# Patient Record
Sex: Female | Born: 1965
Health system: Southern US, Community
[De-identification: ages and names within clinical notes are randomized; demographics above are authoritative.]

## PROBLEM LIST (undated history)

## (undated) DIAGNOSIS — I7300 Raynaud's syndrome without gangrene: Secondary | ICD-10-CM

## (undated) DIAGNOSIS — F32A Depression, unspecified: Secondary | ICD-10-CM

## (undated) DIAGNOSIS — F329 Major depressive disorder, single episode, unspecified: Secondary | ICD-10-CM

## (undated) DIAGNOSIS — F419 Anxiety disorder, unspecified: Secondary | ICD-10-CM

## (undated) DIAGNOSIS — G970 Cerebrospinal fluid leak from spinal puncture: Secondary | ICD-10-CM

## (undated) DIAGNOSIS — M62838 Other muscle spasm: Secondary | ICD-10-CM

## (undated) DIAGNOSIS — IMO0002 Reserved for concepts with insufficient information to code with codable children: Secondary | ICD-10-CM

## (undated) DIAGNOSIS — K648 Other hemorrhoids: Secondary | ICD-10-CM

## (undated) DIAGNOSIS — M549 Dorsalgia, unspecified: Secondary | ICD-10-CM

## (undated) DIAGNOSIS — T7840XA Allergy, unspecified, initial encounter: Secondary | ICD-10-CM

## (undated) DIAGNOSIS — R202 Paresthesia of skin: Secondary | ICD-10-CM

## (undated) DIAGNOSIS — J189 Pneumonia, unspecified organism: Secondary | ICD-10-CM

## (undated) DIAGNOSIS — M2540 Effusion, unspecified joint: Secondary | ICD-10-CM

## (undated) DIAGNOSIS — R3915 Urgency of urination: Secondary | ICD-10-CM

## (undated) DIAGNOSIS — R011 Cardiac murmur, unspecified: Secondary | ICD-10-CM

## (undated) DIAGNOSIS — M1990 Unspecified osteoarthritis, unspecified site: Secondary | ICD-10-CM

## (undated) DIAGNOSIS — G8929 Other chronic pain: Secondary | ICD-10-CM

## (undated) DIAGNOSIS — N3010 Interstitial cystitis (chronic) without hematuria: Secondary | ICD-10-CM

## (undated) DIAGNOSIS — M797 Fibromyalgia: Secondary | ICD-10-CM

## (undated) DIAGNOSIS — C801 Malignant (primary) neoplasm, unspecified: Secondary | ICD-10-CM

## (undated) DIAGNOSIS — R51 Headache: Secondary | ICD-10-CM

## (undated) DIAGNOSIS — M2550 Pain in unspecified joint: Secondary | ICD-10-CM

## (undated) DIAGNOSIS — Z8709 Personal history of other diseases of the respiratory system: Secondary | ICD-10-CM

## (undated) DIAGNOSIS — R350 Frequency of micturition: Secondary | ICD-10-CM

## (undated) DIAGNOSIS — K219 Gastro-esophageal reflux disease without esophagitis: Secondary | ICD-10-CM

## (undated) DIAGNOSIS — M329 Systemic lupus erythematosus, unspecified: Secondary | ICD-10-CM

## (undated) DIAGNOSIS — R200 Anesthesia of skin: Secondary | ICD-10-CM

## (undated) DIAGNOSIS — K5900 Constipation, unspecified: Secondary | ICD-10-CM

## (undated) HISTORY — PX: ABDOMINAL HERNIA REPAIR: SHX539

## (undated) HISTORY — PX: SHOULDER SURGERY: SHX246

## (undated) HISTORY — DX: Raynaud's syndrome without gangrene: I73.00

## (undated) HISTORY — PX: DIAGNOSTIC LAPAROSCOPY: SUR761

## (undated) HISTORY — PX: APPENDECTOMY: SHX54

## (undated) HISTORY — PX: OTHER SURGICAL HISTORY: SHX169

## (undated) HISTORY — PX: OVARIAN CYST REMOVAL: SHX89

## (undated) HISTORY — PX: COLONOSCOPY: SHX174

## (undated) HISTORY — PX: TUBAL LIGATION: SHX77

## (undated) HISTORY — PX: VAGINAL PROLAPSE REPAIR: SHX830

## (undated) HISTORY — PX: TONSILLECTOMY: SUR1361

## (undated) HISTORY — PX: REPAIR OF RECTAL PROLAPSE: SHX6420

## (undated) HISTORY — DX: Allergy, unspecified, initial encounter: T78.40XA

## (undated) HISTORY — PX: CHOLECYSTECTOMY: SHX55

## (undated) HISTORY — PX: BLADDER SUSPENSION: SHX72

## (undated) HISTORY — PX: SPINAL FUSION: SHX223

## (undated) HISTORY — PX: ENDOMETRIAL ABLATION: SHX621

## (undated) HISTORY — PX: NASAL SEPTUM SURGERY: SHX37

---

## 1998-02-10 ENCOUNTER — Encounter: Admission: RE | Admit: 1998-02-10 | Discharge: 1998-02-10 | Payer: Self-pay | Admitting: Hematology and Oncology

## 2000-08-16 ENCOUNTER — Ambulatory Visit (HOSPITAL_BASED_OUTPATIENT_CLINIC_OR_DEPARTMENT_OTHER): Admission: RE | Admit: 2000-08-16 | Discharge: 2000-08-16 | Payer: Self-pay | Admitting: Orthopedic Surgery

## 2000-08-28 ENCOUNTER — Encounter: Admission: RE | Admit: 2000-08-28 | Discharge: 2000-11-02 | Payer: Self-pay | Admitting: Orthopedic Surgery

## 2001-02-15 ENCOUNTER — Emergency Department (HOSPITAL_COMMUNITY): Admission: EM | Admit: 2001-02-15 | Discharge: 2001-02-15 | Payer: Self-pay | Admitting: Emergency Medicine

## 2001-03-13 ENCOUNTER — Encounter (HOSPITAL_COMMUNITY): Admission: RE | Admit: 2001-03-13 | Discharge: 2001-04-12 | Payer: Self-pay | Admitting: Unknown Physician Specialty

## 2001-04-24 ENCOUNTER — Emergency Department (HOSPITAL_COMMUNITY): Admission: EM | Admit: 2001-04-24 | Discharge: 2001-04-24 | Payer: Self-pay | Admitting: Emergency Medicine

## 2001-07-24 ENCOUNTER — Emergency Department (HOSPITAL_COMMUNITY): Admission: EM | Admit: 2001-07-24 | Discharge: 2001-07-24 | Payer: Self-pay | Admitting: Internal Medicine

## 2001-07-24 ENCOUNTER — Encounter: Payer: Self-pay | Admitting: Emergency Medicine

## 2002-12-07 ENCOUNTER — Inpatient Hospital Stay (HOSPITAL_COMMUNITY): Admission: AD | Admit: 2002-12-07 | Discharge: 2002-12-07 | Payer: Self-pay | Admitting: *Deleted

## 2007-06-13 ENCOUNTER — Emergency Department (HOSPITAL_COMMUNITY): Admission: EM | Admit: 2007-06-13 | Discharge: 2007-06-13 | Payer: Self-pay | Admitting: Emergency Medicine

## 2007-11-26 ENCOUNTER — Emergency Department (HOSPITAL_COMMUNITY): Admission: EM | Admit: 2007-11-26 | Discharge: 2007-11-26 | Payer: Self-pay | Admitting: Emergency Medicine

## 2008-03-26 ENCOUNTER — Emergency Department (HOSPITAL_COMMUNITY): Admission: EM | Admit: 2008-03-26 | Discharge: 2008-03-26 | Payer: Self-pay | Admitting: Emergency Medicine

## 2008-04-24 ENCOUNTER — Emergency Department (HOSPITAL_BASED_OUTPATIENT_CLINIC_OR_DEPARTMENT_OTHER): Admission: EM | Admit: 2008-04-24 | Discharge: 2008-04-24 | Payer: Self-pay | Admitting: Emergency Medicine

## 2008-06-26 ENCOUNTER — Ambulatory Visit: Payer: Self-pay | Admitting: Gastroenterology

## 2008-07-04 ENCOUNTER — Encounter (HOSPITAL_COMMUNITY): Admission: RE | Admit: 2008-07-04 | Discharge: 2008-07-21 | Payer: Self-pay | Admitting: Gastroenterology

## 2008-08-06 ENCOUNTER — Ambulatory Visit (HOSPITAL_COMMUNITY): Admission: RE | Admit: 2008-08-06 | Discharge: 2008-08-06 | Payer: Self-pay | Admitting: Family Medicine

## 2008-08-25 ENCOUNTER — Encounter (INDEPENDENT_AMBULATORY_CARE_PROVIDER_SITE_OTHER): Payer: Self-pay | Admitting: Surgery

## 2008-08-25 ENCOUNTER — Ambulatory Visit (HOSPITAL_COMMUNITY): Admission: RE | Admit: 2008-08-25 | Discharge: 2008-08-25 | Payer: Self-pay | Admitting: Surgery

## 2008-08-26 ENCOUNTER — Encounter: Payer: Self-pay | Admitting: Gastroenterology

## 2008-08-26 DIAGNOSIS — K319 Disease of stomach and duodenum, unspecified: Secondary | ICD-10-CM | POA: Insufficient documentation

## 2008-09-11 ENCOUNTER — Ambulatory Visit: Payer: Self-pay | Admitting: Gastroenterology

## 2008-09-11 ENCOUNTER — Ambulatory Visit (HOSPITAL_COMMUNITY): Admission: RE | Admit: 2008-09-11 | Discharge: 2008-09-11 | Payer: Self-pay | Admitting: Gastroenterology

## 2008-10-21 ENCOUNTER — Encounter: Admission: RE | Admit: 2008-10-21 | Discharge: 2008-10-23 | Payer: Self-pay | Admitting: Neurosurgery

## 2008-10-24 ENCOUNTER — Encounter: Admission: RE | Admit: 2008-10-24 | Discharge: 2008-12-24 | Payer: Self-pay | Admitting: Neurosurgery

## 2008-11-03 ENCOUNTER — Encounter: Admission: RE | Admit: 2008-11-03 | Discharge: 2009-02-01 | Payer: Self-pay | Admitting: Anesthesiology

## 2008-11-04 ENCOUNTER — Ambulatory Visit: Payer: Self-pay | Admitting: Anesthesiology

## 2008-11-06 ENCOUNTER — Encounter: Payer: Self-pay | Admitting: Gastroenterology

## 2008-11-11 ENCOUNTER — Encounter: Admission: RE | Admit: 2008-11-11 | Discharge: 2008-11-11 | Payer: Self-pay | Admitting: Surgery

## 2008-11-19 ENCOUNTER — Encounter
Admission: RE | Admit: 2008-11-19 | Discharge: 2008-11-20 | Payer: Self-pay | Admitting: Physical Medicine & Rehabilitation

## 2008-11-20 ENCOUNTER — Ambulatory Visit: Payer: Self-pay | Admitting: Physical Medicine & Rehabilitation

## 2008-11-21 ENCOUNTER — Encounter: Payer: Self-pay | Admitting: Gastroenterology

## 2008-12-16 ENCOUNTER — Ambulatory Visit: Payer: Self-pay | Admitting: Anesthesiology

## 2009-02-19 ENCOUNTER — Encounter: Admission: RE | Admit: 2009-02-19 | Discharge: 2009-02-24 | Payer: Self-pay | Admitting: Anesthesiology

## 2009-02-24 ENCOUNTER — Ambulatory Visit: Payer: Self-pay | Admitting: Anesthesiology

## 2009-03-10 ENCOUNTER — Emergency Department (HOSPITAL_BASED_OUTPATIENT_CLINIC_OR_DEPARTMENT_OTHER): Admission: EM | Admit: 2009-03-10 | Discharge: 2009-03-10 | Payer: Self-pay | Admitting: Emergency Medicine

## 2009-03-15 ENCOUNTER — Emergency Department (HOSPITAL_BASED_OUTPATIENT_CLINIC_OR_DEPARTMENT_OTHER): Admission: EM | Admit: 2009-03-15 | Discharge: 2009-03-15 | Payer: Self-pay | Admitting: Emergency Medicine

## 2009-03-15 ENCOUNTER — Ambulatory Visit: Payer: Self-pay | Admitting: Radiology

## 2009-03-26 ENCOUNTER — Emergency Department (HOSPITAL_BASED_OUTPATIENT_CLINIC_OR_DEPARTMENT_OTHER): Admission: EM | Admit: 2009-03-26 | Discharge: 2009-03-26 | Payer: Self-pay | Admitting: Emergency Medicine

## 2009-04-10 ENCOUNTER — Encounter
Admission: RE | Admit: 2009-04-10 | Discharge: 2009-04-22 | Payer: Self-pay | Admitting: Physical Medicine and Rehabilitation

## 2009-04-13 ENCOUNTER — Ambulatory Visit: Payer: Self-pay | Admitting: Physical Medicine and Rehabilitation

## 2009-04-14 ENCOUNTER — Emergency Department (HOSPITAL_COMMUNITY): Admission: EM | Admit: 2009-04-14 | Discharge: 2009-04-14 | Payer: Self-pay | Admitting: Emergency Medicine

## 2009-04-22 ENCOUNTER — Ambulatory Visit: Payer: Self-pay | Admitting: Physical Medicine and Rehabilitation

## 2009-10-26 ENCOUNTER — Inpatient Hospital Stay (HOSPITAL_COMMUNITY): Admission: RE | Admit: 2009-10-26 | Discharge: 2009-10-31 | Payer: Self-pay | Admitting: Neurosurgery

## 2009-10-26 ENCOUNTER — Ambulatory Visit: Payer: Self-pay | Admitting: Vascular Surgery

## 2010-06-02 ENCOUNTER — Encounter: Admission: RE | Admit: 2010-06-02 | Discharge: 2010-07-26 | Payer: Self-pay | Admitting: Neurosurgery

## 2010-11-26 ENCOUNTER — Emergency Department (HOSPITAL_COMMUNITY)
Admission: EM | Admit: 2010-11-26 | Discharge: 2010-11-26 | Disposition: A | Discharge status: Home / Self Care | Payer: Medicaid Other | Attending: Emergency Medicine | Admitting: Emergency Medicine

## 2010-11-26 ENCOUNTER — Encounter (HOSPITAL_COMMUNITY): Payer: Self-pay | Admitting: Radiology

## 2010-11-26 ENCOUNTER — Emergency Department (HOSPITAL_COMMUNITY): Payer: Medicaid Other

## 2010-11-26 DIAGNOSIS — M545 Low back pain, unspecified: Secondary | ICD-10-CM | POA: Insufficient documentation

## 2010-11-26 DIAGNOSIS — F329 Major depressive disorder, single episode, unspecified: Secondary | ICD-10-CM | POA: Insufficient documentation

## 2010-11-26 DIAGNOSIS — M549 Dorsalgia, unspecified: Secondary | ICD-10-CM | POA: Insufficient documentation

## 2010-11-26 DIAGNOSIS — G8929 Other chronic pain: Secondary | ICD-10-CM | POA: Insufficient documentation

## 2010-11-26 DIAGNOSIS — F3289 Other specified depressive episodes: Secondary | ICD-10-CM | POA: Insufficient documentation

## 2010-11-26 MED ORDER — GADOBENATE DIMEGLUMINE 529 MG/ML IV SOLN: 15.0000 mL | Freq: Once | INTRAVENOUS | Status: DC | PRN

## 2011-01-09 LAB — DIFFERENTIAL
Basophils Absolute: 0 10*3/uL (ref 0.0–0.1)
Basophils Relative: 0 % (ref 0–1)
Eosinophils Absolute: 0 10*3/uL (ref 0.0–0.7)
Monocytes Relative: 11 % (ref 3–12)
Neutrophils Relative %: 75 % (ref 43–77)

## 2011-01-09 LAB — CBC
HCT: 28.3 % — ABNORMAL LOW (ref 36.0–46.0)
HCT: 28.5 % — ABNORMAL LOW (ref 36.0–46.0)
HCT: 29 % — ABNORMAL LOW (ref 36.0–46.0)
Hemoglobin: 10 g/dL — ABNORMAL LOW (ref 12.0–15.0)
Hemoglobin: 10.1 g/dL — ABNORMAL LOW (ref 12.0–15.0)
Hemoglobin: 9.7 g/dL — ABNORMAL LOW (ref 12.0–15.0)
MCHC: 34.1 g/dL (ref 30.0–36.0)
MCHC: 35.2 g/dL (ref 30.0–36.0)
Platelets: 219 10*3/uL (ref 150–400)
Platelets: 233 10*3/uL (ref 150–400)
RBC: 3.03 MIL/uL — ABNORMAL LOW (ref 3.87–5.11)
RBC: 3.04 MIL/uL — ABNORMAL LOW (ref 3.87–5.11)
RBC: 3.1 MIL/uL — ABNORMAL LOW (ref 3.87–5.11)
RDW: 12.6 % (ref 11.5–15.5)
RDW: 12.8 % (ref 11.5–15.5)
WBC: 9 10*3/uL (ref 4.0–10.5)
WBC: 9.3 10*3/uL (ref 4.0–10.5)

## 2011-01-09 LAB — URINE MICROSCOPIC-ADD ON

## 2011-01-09 LAB — URINALYSIS, ROUTINE W REFLEX MICROSCOPIC
Bilirubin Urine: NEGATIVE
Glucose, UA: NEGATIVE mg/dL
Specific Gravity, Urine: 1.015 (ref 1.005–1.030)
Urobilinogen, UA: 0.2 mg/dL (ref 0.0–1.0)
pH: 7.5 (ref 5.0–8.0)

## 2011-01-09 LAB — URINE CULTURE

## 2011-01-09 LAB — CULTURE, BLOOD (ROUTINE X 2): Culture: NO GROWTH

## 2011-01-09 LAB — BASIC METABOLIC PANEL
CO2: 31 mEq/L (ref 19–32)
Calcium: 8.1 mg/dL — ABNORMAL LOW (ref 8.4–10.5)
GFR calc non Af Amer: >60 mL/min (ref >60)
Sodium: 135 mEq/L (ref 135–145)

## 2011-01-12 ENCOUNTER — Ambulatory Visit: Payer: Medicaid Other | Attending: Orthopedic Surgery | Admitting: Physical Therapy

## 2011-01-12 DIAGNOSIS — R5381 Other malaise: Secondary | ICD-10-CM | POA: Insufficient documentation

## 2011-01-12 DIAGNOSIS — IMO0001 Reserved for inherently not codable concepts without codable children: Secondary | ICD-10-CM | POA: Insufficient documentation

## 2011-01-12 DIAGNOSIS — M6281 Muscle weakness (generalized): Secondary | ICD-10-CM | POA: Insufficient documentation

## 2011-01-12 DIAGNOSIS — M25519 Pain in unspecified shoulder: Secondary | ICD-10-CM | POA: Insufficient documentation

## 2011-01-20 ENCOUNTER — Ambulatory Visit: Payer: Medicaid Other | Attending: Orthopedic Surgery | Admitting: Physical Therapy

## 2011-01-20 DIAGNOSIS — M6281 Muscle weakness (generalized): Secondary | ICD-10-CM | POA: Insufficient documentation

## 2011-01-20 DIAGNOSIS — R5381 Other malaise: Secondary | ICD-10-CM | POA: Insufficient documentation

## 2011-01-20 DIAGNOSIS — M25519 Pain in unspecified shoulder: Secondary | ICD-10-CM | POA: Insufficient documentation

## 2011-01-20 DIAGNOSIS — IMO0001 Reserved for inherently not codable concepts without codable children: Secondary | ICD-10-CM | POA: Insufficient documentation

## 2011-01-24 ENCOUNTER — Ambulatory Visit: Payer: Medicaid Other | Admitting: Physical Therapy

## 2011-01-24 LAB — CBC
HCT: 39.3 % (ref 36.0–46.0)
Hemoglobin: 13.7 g/dL (ref 12.0–15.0)
MCHC: 35 g/dL (ref 30.0–36.0)
Platelets: 308 10*3/uL (ref 150–400)
RDW: 12.8 % (ref 11.5–15.5)

## 2011-01-24 LAB — BASIC METABOLIC PANEL
BUN: 4 mg/dL — ABNORMAL LOW (ref 6–23)
CO2: 28 mEq/L (ref 19–32)
Calcium: 9.6 mg/dL (ref 8.4–10.5)
Glucose, Bld: 79 mg/dL (ref 70–99)
Potassium: 4.5 mEq/L (ref 3.5–5.1)
Sodium: 137 mEq/L (ref 135–145)

## 2011-02-01 ENCOUNTER — Ambulatory Visit: Payer: Medicaid Other | Admitting: Physical Therapy

## 2011-02-22 ENCOUNTER — Other Ambulatory Visit: Payer: Self-pay | Admitting: Surgery

## 2011-02-22 ENCOUNTER — Encounter (HOSPITAL_COMMUNITY): Payer: Medicaid Other | Attending: Surgery

## 2011-02-22 DIAGNOSIS — Z79899 Other long term (current) drug therapy: Secondary | ICD-10-CM | POA: Insufficient documentation

## 2011-02-22 DIAGNOSIS — Z01812 Encounter for preprocedural laboratory examination: Secondary | ICD-10-CM | POA: Insufficient documentation

## 2011-02-22 DIAGNOSIS — K439 Ventral hernia without obstruction or gangrene: Secondary | ICD-10-CM | POA: Insufficient documentation

## 2011-02-22 LAB — SURGICAL PCR SCREEN: Staphylococcus aureus: INVALID — AB

## 2011-02-25 LAB — MRSA CULTURE

## 2011-03-02 ENCOUNTER — Ambulatory Visit (HOSPITAL_COMMUNITY)
Admission: RE | Admit: 2011-03-02 | Discharge: 2011-03-02 | Disposition: A | Discharge status: Home / Self Care | Payer: Medicaid Other | Source: Ambulatory Visit | Attending: Surgery | Admitting: Surgery

## 2011-03-02 DIAGNOSIS — K432 Incisional hernia without obstruction or gangrene: Secondary | ICD-10-CM | POA: Insufficient documentation

## 2011-03-02 DIAGNOSIS — M549 Dorsalgia, unspecified: Secondary | ICD-10-CM | POA: Insufficient documentation

## 2011-03-02 DIAGNOSIS — G8929 Other chronic pain: Secondary | ICD-10-CM | POA: Insufficient documentation

## 2011-03-04 NOTE — Op Note (Signed)
  [name], [name]                ACCOUNT NO.:  [id number]  MEDICAL RECORD NO.:  [id number]           PATIENT TYPE:  O  LOCATION:  DAYL                         FACILITY:  [facility]  PHYSICIAN:  Thornton Park. Daphine Deutscher, MD  DATE OF BIRTH:  10-24-1966  DATE OF PROCEDURE:  03/02/2011 DATE OF DISCHARGE:  03/02/2011                              OPERATIVE REPORT   PREOPERATIVE DIAGNOSIS:  Upper midline trocar site hernia from previous lap chole.  PROCEDURE:  Laparoscopic assisted ventral hernia repair using Proceed ventral patch.  OPERATIVE FINDINGS:  A 1-cm trocar site hernia with balloon mushroom type sac containing properitoneal fat.  Inspection of the periduodenal region which I had seen some whitish discoloration before was reinspected and no abnormalities were noted.  PROCEDURE:  Lap-assisted ventral repair.  SURGEON:  Thornton Park. Daphine Deutscher, MD  ASSISTANT:  None.  ANESTHESIA:  General.  DESCRIPTION OF PROCEDURE:  Maureen [name] was taken to room #1 on the afternoon of Mar 02, 2011, and given general anesthesia.  The abdomen was prepped with PCMX and draped sterilely.  Time-out was performed.  Access to the abdomen was achieved using a 5-mm 0-degree Optiview entering without difficulty.  Insufflation was performed and angle degree 5 mm scope was used.  Two ties were placed in lower abdomen and these assisted me in grasping the hernia sac and reducing into the abdomen. With that, I elected to go ahead and debulked this by kind of pulling some of the herniated fat material out.  Once this was done, it did demarcate a small opening probably about 1 cm in diameter.  I went ahead and took the smallest Proceed patch and excised the old little trocar incision.  I then dissected free the hernia sac revealing the little slit in the fascia.  Through that I went ahead and inserted the Proceed patch and sutured it around with 5-0 Prolene pop-offs to the surrounding fascia.  I then went in with the  secured strap tacker and tacked the periphery of this patch up to the anterior abdominal wall.  The patch looked good in its position and was completely deployed to more than adequately cover this defect.  Wounds were injected with Marcaine and closed with 4-0 Vicryl and Dermabond.  The patient seemed to tolerate the procedure well.  She was given Percocet for pain and will follow up in the office in about 3 weeks.     Thornton Park Daphine Deutscher, MD     MBM/MEDQ  D:  03/02/2011  T:  03/02/2011  Job:  [id number]  Electronically Signed by Luretha Murphy MD on 03/04/2011 08:36:25 AM
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
## 2011-03-08 NOTE — Group Therapy Note (Signed)
PATIENT:  Graylin Shiver   DATE OF BIRTH:  July 10, 1966   DATE:   SURGEON:  Jewel Baize. Stevphen Rochester, M.D.   [facility] and I  evaluated her.  I reviewed the Health and History form.  I reviewed the  14-point review of systems.   A 45 year old complaining of low back pain, referred to [place] for  management of low back, right leg pain.  Describes no specific onset, or  traumatic event, but has had escalating pain over the past number of  months.  She has seen our neurosurgical colleagues and states  nonsurgical, ask for conservative and interventional management  approach.  On subjective scale, 6/10.  Interval obs with her restorative  sleep capacity and most of her functional activities.  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  She  walks without assistance, does not divulge any bowel or bladder  disorder, difficulty with most endurance activities, however.  She has  children at home, and this interferes with her capacity to work with  them as well.   Fourteen-point review of systems, some escalating weight gain, GERD.   CURRENT MEDICATIONS:   ALLERGIES:  States no wish to harm self or others.   PSH:  Noncontributory.   PMH:  Otherwise unremarkable.   FAMILY HISTORY:  Remarkable for hypertension.  Denies chemical alcohol  dependency.   SOCIAL HISTORY:  She states she is a previous smoker, recently quit.   PHYSICAL EXAM:  Reveals a pleasant overweight female sitting comfortably  in bed with normal vital signs.  Gait, slight limp.  Oriented times  three.  Affect and appearance is normal.  HEENT:  Unremarkable.  CHEST:  Clear to auscultation and percussion.  Increased AP diameter.  CARDIAC:  Regular rate and rhythm without rub, murmur or gallop.  ABDOMINAL EXAM:  Soft, nontender, benign, no hepatosplenomegaly.  MUSCULOSKELETAL:  Diffuse paralumbar myofascial discomfort with modified  Gaenslen's and Patrick's positive particularly to the right.  Pain with  extension.  No EHL give way, and she has an intact neurological  assessment.   IMPRESSION:  Degenerative spine disease, lumbar spine with facet  syndrome.  Spondylitic pain.  MRI reviewed.   PROCEDURE:   PLAN:  1. Recommend facet medial branch intervention with RF as an option.      Bench mark should be obtained, reviewed with the patient, I talked      about her Dr. Wynn Banker about going forward this block as she      apparently has some limited weeks left on her insurance and I do      think this will help her.  Level of interrupted activities of daily      living warrants this as we do not want to escalate controlled      substances.  Particularly as she is in school, takes care of young      children.  2. To this end, I am going to switch her to hydrocodone [place] and      medications attached chart will go to Ultram ER and I will review      that with her.  3. Follow up with her for this block.  I have used models discussed in      lay terms.  Risks, complications, and options were fully outlined.  Questions were answered.           ______________________________  Celene Kras, MD     HH/MedQ  D:  11/04/2008 10:24:43  T:  11/04/2008 23:35:32  Job #:  [id number]

## 2011-03-08 NOTE — Assessment & Plan Note (Signed)
[name]  I evaluated  her and reviewed the health and history form and 14-point review of  systems.   Maureen [name] has had a facet intervention with a number of days of relief  cycling, but has had recrudescence.  I think it is probably reasonable  to consider her for another intervention, but she started having more  leg pain, somewhat radicular in nature, and so I think that I will do a  trial of translaminar intervention.  Also, the intragluteal pain, exam,  and historical features suggest that side.  She has ran out of insurance  and notices she is going to have to go to cash, so we will work with it.  I have discussed treatment options with her.   OBJECTIVE:  Diffuse paralumbar myofascial discomfort.  Fortin test is  positive with side bending.  Modified gaze and Patrick's positive, left  greater than right.  A mixed radicular spondylitic pain, SI overlay.   IMPRESSION:  Degenerative spine disease, lumbar spine; spondylitic joint  pain.   PLAN:  1. I will send her to the office and we will do a lumbar epidural as      consideration to reduce her cost.  2. Other modifiable features in health profile, weight control, I have      reviewed with her, and I talked to her also about other      consideration in this regard.  3. She is getting ready to take her test in [employer] and she states      that she really wants to pass and get into some type of work.  We      will do what we can to work with her.  I have reviewed medications,      options in this regard, no barrier to communication.  Opioid      consent, the patient's care agreement reviewed.  I will go ahead      and give her some hydrocodone, I wondered to emphasize NSAIDs      during the day, hydrocodone if necessary.   We will see her in followup.  Discharge instructions given.           ______________________________  Celene Kras, MD     HH/MedQ  D:  12/16/2008  13:29:34  T:  12/17/2008 01:14:00  Job #:  [id number]

## 2011-03-08 NOTE — Consult Note (Signed)
Maureen [name], Maureen [name]                ACCOUNT NO.:  [id number]   MEDICAL RECORD NO.:  [id number]          PATIENT TYPE:  AMB   LOCATION:  DAY                           FACILITY:  [facility]   PHYSICIAN:  Kassie Mends, M.D.      DATE OF BIRTH:  Feb 22, 1966   DATE OF CONSULTATION:  06/26/2008  DATE OF DISCHARGE:                                 CONSULTATION   REASON FOR CONSULTATION:  Abdominal pain and constipation.   PHYSICIAN REQUESTING CONSULTATION:  Ernestina Penna, MD, [facility].   PHYSICIAN CONSULTING:  Kassie Mends, MD.   HISTORY OF PRESENT ILLNESS:  The patient is a pleasant 45 year old lady  who presents today for further evaluation of constipation and abdominal  pain.  She states she has been having chronic constipation for years.  She has really been having abdominal pain since February of this year.  She was then seen in the emergency department 3 times for this pain.  Pain is in right upper quadrant region.  Does radiate down to the right  mid-abdomen.  Often associated with food, but she notes it is worse when  she takes a laxative or when she has a bowel movement.  It seems to be  worse with stress as well.  She also has a lot of abdominal cramping  whenever she takes a laxative to have a bowel movement.  She has chronic  constipation.  She generally has a bowel movement once a week.  She  takes ex-lax once a week.  She has tried MiraLax and stool softeners  before.  She states she has tried fiber supplements, but she has too  much bloating and discomfort related to this.  She had been using  ibuprofen and Tylenol as an outpatient, but she has now stopped this.  She is concerned the ibuprofen may be messing her stomach up.  She  denies any  nausea or vomiting.  She has had some epigastric burning,  but this is better on Protonix.  She denies any heartburn.  No  dysphagia, odynophagia, or weight loss.  No dysuria or hematuria.  She  has had some very heavy  menses.  She is due to see a gynecologist in the  near future.  In February, she had an abdominal ultrasound, which was  negative.  In June, when she presented to the emergency department, she  had an abdominal ultrasound and a CT of the abdomen and pelvis both were  negative.  Lab work consists of MET-7, CMET, and CBC all normal on April 24, 2008.  Urinalysis negative as well.  Lab test was normal in June.  She also had a normal chest x-ray as well.   CURRENT MEDICATIONS:  1. Prozac 40 mg daily.  2. Xanax 0.5 mg daily.  3. Protonix 40 mg daily.   ALLERGIES:  PENICILLIN causes rash.   PAST MEDICAL HISTORY:  Chronic constipation and depression.   PAST SURGICAL HISTORY:  Cesarean section x2, appendectomy surgery for  adhesions, ovarian cystostomy, and tonsillectomy.   FAMILY HISTORY:  Mother has history of colon  polyps.  Maternal  grandmother has breast cancer and history of colon polyps.   SOCIAL HISTORY:  She is married.  She has 3 children, a daughter age 31  and twin daughters age 31.  She is a Physicist, medical, IT trainer.  She quit smoking 5 months ago.  No alcohol use.   REVIEW OF SYSTEMS:  GI:  See HPI.  CONSTITUTIONAL:  Complains of weight  gain.  CARDIOPULMONARY:  Denies chest pain, shortness of breath,  palpitations, or cough.  GENITOURINARY:  Complains of heavy menses.  GYN:  Appointment scheduled.  No dysuria or hematuria.   PHYSICAL EXAMINATION:  VITAL SIGNS:  Weight 165, height 5 feet 3 inches,  temp 98.6, blood pressure 100/72, and pulse 64.  GENERAL:  A pleasant, well-nourished, well-developed Caucasian female in  no acute distress.  SKIN:  Warm and dry.  No jaundice.  HEENT:  Sclera nonicteric.  Oropharyngeal mucosa moist and pink.  No  lesions, erythema, or exudate.  No lymphadenopathy or thyromegaly.  CHEST:  Lungs are clear to auscultation.  CARDIAC:  Regular rate and rhythm.  Normal S1, S2.  No murmurs, rubs, or  gallops.  ABDOMEN:  Positive bowel  sound.  Abdomen is soft and nondistended.  She  has mild-to-moderate tenderness in the epigastrium and right upper  quadrant region.  She has a positive Murphy sign with increased pain  with inspiration.  No rebound.  No abdominal bruits or hernia.  LOWER EXTREMITIES:  No edema.   IMPRESSION:  The patient is a pleasant 45 year old lady with 25-month  history of right upper quadrant abdominal pain and chronic constipation.  She also has epigastric pain.  Two abdominal ultrasounds have been  negative for acute cholecystitis or gallstones.  LFTs in July were  normal.  She has significant pain on exam today in the right upper  quadrant region.  We need to rule out the possibility of biliary  dyskinesia or chronic cholecystitis.  Differential also includes  possibly a peptic ulcer disease.  She has chronic constipation with  reported failure to over-the-counter stool softeners.  We will giver her  a trial on Amitiza.   PLAN:  1. HIDA scan with fatty meal challenge.  2. High-fiber diet.  3. Drink at least 64 ounces of water daily.  4. Amitiza 8 mcg p.o. b.i.d. with food, #27.  5. Vicodin 5/500 mg 1 p.o. q.6 h. p.r.n. severe pain, #15, 0 refills.  6. If HIDA scan is unremarkable, we would consider possibility of EGD      as a next step.   ADDENDUM 102009:  HIDA c/w biliary dyskinesia. Consider cholecystectomy.      Tana Coast, P.A.      Kassie Mends, M.D.  Electronically Signed    LL/MEDQ  D:  06/26/2008  T:  06/27/2008  Job:  [id number]   cc:   Ernestina Penna, M.D.  Fax: [phone]

## 2011-03-08 NOTE — Assessment & Plan Note (Signed)
Maureen [name] [facility]  I evaluated Maureen [name]  via health and history form 14-point review of systems.   Maureen [name] comes to [place] today complaining of:  1. Multiple myofascial overlay, paralumbar discomfort.  2. 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  I see no reason Maureen [name] cannot look forward to a very positive      gainful employment experience.  3. Modifiable features in health profile, weight control, and home-      based therapy.  4. Maureen [name] is using 2 hydrocodone at a time and that is against prescribed      limits, and I have counseled Maureen [name].  I have gone over the opioid      consent and the patient's care agreement.  I do actually think an      agent shift is probably in Maureen [name] best interest as a pharmacokinetic      long-acting agent might smooth Maureen [name] out.  Maureen [name] has to pay cash for      things and I think Ultram ER will be good choice for [place] as there is      a generic alternative here.  5. This would also help [place] minimize potential escalation-controlled      substances, which we really want to avoid, that is in Maureen [name] best      interest.  6. I also talked to Maureen [name] about treating in a multimodality since I have      offered Maureen [name] interventions in the like and Maureen [name] does not want those.      From a symptom management standpoint, we will follow Maureen [name]      expectantly.   OBJECTIVE:  Diffuse paralumbar myofascial pain, Horton's test positive,  side bending range of motion, impaired secondary to pain.  I did not see  anything new neurologically.   IMPRESSION:  Degenerative spine disease, lumbar spine.   PLAN:  Conservative management.           ______________________________  Celene Kras, MD     HH/MedQ  D:  02/24/2009 10:16:59  T:  02/24/2009 16:10:96  Job #:  [id number]

## 2011-03-08 NOTE — Op Note (Signed)
**Note Maureen via Obfuscation** [name], GLANZ                ACCOUNT NO.:  [id number]   MEDICAL RECORD NO.:  [id number]          PATIENT TYPE:  AMB   LOCATION:  DAY                          [facility]ACILITY[facility]   PHYSICIAN:  Thornton Park. Daphine Deutscher, MD  DATE OF BIRTH:  02-14-1966   DATE OF PROCEDURE:  08/25/2008  DATE OF DISCHARGE:                               OPERATIVE REPORT   PREOPERATIVE DIAGNOSIS:  Biliary dyskinesia.   POSTOPERATIVE DIAGNOSIS:  Biliary dyskinesia.   PROCEDURE:  Laparoscopic cholecystectomy with intraoperative  cholangiogram.   SURGEON:  Thornton Park.  Daphine Deutscher, M.D.   ASSISTANT:  None.   ANESTHESIA:  General endotracheal.   DESCRIPTION OF PROCEDURE:  Maureen [name] is a 45 year old white female  taken to room 6 on Monday, August 25, 2008 and given general  anesthesia.  The abdomen was prepped with chlorhexidine and draped  sterilely.  I excised her previous transverse incision and the umbilicus  and went down and did a Hassan technique without difficulty.  The  abdomen was insufflated.  Three trocars were placed in the upper  abdomen.  In doing this twice, she blocked down to a heart rate of 25  and received atropine.  The gallbladder was grasped, elevated and I had  dissected free Calot's triangle carefully getting a critical view.  The  cystic artery actually came anteriorly on top of the gallbladder, but I  separated it away and then clipped it proximally.  I did not divide it,  however.  I went ahead and put a clip upon the gallbladder and incised  the cystic duct, inserted a Reddick catheter, and took a dynamic  cholangiogram which showed good filling of the common duct with  intrahepatic radicals and free flow into the duodenum.  The cystic duct  was then triple clipped and divided.  The cystic artery was then  completely triple clipped and divided and the gallbladder was removed  from the gallbladder bed.  I did enter it up near the fundus spilling a  little bit of bile, but controlled  that and then I brought the  gallbladder out with the help of a bag through the umbilicus.  I went  back and irrigated with a total of 2 liters of saline until again it was  clear.  I also looked in the pelvis and did not see anything else there.   In the course of doing the dissection, I did notice that along the  periduodenal region there was a white plaque and it was somewhat sort of  elevated right at the lateral duodenal mesenteric junction.  I  photographed that and the copy is in the chart.  I am not certain of the  nature of this, but felt that I would not try to incise that or create a  potential complication, but would look at this first endoscopically to  make sure she had a full upper GI endoscopic workup before looking at  this area in light of her pain syndrome.  Furthermore, it would be  interesting to see how she does after the lap cholecystectomy to see if  this does relieve her pain.   At the end, everything appeared to be in order.  No bleeding or bile  leaks were noted.  I injected the port sites with half percent Marcaine  and closed the umbilicus with 0-Vicryl and then removed the trocars.  The wounds were closed with 4-0 Vicryl, Benzoin and Steri-Strips.  The  patient tolerated the procedure well and was taken to the recovery room  satisfactory condition.      Thornton Park Daphine Deutscher, MD  Electronically Signed     MBM/MEDQ  D:  08/25/2008  T:  08/25/2008  Job:  [id number]   cc:   [facility] [place]

## 2011-03-08 NOTE — Procedures (Signed)
Maureen [name], Maureen [name]                ACCOUNT NO.:  [id number]   MEDICAL RECORD NO.:  [id number]           PATIENT TYPE:   LOCATION:                                 FACILITY:   PHYSICIAN:  Erick Colace, M.D.DATE OF BIRTH:  1966-05-25   DATE OF PROCEDURE:  11/20/2008  DATE OF DISCHARGE:                               OPERATIVE REPORT   PROCEDURES:  This is a bilateral L5 dorsal ramus injection, bilateral L4  medial branch block, bilateral L3 medial branch block under fluoroscopic  guidance.   INDICATION:  Lumbar pain, axial which interferes with self-care  mobility, only partial response to medication management including  narcotic analgesic medications.   The patient was placed prone on fluoroscopy table.   Informed consent was obtained after describing risks and benefits of the  procedure with the patient.  These include bleeding, bruising,  infection, temporary, or permanent paralysis.  She elects to proceed and  has given written consent.  The patient was placed prone on fluoroscopy  table.  Betadine prep, sterile drape, 25-gauge 1-1/2 inch needle was  used to anesthetize the skin and subcu tissue with 1% lidocaine x2 mL.  Then, 22 gauge 3-1/2 inch spinal needle was inserted, first starting the  left S1 SAP sacral ala junction bone contact made, confirmed with  lateral imaging.  Omnipaque 180 under live fluoro demonstrated no  intravascular uptake and 0.5 mL of solution containing 1 mL of 4 mg/mL  dexamethasone and 2 mL of 2% MPF lidocaine were injected.  Then, left L5  SAP transverse process junction targeted bone contact made, confirmed  with lateral imaging.  Omnipaque 180 x 0.5 mL demonstrated no  intravascular uptake.  Then, 0.5 mL of dexamethasone lidocaine solution  was injected.  Then, left L4 SAP transverse process junction targeted  bone contact made.  Omnipaque 180 x 0.5 mg demonstrated no intravascular  uptake.  Then, 0.5 mL of dexamethasone lidocaine was  solution injected.  Same procedure was repeated on the right side with corresponding levels,  same injectate technique and needle.  The patient tolerated the  procedure well.  Pre- and post-injection vitals stable.  Post-injections  structures given.      Erick Colace, M.D.  Electronically Signed     AEK/MEDQ  D:  11/20/2008 10:18:13  T:  11/20/2008 22:56:12  Job:  [id number]

## 2011-03-08 NOTE — Assessment & Plan Note (Signed)
[name] is a 45 year old woman, who was referred by Dr. Stevphen [name]  and was first seen by me on April 13, 2009.  She is back in today for a  brief recheck and review of her lumbar MRI results from an MRI, which  was done on April 14, 2009.   She states her average pain is about 8 on a scale of 10 rather constant  in nature, predominantly localized to the lumbosacral junction,  radiating to the right lateral thigh.  Pain is worse with walking,  bending, sitting, standing, and improves with rest, heat therapy, and  medications, reports overall poor sleep.   FUNCTIONAL STATUS:  She can walk 10 minutes at a time.  She can climb  stairs.  She is driving.  She is going to school.  She is independent  with self-care.   Recently seen in the emergency room for low back pain on the April 14, 2009, was given Percocet in the emergency room for back pain.   She does have an appointment Dr. Lovell [name] or some complaints of  cervicalgia, which she mentioned at the last visit as well.  However,  cervicalgia is not her main complaint today in our clinic.  Her main  complaint is low back pain and right lateral hip pain.   REVIEW OF SYSTEMS:  Negative.   No other changes in past medical, social, or family history since her  last visit on April 13, 1009.   Medications provided through this clinic include:  1. Neurontin 300 mg, titrating up to 4 times a day.  2. Tramadol 50 mg one-half to one tablet t.i.d.   PHYSICAL EXAMINATION:  VITAL SIGNS:  Today blood pressure is 141/67,  pulse 81, respirations 18, and 100% saturated on room air.  GENERAL:  She is a mildly obese female, who does not appear in any  distress.  She is oriented x3.  Speech is clear.  Affect is bright.  She  is alert, cooperative, and pleasant.  However, became quite irritable  after discussion on her urine drug screen.  NEUROLOGIC:  Cranial nerves are intact.  Coordination is intact.  Reflexes are 2+ in bilateral upper and lower  extremities.  No abnormal  tone is noted.  No clonus is noted.  No tremors are appreciated.   Her motor strength in the lower extremities is in the 5/5 range with  exception of slightly decreased EHL on the right compared to the left.  She reports sensory deficits in the right lateral thigh.   Her straight leg raise is negative.   Transitioning from sitting to standing is done with the slightly  antalgic gait.  Limitations in lumbar extension and forward flexion are  fairly well-preserved with the complaints of pain at end range.  Cervical range of motion is mildly limited.  Full shoulder range of  motion is appreciated.   IMPRESSION:  Lumbar MRIs reviewed with her, noted to have a small  hemangioma at L1.  These were compared with MRI from August 06, 2008.  She had disk space narrowing at L5-S1, vacuum phenomenon at that level,  mild disk bulge, end plate spurring, bilateral foraminal narrowing  unchanged.  Central canal is widely patent.  No nerve root compression  identified.  At L4-5, minimal disk bulging, mild facet degenerative  change, central canal, and neuro foraminal widely patent at that level  as well.   Reviewed MRI with her, answered the questions regarding the MRI.   IMPRESSION:  1. Lumbago with mild degenerative disk disease at L5-S1 and L4-5 as      well as mild facet changes at the same levels.  2. History of cervicalgia.  The patient will follow up with Dr.      Lovell [name] on April 24, 2009, which is in 2 days.  3. Complaints of numbness in right lower extremity, consider elective      diagnostic studies.   PLAN:  I have reviewed the patient's MRI results with her.  She also  mentioned that the medication she currently on is not particularly  helpful in managing her pain.  She was requesting stronger pain  medications.  Urine drug screen was also reviewed with her, which showed  an inconsistent result showing evidence, that she has been taking on  propoxyphene.   She states it was from her mother where she got the  medication from.  We had also done a [facility] check,  which did not show any of her prescribers giving a prescription for  propoxyphene.   Once I reviewed her urine drug screen with her and a medicine to her  that this was inconsistent and against to our controlled substance  agreement, she became quite upset and used some vulgar language and told  me she was not coming back to our clinic.  I indicated to the front desk  that she had asked for discharge.           ______________________________  Brantley Stage, M.D.     DMK/MedQ  D:  04/22/2009 12:39:06  T:  04/23/2009 03:15:12  Job #:  [id number]

## 2011-04-08 ENCOUNTER — Encounter (INDEPENDENT_AMBULATORY_CARE_PROVIDER_SITE_OTHER): Payer: Self-pay | Admitting: Surgery

## 2011-04-29 ENCOUNTER — Encounter (INDEPENDENT_AMBULATORY_CARE_PROVIDER_SITE_OTHER): Payer: Medicaid Other | Admitting: Surgery

## 2011-05-25 HISTORY — PX: VAGINAL HYSTERECTOMY: SUR661

## 2011-05-25 HISTORY — PX: TOTAL VAGINAL HYSTERECTOMY: SHX2548

## 2011-07-15 LAB — CBC
HCT: 36
Hemoglobin: 12.4
MCHC: 34.5
MCV: 92.9
RDW: 12.7

## 2011-07-15 LAB — COMPREHENSIVE METABOLIC PANEL
Alkaline Phosphatase: 38 — ABNORMAL LOW
BUN: 8
Calcium: 9.2
Creatinine, Ser: 0.67
Glucose, Bld: 90
Potassium: 4.1
Total Protein: 6.4

## 2011-07-15 LAB — DIFFERENTIAL
Basophils Absolute: 0
Basophils Relative: 0
Monocytes Relative: 7
Neutro Abs: 3.1
Neutrophils Relative %: 58

## 2011-07-15 LAB — URINALYSIS, ROUTINE W REFLEX MICROSCOPIC
Bilirubin Urine: NEGATIVE
Glucose, UA: NEGATIVE
Hgb urine dipstick: NEGATIVE
Ketones, ur: NEGATIVE
pH: 7.5

## 2011-07-21 LAB — URINALYSIS, ROUTINE W REFLEX MICROSCOPIC
Bilirubin Urine: NEGATIVE
Glucose, UA: NEGATIVE
Glucose, UA: NEGATIVE
Hgb urine dipstick: NEGATIVE
Ketones, ur: NEGATIVE
Nitrite: NEGATIVE
Protein, ur: NEGATIVE
Protein, ur: NEGATIVE
Specific Gravity, Urine: 1.004 — ABNORMAL LOW
Urobilinogen, UA: 0.2
Urobilinogen, UA: 0.2
pH: 7

## 2011-07-21 LAB — COMPREHENSIVE METABOLIC PANEL
ALT: 22
AST: 29
Albumin: 3.9
Albumin: 4.6
Alkaline Phosphatase: 49
BUN: 6
BUN: 11
CO2: 29
Calcium: 9.7
Chloride: 101
Creatinine, Ser: 0.69
Creatinine, Ser: 0.7
GFR calc Af Amer: >60
GFR calc non Af Amer: >60
Glucose, Bld: 79
Potassium: 4.2
Sodium: 140
Total Bilirubin: 0.3
Total Protein: 6.5
Total Protein: 7.9

## 2011-07-21 LAB — DIFFERENTIAL
Basophils Absolute: 0
Basophils Relative: 1
Eosinophils Absolute: 0.1
Eosinophils Absolute: 0.1
Eosinophils Relative: 1
Eosinophils Relative: 1
Lymphocytes Relative: 30
Lymphs Abs: 2
Lymphs Abs: 1.8
Monocytes Absolute: 0.4
Monocytes Relative: 7
Monocytes Relative: 7
Neutro Abs: 3.7
Neutrophils Relative %: 62

## 2011-07-21 LAB — PREGNANCY, URINE
Preg Test, Ur: NEGATIVE
Preg Test, Ur: NEGATIVE

## 2011-07-21 LAB — CBC
HCT: 37.2
HCT: 39.5
Hemoglobin: 13.8
MCHC: 35
MCV: 93.1
MCV: 91
Platelets: 324
RBC: 4
RBC: 4.34
RDW: 11.7
WBC: 5.3
WBC: 6

## 2011-07-26 LAB — PREGNANCY, URINE: Preg Test, Ur: NEGATIVE

## 2011-07-26 LAB — HEMOGLOBIN AND HEMATOCRIT, BLOOD: Hemoglobin: 12.5

## 2011-08-23 ENCOUNTER — Encounter (HOSPITAL_BASED_OUTPATIENT_CLINIC_OR_DEPARTMENT_OTHER): Payer: Self-pay | Admitting: *Deleted

## 2011-08-23 ENCOUNTER — Emergency Department (INDEPENDENT_AMBULATORY_CARE_PROVIDER_SITE_OTHER): Payer: Medicaid Other

## 2011-08-23 ENCOUNTER — Emergency Department (HOSPITAL_BASED_OUTPATIENT_CLINIC_OR_DEPARTMENT_OTHER)
Admission: EM | Admit: 2011-08-23 | Discharge: 2011-08-23 | Disposition: A | Discharge status: Home / Self Care | Payer: Medicaid Other | Attending: Emergency Medicine | Admitting: Emergency Medicine

## 2011-08-23 DIAGNOSIS — K589 Irritable bowel syndrome without diarrhea: Secondary | ICD-10-CM | POA: Insufficient documentation

## 2011-08-23 DIAGNOSIS — R209 Unspecified disturbances of skin sensation: Secondary | ICD-10-CM | POA: Insufficient documentation

## 2011-08-23 DIAGNOSIS — M79609 Pain in unspecified limb: Secondary | ICD-10-CM

## 2011-08-23 DIAGNOSIS — M549 Dorsalgia, unspecified: Secondary | ICD-10-CM | POA: Insufficient documentation

## 2011-08-23 DIAGNOSIS — F341 Dysthymic disorder: Secondary | ICD-10-CM | POA: Insufficient documentation

## 2011-08-23 DIAGNOSIS — M545 Low back pain, unspecified: Secondary | ICD-10-CM

## 2011-08-23 DIAGNOSIS — M533 Sacrococcygeal disorders, not elsewhere classified: Secondary | ICD-10-CM

## 2011-08-23 DIAGNOSIS — Z79899 Other long term (current) drug therapy: Secondary | ICD-10-CM | POA: Insufficient documentation

## 2011-08-23 HISTORY — DX: Major depressive disorder, single episode, unspecified: F32.9

## 2011-08-23 HISTORY — DX: Depression, unspecified: F32.A

## 2011-08-23 HISTORY — DX: Anxiety disorder, unspecified: F41.9

## 2011-08-23 MED ORDER — OXYCODONE-ACETAMINOPHEN 5-325 MG PO TABS: 2.0000 | ORAL_TABLET | ORAL | Status: AC | PRN

## 2011-08-23 MED ORDER — HYDROMORPHONE HCL 1 MG/ML IJ SOLN
2.0000 mg | Freq: Once | INTRAMUSCULAR | Status: AC
  Administered 2011-08-23: 2 mg via INTRAMUSCULAR
  Filled 2011-08-23 (×2): qty 1

## 2011-08-23 MED ORDER — ONDANSETRON HCL 4 MG/2ML IJ SOLN
4.0000 mg | Freq: Once | INTRAMUSCULAR | Status: AC
  Administered 2011-08-23: 4 mg via INTRAMUSCULAR
  Filled 2011-08-23: qty 2

## 2011-08-23 NOTE — ED Provider Notes (Signed)
Medical screening examination/treatment/procedure(s) were performed by non-physician practitioner and as supervising physician I was immediately available for consultation/collaboration.  No concern for epidural abscess as patient with no new neuro sx, fever or pain worse than before injection  Dayton Bailiff, MD 08/23/11 2201

## 2011-08-23 NOTE — ED Provider Notes (Addendum)
History     CSN: [id number] Arrival date & time: 08/23/2011  7:23 PM   First MD Initiated Contact with Patient 08/23/11 2019      Chief Complaint  Patient presents with  . Back Pain    (Consider location/radiation/quality/duration/timing/severity/associated sxs/prior treatment) Patient is a 44 y.o. female presenting with back pain.  Back Pain  This is a chronic problem. The current episode started more than 1 week ago. The problem occurs constantly. The problem has been gradually worsening. The pain is associated with no known injury. The pain is present in the lumbar spine. The quality of the pain is described as stabbing, shooting and aching. The pain is at a severity of 10/10. The pain is severe. The symptoms are aggravated by certain positions. The pain is the same all the time. Stiffness is present all day. Associated symptoms include numbness, paresthesias and paresis. Pertinent negatives include no chest pain, no fever, no weight loss, no bowel incontinence, no perianal numbness and no weakness.  Pt had a lumbar fusion done by Dr. Lovell Sheehan.  Pt complains that she is having increased pain.  Pt is suppose to see Dr. Lovell Sheehan in December.   Pt reports she has had increasing problems since March.  Pt reports she had an MRI then and has been seeing Md in [place] for injections.  Past Medical History  Diagnosis Date  . Irritable bowel syndrome (IBS)   . Abdominal pain   . Fusion of spine Jan 2011    L4-S1 fusion  . Depression   . Anxiety     Past Surgical History  Procedure Date  . Spinal fusion jan 2011    L4-S1 fusion  . Abdominal hernia repair   . Ablation of spine   . Abdominal hysterectomy   . Cholecystectomy   . Uterine ablation     No family history on file.  History  Substance Use Topics  . Smoking status: Former Games developer  . Smokeless tobacco: Not on file  . Alcohol Use: No    OB History    Grav Para Term Preterm Abortions TAB SAB Ect Mult Living         Review of Systems  Constitutional: Negative for fever and weight loss.  Cardiovascular: Negative for chest pain.  Gastrointestinal: Negative for bowel incontinence.  Musculoskeletal: Positive for back pain.  Neurological: Positive for numbness and paresthesias. Negative for weakness.  All other systems reviewed and are negative.    Allergies  Penicillins  Home Medications   Current Outpatient Rx  Name Route Sig Dispense Refill  . ALPRAZOLAM 0.5 MG PO TABS Oral Take 0.5 mg by mouth 3 (three) times daily as needed. For anxiety    . DOXYCYCLINE MONOHYDRATE 100 MG PO TABS Oral Take 100 mg by mouth 2 (two) times daily.      Marland Kitchen HYDROCODONE-ACETAMINOPHEN 10-325 MG PO TABS Oral Take 1 tablet by mouth every 6 (six) hours as needed.      Marland Kitchen KETOPROFEN 50 MG PO CAPS Oral Take 50 mg by mouth 4 (four) times daily as needed. For pain     . METHYLPHENIDATE HCL 5 MG PO TABS Oral Take 5 mg by mouth 2 (two) times daily.      Marland Kitchen OMEPRAZOLE 10 MG PO CPDR Oral Take 10 mg by mouth daily.      Marland Kitchen LAMOTRIGINE 100 MG PO TABS Oral Take 100 mg by mouth daily.        BP 131/92  Pulse 90  Temp(Src) 97.7  F (36.5 C) (Oral)  Resp 20  Ht 5\' 4"  (1.626 m)  Wt 163 lb (73.936 kg)  BMI 27.98 kg/m2  SpO2 100%  Physical Exam  Nursing note and vitals reviewed. Constitutional: She is oriented to person, place, and time. She appears well-developed and well-nourished.  HENT:  Head: Normocephalic and atraumatic.  Neck: Normal range of motion.  Cardiovascular: Normal rate, regular rhythm and normal heart sounds.   Pulmonary/Chest: Effort normal.  Abdominal: Soft. Bowel sounds are normal.  Musculoskeletal: She exhibits tenderness.  Neurological: She is alert and oriented to person, place, and time.  Skin: Skin is warm.  Psychiatric: She has a normal mood and affect.    ED Course  Procedures (including critical care time)  Labs Reviewed - No data to display No results found.   No diagnosis  found.    MDM  Pt had epidural injection on Monday.  Pt reports no relief.   Pt worried about hardware slipping.  I obtained xray of LS spine.  Radiologist reports fusion is similar to previous.   I counseled pt,  I advised her she needs to see her primary for pain management, further evaluation until she sees Dr. Lovell Sheehan.         Langston Masker, [place] 08/23/11 2159  Langston Masker, [place] 08/23/11 2204

## 2011-08-23 NOTE — ED Provider Notes (Signed)
reviewed  Dayton Bailiff, MD 08/23/11 2207

## 2011-08-23 NOTE — ED Notes (Signed)
Patient states she has had chronic back pain since having a back fusion Jan 2011.  States she had a epidural steriod injection one week ago with mild relief.  States that pain has become unbearable over the last few days.

## 2011-09-30 ENCOUNTER — Other Ambulatory Visit: Payer: Self-pay | Admitting: Neurosurgery

## 2011-09-30 DIAGNOSIS — M549 Dorsalgia, unspecified: Secondary | ICD-10-CM

## 2011-10-05 ENCOUNTER — Ambulatory Visit
Admission: RE | Admit: 2011-10-05 | Discharge: 2011-10-05 | Disposition: A | Discharge status: Home / Self Care | Payer: Medicaid Other | Source: Ambulatory Visit | Attending: Neurosurgery | Admitting: Neurosurgery

## 2011-10-05 DIAGNOSIS — M549 Dorsalgia, unspecified: Secondary | ICD-10-CM

## 2011-10-05 MED ORDER — IOHEXOL 300 MG/ML  SOLN
75.0000 mL | Freq: Once | INTRAMUSCULAR | Status: AC | PRN
  Administered 2011-10-05: 75 mL via INTRAVENOUS

## 2011-11-01 ENCOUNTER — Other Ambulatory Visit: Payer: Self-pay | Admitting: Neurosurgery

## 2011-11-08 ENCOUNTER — Encounter (HOSPITAL_COMMUNITY): Payer: Self-pay | Admitting: Pharmacy Technician

## 2011-11-14 ENCOUNTER — Encounter (HOSPITAL_COMMUNITY)
Admission: RE | Admit: 2011-11-14 | Discharge: 2011-11-14 | Disposition: A | Discharge status: Home / Self Care | Payer: Medicaid Other | Source: Ambulatory Visit | Attending: Neurosurgery | Admitting: Neurosurgery

## 2011-11-14 ENCOUNTER — Encounter (HOSPITAL_COMMUNITY): Payer: Self-pay

## 2011-11-14 HISTORY — DX: Gastro-esophageal reflux disease without esophagitis: K21.9

## 2011-11-14 HISTORY — DX: Malignant (primary) neoplasm, unspecified: C80.1

## 2011-11-14 HISTORY — DX: Headache: R51

## 2011-11-14 HISTORY — DX: Unspecified osteoarthritis, unspecified site: M19.90

## 2011-11-14 LAB — CBC
Hemoglobin: 13.7 g/dL (ref 12.0–15.0)
MCV: 90.7 fL (ref 78.0–100.0)
Platelets: 239 10*3/uL (ref 150–400)
RBC: 4.3 MIL/uL (ref 3.87–5.11)
WBC: 4.7 10*3/uL (ref 4.0–10.5)

## 2011-11-14 LAB — TYPE AND SCREEN: ABO/RH(D): O POS

## 2011-11-14 NOTE — Pre-Procedure Instructions (Addendum)
20 Maureen [name]  11/14/2011   Your procedure is scheduled on: 11/21/11  Report to [facility] at 730 AM.  Call this number if you have problems the morning of surgery: [phone]   Remember:   Do not eat food:After Midnight.  May have clear liquids: up to 4 Hours before arrival.  Clear liquids include soda, tea, black coffee, apple or grape juice, broth.  Take these medicines the morning of surgery with A SIP OF WATER:XANAX, HYDROCODONE, NEURONTIN, PRILOSEC   Do not wear jewelry, make-up or nail polish.  Do not wear lotions, powders, or perfumes. You may wear deodorant.  Do not shave 48 hours prior to surgery.  Do not bring valuables to the hospital.  Contacts, dentures or bridgework may not be worn into surgery.  Leave suitcase in the car. After surgery it may be brought to your room.  For patients admitted to the hospital, checkout time is 11:00 AM the day of discharge.   Patients discharged the day of surgery will not be allowed to drive home.  Name and phone number of your driver:   Special Instructions: CHG SHOWER NITE BEFORE  AND AM OF SURGERY, CHIN TO TOES NOT FACE OR HEAD OR PRIVATES.   Please read over the following fact sheets that you were given: Pain Booklet, Coughing and Deep Breathing, Blood Transfusion Information, MRSA Information and Surgical Site Infection Prevention

## 2011-11-14 NOTE — Pre-Procedure Instructions (Addendum)
20 Maureen [name]  11/14/2011   Your procedure is scheduled on:  1.28.13  Report to [facility].  Call this number if you have problems the morning of surgery: [phone]   Remember:   Do not eat food:After Midnight.  May have clear liquids: up to 4 Hours before arrival.  Clear liquids include soda, tea, black coffee, apple or grape juice, broth.  Take these medicines the morning of surgery with A SIP OF WATER XANAX, OXYCODONE,NEURONTIN ,PRILOSEC   Do not wear jewelry, make-up or nail polish.  Do not wear lotions, powders, or perfumes. You may wear deodorant.  Do not shave 48 hours prior to surgery.  Do not bring valuables to the hospital.  Contacts, dentures or bridgework may not be worn into surgery.  Leave suitcase in the car. After surgery it may be brought to your room.  For patients admitted to the hospital, checkout time is 11:00 AM the day of discharge.   Patients discharged the day of surgery will not be allowed to drive home.  Name and phone number of your driver:Maureen [name] [phone]   Maureen [name] [phone]  Maureen [name]  Special Instructions: CHG Shower Use Special Wash: 1/2 bottle night before surgery and 1/2 bottle morning of surgery.   Please read over the following fact sheets that you were given: Pain Booklet, Coughing and Deep Breathing, Blood Transfusion Information, MRSA Information and Surgical Site Infection Prevention

## 2011-11-20 MED ORDER — VANCOMYCIN HCL IN DEXTROSE 1-5 GM/200ML-% IV SOLN
1000.0000 mg | INTRAVENOUS | Status: AC
  Administered 2011-11-21: 1000 mg via INTRAVENOUS
  Filled 2011-11-20: qty 200

## 2011-11-21 ENCOUNTER — Ambulatory Visit (HOSPITAL_COMMUNITY): Payer: Medicaid Other

## 2011-11-21 ENCOUNTER — Encounter (HOSPITAL_COMMUNITY): Payer: Self-pay | Admitting: *Deleted

## 2011-11-21 ENCOUNTER — Encounter (HOSPITAL_COMMUNITY)
Admission: RE | Disposition: A | Discharge status: Home / Self Care | Payer: Self-pay | Source: Ambulatory Visit | Attending: Neurosurgery

## 2011-11-21 ENCOUNTER — Ambulatory Visit (HOSPITAL_COMMUNITY): Payer: Medicaid Other | Admitting: Anesthesiology

## 2011-11-21 ENCOUNTER — Encounter (HOSPITAL_COMMUNITY): Payer: Self-pay | Admitting: Anesthesiology

## 2011-11-21 ENCOUNTER — Inpatient Hospital Stay (HOSPITAL_COMMUNITY)
Admission: RE | Admit: 2011-11-21 | Discharge: 2011-11-25 | DRG: 460 | Disposition: A | Discharge status: Home / Self Care | Payer: Medicaid Other | Source: Ambulatory Visit | Attending: Neurosurgery | Admitting: Neurosurgery

## 2011-11-21 DIAGNOSIS — M5137 Other intervertebral disc degeneration, lumbosacral region: Principal | ICD-10-CM | POA: Diagnosis present

## 2011-11-21 DIAGNOSIS — Z981 Arthrodesis status: Secondary | ICD-10-CM

## 2011-11-21 DIAGNOSIS — Z01818 Encounter for other preprocedural examination: Secondary | ICD-10-CM

## 2011-11-21 DIAGNOSIS — K5900 Constipation, unspecified: Secondary | ICD-10-CM | POA: Diagnosis present

## 2011-11-21 DIAGNOSIS — Z01812 Encounter for preprocedural laboratory examination: Secondary | ICD-10-CM

## 2011-11-21 DIAGNOSIS — M5440 Lumbago with sciatica, unspecified side: Secondary | ICD-10-CM

## 2011-11-21 DIAGNOSIS — B3731 Acute candidiasis of vulva and vagina: Secondary | ICD-10-CM | POA: Diagnosis present

## 2011-11-21 DIAGNOSIS — B373 Candidiasis of vulva and vagina: Secondary | ICD-10-CM | POA: Diagnosis present

## 2011-11-21 DIAGNOSIS — M51379 Other intervertebral disc degeneration, lumbosacral region without mention of lumbar back pain or lower extremity pain: Principal | ICD-10-CM | POA: Diagnosis present

## 2011-11-21 SURGERY — POSTERIOR LUMBAR FUSION 2 LEVEL
Anesthesia: General | Site: Spine Lumbar | Wound class: Clean

## 2011-11-21 MED ORDER — GABAPENTIN 300 MG PO CAPS
300.0000 mg | ORAL_CAPSULE | Freq: Three times a day (TID) | ORAL | Status: DC
  Administered 2011-11-21 – 2011-11-25 (×12): 300 mg via ORAL
  Filled 2011-11-21 (×15): qty 1

## 2011-11-21 MED ORDER — MENTHOL 3 MG MT LOZG: 1.0000 | LOZENGE | OROMUCOSAL | Status: DC | PRN

## 2011-11-21 MED ORDER — MIDAZOLAM HCL 5 MG/5ML IJ SOLN
INTRAMUSCULAR | Status: DC | PRN
  Administered 2011-11-21: 2 mg via INTRAVENOUS

## 2011-11-21 MED ORDER — PROPOFOL 10 MG/ML IV EMUL
INTRAVENOUS | Status: DC | PRN
  Administered 2011-11-21: 200 mg via INTRAVENOUS

## 2011-11-21 MED ORDER — DIPHENHYDRAMINE HCL 12.5 MG/5ML PO ELIX
12.5000 mg | ORAL_SOLUTION | Freq: Four times a day (QID) | ORAL | Status: DC | PRN
  Filled 2011-11-21: qty 5

## 2011-11-21 MED ORDER — PHENOL 1.4 % MT LIQD: 1.0000 | OROMUCOSAL | Status: DC | PRN

## 2011-11-21 MED ORDER — SODIUM CHLORIDE 0.9 % IJ SOLN: 9.0000 mL | INTRAMUSCULAR | Status: DC | PRN

## 2011-11-21 MED ORDER — PROMETHAZINE HCL 25 MG/ML IJ SOLN
12.5000 mg | Freq: Four times a day (QID) | INTRAMUSCULAR | Status: DC | PRN
  Administered 2011-11-21 – 2011-11-23 (×4): 12.5 mg via INTRAVENOUS
  Filled 2011-11-21 (×4): qty 1

## 2011-11-21 MED ORDER — BACITRACIN 50000 UNITS IM SOLR
INTRAMUSCULAR | Status: AC
  Filled 2011-11-21: qty 1

## 2011-11-21 MED ORDER — ONDANSETRON HCL 4 MG/2ML IJ SOLN
4.0000 mg | INTRAMUSCULAR | Status: DC | PRN
  Administered 2011-11-21: 4 mg via INTRAVENOUS

## 2011-11-21 MED ORDER — MEPERIDINE HCL 25 MG/ML IJ SOLN: 6.2500 mg | INTRAMUSCULAR | Status: DC | PRN

## 2011-11-21 MED ORDER — MORPHINE SULFATE 2 MG/ML IJ SOLN: 0.0500 mg/kg | INTRAMUSCULAR | Status: DC | PRN

## 2011-11-21 MED ORDER — HYDROCODONE-ACETAMINOPHEN 5-325 MG PO TABS: 1.0000 | ORAL_TABLET | ORAL | Status: DC | PRN

## 2011-11-21 MED ORDER — LACTATED RINGERS IV SOLN
INTRAVENOUS | Status: DC
  Administered 2011-11-21 – 2011-11-23 (×4): via INTRAVENOUS

## 2011-11-21 MED ORDER — DOXYCYCLINE HYCLATE 100 MG PO TABS
100.0000 mg | ORAL_TABLET | Freq: Two times a day (BID) | ORAL | Status: DC
  Administered 2011-11-21 – 2011-11-25 (×8): 100 mg via ORAL
  Filled 2011-11-21 (×9): qty 1

## 2011-11-21 MED ORDER — HEMOSTATIC AGENTS (NO CHARGE) OPTIME
TOPICAL | Status: DC | PRN
  Administered 2011-11-21: 1 via TOPICAL

## 2011-11-21 MED ORDER — VANCOMYCIN HCL IN DEXTROSE 1-5 GM/200ML-% IV SOLN
1000.0000 mg | INTRAVENOUS | Status: DC
  Administered 2011-11-22: 1000 mg via INTRAVENOUS
  Filled 2011-11-21 (×2): qty 200

## 2011-11-21 MED ORDER — ROCURONIUM BROMIDE 100 MG/10ML IV SOLN
INTRAVENOUS | Status: DC | PRN
  Administered 2011-11-21 (×2): 10 mg via INTRAVENOUS
  Administered 2011-11-21: 50 mg via INTRAVENOUS

## 2011-11-21 MED ORDER — SODIUM CHLORIDE 0.9 % IR SOLN
Status: DC | PRN
  Administered 2011-11-21: 14:00:00

## 2011-11-21 MED ORDER — BUPIVACAINE-EPINEPHRINE PF 0.5-1:200000 % IJ SOLN
INTRAMUSCULAR | Status: DC | PRN
  Administered 2011-11-21: 10 mL

## 2011-11-21 MED ORDER — DOXYCYCLINE HYCLATE 100 MG PO CAPS: 100.0000 mg | ORAL_CAPSULE | Freq: Two times a day (BID) | ORAL | Status: DC

## 2011-11-21 MED ORDER — OXYCODONE-ACETAMINOPHEN 5-325 MG PO TABS
1.0000 | ORAL_TABLET | ORAL | Status: DC | PRN
  Administered 2011-11-21 (×2): 2 via ORAL
  Administered 2011-11-22 (×2): 1 via ORAL
  Administered 2011-11-22 (×2): 2 via ORAL
  Administered 2011-11-23: 1 via ORAL
  Administered 2011-11-23 (×2): 2 via ORAL
  Administered 2011-11-23: 1 via ORAL
  Administered 2011-11-23: 2 via ORAL
  Administered 2011-11-24: 1 via ORAL
  Administered 2011-11-24 (×2): 2 via ORAL
  Administered 2011-11-24 (×2): 1 via ORAL
  Administered 2011-11-25 (×3): 2 via ORAL
  Filled 2011-11-21 (×8): qty 2
  Filled 2011-11-21: qty 1
  Filled 2011-11-21 (×8): qty 2

## 2011-11-21 MED ORDER — MORPHINE SULFATE (PF) 1 MG/ML IV SOLN
INTRAVENOUS | Status: DC
  Administered 2011-11-21: 15:00:00 via INTRAVENOUS
  Administered 2011-11-21: 26.48 mg via INTRAVENOUS
  Administered 2011-11-21: 7.5 mg via INTRAVENOUS
  Administered 2011-11-22: 6 mg via INTRAVENOUS
  Administered 2011-11-22: 25 mg via INTRAVENOUS
  Administered 2011-11-22 (×3): via INTRAVENOUS
  Administered 2011-11-22: 3 mg via INTRAVENOUS
  Administered 2011-11-22: 9 mg via INTRAVENOUS
  Administered 2011-11-22: 8.08 mg via INTRAVENOUS
  Administered 2011-11-22: 22:00:00 via INTRAVENOUS
  Administered 2011-11-22: 12 mg via INTRAVENOUS
  Administered 2011-11-22: 10.7 mg via INTRAVENOUS
  Administered 2011-11-23: 15 mg via INTRAVENOUS
  Administered 2011-11-23: 14.5 mg via INTRAVENOUS
  Administered 2011-11-23: 09:00:00 via INTRAVENOUS
  Filled 2011-11-21 (×6): qty 25

## 2011-11-21 MED ORDER — HETASTARCH-ELECTROLYTES 6 % IV SOLN
INTRAVENOUS | Status: DC | PRN
  Administered 2011-11-21: 13:00:00 via INTRAVENOUS

## 2011-11-21 MED ORDER — FENTANYL CITRATE 0.05 MG/ML IJ SOLN
INTRAMUSCULAR | Status: DC | PRN
  Administered 2011-11-21 (×2): 50 ug via INTRAVENOUS
  Administered 2011-11-21: 250 ug via INTRAVENOUS
  Administered 2011-11-21: 50 ug via INTRAVENOUS

## 2011-11-21 MED ORDER — THROMBIN 20000 UNITS EX KIT
PACK | CUTANEOUS | Status: DC | PRN
  Administered 2011-11-21: 20000 [IU] via TOPICAL

## 2011-11-21 MED ORDER — DIAZEPAM 5 MG PO TABS
ORAL_TABLET | ORAL | Status: AC
  Filled 2011-11-21: qty 1

## 2011-11-21 MED ORDER — 0.9 % SODIUM CHLORIDE (POUR BTL) OPTIME
TOPICAL | Status: DC | PRN
  Administered 2011-11-21: 1000 mL

## 2011-11-21 MED ORDER — NEOSTIGMINE METHYLSULFATE 1 MG/ML IJ SOLN
INTRAMUSCULAR | Status: DC | PRN
  Administered 2011-11-21: 3 mg via INTRAVENOUS

## 2011-11-21 MED ORDER — ACETAMINOPHEN 650 MG RE SUPP: 650.0000 mg | RECTAL | Status: DC | PRN

## 2011-11-21 MED ORDER — HYDROMORPHONE HCL PF 1 MG/ML IJ SOLN
INTRAMUSCULAR | Status: AC
  Filled 2011-11-21: qty 1

## 2011-11-21 MED ORDER — ONDANSETRON HCL 4 MG/2ML IJ SOLN
4.0000 mg | Freq: Four times a day (QID) | INTRAMUSCULAR | Status: DC | PRN
  Filled 2011-11-21 (×2): qty 2

## 2011-11-21 MED ORDER — BACITRACIN ZINC 500 UNIT/GM EX OINT
TOPICAL_OINTMENT | CUTANEOUS | Status: DC | PRN
  Administered 2011-11-21: 1 via TOPICAL

## 2011-11-21 MED ORDER — ONDANSETRON HCL 4 MG/2ML IJ SOLN
INTRAMUSCULAR | Status: DC | PRN
  Administered 2011-11-21: 4 mg via INTRAVENOUS

## 2011-11-21 MED ORDER — GLYCOPYRROLATE 0.2 MG/ML IJ SOLN
INTRAMUSCULAR | Status: DC | PRN
  Administered 2011-11-21: .6 mg via INTRAVENOUS

## 2011-11-21 MED ORDER — LACTATED RINGERS IV SOLN
INTRAVENOUS | Status: DC | PRN
  Administered 2011-11-21 (×3): via INTRAVENOUS

## 2011-11-21 MED ORDER — ACETAMINOPHEN 325 MG PO TABS: 650.0000 mg | ORAL_TABLET | ORAL | Status: DC | PRN

## 2011-11-21 MED ORDER — BUPIVACAINE LIPOSOME 1.3 % IJ SUSP
20.0000 mL | INTRAMUSCULAR | Status: AC
  Administered 2011-11-21: 20 mL
  Filled 2011-11-21: qty 20

## 2011-11-21 MED ORDER — OXYCODONE-ACETAMINOPHEN 5-325 MG PO TABS
ORAL_TABLET | ORAL | Status: AC
  Filled 2011-11-21: qty 2

## 2011-11-21 MED ORDER — DIAZEPAM 5 MG PO TABS
5.0000 mg | ORAL_TABLET | Freq: Four times a day (QID) | ORAL | Status: DC | PRN
  Administered 2011-11-21 – 2011-11-25 (×11): 5 mg via ORAL
  Filled 2011-11-21 (×10): qty 1

## 2011-11-21 MED ORDER — SODIUM CHLORIDE 0.9 % IV SOLN
INTRAVENOUS | Status: AC
  Filled 2011-11-21: qty 500

## 2011-11-21 MED ORDER — ZOLPIDEM TARTRATE 10 MG PO TABS
10.0000 mg | ORAL_TABLET | Freq: Every evening | ORAL | Status: DC | PRN
  Administered 2011-11-23: 10 mg via ORAL
  Filled 2011-11-21: qty 1

## 2011-11-21 MED ORDER — HYDROMORPHONE HCL PF 1 MG/ML IJ SOLN
0.2500 mg | INTRAMUSCULAR | Status: DC | PRN
  Administered 2011-11-21: 1 mg via INTRAVENOUS

## 2011-11-21 MED ORDER — NALOXONE HCL 0.4 MG/ML IJ SOLN
0.4000 mg | INTRAMUSCULAR | Status: DC | PRN
  Filled 2011-11-21: qty 1

## 2011-11-21 MED ORDER — ONDANSETRON HCL 4 MG/2ML IJ SOLN: 4.0000 mg | Freq: Once | INTRAMUSCULAR | Status: DC | PRN

## 2011-11-21 MED ORDER — PANTOPRAZOLE SODIUM 40 MG PO TBEC
40.0000 mg | DELAYED_RELEASE_TABLET | Freq: Every day | ORAL | Status: DC
  Administered 2011-11-21 – 2011-11-25 (×5): 40 mg via ORAL
  Filled 2011-11-21 (×4): qty 1

## 2011-11-21 MED ORDER — DIPHENHYDRAMINE HCL 50 MG/ML IJ SOLN
12.5000 mg | Freq: Four times a day (QID) | INTRAMUSCULAR | Status: DC | PRN
  Filled 2011-11-21: qty 0.25

## 2011-11-21 SURGICAL SUPPLY — 72 items
APL SKNCLS STERI-STRIP NONHPOA (GAUZE/BANDAGES/DRESSINGS)
BAG DECANTER FOR FLEXI CONT (MISCELLANEOUS) ×2 IMPLANT
BENZOIN TINCTURE PRP APPL 2/3 (GAUZE/BANDAGES/DRESSINGS) ×1 IMPLANT
BLADE SURG ROTATE 9660 (MISCELLANEOUS) IMPLANT
BRUSH SCRUB EZ PLAIN DRY (MISCELLANEOUS) ×2 IMPLANT
BUR ACORN 6.0 (BURR) ×2 IMPLANT
BUR MATCHSTICK NEURO 3.0 LAGG (BURR) ×2 IMPLANT
CANISTER SUCTION 2500CC (MISCELLANEOUS) ×2 IMPLANT
CAP REVERE LOCKING (Cap) ×6 IMPLANT
CLOTH BEACON ORANGE TIMEOUT ST (SAFETY) ×2 IMPLANT
CONT SPEC 4OZ CLIKSEAL STRL BL (MISCELLANEOUS) ×2 IMPLANT
COVER BACK TABLE 24X17X13 BIG (DRAPES) IMPLANT
DRAPE C-ARM 42X72 X-RAY (DRAPES) ×4 IMPLANT
DRAPE LAPAROTOMY 100X72X124 (DRAPES) ×2 IMPLANT
DRAPE POUCH INSTRU U-SHP 10X18 (DRAPES) ×2 IMPLANT
DRAPE SURG 17X23 STRL (DRAPES) ×8 IMPLANT
ELECT BLADE 4.0 EZ CLEAN MEGAD (MISCELLANEOUS) ×2
ELECT REM PT RETURN 9FT ADLT (ELECTROSURGICAL) ×2
ELECTRODE BLDE 4.0 EZ CLN MEGD (MISCELLANEOUS) ×1 IMPLANT
ELECTRODE REM PT RTRN 9FT ADLT (ELECTROSURGICAL) ×1 IMPLANT
GAUZE SPONGE 4X4 16PLY XRAY LF (GAUZE/BANDAGES/DRESSINGS) ×1 IMPLANT
GLOVE BIO SURGEON STRL SZ 6.5 (GLOVE) ×3 IMPLANT
GLOVE BIO SURGEON STRL SZ8.5 (GLOVE) ×4 IMPLANT
GLOVE BIOGEL PI IND STRL 6.5 (GLOVE) IMPLANT
GLOVE BIOGEL PI IND STRL 7.0 (GLOVE) IMPLANT
GLOVE BIOGEL PI IND STRL 7.5 (GLOVE) IMPLANT
GLOVE BIOGEL PI IND STRL 8 (GLOVE) IMPLANT
GLOVE BIOGEL PI INDICATOR 6.5 (GLOVE) ×2
GLOVE BIOGEL PI INDICATOR 7.0 (GLOVE) ×2
GLOVE BIOGEL PI INDICATOR 7.5 (GLOVE) ×1
GLOVE BIOGEL PI INDICATOR 8 (GLOVE) ×2
GLOVE ECLIPSE 7.5 STRL STRAW (GLOVE) ×2 IMPLANT
GLOVE EXAM NITRILE LRG STRL (GLOVE) IMPLANT
GLOVE EXAM NITRILE MD LF STRL (GLOVE) ×1 IMPLANT
GLOVE EXAM NITRILE XL STR (GLOVE) IMPLANT
GLOVE EXAM NITRILE XS STR PU (GLOVE) IMPLANT
GLOVE SS BIOGEL STRL SZ 8 (GLOVE) ×2 IMPLANT
GLOVE SUPERSENSE BIOGEL SZ 8 (GLOVE) ×2
GOWN BRE IMP SLV AUR LG STRL (GOWN DISPOSABLE) ×4 IMPLANT
GOWN BRE IMP SLV AUR XL STRL (GOWN DISPOSABLE) ×5 IMPLANT
GOWN STRL REIN 2XL LVL4 (GOWN DISPOSABLE) IMPLANT
KIT BASIN OR (CUSTOM PROCEDURE TRAY) ×2 IMPLANT
KIT INFUSE MEDIUM (Orthopedic Implant) ×1 IMPLANT
KIT ROOM TURNOVER OR (KITS) ×2 IMPLANT
NDL HYPO 21X1.5 SAFETY (NEEDLE) IMPLANT
NEEDLE HYPO 21X1.5 SAFETY (NEEDLE) ×2 IMPLANT
NEEDLE HYPO 22GX1.5 SAFETY (NEEDLE) ×2 IMPLANT
NS IRRIG 1000ML POUR BTL (IV SOLUTION) ×2 IMPLANT
PACK LAMINECTOMY NEURO (CUSTOM PROCEDURE TRAY) ×2 IMPLANT
PAD ARMBOARD 7.5X6 YLW CONV (MISCELLANEOUS) ×8 IMPLANT
PATTIES SURGICAL .5 X1 (DISPOSABLE) IMPLANT
PUTTY 10ML ACTIFUSE ABX (Putty) ×1 IMPLANT
PUTTY 5ML ACTIFUSE ABX (Putty) ×1 IMPLANT
ROD REVERE 6.35 CURVED 55MM (Rod) ×2 IMPLANT
SCREW 7.5X45MM (Screw) ×2 IMPLANT
SCREW 7.5X50MM (Screw) ×2 IMPLANT
SCREW REVERE 6.5X50MM (Screw) ×2 IMPLANT
SPONGE GAUZE 4X4 12PLY (GAUZE/BANDAGES/DRESSINGS) ×1 IMPLANT
SPONGE LAP 4X18 X RAY DECT (DISPOSABLE) IMPLANT
SPONGE NEURO XRAY DETECT 1X3 (DISPOSABLE) IMPLANT
SPONGE SURGIFOAM ABS GEL 100 (HEMOSTASIS) ×2 IMPLANT
STRIP CLOSURE SKIN 1/2X4 (GAUZE/BANDAGES/DRESSINGS) ×1 IMPLANT
SUT VIC AB 1 CT1 18XBRD ANBCTR (SUTURE) ×2 IMPLANT
SUT VIC AB 1 CT1 8-18 (SUTURE) ×4
SUT VIC AB 2-0 CP2 18 (SUTURE) ×4 IMPLANT
SYR 20CC LL (SYRINGE) ×1 IMPLANT
SYR 20ML ECCENTRIC (SYRINGE) ×2 IMPLANT
TAPE CLOTH SURG 4X10 WHT LF (GAUZE/BANDAGES/DRESSINGS) ×1 IMPLANT
TOWEL OR 17X24 6PK STRL BLUE (TOWEL DISPOSABLE) ×2 IMPLANT
TOWEL OR 17X26 10 PK STRL BLUE (TOWEL DISPOSABLE) ×2 IMPLANT
TRAY FOLEY CATH 14FRSI W/METER (CATHETERS) ×2 IMPLANT
WATER STERILE IRR 1000ML POUR (IV SOLUTION) ×2 IMPLANT

## 2011-11-21 NOTE — Progress Notes (Signed)
Patient ID: Maureen [name], female   DOB: 04-Sep-1966, 46 y.o.   MRN: [id number] Subjective:  The patient is somnolent but easily arousable. She looks well.  Objective: Vital signs in last 24 hours: Temp:  [97.8 F (36.6 C)-98.2 F (36.8 C)] 97.8 F (36.6 C) (01/28 1452) Pulse Rate:  [78] 78  (01/28 0756) Resp:  [20] 20  (01/28 0756) BP: (110)/(72) 110/72 mmHg (01/28 0756) SpO2:  [97 %] 97 % (01/28 0756) Weight:  [71.8 kg (158 lb 4.6 oz)] 71.8 kg (158 lb 4.6 oz) (01/28 0900)  Intake/Output from previous day:   Intake/Output this shift: Total I/O In: 2600 [I.V.:2100; IV Piggyback:500] Out: 825 [Urine:700; Blood:125]  Physical exam the patient is somnolent but easily arousable, she is moving all 4 extremities well.  Lab Results: No results found for this basename: WBC:2,HGB:2,HCT:2,PLT:2 in the last 72 hours BMET No results found for this basename: NA:2,K:2,CL:2,CO2:2,GLUCOSE:2,BUN:2,CREATININE:2,CALCIUM:2 in the last 72 hours  Studies/Results: No results found.  Assessment/Plan: Patient is doing well.  LOS: 0 days     Florance Paolillo D 11/21/2011, 3:01 PM

## 2011-11-21 NOTE — Progress Notes (Signed)
ANTIBIOTIC CONSULT NOTE - INITIAL  Pharmacy Consult for Vancomycin Indication: Surgical prophylaxis  Allergies  Allergen Reactions  . Penicillins Rash    Patient Measurements: Height: 5' 4.17" (163 cm) Weight: 158 lb 4.6 oz (71.8 kg) IBW/kg (Calculated) : 55.09   Vital Signs: Temp: 97.6 F (36.4 C) (01/28 1504) Temp src: Oral (01/28 1613) BP: 103/71 mmHg (01/28 1613) Pulse Rate: 82  (01/28 1613) Intake/Output from previous day:   Intake/Output from this shift: Total I/O In: 2600 [I.V.:2100; IV Piggyback:500] Out: 825 [Urine:700; Blood:125]  Labs: No results found for this basename: WBC:3,HGB:3,PLT:3,LABCREA:3,CREATININE:3 in the last 72 hours Estimated Creatinine Clearance: 87.6 ml/min (by C-G formula based on Cr of 0.56). No results found for this basename: VANCOTROUGH:2,VANCOPEAK:2,VANCORANDOM:2,GENTTROUGH:2,GENTPEAK:2,GENTRANDOM:2,TOBRATROUGH:2,TOBRAPEAK:2,TOBRARND:2,AMIKACINPEAK:2,AMIKACINTROU:2,AMIKACIN:2, in the last 72 hours   Microbiology: No results found for this or any previous visit (from the past 720 hour(s)).  Medical History: Past Medical History  Diagnosis Date  . Irritable bowel syndrome (IBS)   . Abdominal pain   . Fusion of spine Jan 2011    L4-S1 fusion  . Depression   . Anxiety   . Cancer     MOLE PRE  . GERD (gastroesophageal reflux disease)   . Headache     SINUS  . Arthritis     Assessment: 46 YOF with degenerative disc disease, here for posterior lumbar fusion to start vancomycin for post-op prophylaxis. Patient received vancomycin 1g pre-op at 1130. No BMET since this admission, no history of renal impairment noted per chart, BMET pending for tomorrow morning.   Plan:  1. Vancomycin 1g IV Q24hrs next dose 0300 on 1/29 2. F/u renal function and length of treatment.  Riki Rusk 11/21/2011,4:31 PM

## 2011-11-21 NOTE — Op Note (Signed)
Brief history: The patient is a 46 year old white female who I performed an L4-5 and L5-S1 anterior lumbar interbody fusion on about 2 years ago. The patient has had persistent back pain and was worked up with a lumbar CT which demonstrated findings consistent with a pseudoarthrosis. I discussed the various treatment options with the patient including surgery. The patient has weighed the risks, benefits, and alternatives surgery and decided proceed with the operation.  Preoperative diagnosis: L4-5 and L5-S1 Degenerative disc disease,lumbago; lumbar radiculopathy  Postoperative diagnosis: The same   Procedure:posterior segmental instrumentation from L4 to S1 with globus titanium pedicle screws and rods; posterior lateral arthrodesis at L4-5 and L5-S1 with local morselized autograft bone and Vitoss bone graft extender and bone morphogenic protein.  Surgeon: Dr. Delma Officer  Asst.: Dr. Shirlean Kelly  Anesthesia: Gen. endotracheal  Estimated blood loss: 125 cc  Drains: None  Locations: None  Description of procedure: The patient was brought to the operating room by the anesthesia team. General endotracheal anesthesia was induced. The patient was turned to the prone position on the Wilson frame. The patient's lumbosacral region was then prepared with Betadine scrub and Betadine solution. Sterile drapes were applied.  I then injected the area to be incised with Marcaine with epinephrine solution. I then used the scalpel to make a linear midline incision over the L4-5 and L5-S1 interspace. I then used electrocautery to perform a bilateral subperiosteal dissection exposing the spinous process and lamina of L3, L4, L5 and the upper sacrum.. We then obtained intraoperative radiograph to confirm our location. We then inserted the Verstrac retractor to provide exposure.   We now turned attention to the instrumentation. Under fluoroscopic guidance we cannulated the bilateral L4, L5 and S1 pedicles  with the bone probe. We then removed the bone probe. We then tapped the pedicle with a 5.5 and 6.5 millimeter tap. We then removed the tap. We probed inside the tapped pedicle with a ball probe to rule out cortical breaches. We then inserted appropriate sized pedicle screws into the bilateral L4, L5 and S1 pedicles bilaterally under fluoroscopic guidance.We then connected the unilateral pedicle screws with a lordotic rod. We secured the rod in place with the caps. We then tightened the caps appropriately. This completed the instrumentation from L4-S1.  We now turned our attention to the posterior lateral arthrodesis at L4-5 and L5-S1. We used the high-speed drill to decorticate the remainder of the facets, pars, transverse process at L4-5 and L5-S1. We then applied a combination of local morselized autograft bone, bone morphogenic protein-soaked collagen sponges and Vitoss bone graft extender over these decorticated posterior lateral structures. This completed the posterior lateral arthrodesis.  We then obtained hemostasis using bipolar electrocautery. We irrigated the wound out with bacitracin solution. We inspected the thecal sac and nerve roots and noted they were well decompressed. We then removed the retractor. We reapproximated patient's thoracolumbar fascia with interrupted #1 Vicryl suture. We reapproximated patient's subcutaneous tissue with interrupted 2-0 Vicryl suture. The reapproximated patient's skin with Steri-Strips and benzoin. The wound was then coated with bacitracin ointment. A sterile dressing was applied. The drapes were removed. The patient was subsequently returned to the supine position where they were extubated by the anesthesia team. He was then transported to the post anesthesia care unit in stable condition. All sponge instrument and needle counts were correct at the end of this case.

## 2011-11-21 NOTE — Anesthesia Procedure Notes (Signed)
Procedure Name: Intubation Date/Time: 11/21/2011 11:52 AM Performed by: Romie Minus Pre-anesthesia Checklist: Emergency Drugs available, Suction available, Patient being monitored, Patient identified and Timeout performed Patient Re-evaluated:Patient Re-evaluated prior to inductionOxygen Delivery Method: Circle System Utilized Preoxygenation: Pre-oxygenation with 100% oxygen Intubation Type: IV induction Ventilation: Mask ventilation with difficulty and Oral airway inserted - appropriate to patient size Laryngoscope Size: Mac and 3 Grade View: Grade II Tube type: Oral Tube size: 7.0 mm Number of attempts: 1 Airway Equipment and Method: stylet Placement Confirmation: ETT inserted through vocal cords under direct vision,  positive ETCO2 and breath sounds checked- equal and bilateral Secured at: 21 cm Tube secured with: Tape Dental Injury: Teeth and Oropharynx as per pre-operative assessment

## 2011-11-21 NOTE — H&P (Signed)
Subjective: The patient is a 46 year old white female who I performed at L4-5 and L5-S1 anterior lumbar interbody fusion on in a couple years ago. Patient has had persistent back pain. We have worked her up with a lumbar CT. This demonstrated findings consistent with a pseudoarthrosis. I discussed the various treatment options with the patient including surgery. The patient has weighed the risks, benefits, and alternatives surgery decided proceed with a posterior instrumentation and fusion at L4-5 and L5-S1.   Past Medical History  Diagnosis Date  . Irritable bowel syndrome (IBS)   . Abdominal pain   . Fusion of spine Jan 2011    L4-S1 fusion  . Depression   . Anxiety   . Cancer     MOLE PRE  . GERD (gastroesophageal reflux disease)   . Headache     SINUS  . Arthritis     Past Surgical History  Procedure Date  . Spinal fusion jan 2011    L4-S1 fusion  . Abdominal hernia repair   . Ablation of spine   . Cholecystectomy   . Uterine ablation   . Abdominal hysterectomy 8/12  . Tonsillectomy   . Fracture surgery   . Cesarean section     Allergies  Allergen Reactions  . Penicillins Rash    History  Substance Use Topics  . Smoking status: Former Smoker -- 0.5 packs/day    Quit date: 11/13/2008  . Smokeless tobacco: Not on file  . Alcohol Use: 0.6 oz/week    1 Cans of beer per week    History reviewed. No pertinent family history. Prior to Admission medications   Medication Sig Start Date End Date Taking? Authorizing Provider  ALPRAZolam Prudy Feeler) 0.5 MG tablet Take 0.5 mg by mouth 3 (three) times daily as needed. For anxiety   Yes Historical Provider, MD  carisoprodol (SOMA) 350 MG tablet Take 350 mg by mouth 3 (three) times daily as needed. For muscle spasms   Yes Historical Provider, MD  doxycycline (VIBRAMYCIN) 100 MG capsule Take 100 mg by mouth 2 (two) times daily.   Yes Historical Provider, MD  gabapentin (NEURONTIN) 300 MG capsule Take 300 mg by mouth 3 (three) times  daily. Take 1 capsule in the morning and afternoon and 2 capsule at night   Yes Historical Provider, MD  HYDROcodone-acetaminophen (NORCO) 10-325 MG per tablet Take 1 tablet by mouth every 6 (six) hours as needed.     Yes Historical Provider, MD  Misc Natural Products (LAXATIVE FORMULA PO) Take 1 tablet by mouth daily as needed. For constipation   Yes Historical Provider, MD  omeprazole (PRILOSEC) 10 MG capsule Take 10 mg by mouth daily.     Yes Historical Provider, MD  oxyCODONE-acetaminophen (PERCOCET) 10-325 MG per tablet Take 1 tablet by mouth every 6 (six) hours as needed.   Yes Historical Provider, MD     Review of Systems  Positive ROS: Negative except as above  All other systems have been reviewed and were otherwise negative with the exception of those mentioned in the HPI and as above.  Objective: Vital signs in last 24 hours: Temp:  [98.2 F (36.8 C)] 98.2 F (36.8 C) (01/28 0756) Pulse Rate:  [78] 78  (01/28 0756) Resp:  [20] 20  (01/28 0756) BP: (110)/(72) 110/72 mmHg (01/28 0756) SpO2:  [97 %] 97 % (01/28 0756) Weight:  [71.8 kg (158 lb 4.6 oz)] 71.8 kg (158 lb 4.6 oz) (01/28 0900)  General Appearance: Alert, cooperative, no distress, appears stated age  Head: Normocephalic, without obvious abnormality, atraumatic Eyes: PERRL, conjunctiva/corneas clear, EOM's intact, fundi benign, both eyes      Ears: Normal TM's and external ear canals, both ears Throat: Lips, mucosa, and tongue normal; teeth and gums normal Neck: Supple, symmetrical, trachea midline, no adenopathy; thyroid: No enlargement/tenderness/nodules; no carotid bruit or JVD Back: Symmetric, no curvature, ROM normal, no CVA tenderness Lungs: Clear to auscultation bilaterally, respirations unlabored Heart: Regular rate and rhythm, S1 and S2 normal, no murmur, rub or gallop Abdomen: Soft, non-tender, bowel sounds active all four quadrants, no masses, no organomegaly. The patient's abdominal incision is  well-healed. Extremities: Extremities normal, atraumatic, no cyanosis or edema Pulses: 2+ and symmetric all extremities Skin: Skin color, texture, turgor normal, no rashes or lesions  NEUROLOGIC:   Mental status: alert and oriented, no aphasia, good attention span, Fund of knowledge/ memory ok Motor Exam - grossly normal Sensory Exam - grossly normal Reflexes:  Coordination - grossly normal Gait - grossly normal Balance - grossly normal Cranial Nerves: I: smell Not tested  II: visual acuity  OS: Normal    OD: Normal   II: visual fields Full to confrontation  II: pupils Equal, round, reactive to light  III,VII: ptosis None  III,IV,VI: extraocular muscles  Full ROM  V: mastication Normal  V: facial light touch sensation  Normal  V,VII: corneal reflex  Present  VII: facial muscle function - upper  Normal  VII: facial muscle function - lower Normal  VIII: hearing Not tested  IX: soft palate elevation  Normal  IX,X: gag reflex Present  XI: trapezius strength  5/5  XI: sternocleidomastoid strength 5/5  XI: neck flexion strength  5/5  XII: tongue strength  Normal    Data Review Lab Results  Component Value Date   WBC 4.7 11/14/2011   HGB 13.7 11/14/2011   HCT 39.0 11/14/2011   MCV 90.7 11/14/2011   PLT 239 11/14/2011   Lab Results  Component Value Date   NA 135 10/27/2009   K 3.4* 10/27/2009   CL 100 10/27/2009   CO2 31 10/27/2009   BUN 1* 10/27/2009   CREATININE 0.56 10/27/2009   GLUCOSE 120* 10/27/2009   No results found for this basename: INR, PROTIME    Assessment/Plan: L4-5 and L5-S1 pseudoarthrosis, lumbago, lumbar radiculopathy: I discussed situation with the patient and her mother. I reviewed the imaging studies with him. We have discussed the various treatment options including surgery. I described the surgical option of an L4-5 and L5-S1 posterior lateral arthrodesis and instrumentation with pedicle screws and rods. I described the surgery to them and showed surgical models.  We have discussed the risks, benefits, alternatives and likelihood of achieving our goals with surgery. I have answered all her questions. Patient was to proceed with surgery.   Eneida Evers D 11/21/2011 11:05 AM

## 2011-11-21 NOTE — Anesthesia Preprocedure Evaluation (Signed)
Anesthesia Evaluation  Patient identified by MRN, date of birth, ID band Patient awake    Reviewed: Allergy & Precautions, H&P , NPO status , Patient's Chart, lab work & pertinent test results  Airway Mallampati: I TM Distance: >3 FB Neck ROM: Full    Dental  (+) Teeth Intact and Dental Advisory Given   Pulmonary  clear to auscultation        Cardiovascular Regular Normal    Neuro/Psych    GI/Hepatic   Endo/Other    Renal/GU      Musculoskeletal   Abdominal   Peds  Hematology   Anesthesia Other Findings   Reproductive/Obstetrics                           Anesthesia Physical Anesthesia Plan  ASA: II  Anesthesia Plan: General   Post-op Pain Management:    Induction: Intravenous  Airway Management Planned: Oral ETT  Additional Equipment:   Intra-op Plan:   Post-operative Plan: Extubation in OR  Informed Consent: I have reviewed the patients History and Physical, chart, labs and discussed the procedure including the risks, benefits and alternatives for the proposed anesthesia with the patient or authorized representative who has indicated his/her understanding and acceptance.   Dental advisory given  Plan Discussed with: Anesthesiologist, Surgeon and CRNA  Anesthesia Plan Comments:         Anesthesia Quick Evaluation

## 2011-11-21 NOTE — Anesthesia Postprocedure Evaluation (Signed)
  Anesthesia Post-op Note  Patient: Maureen [name]  Procedure(s) Performed:  POSTERIOR LUMBAR FUSION 2 LEVEL - Lumbar four-five,Lumbar Five-Sacral One posterolateral arthrodesis with posterior segemental instrumentation  Patient Location: PACU  Anesthesia Type: General  Level of Consciousness: awake, alert  and oriented  Airway and Oxygen Therapy: Patient Spontanous Breathing and Patient connected to nasal cannula oxygen  Post-op Pain: moderate  Post-op Assessment: Post-op Vital signs reviewed, Patient's Cardiovascular Status Stable, Respiratory Function Stable, Patent Airway, No signs of Nausea or vomiting and Pain level controlled  Post-op Vital Signs: Reviewed and stable  Complications: No apparent anesthesia complications

## 2011-11-21 NOTE — Transfer of Care (Signed)
Immediate Anesthesia Transfer of Care Note  Patient: Maureen [name]  Procedure(s) Performed:  POSTERIOR LUMBAR FUSION 2 LEVEL - Lumbar four-five,Lumbar Five-Sacral One posterolateral arthrodesis with posterior segemental instrumentation  Patient Location: PACU  Anesthesia Type: General  Level of Consciousness: awake, alert , oriented and patient cooperative  Airway & Oxygen Therapy: Patient Spontanous Breathing and Patient connected to nasal cannula oxygen  Post-op Assessment: Report given to PACU RN and Post -op Vital signs reviewed and stable  Post vital signs: Reviewed and stable  Complications: No apparent anesthesia complications

## 2011-11-21 NOTE — Preoperative (Signed)
Beta Blockers   Reason not to administer Beta Blockers:Not Applicable 

## 2011-11-21 NOTE — OR Nursing (Signed)
Resting better after pain meds, at daily level 7/10. Interacting with staff, tolerating fluids well, using pca as needed

## 2011-11-22 LAB — BASIC METABOLIC PANEL
Chloride: 102 mEq/L (ref 96–112)
GFR calc Af Amer: >90 mL/min (ref >90)
GFR calc non Af Amer: >90 mL/min (ref >90)
Glucose, Bld: 109 mg/dL — ABNORMAL HIGH (ref 70–99)
Potassium: 3.6 mEq/L (ref 3.5–5.1)
Sodium: 138 mEq/L (ref 135–145)

## 2011-11-22 LAB — CBC
Hemoglobin: 11.4 g/dL — ABNORMAL LOW (ref 12.0–15.0)
MCHC: 34.1 g/dL (ref 30.0–36.0)
RDW: 12.3 % (ref 11.5–15.5)
WBC: 5.7 10*3/uL (ref 4.0–10.5)

## 2011-11-22 NOTE — Progress Notes (Signed)
Clinical Child psychotherapist received consult for "SNF." CSW reviewed chart and consulted with bedside RN and RNCM. RNCM will be arranging home health services at discharge. CSW is signing off as no further social work needs are identified. Please reconsult if a social work need arises prior to discharge.   Dede Query, MSW, Theresia Majors [phone]

## 2011-11-22 NOTE — Evaluation (Signed)
Occupational Therapy Evaluation Patient Details Name: Maureen [name] MRN: [id number] DOB: 10/24/1966 Today's Date: 11/22/2011 10:29-11:16  evII Problem List:  Patient Active Problem List  Diagnoses  . UNSPECIFIED DISORDER OF STOMACH AND DUODENUM    Past Medical History:  Past Medical History  Diagnosis Date  . Irritable bowel syndrome (IBS)   . Abdominal pain   . Fusion of spine Jan 2011    L4-S1 fusion  . Depression   . Anxiety   . Cancer     MOLE PRE  . GERD (gastroesophageal reflux disease)   . Headache     SINUS  . Arthritis    Past Surgical History:  Past Surgical History  Procedure Date  . Spinal fusion jan 2011    L4-S1 fusion  . Abdominal hernia repair   . Ablation of spine   . Cholecystectomy   . Uterine ablation   . Abdominal hysterectomy 8/12  . Tonsillectomy   . Fracture surgery   . Cesarean section     OT Assessment/Plan/Recommendation OT Assessment Clinical Impression Statement: Pleasant 46 yr old female admitted for second attempt lumbar fusion.  Pt limited by increased pain currently.  Needs min assist for functional transfers and min to mod assist for simulated selfcare tasks.  Will have 24 hour supervision at D/C from ex husband and children.  Will benefit from acute OT services to address these issues in order to increase overall independence to a supervision level.  Anticipate no follow-up OT needs. OT Recommendation/Assessment: Patient will need skilled OT in the acute care venue OT Problem List: Decreased strength;Decreased activity tolerance;Impaired balance (sitting and/or standing);Decreased knowledge of use of DME or AE;Pain OT Therapy Diagnosis : Generalized weakness;Acute pain OT Plan OT Frequency: Min 2X/week OT Treatment/Interventions: Self-care/ADL training;Therapeutic activities;DME and/or AE instruction;Balance training;Patient/family education OT Recommendation Follow Up Recommendations: No OT follow up Equipment Recommended: 3 in  1 bedside commode Individuals Consulted Consulted and Agree with Results and Recommendations: Patient OT Goals Acute Rehab OT Goals OT Goal Formulation: With patient ADL Goals Pt Will Perform Grooming: with supervision;Standing at sink (Following back precautions.) ADL Goal: Grooming - Progress: Goal set today Pt Will Perform Lower Body Bathing: with supervision;Sit to stand from bed;with adaptive equipment ADL Goal: Lower Body Bathing - Progress: Goal set today Pt Will Perform Lower Body Dressing: with supervision;with adaptive equipment;Sit to stand from bed ADL Goal: Lower Body Dressing - Progress: Goal set today Pt Will Transfer to Toilet: with supervision;3-in-1;with DME ADL Goal: Toilet Transfer - Progress: Goal set today Pt Will Perform Toileting - Clothing Manipulation: with supervision;Sitting on 3-in-1 or toilet;Standing ADL Goal: Toileting - Clothing Manipulation - Progress: Goal set today Pt Will Perform Toileting - Hygiene: with supervision;Sit to stand from 3-in-1/toilet ADL Goal: Toileting - Hygiene - Progress: Goal set today Pt Will Perform Tub/Shower Transfer: Tub transfer;Ambulation;with DME;with supervision ADL Goal: Tub/Shower Transfer - Progress: Goal set today  OT Evaluation Precautions/Restrictions  Precautions Precautions: Back Precaution Booklet Issued: No Precaution Comments: Pt given back precaution handout Required Braces or Orthoses: Yes Spinal Brace: Lumbar corset;Applied in sitting position Restrictions Weight Bearing Restrictions: No Prior Functioning Home Living Lives With: Daughter (Husband also going to stay and help for) Type of Home: House Home Layout: Two level;Able to live on main level with bedroom/bathroom;1/2 bath on main level Alternate Level Stairs-Rails: Right Alternate Level Stairs-Number of Steps: 10-12 Home Access: Stairs to enter Entrance Stairs-Rails: None Entrance Stairs-Number of Steps: 3-4 Bathroom Shower/Tub: Tub/shower  unit;Curtain Insurance claims handler Accessibility:  Yes Home Adaptive Equipment: Walker - rolling Prior Function Level of Independence: Independent with basic ADLs Driving: Yes Vocation: On disability ADL ADL Eating/Feeding: Simulated;Independent Where Assessed - Eating/Feeding: Chair Grooming: Performed;Wash/dry face;Teeth care;Brushing hair;Minimal assistance Where Assessed - Grooming: Standing at sink Upper Body Bathing: Simulated;Set up Where Assessed - Upper Body Bathing: Sitting, bed Lower Body Bathing: Simulated;Moderate assistance Where Assessed - Lower Body Bathing: Sit to stand from bed Upper Body Dressing: Performed;Maximal assistance Upper Body Dressing Details (indicate cue type and reason): Max assist for donning lumbar corsett Where Assessed - Upper Body Dressing: Sitting, bed;Unsupported Lower Body Dressing: Simulated;Maximal assistance Where Assessed - Lower Body Dressing: Sit to stand from bed Toilet Transfer: Simulated;Minimal assistance Toilet Transfer Details (indicate cue type and reason): Stand pivot with rolling walker. Toilet Transfer Method: Surveyor, minerals: Other (comment) (Bed to chair.  Unable to practice 3:1 secondary to BP.) Toileting - Clothing Manipulation: Simulated;Minimal assistance Toileting - Clothing Manipulation Details (indicate cue type and reason): Sit to stand from EOB. Where Assessed - Toileting Clothing Manipulation: Sit to stand from 3-in-1 or toilet Toileting - Hygiene: Simulated;Minimal assistance Where Assessed - Toileting Hygiene:  (Sit to stand from EOB.) Tub/Shower Transfer: Not assessed Tub/Shower Transfer Method: Not assessed Equipment Used: Rolling walker Ambulation Related to ADLs: Pt overall min assist for ambulation to the sink and back. ADL Comments: Pt overall min assist for for simulated selfcare.  Pt will need use of AE for LB selfcare.  Will educate next visit.  Pt will also need 3:1  for home as well. Vision/Perception  Vision - History Baseline Vision: No visual deficits Patient Visual Report: No change from baseline Vision - Assessment Vision Assessment: Vision not tested Perception Perception: Within Functional Limits Praxis Praxis: Intact Cognition Cognition Arousal/Alertness: Awake/alert Overall Cognitive Status: Appears within functional limits for tasks assessed Orientation Level: Oriented X4 Sensation/Coordination Sensation Light Touch: Appears Intact Stereognosis: Not tested Hot/Cold: Not tested Proprioception: Not tested Coordination Gross Motor Movements are Fluid and Coordinated: Yes Extremity Assessment RUE Assessment RUE Assessment: Within Functional Limits LUE Assessment LUE Assessment: Within Functional Limits Mobility  Bed Mobility Bed Mobility: Yes Rolling Right: 4: Min assist Rolling Right Details (indicate cue type and reason): Tactile/verbal cues for sequence and knee/hip flexion for log roll.  Rolling Left: 4: Min assist Rolling Left Details (indicate cue type and reason): Tactile/verbal cues for sequence and knee/hip flexion for log roll.  Left Sidelying to Sit: 3: Mod assist;HOB flat;With rails Left Sidelying to Sit Details (indicate cue type and reason): (A) secondary to pain. Verbal cues for efficiency, sequence,  and pain modulation.  Sitting - Scoot to Edge of Bed: 5: Supervision Sitting - Scoot to [place] of Bed Details (indicate cue type and reason): Max verbal cues to initiate.  Sit to Sidelying Left: 4: Min assist Sit to Sidelying Left Details (indicate cue type and reason): (A) for bil. LEs. Tactile/Verbal cues to maintain precautions.  Transfers Transfers: Yes Sit to Stand: 4: Min assist;With upper extremity assist;From bed Sit to Stand Details (indicate cue type and reason): Verbal cues for UE placement.  Stand to Sit: 4: Min assist Stand to Sit Details: (A) to control descent. Verbal cues for UE placement.   End of  Session OT - End of Session Equipment Utilized During Treatment: Gait belt;Other (comment) (rolling walker) Activity Tolerance: Patient limited by pain Patient left: in bed;with call bell in reach Nurse Communication: Mobility status for transfers General Behavior During Session: [facility] for tasks performed Cognition: [facility] for  tasks performed   Sir Mallis OTR/L 11/22/2011, 1:23 PM  Pager number [phone]

## 2011-11-22 NOTE — Progress Notes (Signed)
Patient ID: Maureen [name], female   DOB: Jul 13, 1966, 46 y.o.   MRN: [id number] Subjective:  The patient is alert and pleasant. Her back is appropriately sore. She looks well.  Objective: Vital signs in last 24 hours: Temp:  [97.4 F (36.3 C)-99.5 F (37.5 C)] 99.5 F (37.5 C) (01/29 0600) Pulse Rate:  [78-99] 85  (01/29 0600) Resp:  [10-20] 19  (01/29 0600) BP: (90-110)/(61-73) 90/61 mmHg (01/29 0600) SpO2:  [95 %-100 %] 100 % (01/29 0600) Weight:  [71.8 kg (158 lb 4.6 oz)] 71.8 kg (158 lb 4.6 oz) (01/28 1613)  Intake/Output from previous day: 01/28 0701 - 01/29 0700 In: 2840 [P.O.:240; I.V.:2100; IV Piggyback:500] Out: 2625 [Urine:2500; Blood:125] Intake/Output this shift:    Physical exam the patient is alert and oriented. Her strength is normal in her lower extremities.  Lab Results:  Basename 11/22/11 0600  WBC 5.7  HGB 11.4*  HCT 33.4*  PLT 170   BMET  Basename 11/22/11 0600  NA 138  K 3.6  CL 102  CO2 32  GLUCOSE 109*  BUN 4*  CREATININE 0.64  CALCIUM 8.5    Studies/Results: Dg Lumbar Spine 2-3 Views  11/21/2011  *RADIOLOGY REPORT*  Clinical Data: L4-S1 PLIF  LUMBAR SPINE - 2-3 VIEW  Comparison: Lumbar spine radiographs 08/23/2011 and intraoperative localization 2013  Findings: Two intraoperative spot views of the lower lumbar spine are submitted, in the anterior and lateral projections.  Bilateral pedicle screws are seen bilaterally L4-L5, in their posterior there are bilateral posterior screws at S1. Preexisting anterior fusion at L4-5 and L5-S1 again noted.  IMPRESSION: Intraoperative radiographs show posterior fusion spanning L4-S1.  Original Report Authenticated By: Britta Mccreedy, M.D.   Dg Lumbar Spine 1 View  11/21/2011  *RADIOLOGY REPORT*  Clinical Data: L4-S1 posterior fusion.  LUMBAR SPINE - 1 VIEW  Comparison: 10/05/2011 CT  Findings: Remote changes of anterior fusion noted at L4-5 and L5- S1.  Posterior localizing instruments are at the L5-S1 level.   IMPRESSION: Intraoperative localization as above.  Original Report Authenticated By: Cyndie Chime, M.D.    Assessment/Plan: Postop day 1: The patient is doing well we will mobilize her with PT and OT and discontinue the Foley catheter.  LOS: 1 day     Aniyah Nobis D 11/22/2011, 7:27 AM

## 2011-11-22 NOTE — Progress Notes (Signed)
   CARE MANAGEMENT NOTE 11/22/2011  Patient:  Maureen [name], Maureen [name]   Account Number:  [id number]  Date Initiated:  11/22/2011  Documentation initiated by:  [facility]  Subjective/Objective Assessment:   POSTERIOR LUMBAR FUSION 2 LEVEL - Lumbar four-five,Lumbar Five-Sacral One posterolateral arthrodesis with posterior segemental instrumentation     Action/Plan:   has RW at home   Anticipated DC Date:  11/24/2011   Anticipated DC Plan:  HOME W HOME HEALTH SERVICES      DC Planning Services  CM consult      [facility] Choice  HOME HEALTH   Choice offered to / List presented to:  C-1 Patient           HH agency  [facility]   Status of service:  In process, will continue to follow Medicare Important Message given?   (If response is "NO", the following Medicare IM given date fields will be blank) Date Medicare IM given:   Date Additional Medicare IM given:    Discharge Disposition:    Per UR Regulation:    Comments:  11/22/2011 1430 PT is recommending HH PT. Spoke to pt and states she has RW at home. She is requesting 3n1. Provided pt with [facility] agency list for [facility] PT. Message left for MD for order and F2F for [facility] PT. Pt requested [facility] for [facility] services. NCM will set up [facility] and order DME once orders received. Waiting orders. Isidoro Donning RN CCM Case Mgmt phone [phone]

## 2011-11-22 NOTE — Evaluation (Signed)
Physical Therapy Evaluation Patient Details Name: Maureen [name] MRN: [id number] DOB: April 15, 1966 Today's Date: 11/22/2011  Problem List:  Patient Active Problem List  Diagnoses  . UNSPECIFIED DISORDER OF STOMACH AND DUODENUM    Past Medical History:  Past Medical History  Diagnosis Date  . Irritable bowel syndrome (IBS)   . Abdominal pain   . Fusion of spine Jan 2011    L4-S1 fusion  . Depression   . Anxiety   . Cancer     MOLE PRE  . GERD (gastroesophageal reflux disease)   . Headache     SINUS  . Arthritis    Past Surgical History:  Past Surgical History  Procedure Date  . Spinal fusion jan 2011    L4-S1 fusion  . Abdominal hernia repair   . Ablation of spine   . Cholecystectomy   . Uterine ablation   . Abdominal hysterectomy 8/12  . Tonsillectomy   . Fracture surgery   . Cesarean section     PT Assessment/Plan/Recommendation PT Assessment Clinical Impression Statement: 46 year old female s/p  L4-S24fusion. Pt presents with significant pain and decreased activity tolerance. Pre session pt's BP = 113/77, after ambulation pt was lightheaded and found to have BP of 74/44. Pt was returned to supine and BP increased to 116/80. Pt unable to tolerate sitting in chair today secondary to BP. RN aware. Pt will benefit from acute PT for the deficits listed below.  PT Recommendation/Assessment: Patient will need skilled PT in the acute care venue PT Problem List: Decreased activity tolerance;Decreased mobility;Pain;Decreased knowledge of precautions;Decreased knowledge of use of DME;Cardiopulmonary status limiting activity PT Therapy Diagnosis : Difficulty walking;Abnormality of gait;Acute pain PT Plan PT Frequency: Min 6X/week PT Treatment/Interventions: Functional mobility training;Stair training;Gait training;DME instruction;Therapeutic activities;Therapeutic exercise;Patient/family education PT Recommendation Follow Up Recommendations: Home health PT;Supervision/Assistance  - 24 hour Equipment Recommended: Rolling walker with 5" wheels;3 in 1 bedside commode (pending progress) PT Goals  Acute Rehab PT Goals PT Goal Formulation: With patient Time For Goal Achievement: 7 days Pt will Roll Supine to Right Side: with modified independence PT Goal: Rolling Supine to Right Side - Progress: Not met Pt will Roll Supine to Left Side: with modified independence PT Goal: Rolling Supine to Left Side - Progress: Not met Pt will go Sit to Supine/Side: with modified independence PT Goal: Sit to Supine/Side - Progress: Not met Pt will go Sit to Stand: with modified independence PT Goal: Sit to Stand - Progress: Not met Pt will go Stand to Sit: with modified independence PT Goal: Stand to Sit - Progress: Not met Pt will Ambulate: 51 - 150 feet;with supervision;with least restrictive assistive device PT Goal: Ambulate - Progress: Not met Pt will Go Up / Down Stairs: 3-5 stairs;with least restrictive assistive device;with supervision PT Goal: Up/Down Stairs - Progress: Not met Additional Goals Additional Goal #1: Tolerate sitting in chair for 1.5 hours continuously with pain in tolerable range and BP WNL.  PT Goal: Additional Goal #1 - Progress: Not met  PT Evaluation Precautions/Restrictions  Precautions Precautions: Back Precaution Booklet Issued: No Precaution Comments: Pt given back precaution handout Required Braces or Orthoses: Yes Spinal Brace: Lumbar corset;Applied in sitting position Restrictions Weight Bearing Restrictions: No Prior Functioning  Home Living Lives With: Daughter (Husband also going to stay and help for) Type of Home: House Home Layout: Two level;Able to live on main level with bedroom/bathroom;1/2 bath on main level Alternate Level Stairs-Rails: Right Alternate Level Stairs-Number of Steps: 10-12 Home Access: Stairs  to enter Entrance Stairs-Rails: None Entrance Stairs-Number of Steps: 3-4 Bathroom Shower/Tub: Tub/shower  unit;Curtain Firefighter: Standard Bathroom Accessibility: Yes Home Adaptive Equipment: Walker - rolling Prior Function Level of Independence: Independent with basic ADLs Driving: Yes Vocation: On disability Cognition Cognition Arousal/Alertness: Awake/alert Overall Cognitive Status: Appears within functional limits for tasks assessed Orientation Level: Oriented X4 Sensation/Coordination Sensation Light Touch: Appears Intact (Pt reporting decreased sensation of Rt. LE) Coordination Gross Motor Movements are Fluid and Coordinated: Yes Extremity Assessment RLE Assessment RLE Assessment: Within Functional Limits LLE Assessment LLE Assessment: Within Functional Limits Mobility (including Balance) Bed Mobility Bed Mobility: Yes Rolling Right: 4: Min assist Rolling Right Details (indicate cue type and reason): Tactile/verbal cues for sequence and knee/hip flexion for log roll.  Rolling Left: 4: Min assist Rolling Left Details (indicate cue type and reason): Tactile/verbal cues for sequence and knee/hip flexion for log roll.  Left Sidelying to Sit: 3: Mod assist Left Sidelying to Sit Details (indicate cue type and reason): (A) secondary to pain. Verbal cues for efficiency, sequence,  and pain modulation.  Sitting - Scoot to Edge of Bed: 5: Supervision Sitting - Scoot to [place] of Bed Details (indicate cue type and reason): Max verbal cues to initiate.  Sit to Sidelying Left: 4: Min assist Sit to Sidelying Left Details (indicate cue type and reason): (A) for bil. LEs. Tactile/Verbal cues to maintain precautions.  Transfers Transfers: Yes Sit to Stand: 4: Min assist;With upper extremity assist;From chair/3-in-1;From bed;With armrests Sit to Stand Details (indicate cue type and reason): Verbal cues for UE placement.  Stand to Sit: 4: Min assist Stand to Sit Details: (A) to control descent. Verbal cues for UE placement.  Stand Pivot Transfers: 4: Min assist Stand Pivot Transfer  Details (indicate cue type and reason): (A) secondary to lightheadedness due to decreased BP.  Ambulation/Gait Ambulation/Gait: Yes Ambulation/Gait Assistance: 4: Min assist Ambulation/Gait Assistance Details (indicate cue type and reason): Pt with minimal increased sway. Verbal cues for appropriate distance of RW, PT to direct RW at times. Verbal cues for upright posture.  Ambulation Distance (Feet): 16 Feet Assistive device: Rolling walker Gait Pattern: Shuffle;Trunk flexed;Decreased dorsiflexion - right;Decreased dorsiflexion - left (Minimal foot clearance bilaterally. ) Gait velocity: Very slow cadence with multiple standing rest breaks.  Stairs: No  Posture/Postural Control Posture/Postural Control: No significant limitations Balance Balance Assessed: Yes Static Sitting Balance Static Sitting - Balance Support: Right upper extremity supported;Feet supported Static Sitting - Level of Assistance: 5: Stand by assistance Static Standing Balance Static Standing - Balance Support: No upper extremity supported Static Standing - Level of Assistance: Other (comment) (min-guard assist) Static Standing - Comment/# of Minutes: Pt standing at sink brushing teeth. minimal increased sway noticed, pt with appropriate corrective reactions.  Exercise  General Exercises - Lower Extremity Ankle Circles/Pumps: AROM;Both;20 reps;Supine;Seated End of Session PT - End of Session Equipment Utilized During Treatment: Gait belt;Back brace Activity Tolerance: Patient limited by fatigue;Patient limited by pain;Treatment limited secondary to medical complications (Comment) (low BP) Patient left: in bed;with call bell in reach Nurse Communication: Mobility status for transfers;Mobility status for ambulation (Low BP) General Behavior During Session: [facility] for tasks performed Cognition: [facility] for tasks performed  Sherie Don 11/22/2011, 12:42 PM  Sherie Don) Carleene Mains PT, DPT Acute Rehabilitation [phone]

## 2011-11-23 LAB — URINALYSIS, ROUTINE W REFLEX MICROSCOPIC
Glucose, UA: NEGATIVE mg/dL
Leukocytes, UA: NEGATIVE
Protein, ur: NEGATIVE mg/dL
Specific Gravity, Urine: 1.002 — ABNORMAL LOW (ref 1.005–1.030)

## 2011-11-23 LAB — URINE MICROSCOPIC-ADD ON

## 2011-11-23 MED ORDER — BISACODYL 10 MG RE SUPP
10.0000 mg | Freq: Every day | RECTAL | Status: DC | PRN
  Administered 2011-11-24: 10 mg via RECTAL
  Filled 2011-11-23: qty 1

## 2011-11-23 MED ORDER — FLUCONAZOLE 150 MG PO TABS
150.0000 mg | ORAL_TABLET | Freq: Every day | ORAL | Status: DC
  Administered 2011-11-23 – 2011-11-25 (×3): 150 mg via ORAL
  Filled 2011-11-23 (×3): qty 1

## 2011-11-23 MED ORDER — MORPHINE SULFATE 2 MG/ML IJ SOLN
2.0000 mg | INTRAMUSCULAR | Status: DC | PRN
  Administered 2011-11-23 – 2011-11-24 (×3): 2 mg via INTRAVENOUS
  Administered 2011-11-24: 3 mg via INTRAVENOUS
  Administered 2011-11-24: 4 mg via INTRAVENOUS
  Filled 2011-11-23 (×3): qty 1
  Filled 2011-11-23: qty 2
  Filled 2011-11-23 (×2): qty 1
  Filled 2011-11-23: qty 2

## 2011-11-23 NOTE — Progress Notes (Signed)
Second Bladder scan revealed , cath = 1000.  Per protocol next cath should reinsert foley.  Patient also is experiencing vaginal yeast discharge., denies itching, but urinary frequency persists.  States has been on antibiotics since her hysterectomy approx. 1 month ago. Also experiencing some bladder spasms and abd. tenderness / pain.

## 2011-11-23 NOTE — Progress Notes (Signed)
Frequency experienced.  Bladder Scan =   In & Out cath done and obtained 1000 cc yellow, clear urine with no odor.  Will continue to monitor.

## 2011-11-23 NOTE — Progress Notes (Signed)
Physical Therapy Treatment Patient Details Name: Maureen [name] MRN: [id number] DOB: 03/20/1966 Today's Date: 11/23/2011  PT Assessment/Plan  PT - Assessment/Plan Comments on Treatment Session: Pt with greater activity tolerance today.  She continues to c/o pain in the right hip and back pain with all changes of position.  Continue to rec HHPT at d/c. PT Plan: Discharge plan remains appropriate;Frequency remains appropriate PT Frequency: Min 6X/week Follow Up Recommendations: Home health PT;Supervision/Assistance - 24 hour Equipment Recommended: 3 in 1 bedside comode PT Goals  Acute Rehab PT Goals PT Goal Formulation: With patient Time For Goal Achievement: 7 days Pt will Roll Supine to Right Side: with modified independence PT Goal: Rolling Supine to Right Side - Progress: Other (comment) (NT) Pt will Roll Supine to Left Side: with modified independence PT Goal: Rolling Supine to Left Side - Progress: Progressing toward goal Pt will go Sit to Supine/Side: with modified independence PT Goal: Sit to Supine/Side - Progress: Progressing toward goal Pt will go Sit to Stand: with modified independence PT Goal: Sit to Stand - Progress: Progressing toward goal Pt will go Stand to Sit: with modified independence PT Goal: Stand to Sit - Progress: Progressing toward goal Pt will Ambulate: with modified independence;>150 feet;with least restrictive assistive device PT Goal: Ambulate - Progress: Updated due to goal met Pt will Go Up / Down Stairs: 3-5 stairs;with least restrictive assistive device;with supervision PT Goal: Up/Down Stairs - Progress: Progressing toward goal Additional Goals Additional Goal #1: Tolerate sitting in chair for 1.5 hours continuously with pain in tolerable range and BP WNL.  PT Goal: Additional Goal #1 - Progress: Other (comment) (NT today)  PT Treatment Precautions/Restrictions  Precautions Precautions: Back Precaution Booklet Issued: No Precaution Comments: Pt  verbalized bending and twisting, needed visual cues to recall arching Required Braces or Orthoses: Yes Spinal Brace: Lumbar corset;Applied in sitting position Restrictions Weight Bearing Restrictions: No Mobility (including Balance) Bed Mobility Bed Mobility: Yes Rolling Left: 5: Supervision;With rail Rolling Left Details (indicate cue type and reason): vc's for log roll Left Sidelying to Sit: HOB flat;With rails;5: Supervision Left Sidelying to Sit Details (indicate cue type and reason): pt experiences increased pain with all changes of position but performs mvmt at supervision level. Sitting - Scoot to Edge of Bed: 6: Modified independent (Device/Increase time) Sit to Sidelying Left: 5: Supervision;HOB flat Sit to Sidelying Left Details (indicate cue type and reason): vc's to stay in SL until legs on bed, before rolling onto her back Transfers Transfers: Yes Sit to Stand: 4: Min assist;From bed;With upper extremity assist (min-guard) Sit to Stand Details (indicate cue type and reason): vc's for pt to place hands on bed but pt unable to stand without one hand up on RW.  Ptlifts chest and gets into upright posture then straightens knees.  Not fluid motion. Stand to Sit: 5: Supervision;To bed;With upper extremity assist Ambulation/Gait Ambulation/Gait: Yes Ambulation/Gait Assistance: 5: Supervision Ambulation/Gait Assistance Details (indicate cue type and reason): Antalgic gait and pt needs encouragement to increase distance Ambulation Distance (Feet): 150 Feet Assistive device: Rolling walker Gait Pattern: Step-through pattern Gait velocity: decreased cadence, no rest breaks taken today Stairs: Yes Stairs Assistance: 4: Min assist Stairs Assistance Details (indicate cue type and reason): pt unstead with SL stance up to next step, min A given Stair Management Technique: Two rails;Step to pattern;Forwards Number of Stairs: 2  Height of Stairs: 6  Wheelchair Mobility Wheelchair  Mobility: No  Posture/Postural Control Posture/Postural Control: No significant limitations Balance Balance Assessed: Yes  Static Standing Balance Static Standing - Balance Support: Right upper extremity supported Static Standing - Level of Assistance: 5: Stand by assistance    End of Session PT - End of Session Equipment Utilized During Treatment: Gait belt;Back brace Activity Tolerance: Patient limited by pain Patient left: in bed;with call bell in reach Nurse Communication: Mobility status for ambulation General Behavior During Session: [facility] for tasks performed Cognition: [facility] for tasks performed  Sanari Offner, [place] [phone] 11/23/2011, 1:31 PM

## 2011-11-23 NOTE — Progress Notes (Signed)
Patient ID: Maureen [name], female   DOB: Mar 03, 1966, 46 y.o.   MRN: [id number] Subjective:  The patient is alert and pleasant. She has been having urinary retention. She thinks she may have a yeast infection. She looks well. The patient complains of constipation.  Objective: Vital signs in last 24 hours: Temp:  [98.7 F (37.1 C)-100.4 F (38 C)] 98.8 F (37.1 C) (01/30 1527) Pulse Rate:  [96-110] 105  (01/30 1527) Resp:  [15-20] 18  (01/30 1527) BP: (96-113)/(52-77) 99/65 mmHg (01/30 1527) SpO2:  [93 %-100 %] 97 % (01/30 1527)  Intake/Output from previous day: 01/29 0701 - 01/30 0700 In: 2600 [P.O.:1800; I.V.:800] Out: 4000 [Urine:4000] Intake/Output this shift: Total I/O In: 360 [P.O.:360] Out: 1290 [Urine:1290]  Physical exam the patient is alert and oriented. Her lower extremity strength is normal.  Lab Results:  Basename 11/22/11 0600  WBC 5.7  HGB 11.4*  HCT 33.4*  PLT 170   BMET  Basename 11/22/11 0600  NA 138  K 3.6  CL 102  CO2 32  GLUCOSE 109*  BUN 4*  CREATININE 0.64  CALCIUM 8.5    Studies/Results: No results found.  Assessment/Plan: Postop day #2: The patient is doing well neurologically and making progress.  Urinary retention: We will check a urinalysis and culture. The Foley is back in. We will attempt to discontinue it again tomorrow.  Vaginal candidiasis: Started the patient on Diflucan.  Constipation: I'll add when necessary Dulcolax suppository.  LOS: 2 days     Kaitlinn Iversen D 11/23/2011, 4:32 PM

## 2011-11-23 NOTE — Progress Notes (Signed)
Occupational Therapy Treatment Patient Details Name: MAUDELL STANBROUGH MRN: [id number] DOB: May 14, 1966 Today's Date: 11/23/2011  OT Assessment/Plan OT Assessment/Plan OT Plan: Discharge plan remains appropriate OT Frequency: Min 2X/week Follow Up Recommendations: No OT follow up Equipment Recommended: 3 in 1 bedside comode OT Goals Acute Rehab OT Goals OT Goal Formulation: With patient Time For Goal Achievement: 7 days ADL Goals Pt Will Perform Grooming: with supervision;Standing at sink ADL Goal: Grooming - Progress: Met Pt Will Perform Lower Body Bathing: with supervision;Sit to stand from bed;with adaptive equipment ADL Goal: Lower Body Bathing - Progress: Met Pt Will Perform Lower Body Dressing: with supervision;with adaptive equipment;Sit to stand from bed ADL Goal: Lower Body Dressing - Progress: Met Pt Will Transfer to Toilet: with supervision;3-in-1;with DME ADL Goal: Toilet Transfer - Progress: Met Pt Will Perform Toileting - Clothing Manipulation: with supervision;Sitting on 3-in-1 or toilet;Standing ADL Goal: Toileting - Clothing Manipulation - Progress: Met Pt Will Perform Toileting - Hygiene: with supervision;Sit to stand from 3-in-1/toilet ADL Goal: Toileting - Hygiene - Progress: Met Pt Will Perform Tub/Shower Transfer: Tub transfer;Ambulation;with DME;with supervision ADL Goal: Tub/Shower Transfer - Progress: Progressing toward goals  OT Treatment Precautions/Restrictions  Precautions Precautions: Back Precaution Booklet Issued: No Precaution Comments: Pt verbalized bending and twisting, needed visual cues to recall arching Required Braces or Orthoses: Yes Spinal Brace: Lumbar corset;Applied in sitting position Restrictions Weight Bearing Restrictions: No   ADL ADL Grooming: Simulated;Wash/dry hands;Modified independent Where Assessed - Grooming: Standing at sink Upper Body Bathing: Simulated;Chest;Right arm;Left arm;Abdomen;Modified independent Where  Assessed - Upper Body Bathing: Sitting, chair;Supported Lower Body Bathing: Simulated;Modified independent Where Assessed - Lower Body Bathing: Sit to stand from chair;Other (comment) (AE return demo) Upper Body Dressing: Simulated;Supervision/safety Where Assessed - Upper Body Dressing: Sitting, chair;Supported Lower Body Dressing: Performed;Modified independent Where Assessed - Lower Body Dressing: Sit to stand from chair Toilet Transfer: Simulated;Modified independent Toilet Transfer Method: Proofreader: Raised toilet seat with arms (or 3-in-1 over toilet) Tub/Shower Transfer: Paramedic Details (indicate cue type and reason): stepping over trash can with hand held (A) Tub/Shower Transfer Method: Ambulating Equipment Used: Rolling walker Ambulation Related to ADLs: Pt overall Mod I with AE and adequate level for d/c Mobility  Bed Mobility Bed Mobility: No Rolling Left: 5: Supervision;With rail Rolling Left Details (indicate cue type and reason): vc's for log roll Left Sidelying to Sit: HOB flat;With rails;5: Supervision Left Sidelying to Sit Details (indicate cue type and reason): pt experiences increased pain with all changes of position but performs mvmt at supervision level. Sitting - Scoot to Edge of Bed: 6: Modified independent (Device/Increase time) Sit to Sidelying Left: 5: Supervision;HOB flat Sit to Sidelying Left Details (indicate cue type and reason): vc's to stay in SL until legs on bed, before rolling onto her back Transfers Transfers: Yes Sit to Stand: 6: Modified independent (Device/Increase time);With upper extremity assist;From chair/3-in-1 Sit to Stand Details (indicate cue type and reason): vc's for pt to place hands on bed but pt unable to stand without one hand up on RW.  Ptlifts chest and gets into upright posture then straightens knees.  Not fluid motion. Stand to Sit: 6: Modified independent  (Device/Increase time);To chair/3-in-1 Exercises    End of Session OT - End of Session Equipment Utilized During Treatment: Gait belt;Back brace Activity Tolerance: Patient tolerated treatment well Patient left: in chair;with call bell in reach Environmental consultant present and getting prune juice) Nurse Communication: Mobility status for transfers General Behavior During Session: [facility]  for tasks performed Cognition: [facility] for tasks performed  Lucile Shutters  11/23/2011, 3:48 PM Pager: [phone]

## 2011-11-24 LAB — URINE CULTURE
Colony Count: NO GROWTH
Culture: NO GROWTH

## 2011-11-24 MED ORDER — PROMETHAZINE HCL 25 MG/ML IJ SOLN
25.0000 mg | Freq: Four times a day (QID) | INTRAMUSCULAR | Status: DC | PRN
  Filled 2011-11-24: qty 1

## 2011-11-24 MED ORDER — PROMETHAZINE HCL 25 MG PO TABS
25.0000 mg | ORAL_TABLET | Freq: Four times a day (QID) | ORAL | Status: DC | PRN
  Administered 2011-11-24: 25 mg via ORAL
  Filled 2011-11-24: qty 1

## 2011-11-24 MED ORDER — MORPHINE SULFATE 4 MG/ML IJ SOLN
8.0000 mg | INTRAMUSCULAR | Status: DC | PRN
  Administered 2011-11-24 – 2011-11-25 (×4): 8 mg via INTRAMUSCULAR
  Filled 2011-11-24 (×4): qty 2

## 2011-11-24 MED ORDER — TAMSULOSIN HCL 0.4 MG PO CAPS
0.4000 mg | ORAL_CAPSULE | Freq: Every day | ORAL | Status: DC
  Administered 2011-11-24 – 2011-11-25 (×2): 0.4 mg via ORAL
  Filled 2011-11-24 (×2): qty 1

## 2011-11-24 NOTE — Progress Notes (Signed)
Around 2120,11/23/11,  pt received 2mg  of Morphine through new IV in left forearm. After giving Morphine, skin above forearm IV site immediately reddened and hives developed. The reaction was localized to that area. The hives did not spread. Pt stated she has been getting Morphine throughout hospital stay and has never had this reaction. Pt was given icepack to place over area. After twenty minutes, area was completely clear. Pt did not complain of any pain, only slight itching at the area at the time. Pt's IV site will continue to be monitored. The incident was reported to day shift.   Salvadore Oxford, RN 11/24/11 [phone]

## 2011-11-24 NOTE — Progress Notes (Signed)
Patient ID: Maureen [name], female   DOB: 02/23/1966, 46 y.o.   MRN: [id number] Subjective:  The patient is alert and pleasant.  Objective: Vital signs in last 24 hours: Temp:  [97.5 F (36.4 C)-99.6 F (37.6 C)] 97.5 F (36.4 C) (01/31 1040) Pulse Rate:  [87-105] 87  (01/31 1040) Resp:  [18] 18  (01/31 1040) BP: (96-119)/(62-78) 112/67 mmHg (01/31 1040) SpO2:  [93 %-100 %] 98 % (01/31 1040)  Intake/Output from previous day: 01/30 0701 - 01/31 0700 In: 360 [P.O.:360] Out: 5040 [Urine:5040] Intake/Output this shift: Total I/O In: -  Out: 1700 [Urine:1700]  Physical exam the patient is alert and oriented. Her lower extremity strength is normal. Her dressing is clean and dry.  Lab Results:  Basename 11/22/11 0600  WBC 5.7  HGB 11.4*  HCT 33.4*  PLT 170   BMET  Basename 11/22/11 0600  NA 138  K 3.6  CL 102  CO2 32  GLUCOSE 109*  BUN 4*  CREATININE 0.64  CALCIUM 8.5    Studies/Results: No results found.  Assessment/Plan: Postop day #3: The patient is doing well and we'll likely go home tomorrow.  Urinary retention: We just discontinue the Foley. Hopefully she'll urinate normally. Her urinalysis is okay  LOS: 3 days     Maureen [name] D 11/24/2011, 3:23 PM

## 2011-11-24 NOTE — Progress Notes (Signed)
Physical Therapy Treatment Patient Details Name: Maureen [name] MRN: [id number] DOB: 1966/07/06 Today's Date: 11/24/2011  PT Assessment/Plan  PT - Assessment/Plan Comments on Treatment Session: Pt with good mobility today, less limited by pain than in previous sessions. Pt at a supervision level for all mobility, although still limited in speed secondary to pain PT Plan: Discharge plan remains appropriate;Frequency remains appropriate PT Frequency: Min 6X/week Follow Up Recommendations: Home health PT;Supervision/Assistance - 24 hour Equipment Recommended: 3 in 1 bedside comode;Rolling walker with 5" wheels PT Goals  Acute Rehab PT Goals PT Goal Formulation: With patient PT Goal: Rolling Supine to Right Side - Progress: Progressing toward goal PT Goal: Rolling Supine to Left Side - Progress: Progressing toward goal PT Goal: Sit to Supine/Side - Progress: Progressing toward goal PT Goal: Sit to Stand - Progress: Met PT Goal: Stand to Sit - Progress: Met PT Goal: Ambulate - Progress: Progressing toward goal PT Goal: Up/Down Stairs - Progress: Progressing toward goal Additional Goals PT Goal: Additional Goal #1 - Progress: Progressing toward goal  PT Treatment Precautions/Restrictions  Precautions Precautions: Back Precaution Booklet Issued: No Precaution Comments: Pt able to recall 3/3 back precautions although required cueing to avoid twisting during functional activity Required Braces or Orthoses: Yes Spinal Brace: Lumbar corset;Applied in sitting position Restrictions Weight Bearing Restrictions: No Mobility (including Balance) Bed Mobility Bed Mobility: Yes Rolling Left: 5: Supervision;With rail Rolling Left Details (indicate cue type and reason): VC to maintain back precautions during rolling Left Sidelying to Sit: 5: Supervision;HOB elevated (comment degrees);With rails (20) Left Sidelying to Sit Details (indicate cue type and reason): VC for sequencing to maintain back  precautions.Pt completed transfer without physical assist, although slow secondary to pain Sitting - Scoot to Edge of Bed: 6: Modified independent (Device/Increase time) Transfers Transfers: Yes Sit to Stand: 6: Modified independent (Device/Increase time);With upper extremity assist;From chair/3-in-1 Sit to Stand Details (indicate cue type and reason): pt completed transfer slow Stand to Sit: 6: Modified independent (Device/Increase time);To chair/3-in-1 Ambulation/Gait Ambulation/Gait: Yes Ambulation/Gait Assistance: 5: Supervision Ambulation/Gait Assistance Details (indicate cue type and reason): Supervision assist secondary to pt with antalgic gait. VC for distance to RW for safety and support to back. VC throughout turning to prevent twisting Ambulation Distance (Feet): 200 Feet Assistive device: Rolling walker Gait Pattern: Step-through pattern;Decreased hip/knee flexion - right;Decreased hip/knee flexion - left;Antalgic Gait velocity: Decreased cadence Stairs: Yes Stairs Assistance: 5: Supervision Stairs Assistance Details (indicate cue type and reason): Supervision for safety. VC throughout for sequencing Stair Management Technique: One rail Right;Forwards;Step to pattern Number of Stairs: 2  Height of Stairs: 6     Exercise    End of Session PT - End of Session Equipment Utilized During Treatment: Gait belt;Back brace Activity Tolerance: Patient tolerated treatment well Patient left: in chair;with call bell in reach Nurse Communication: Mobility status for transfers;Mobility status for ambulation General Behavior During Session: [facility] for tasks performed Cognition: [facility] for tasks performed  Milana Kidney 11/24/2011, 1:20 PM  11/24/2011 Milana Kidney DPT PAGER: [phone] OFFICE: [phone]

## 2011-11-25 MED ORDER — OXYCODONE-ACETAMINOPHEN 10-325 MG PO TABS: 1.0000 | ORAL_TABLET | ORAL | Status: AC | PRN

## 2011-11-25 MED ORDER — DIAZEPAM 5 MG PO TABS: 5.0000 mg | ORAL_TABLET | Freq: Four times a day (QID) | ORAL | Status: AC | PRN

## 2011-11-25 MED ORDER — PROMETHAZINE HCL 25 MG PO TABS: 25.0000 mg | ORAL_TABLET | Freq: Four times a day (QID) | ORAL | Status: DC | PRN

## 2011-11-25 MED ORDER — TAMSULOSIN HCL 0.4 MG PO CAPS: 0.4000 mg | ORAL_CAPSULE | Freq: Every day | ORAL | Status: DC

## 2011-11-25 NOTE — Progress Notes (Signed)
Physical Therapy Treatment Patient Details Name: Maureen [name] MRN: [id number] DOB: [birth year] Today's Date: 11/25/2011  PT Assessment/Plan  PT - Assessment/Plan Comments on Treatment Session: Pt educated on ways to complete toilet hygiene while maintaining back precautions as well as ways to get in/out of the car safely. Simulated and completed car transfer on bed. Pt is safe to d/c home today PT Plan: Discharge plan remains appropriate;Frequency remains appropriate PT Frequency: Min 6X/week Follow Up Recommendations: Home health PT;Supervision/Assistance - 24 hour Equipment Recommended: 3 in 1 bedside comode;Rolling walker with 5" wheels PT Goals  Acute Rehab PT Goals PT Goal Formulation: With patient PT Goal: Rolling Supine to Right Side - Progress: Met PT Goal: Rolling Supine to Left Side - Progress: Met PT Goal: Sit to Supine/Side - Progress: Met PT Goal: Sit to Stand - Progress: Met PT Goal: Stand to Sit - Progress: Met PT Goal: Ambulate - Progress: Progressing toward goal PT Goal: Up/Down Stairs - Progress: Progressing toward goal Additional Goals PT Goal: Additional Goal #1 - Progress: Progressing toward goal  PT Treatment Precautions/Restrictions  Precautions Precautions: Back Precaution Booklet Issued: No Precaution Comments: Pt able to recall and demonstrate 3/3 back precautions Required Braces or Orthoses: Yes Spinal Brace: Lumbar corset;Applied in sitting position Restrictions Weight Bearing Restrictions: No Mobility (including Balance) Bed Mobility Bed Mobility: No Transfers Transfers: Yes Sit to Stand: 6: Modified independent (Device/Increase time);With upper extremity assist;From chair/3-in-1 Stand to Sit: 6: Modified independent (Device/Increase time);To chair/3-in-1 Ambulation/Gait Ambulation/Gait: Yes Ambulation/Gait Assistance: 5: Supervision Ambulation/Gait Assistance Details (indicate cue type and reason): Supervision still required secondary to pain  and grimacing during ambulation. Pt able to complete ambulation in room without RW, although still prefers RW for long distances for pressure relief Ambulation Distance (Feet): 200 Feet Assistive device: Rolling walker Gait Pattern: Step-through pattern;Decreased hip/knee flexion - right;Decreased hip/knee flexion - left;Antalgic Gait velocity: Decreased cadence Stairs: No    Exercise    End of Session PT - End of Session Equipment Utilized During Treatment: Gait belt;Back brace Activity Tolerance: Patient tolerated treatment well Patient left: in chair;with call bell in reach Nurse Communication: Mobility status for transfers;Mobility status for ambulation General Behavior During Session: [facility] for tasks performed Cognition: [facility] for tasks performed  Milana Kidney 11/25/2011, 10:30 AM  11/25/2011 Milana Kidney DPT PAGER: [phone] OFFICE: [phone]

## 2011-11-25 NOTE — Discharge Summary (Signed)
Physician Discharge Summary  Patient ID: REGNIA MATHWIG MRN: [id number] DOB/AGE: [birth year] 46 y.o.  Admit date: 11/21/2011 Discharge date: 11/25/2011  Admission Diagnoses: Lumbago, degenerative disc disease.  Discharge Diagnoses: The same Active Problems:  * No active hospital problems. *    Discharged Condition: good  Hospital Course: I admitted the patient to [facility] 11/21/2011. On that day I performed at L4-5 and L5-S1 posterior instrumentation and fusion. The surgery went well. This is postoperative course was remarkable only for urinary retention. We treated this with Flomax and it resolved. By to 113 the patient was requesting discharge to home. She was given oral and written discharge instructions. All her questions were answered.  Consults: None Significant Diagnostic Studies: None Treatments: L4-5 and L5-S1 posterior lateral arthrodesis, posterior segmental instrumentation. Discharge Exam: Blood pressure 104/69, pulse 79, temperature 98.6 F (37 C), temperature source Oral, resp. rate 20, height 5' 4.17" (1.63 m), weight 71.8 kg (158 lb 4.6 oz), SpO2 95.00%. Patient is alert and oriented. Her lower strumming motor strength is normal.  Disposition: Home  Discharge Orders    Future Orders Please Complete By Expires   Diet - low sodium heart healthy      Increase activity slowly      Discharge instructions      Comments:   Call [phone] for a follow up appointment.   No dressing needed      Call MD for:  temperature >100.4      Call MD for:  persistant nausea and vomiting      Call MD for:  severe uncontrolled pain      Call MD for:  redness, tenderness, or signs of infection (pain, swelling, redness, odor or green/yellow discharge around incision site)      Call MD for:  difficulty breathing, headache or visual disturbances      Call MD for:  hives      Call MD for:  persistant dizziness or light-headedness      Call MD for:  extreme fatigue         Medication List  As of 11/25/2011  8:20 AM   STOP taking these medications         ALPRAZolam 0.5 MG tablet      carisoprodol 350 MG tablet      HYDROcodone-acetaminophen 10-325 MG per tablet         TAKE these medications         diazepam 5 MG tablet   Commonly known as: VALIUM   Take 1 tablet (5 mg total) by mouth every 6 (six) hours as needed.      doxycycline 100 MG capsule   Commonly known as: VIBRAMYCIN   Take 100 mg by mouth 2 (two) times daily.      gabapentin 300 MG capsule   Commonly known as: NEURONTIN   Take 300 mg by mouth 3 (three) times daily. Take 1 capsule in the morning and afternoon and 2 capsule at night      LAXATIVE FORMULA PO   Take 1 tablet by mouth daily as needed. For constipation      omeprazole 10 MG capsule   Commonly known as: PRILOSEC   Take 10 mg by mouth daily.      oxyCODONE-acetaminophen 10-325 MG per tablet   Commonly known as: PERCOCET   Take 1 tablet by mouth every 6 (six) hours as needed.      oxyCODONE-acetaminophen 10-325 MG per tablet   Commonly known as: PERCOCET  Take 1 tablet by mouth every 4 (four) hours as needed for pain.      promethazine 25 MG tablet   Commonly known as: PHENERGAN   Take 1 tablet (25 mg total) by mouth every 6 (six) hours as needed.      Tamsulosin HCl 0.4 MG Caps   Commonly known as: FLOMAX   Take 1 capsule (0.4 mg total) by mouth daily.           Follow-up Information    Follow up with [facility]. [facility] PT)    Contact information:   [phone]         Signed: Cristi Loron 11/25/2011, 8:20 AM

## 2011-11-25 NOTE — Progress Notes (Signed)
Utilization review completed. Sisto Granillo, RN, BSN. 11/25/11  

## 2011-11-25 NOTE — Progress Notes (Signed)
   CARE MANAGEMENT NOTE 11/25/2011  Patient:  Maureen [name], Maureen [name]   Account Number:  [id number]  Date Initiated:  11/22/2011  Documentation initiated by:  [facility]  Subjective/Objective Assessment:   POSTERIOR LUMBAR FUSION 2 LEVEL - Lumbar four-five,Lumbar Five-Sacral One posterolateral arthrodesis with posterior segemental instrumentation     Action/Plan:   has RW at home  Per pt she needs RW and 3:1 commode   Anticipated DC Date:  11/25/2011   Anticipated DC Plan:  HOME W HOME HEALTH SERVICES      DC Planning Services  CM consult      [facility] Choice  HOME HEALTH   Choice offered to / List presented to:  C-1 Patient   DME arranged  WALKER - ROLLING  3-N-1      DME agency  [facility]     [facility] arranged  HH-1 RN      [facility] agency  [facility]   Status of service:  Completed, signed off Medicare Important Message given?   (If response is "NO", the following Medicare IM given date fields will be blank) Date Medicare IM given:   Date Additional Medicare IM given:    Discharge Disposition:  HOME W HOME HEALTH SERVICES  Per UR Regulation:    Comments:  11/25/2011 Met with pt re HH needs, pt insurance will provide HHRN only to monitor pt progression and assist with PT needs. Pt now states that she needs RW and 3:1, both ordered. Johny Shock RN MPH [phone]  11/22/2011 1430 PT is recommending HH PT. Spoke to pt and states she has RW at home. She is requesting 3n1. Provided pt with [facility] agency list for [facility] PT. Message left for MD for order and F2F for [facility] PT. Pt requested [facility] for [facility] services. NCM will set up [facility] and order DME once orders received. Waiting orders. Isidoro Donning RN CCM Case Mgmt phone [phone]

## 2011-11-25 NOTE — Progress Notes (Addendum)
Noted plan to d/c pt and recommendations from PT eval for HHPT, rolling walker and 3:1. However no HH orders written and no face to face completed. Pt RN notified and will contact pt MD of orders, CM will setup as soon as orders received.  Johny Shock RN MPH Case Manager [phone]

## 2011-11-29 MED FILL — Heparin Sodium (Porcine) Inj 1000 Unit/ML: INTRAMUSCULAR | Qty: 30 | Status: AC

## 2011-11-29 MED FILL — Sodium Chloride IV Soln 0.9%: INTRAVENOUS | Qty: 1000 | Status: AC

## 2011-11-29 MED FILL — Sodium Chloride Irrigation Soln 0.9%: Qty: 3000 | Status: AC

## 2012-05-24 ENCOUNTER — Other Ambulatory Visit: Payer: Self-pay | Admitting: Family Medicine

## 2012-05-24 DIAGNOSIS — N6320 Unspecified lump in the left breast, unspecified quadrant: Secondary | ICD-10-CM

## 2012-05-30 ENCOUNTER — Ambulatory Visit
Admission: RE | Admit: 2012-05-30 | Discharge: 2012-05-30 | Disposition: A | Discharge status: Home / Self Care | Payer: Medicaid Other | Source: Ambulatory Visit | Attending: Family Medicine | Admitting: Family Medicine

## 2012-05-30 DIAGNOSIS — N6320 Unspecified lump in the left breast, unspecified quadrant: Secondary | ICD-10-CM

## 2012-07-10 ENCOUNTER — Ambulatory Visit: Payer: Medicaid Other | Attending: Physical Medicine and Rehabilitation | Admitting: Physical Therapy

## 2012-07-10 DIAGNOSIS — IMO0001 Reserved for inherently not codable concepts without codable children: Secondary | ICD-10-CM | POA: Insufficient documentation

## 2012-07-10 DIAGNOSIS — M542 Cervicalgia: Secondary | ICD-10-CM | POA: Insufficient documentation

## 2012-07-10 DIAGNOSIS — R5381 Other malaise: Secondary | ICD-10-CM | POA: Insufficient documentation

## 2012-07-11 ENCOUNTER — Ambulatory Visit: Payer: Medicaid Other | Admitting: Physical Therapy

## 2012-07-17 ENCOUNTER — Ambulatory Visit: Payer: Medicaid Other | Admitting: Physical Therapy

## 2012-07-24 ENCOUNTER — Ambulatory Visit: Payer: Medicaid Other | Attending: Physical Medicine and Rehabilitation | Admitting: Physical Therapy

## 2012-07-24 DIAGNOSIS — IMO0001 Reserved for inherently not codable concepts without codable children: Secondary | ICD-10-CM | POA: Insufficient documentation

## 2012-07-24 DIAGNOSIS — M542 Cervicalgia: Secondary | ICD-10-CM | POA: Insufficient documentation

## 2012-07-24 DIAGNOSIS — R5381 Other malaise: Secondary | ICD-10-CM | POA: Insufficient documentation

## 2012-08-20 ENCOUNTER — Other Ambulatory Visit: Payer: Self-pay | Admitting: Physical Medicine and Rehabilitation

## 2012-08-20 DIAGNOSIS — M47812 Spondylosis without myelopathy or radiculopathy, cervical region: Secondary | ICD-10-CM

## 2012-08-24 ENCOUNTER — Ambulatory Visit
Admission: RE | Admit: 2012-08-24 | Discharge: 2012-08-24 | Disposition: A | Discharge status: Home / Self Care | Payer: Medicaid Other | Source: Ambulatory Visit | Attending: Physical Medicine and Rehabilitation | Admitting: Physical Medicine and Rehabilitation

## 2012-08-24 VITALS — BP 117/77 | HR 71

## 2012-08-24 DIAGNOSIS — M47812 Spondylosis without myelopathy or radiculopathy, cervical region: Secondary | ICD-10-CM

## 2012-08-24 DIAGNOSIS — G970 Cerebrospinal fluid leak from spinal puncture: Secondary | ICD-10-CM | POA: Insufficient documentation

## 2012-08-24 HISTORY — DX: Cerebrospinal fluid leak from spinal puncture: G97.0

## 2012-08-24 MED ORDER — IOHEXOL 300 MG/ML  SOLN
1.0000 mL | Freq: Once | INTRAMUSCULAR | Status: AC | PRN
  Administered 2012-08-24: 1 mL via EPIDURAL

## 2012-08-24 MED ORDER — TRIAMCINOLONE ACETONIDE 40 MG/ML IJ SUSP (RADIOLOGY)
60.0000 mg | Freq: Once | INTRAMUSCULAR | Status: AC
  Administered 2012-08-24: 60 mg via EPIDURAL

## 2012-09-05 ENCOUNTER — Other Ambulatory Visit: Payer: Self-pay | Admitting: Physical Medicine and Rehabilitation

## 2012-09-05 DIAGNOSIS — M47812 Spondylosis without myelopathy or radiculopathy, cervical region: Secondary | ICD-10-CM

## 2012-09-14 ENCOUNTER — Ambulatory Visit
Admission: RE | Admit: 2012-09-14 | Discharge: 2012-09-14 | Disposition: A | Discharge status: Home / Self Care | Payer: Medicaid Other | Source: Ambulatory Visit | Attending: Physical Medicine and Rehabilitation | Admitting: Physical Medicine and Rehabilitation

## 2012-09-14 VITALS — BP 122/71 | HR 68

## 2012-09-14 DIAGNOSIS — M5020 Other cervical disc displacement, unspecified cervical region: Secondary | ICD-10-CM

## 2012-09-14 DIAGNOSIS — M47812 Spondylosis without myelopathy or radiculopathy, cervical region: Secondary | ICD-10-CM

## 2012-09-14 MED ORDER — TRIAMCINOLONE ACETONIDE 40 MG/ML IJ SUSP (RADIOLOGY)
60.0000 mg | Freq: Once | INTRAMUSCULAR | Status: AC
  Administered 2012-09-14: 60 mg via EPIDURAL

## 2012-09-14 MED ORDER — IOHEXOL 300 MG/ML  SOLN
1.0000 mL | Freq: Once | INTRAMUSCULAR | Status: AC | PRN
  Administered 2012-09-14: 1 mL via EPIDURAL

## 2012-09-25 ENCOUNTER — Other Ambulatory Visit: Payer: Self-pay | Admitting: Orthopedic Surgery

## 2012-09-25 DIAGNOSIS — M25519 Pain in unspecified shoulder: Secondary | ICD-10-CM

## 2012-10-04 ENCOUNTER — Ambulatory Visit
Admission: RE | Admit: 2012-10-04 | Discharge: 2012-10-04 | Disposition: A | Discharge status: Home / Self Care | Payer: Medicaid Other | Source: Ambulatory Visit | Attending: Orthopedic Surgery | Admitting: Orthopedic Surgery

## 2012-10-04 DIAGNOSIS — M25519 Pain in unspecified shoulder: Secondary | ICD-10-CM

## 2012-10-04 MED ORDER — METHYLPREDNISOLONE ACETATE 40 MG/ML INJ SUSP (RADIOLOG
120.0000 mg | Freq: Once | INTRAMUSCULAR | Status: AC
  Administered 2012-10-04: 120 mg via INTRA_ARTICULAR

## 2012-10-04 MED ORDER — IOHEXOL 180 MG/ML  SOLN
1.0000 mL | Freq: Once | INTRAMUSCULAR | Status: AC | PRN
  Administered 2012-10-04: 1 mL via INTRA_ARTICULAR

## 2012-10-25 ENCOUNTER — Ambulatory Visit: Payer: Medicaid Other | Attending: Orthopedic Surgery | Admitting: Physical Therapy

## 2012-10-25 DIAGNOSIS — R5381 Other malaise: Secondary | ICD-10-CM | POA: Insufficient documentation

## 2012-10-25 DIAGNOSIS — IMO0001 Reserved for inherently not codable concepts without codable children: Secondary | ICD-10-CM | POA: Insufficient documentation

## 2012-10-25 DIAGNOSIS — M25519 Pain in unspecified shoulder: Secondary | ICD-10-CM | POA: Insufficient documentation

## 2012-10-25 DIAGNOSIS — M6281 Muscle weakness (generalized): Secondary | ICD-10-CM | POA: Insufficient documentation

## 2012-10-31 ENCOUNTER — Ambulatory Visit: Payer: Medicaid Other | Admitting: Physical Therapy

## 2012-11-07 ENCOUNTER — Ambulatory Visit: Payer: Medicaid Other | Admitting: Physical Therapy

## 2012-11-16 ENCOUNTER — Encounter: Payer: Medicaid Other | Admitting: Physical Therapy

## 2012-11-19 ENCOUNTER — Ambulatory Visit: Payer: Medicaid Other | Admitting: Physical Therapy

## 2012-12-05 DIAGNOSIS — I7300 Raynaud's syndrome without gangrene: Secondary | ICD-10-CM | POA: Insufficient documentation

## 2012-12-05 DIAGNOSIS — G8929 Other chronic pain: Secondary | ICD-10-CM | POA: Insufficient documentation

## 2012-12-05 DIAGNOSIS — M359 Systemic involvement of connective tissue, unspecified: Secondary | ICD-10-CM | POA: Insufficient documentation

## 2012-12-05 DIAGNOSIS — M329 Systemic lupus erythematosus, unspecified: Secondary | ICD-10-CM | POA: Insufficient documentation

## 2012-12-05 DIAGNOSIS — M549 Dorsalgia, unspecified: Secondary | ICD-10-CM | POA: Insufficient documentation

## 2013-02-05 ENCOUNTER — Ambulatory Visit: Payer: Medicaid Other | Attending: Orthopedic Surgery | Admitting: Physical Therapy

## 2013-02-05 DIAGNOSIS — IMO0001 Reserved for inherently not codable concepts without codable children: Secondary | ICD-10-CM | POA: Insufficient documentation

## 2013-02-05 DIAGNOSIS — M6281 Muscle weakness (generalized): Secondary | ICD-10-CM | POA: Insufficient documentation

## 2013-02-05 DIAGNOSIS — R5381 Other malaise: Secondary | ICD-10-CM | POA: Insufficient documentation

## 2013-02-05 DIAGNOSIS — M25519 Pain in unspecified shoulder: Secondary | ICD-10-CM | POA: Insufficient documentation

## 2013-02-07 ENCOUNTER — Ambulatory Visit: Payer: Medicaid Other | Admitting: Physical Therapy

## 2013-02-11 ENCOUNTER — Telehealth: Payer: Self-pay

## 2013-02-11 DIAGNOSIS — L989 Disorder of the skin and subcutaneous tissue, unspecified: Secondary | ICD-10-CM

## 2013-02-11 NOTE — Telephone Encounter (Signed)
Patient would like a referral for dermatology for patches on her face from Lupus.  Wants to go to Dr. Gerrit Halls at [facility].  Her mom goes there and if we say that they will see her.  Phone number is [phone]

## 2013-02-11 NOTE — Telephone Encounter (Signed)
Left message, MMM has referral in progress.  Call our referral dept. For questions.

## 2013-02-11 NOTE — Telephone Encounter (Signed)
Referral made 

## 2013-02-12 ENCOUNTER — Ambulatory Visit: Payer: Medicaid Other | Admitting: Physical Therapy

## 2013-02-19 ENCOUNTER — Ambulatory Visit: Payer: Medicaid Other | Admitting: Physical Therapy

## 2013-02-27 ENCOUNTER — Encounter (HOSPITAL_BASED_OUTPATIENT_CLINIC_OR_DEPARTMENT_OTHER): Payer: Self-pay | Admitting: *Deleted

## 2013-02-27 ENCOUNTER — Emergency Department (HOSPITAL_BASED_OUTPATIENT_CLINIC_OR_DEPARTMENT_OTHER)
Admission: EM | Admit: 2013-02-27 | Discharge: 2013-02-27 | Disposition: A | Discharge status: Home / Self Care | Payer: Medicaid Other | Attending: Emergency Medicine | Admitting: Emergency Medicine

## 2013-02-27 DIAGNOSIS — F411 Generalized anxiety disorder: Secondary | ICD-10-CM | POA: Insufficient documentation

## 2013-02-27 DIAGNOSIS — Z880 Allergy status to penicillin: Secondary | ICD-10-CM | POA: Insufficient documentation

## 2013-02-27 DIAGNOSIS — M542 Cervicalgia: Secondary | ICD-10-CM | POA: Insufficient documentation

## 2013-02-27 DIAGNOSIS — Z8669 Personal history of other diseases of the nervous system and sense organs: Secondary | ICD-10-CM | POA: Insufficient documentation

## 2013-02-27 DIAGNOSIS — K219 Gastro-esophageal reflux disease without esophagitis: Secondary | ICD-10-CM | POA: Insufficient documentation

## 2013-02-27 DIAGNOSIS — Z79899 Other long term (current) drug therapy: Secondary | ICD-10-CM | POA: Insufficient documentation

## 2013-02-27 DIAGNOSIS — F3289 Other specified depressive episodes: Secondary | ICD-10-CM | POA: Insufficient documentation

## 2013-02-27 DIAGNOSIS — Z87891 Personal history of nicotine dependence: Secondary | ICD-10-CM | POA: Insufficient documentation

## 2013-02-27 DIAGNOSIS — Z8719 Personal history of other diseases of the digestive system: Secondary | ICD-10-CM | POA: Insufficient documentation

## 2013-02-27 DIAGNOSIS — G8929 Other chronic pain: Secondary | ICD-10-CM | POA: Insufficient documentation

## 2013-02-27 DIAGNOSIS — M25531 Pain in right wrist: Secondary | ICD-10-CM

## 2013-02-27 DIAGNOSIS — Z8589 Personal history of malignant neoplasm of other organs and systems: Secondary | ICD-10-CM | POA: Insufficient documentation

## 2013-02-27 DIAGNOSIS — Z8679 Personal history of other diseases of the circulatory system: Secondary | ICD-10-CM | POA: Insufficient documentation

## 2013-02-27 DIAGNOSIS — Z9889 Other specified postprocedural states: Secondary | ICD-10-CM | POA: Insufficient documentation

## 2013-02-27 DIAGNOSIS — F329 Major depressive disorder, single episode, unspecified: Secondary | ICD-10-CM | POA: Insufficient documentation

## 2013-02-27 DIAGNOSIS — M25539 Pain in unspecified wrist: Secondary | ICD-10-CM | POA: Insufficient documentation

## 2013-02-27 DIAGNOSIS — M129 Arthropathy, unspecified: Secondary | ICD-10-CM | POA: Insufficient documentation

## 2013-02-27 MED ORDER — PREDNISONE 10 MG PO TABS: 20.0000 mg | ORAL_TABLET | Freq: Two times a day (BID) | ORAL | Status: DC

## 2013-02-27 NOTE — ED Notes (Signed)
Pt c/o left wrist and hand pain x 1 'month

## 2013-02-27 NOTE — ED Provider Notes (Signed)
History     CSN: [id number]  Arrival date & time 02/27/13  1449   First MD Initiated Contact with Patient 02/27/13 1502      Chief Complaint  Patient presents with  . Hand Pain    (Consider location/radiation/quality/duration/timing/severity/associated sxs/prior treatment) HPI Comments: Patient presents with complaints of pain in both wrists.  There has been no injury or trauma.  She denies numbness or tingling.  She is believed by her rheumatologist to have Lupus, however the ANA is negative.  She is having chronic pain from neck and recent shoulder surgeries.  Was recently on prednisone.    Patient is a 47 y.o. female presenting with hand pain. The history is provided by the patient.  Hand Pain This is a new problem. Episode onset: one month ago. The problem occurs constantly. The problem has been gradually worsening. Exacerbated by: movement, palpation. Nothing relieves the symptoms. She has tried nothing for the symptoms. The treatment provided no relief.    Past Medical History  Diagnosis Date  . Irritable bowel syndrome (IBS)   . Abdominal pain   . Fusion of spine Jan 2011    L4-S1 fusion  . Depression   . Anxiety   . Cancer     MOLE PRE  . GERD (gastroesophageal reflux disease)   . Headache     SINUS  . Arthritis   . Cerebrospinal fluid leak from spinal puncture 08/24/2012    Past Surgical History  Procedure Laterality Date  . Spinal fusion  jan 2011    L4-S1 fusion  . Abdominal hernia repair    . Ablation of spine    . Cholecystectomy    . Uterine ablation    . Abdominal hysterectomy  8/12  . Tonsillectomy    . Fracture surgery    . Cesarean section      History reviewed. No pertinent family history.  History  Substance Use Topics  . Smoking status: Former Smoker -- 0.50 packs/day    Quit date: 11/13/2008  . Smokeless tobacco: Not on file  . Alcohol Use: 0.6 oz/week    1 Cans of beer per week    OB History   Grav Para Term Preterm Abortions TAB  SAB Ect Mult Living                  Review of Systems  All other systems reviewed and are negative.    Allergies  Penicillins  Home Medications   Current Outpatient Rx  Name  Route  Sig  Dispense  Refill  . doxycycline (VIBRAMYCIN) 100 MG capsule   Oral   Take 100 mg by mouth 2 (two) times daily.         Marland Kitchen gabapentin (NEURONTIN) 300 MG capsule   Oral   Take 300 mg by mouth 3 (three) times daily. Take 1 capsule in the morning and afternoon and 2 capsule at night         . Misc Natural Products (LAXATIVE FORMULA PO)   Oral   Take 1 tablet by mouth daily as needed. For constipation         . omeprazole (PRILOSEC) 10 MG capsule   Oral   Take 10 mg by mouth daily.           Marland Kitchen oxyCODONE-acetaminophen (PERCOCET) 10-325 MG per tablet   Oral   Take 1 tablet by mouth every 6 (six) hours as needed.         . Tamsulosin HCl (FLOMAX) 0.4  MG CAPS   Oral   Take 1 capsule (0.4 mg total) by mouth daily.   30 capsule   0     BP 125/80  Pulse 84  Temp(Src) 99 F (37.2 C) (Oral)  Resp 16  Ht 5\' 4"  (1.626 m)  Wt 137 lb (62.143 kg)  BMI 23.5 kg/m2  SpO2 100%  Physical Exam  Nursing note and vitals reviewed. Constitutional: She is oriented to person, place, and time. She appears well-developed and well-nourished.  HENT:  Head: Normocephalic and atraumatic.  Mouth/Throat: Oropharynx is clear and moist.  Neck: Normal range of motion. Neck supple.  Musculoskeletal:  Both wrists appear grossly normal.  There is no redness, warmth, or swelling.  There seems to be full range of motion.  Good cap refill and there are no deficits in sensation or motor.  Neurological: She is alert and oriented to person, place, and time.  Skin: Skin is warm and dry.    ED Course  Procedures (including critical care time)  Labs Reviewed - No data to display No results found.   No diagnosis found.    MDM  The patient is requesting repeat blood work, however I believe it is  appropriate to defer this to her rheumatologist as the first ana was already negative.  I have agreed to prescribe her prednisone and she is to call her rheumatologist in the morning to expedite follow up.  There appears to be no emergent process.  Highly doubt bilateral septic arthritis, and distal pulses are easily palpable excluding a vascular etiology.           Geoffery Lyons, MD 02/27/13 1525

## 2013-03-05 ENCOUNTER — Ambulatory Visit: Payer: Medicaid Other | Attending: Orthopedic Surgery | Admitting: Physical Therapy

## 2013-03-05 DIAGNOSIS — R5381 Other malaise: Secondary | ICD-10-CM | POA: Insufficient documentation

## 2013-03-05 DIAGNOSIS — M25519 Pain in unspecified shoulder: Secondary | ICD-10-CM | POA: Insufficient documentation

## 2013-03-05 DIAGNOSIS — IMO0001 Reserved for inherently not codable concepts without codable children: Secondary | ICD-10-CM | POA: Insufficient documentation

## 2013-03-05 DIAGNOSIS — M6281 Muscle weakness (generalized): Secondary | ICD-10-CM | POA: Insufficient documentation

## 2013-03-11 ENCOUNTER — Ambulatory Visit: Payer: Medicaid Other | Admitting: Physical Therapy

## 2013-03-13 ENCOUNTER — Encounter: Payer: Self-pay | Admitting: General Practice

## 2013-03-13 ENCOUNTER — Telehealth: Payer: Self-pay | Admitting: Nurse Practitioner

## 2013-03-13 ENCOUNTER — Ambulatory Visit (INDEPENDENT_AMBULATORY_CARE_PROVIDER_SITE_OTHER): Payer: Medicaid Other | Admitting: General Practice

## 2013-03-13 VITALS — BP 106/74 | HR 75 | Temp 97.6°F | Ht 64.0 in | Wt 142.0 lb

## 2013-03-13 DIAGNOSIS — R140 Abdominal distension (gaseous): Secondary | ICD-10-CM

## 2013-03-13 DIAGNOSIS — R197 Diarrhea, unspecified: Secondary | ICD-10-CM

## 2013-03-13 DIAGNOSIS — R5381 Other malaise: Secondary | ICD-10-CM

## 2013-03-13 DIAGNOSIS — R141 Gas pain: Secondary | ICD-10-CM

## 2013-03-13 DIAGNOSIS — R21 Rash and other nonspecific skin eruption: Secondary | ICD-10-CM

## 2013-03-13 DIAGNOSIS — R5383 Other fatigue: Secondary | ICD-10-CM

## 2013-03-13 NOTE — Patient Instructions (Addendum)
Bloating  Bloating is the feeling of fullness in your belly. You may feel as though your pants are too tight. Often the cause of bloating is overeating, retaining fluids, or having gas in your bowel. It is also caused by swallowing air and eating foods that cause gas. Irritable bowel syndrome is one of the most common causes of bloating. Constipation is also a common cause. Sometimes more serious problems can cause bloating.  SYMPTOMS   Usually there is a feeling of fullness, as though your abdomen is bulged out. There may be mild discomfort.   DIAGNOSIS   Usually no particular testing is necessary for most bloating. If the condition persists and seems to become worse, your caregiver may do additional testing.   TREATMENT   · There is no direct treatment for bloating.  · Do not put gas into the bowel. Avoid chewing gum and sucking on candy. These tend to make you swallow air. Swallowing air can also be a nervous habit. Try to avoid this.  · Avoiding high residue diets will help. Eat foods with soluble fibers (examples include root vegetables, apples, or barley) and substitute dairy products with soy and rice products. This helps irritable bowel syndrome.  · If constipation is the cause, then a high residue diet with more fiber will help.  · Avoid carbonated beverages.  · Over-the-counter preparations are available that help reduce gas. Your pharmacist can help you with this.  SEEK MEDICAL CARE IF:   · Bloating continues and seems to be getting worse.  · You notice a weight gain.  · You have a weight loss but the bloating is getting worse.  · You have changes in your bowel habits or develop nausea or vomiting.  SEEK IMMEDIATE MEDICAL CARE IF:   · You develop shortness of breath or swelling in your legs.  · You have an increase in abdominal pain or develop chest pain.  Document Released: 08/10/2006 Document Revised: 01/02/2012 Document Reviewed: 09/28/2007  Exit[employer]® Patient Information ©2014 [employer].

## 2013-03-13 NOTE — Telephone Encounter (Signed)
APPT GIVEN

## 2013-03-13 NOTE — Progress Notes (Signed)
  Subjective:    Patient ID: Maureen [name], female    DOB: 15-Sep-1966, 47 y.o.   MRN: [id number]  HPI Presents today with recurrent/intermittent hives, rashes, and facial flushing. Reports seeing a rheumatologist since February. She reports being told that her skin issues are related to lupus. Also had shoulder surgery eight weeks ago. Patient is concerned about having celiac disease. Reports taken medications as prescribed. Reports her rashes and hives usually go away sooner than current rash on right upper arm. Denies burning with current rash, but itching. Denies contact with plants, change in detergent, body washes, or lotions. Denies any drainage. Reports applying cortisone cream, taking allegra during the day, and benadryl at night. Reports seeing a dermatologist yearly and was seen recently (February) for facial rash. Reports taking prednisone in the past for skin issues and very effective for skin, but affected bipolar.     Review of Systems  Constitutional: Negative for fever and chills.  Respiratory: Negative for cough, chest tightness and wheezing.   Cardiovascular: Negative for chest pain and palpitations.  Gastrointestinal: Positive for abdominal pain, diarrhea and constipation. Negative for blood in stool.  Genitourinary: Negative for difficulty urinating.  Skin:       Recurrent rashes  Neurological: Positive for headaches. Negative for dizziness.  Psychiatric/Behavioral: Negative for suicidal ideas and self-injury.       Objective:   Physical Exam  Constitutional: She is oriented to person, place, and time. She appears well-developed and well-nourished.  HENT:  Head: Normocephalic and atraumatic.  Right Ear: External ear normal.  Left Ear: External ear normal.  Mouth/Throat: Oropharynx is clear and moist.  Cardiovascular: Normal rate, regular rhythm and normal heart sounds.   Pulmonary/Chest: Effort normal and breath sounds normal. No respiratory distress. She exhibits no  tenderness.  Neurological: She is alert and oriented to person, place, and time.  Skin: Skin is warm and dry.  Psychiatric: She has a normal mood and affect.          Assessment & Plan:  1. Bloating 2. Other malaise and fatigue 3. Diarrhea 4. Rash and nonspecific skin eruption - Ambulatory referral to Gastroenterology  RTO if symptoms worsen Patient verbalized understanding Coralie Keens, FNP-C

## 2013-03-17 ENCOUNTER — Other Ambulatory Visit: Payer: Self-pay | Admitting: Nurse Practitioner

## 2013-03-19 ENCOUNTER — Encounter (INDEPENDENT_AMBULATORY_CARE_PROVIDER_SITE_OTHER): Payer: Self-pay | Admitting: *Deleted

## 2013-03-19 ENCOUNTER — Ambulatory Visit: Payer: Medicaid Other | Admitting: Physical Therapy

## 2013-03-26 ENCOUNTER — Encounter (INDEPENDENT_AMBULATORY_CARE_PROVIDER_SITE_OTHER): Payer: Self-pay | Admitting: Internal Medicine

## 2013-03-26 ENCOUNTER — Other Ambulatory Visit (INDEPENDENT_AMBULATORY_CARE_PROVIDER_SITE_OTHER): Payer: Self-pay | Admitting: Internal Medicine

## 2013-03-26 ENCOUNTER — Ambulatory Visit (INDEPENDENT_AMBULATORY_CARE_PROVIDER_SITE_OTHER): Payer: Medicaid Other | Admitting: Internal Medicine

## 2013-03-26 VITALS — BP 104/72 | HR 72 | Ht 64.0 in | Wt 142.6 lb

## 2013-03-26 DIAGNOSIS — G8929 Other chronic pain: Secondary | ICD-10-CM

## 2013-03-26 DIAGNOSIS — R1013 Epigastric pain: Secondary | ICD-10-CM | POA: Insufficient documentation

## 2013-03-26 LAB — CBC WITH DIFFERENTIAL/PLATELET
Basophils Absolute: 0 10*3/uL (ref 0.0–0.1)
Eosinophils Absolute: 0 10*3/uL (ref 0.0–0.7)
Eosinophils Relative: 1 % (ref 0–5)
Lymphocytes Relative: 44 % (ref 12–46)
MCH: 31 pg (ref 26.0–34.0)
MCV: 90 fL (ref 78.0–100.0)
Neutrophils Relative %: 47 % (ref 43–77)
Platelets: 273 10*3/uL (ref 150–400)
RDW: 13.6 % (ref 11.5–15.5)
WBC: 3.2 10*3/uL — ABNORMAL LOW (ref 4.0–10.5)

## 2013-03-26 LAB — COMPREHENSIVE METABOLIC PANEL
ALT: 12 U/L (ref 0–35)
AST: 14 U/L (ref 0–37)
Creat: 0.66 mg/dL (ref 0.50–1.10)
Sodium: 139 mEq/L (ref 135–145)
Total Bilirubin: 0.3 mg/dL (ref 0.3–1.2)

## 2013-03-26 NOTE — Patient Instructions (Addendum)
Celiac panel

## 2013-03-26 NOTE — Progress Notes (Addendum)
Subjective:     Patient ID: Maureen [name], female   DOB: 01-10-1966, 47 y.o.   MRN: [id number]  HPI Referred to our office for constipation. Mae Byron FNP. She tells me she has issues with her stomach. Symptoms for about a year. Frequent nausea. She will have nausea off and on all day. Cholecystectomy 2011 for low EF. She has bloating. She tells me her intestines ache. She wants to be tested for Celiac. She breaks out in hives which last over a week.  She has rash also on her knees. She has Lupus symptoms. She cannot go out in the sun without breaking out.  She has epigastric tenderness. She has a hx of cholecystectomy. She has been seen by a dermatologist and she had a biopsy on her rt arm and came back inconclusive. Hx of inflammatory arthritis.  She wants to rule out celiac disease.  Appetite is good. She has dropped 2 sizes in the past 2 yrs. She has bloating.  Occasionally acid reflux but controlled with Prilosec. When she eats too much she has sharp pains in her epigastric region. She can generally eat anything she wants. Breads do not bother her.   She went on a gluten free diet x 1 week and there was really no change.  She has a hx of constipation. Fiber helps with her constipation. She does have diarrhea occasioally She usually has a BM daily but she feels like they are not complete. Presently taking fiber for her constipation.,  09/11/2008 EUS Dr. Christella Hartigan: Assessment:  elevated white plaque seen on the lateral duodenal mesenteric  junction during recent lap chole for biliary dyskesia.  Endoscopic findings:  1. Normal esophagus.  2. Normal stomach.  3. Normal duodenum.  EUS findings:  1. Normal duodenal wall echolayering. No periduodenal adenopathy,  lesions.  2. Normal pancreatic parenchyma and main pancreatic duct.  3. Normal, non-dilated CBD.  4. Surgically absent gallbladder.  5. Limited views of liver, spleen, splenic and portal vessels were  all  normal.    Review of Systems See hpi     Current Outpatient Prescriptions  Medication Sig Dispense Refill  . ALPRAZolam (XANAX) 0.5 MG tablet Take 0.5 mg by mouth 3 (three) times daily as needed for sleep or anxiety. Per Dr. Hurley Cisco      . BIOTIN 5000 PO Take by mouth once.      . cyanocobalamin 100 MCG tablet Take 100 mcg by mouth daily.      . felodipine (PLENDIL) 5 MG 24 hr tablet Take 5 mg by mouth daily.      Marland Kitchen gabapentin (NEURONTIN) 300 MG capsule       . HYDROcodone-acetaminophen (NORCO) 10-325 MG per tablet Take 1 tablet by mouth every 6 (six) hours as needed for pain. Per Dr. Lovell Sheehan      . hydroxychloroquine (PLAQUENIL) 200 MG tablet Take by mouth 2 (two) times daily.      Marland Kitchen lamoTRIgine (LAMICTAL) 25 MG tablet Take 75 mg by mouth daily. Take three pills HS      . Misc Natural Products (LAXATIVE FORMULA PO) Take 1 tablet by mouth daily as needed. For constipation      . omeprazole (PRILOSEC) 10 MG capsule Take 40 mg by mouth daily.       Marland Kitchen oxyCODONE-acetaminophen (PERCOCET) 10-325 MG per tablet Take 1 tablet by mouth every 6 (six) hours as needed. Dr. Lovell Sheehan      . tamsulosin (FLOMAX) 0.4 MG CAPS Take 0.4 mg by mouth  as needed.      . predniSONE (DELTASONE) 10 MG tablet Take 2 tablets (20 mg total) by mouth 2 (two) times daily.  20 tablet  0   No current facility-administered medications for this visit.   Past Medical History  Diagnosis Date  . Irritable bowel syndrome (IBS)   . Abdominal pain   . Fusion of spine Jan 2011    L4-S1 fusion  . Depression   . Anxiety   . Cancer     MOLE PRE  . GERD (gastroesophageal reflux disease)   . Headache(784.0)     SINUS  . Arthritis   . Cerebrospinal fluid leak from spinal puncture 08/24/2012   Past Surgical History  Procedure Laterality Date  . Spinal fusion  jan 2011    L4-S1 fusion  . Abdominal hernia repair    . Ablation of spine    . Cholecystectomy    . Uterine ablation    . Abdominal hysterectomy  8/12  .  Tonsillectomy    . Fracture surgery    . Cesarean section    . Shoulder surgery Right    Allergies  Allergen Reactions  . Penicillins Rash    Objective:   Physical Exam  Filed Vitals:   03/26/13 1112  BP: 104/72  Pulse: 72  Height: 5\' 4"  (1.626 m)  Weight: 142 lb 9.6 oz (64.683 kg)   Alert and oriented. Skin warm and dry. Oral mucosa is moist.   . Sclera anicteric, conjunctivae is pink. Thyroid not enlarged. No cervical lymphadenopathy. Lungs clear. Heart regular rate and rhythm.  Abdomen is soft. Bowel sounds are positive. No hepatomegaly. No abdominal masses felt. Epigastric tenderness.    No edema to lower extremities.       Assessment:    Epigastric pain. Will test for Celiac disease. Really no acid reflux.    Plan:    If normal will discuss with Dr. Karilyn Cota.  CBC, Cmet, Celiac panel.

## 2013-03-27 LAB — TISSUE TRANSGLUTAMINASE, IGA: Tissue Transglutaminase Ab, IgA: 1.9 U/mL (ref <20)

## 2013-03-27 LAB — GLIADIN ANTIBODIES, SERUM: Gliadin IgA: 1.9 U/mL (ref <20)

## 2013-03-28 ENCOUNTER — Encounter: Payer: Medicaid Other | Admitting: Physical Therapy

## 2013-03-28 ENCOUNTER — Encounter (INDEPENDENT_AMBULATORY_CARE_PROVIDER_SITE_OTHER): Payer: Self-pay | Admitting: Internal Medicine

## 2013-03-29 LAB — RETICULIN ANTIBODIES, IGA W TITER: Reticulin Ab, IgA: NEGATIVE

## 2013-04-08 ENCOUNTER — Encounter: Payer: Self-pay | Admitting: Nurse Practitioner

## 2013-04-08 ENCOUNTER — Encounter: Payer: Self-pay | Admitting: Family Medicine

## 2013-04-13 ENCOUNTER — Other Ambulatory Visit: Payer: Self-pay | Admitting: Nurse Practitioner

## 2013-04-16 ENCOUNTER — Other Ambulatory Visit (INDEPENDENT_AMBULATORY_CARE_PROVIDER_SITE_OTHER): Payer: Self-pay | Admitting: *Deleted

## 2013-04-16 ENCOUNTER — Ambulatory Visit (INDEPENDENT_AMBULATORY_CARE_PROVIDER_SITE_OTHER): Payer: Medicaid Other | Admitting: Internal Medicine

## 2013-04-16 ENCOUNTER — Encounter (INDEPENDENT_AMBULATORY_CARE_PROVIDER_SITE_OTHER): Payer: Self-pay | Admitting: *Deleted

## 2013-04-16 ENCOUNTER — Encounter (INDEPENDENT_AMBULATORY_CARE_PROVIDER_SITE_OTHER): Payer: Self-pay | Admitting: Internal Medicine

## 2013-04-16 VITALS — BP 90/52 | HR 84 | Temp 98.3°F | Ht 64.0 in | Wt 143.0 lb

## 2013-04-16 DIAGNOSIS — M129 Arthropathy, unspecified: Secondary | ICD-10-CM

## 2013-04-16 DIAGNOSIS — G8929 Other chronic pain: Secondary | ICD-10-CM

## 2013-04-16 DIAGNOSIS — R1013 Epigastric pain: Secondary | ICD-10-CM

## 2013-04-16 DIAGNOSIS — M1990 Unspecified osteoarthritis, unspecified site: Secondary | ICD-10-CM

## 2013-04-16 NOTE — Progress Notes (Signed)
Subjective:     Patient ID: Maureen [name], female   DOB: 1966-04-10, 47 y.o.   MRN: [id number]  HPI  She present today with c/o epigastric pain and slight to the rt.upper abdomen. She tells me she has nausea "all the time" Sometimes she tells me she has swelling. Symptoms for about a year. She has frequent nausea. She has lost 2 sizes which was unintentional.  She has lost 1 size this year. She was concerned about Celiac disease.  Recent Celiac panel was negative by me She also has hx hives. She has seen a Rheumatologist for this and was taken off Plaquenil.  She has not tested positive for Lupus but they are treating her for Lupus. She tells me she continues to have facial rash. She has a hx of Raynaud's. Hx of inflammatory arthritis.  If she overeats she will have epigastric pain. She tells me it hurts to touch her epigastric region. She has bloating and acid reflux.  For the most part her acid reflux is controlled with Omeprazole. She has a BM every 2-3 days. She does include fiber in her diet.   Recent Celiac panel was negative She has tried a gluten free diet and no changes in her symptoms.   09/11/2008 EUS Dr. Christella Hartigan: Assessment:  elevated white plaque seen on the lateral duodenal mesenteric  junction during recent lap chole for biliary dyskesia.  Endoscopic findings:  1. Normal esophagus.  2. Normal stomach.  3. Normal duodenum.  EUS findings:  1. Normal duodenal wall echolayering. No periduodenal adenopathy,  lesions.  2. Normal pancreatic parenchyma and main pancreatic duct.  3. Normal, non-dilated CBD.  4. Surgically absent gallbladder.  5. Limited views of liver, spleen, splenic and portal vessels were  all normal.  08/25/2008 Cholecystectomy for biliary dyskinesia. Dr. Luretha Murphy   Review of Systems see hpi Current Outpatient Prescriptions  Medication Sig Dispense Refill  . ALPRAZolam (XANAX) 0.5 MG tablet Take 0.5 mg by mouth 3 (three) times daily as needed for  sleep or anxiety. Per Dr. Hurley Cisco      . BIOTIN 5000 PO Take by mouth once.      . cyanocobalamin 100 MCG tablet Take 100 mcg by mouth daily.      . felodipine (PLENDIL) 5 MG 24 hr tablet Take 5 mg by mouth daily.      Marland Kitchen HYDROcodone-acetaminophen (NORCO) 10-325 MG per tablet Take 1 tablet by mouth every 6 (six) hours as needed for pain. Per Dr. Lovell Sheehan      . hydroxychloroquine (PLAQUENIL) 200 MG tablet Take by mouth 2 (two) times daily.      . Misc Natural Products (LAXATIVE FORMULA PO) Take 1 tablet by mouth daily as needed. For constipation      . mupirocin ointment (BACTROBAN) 2 % APPLY TO AFFECTED AREAS TWICE DAILY  22 g  0  . omeprazole (PRILOSEC) 10 MG capsule Take 40 mg by mouth daily.       Marland Kitchen oxyCODONE-acetaminophen (PERCOCET) 10-325 MG per tablet Take 1 tablet by mouth every 6 (six) hours as needed. Dr. Lovell Sheehan      . tamsulosin (FLOMAX) 0.4 MG CAPS Take 0.4 mg by mouth as needed.      . gabapentin (NEURONTIN) 300 MG capsule 3 (three) times daily.        No current facility-administered medications for this visit.   Past Medical History  Diagnosis Date  . Irritable bowel syndrome (IBS)   . Abdominal pain   . Fusion  of spine Jan 2011    L4-S1 fusion  . Depression   . Anxiety   . Cancer     MOLE PRE  . GERD (gastroesophageal reflux disease)   . Headache(784.0)     SINUS  . Arthritis   . Cerebrospinal fluid leak from spinal puncture 08/24/2012  . Raynaud's disease    Past Surgical History  Procedure Laterality Date  . Spinal fusion  jan 2011    L4-S1 fusion  . Abdominal hernia repair    . Ablation of spine    . Cholecystectomy    . Uterine ablation    . Abdominal hysterectomy  8/12  . Tonsillectomy    . Fracture surgery    . Cesarean section    . Shoulder surgery Right    Allergies  Allergen Reactions  . Penicillins Rash      HX:  3 children in good health She is separated Works part time and Art gallery manager Objective:   Physical Exam  Filed Vitals:    04/16/13 1449  BP: 90/52  Pulse: 84  Temp: 98.3 F (36.8 C)  Height: 5\' 4"  (1.626 m)  Weight: 143 lb (64.864 kg)   Alert and oriented. Skin warm and dry. Oral mucosa is moist.   . Sclera anicteric, conjunctivae is pink. Thyroid not enlarged. No cervical lymphadenopathy. Lungs clear. Heart regular rate and rhythm.  Abdomen is soft. Bowel sounds are positive. No hepatomegaly. No abdominal masses felt. Epigastric tenderness to light touch.  No edema to lower extremities.       Assessment:    Epigastric pain. PUD needs to be ruled out.  Doubt this is Celiac disease    Plan:    EGD with Dr. Karilyn Cota to rule out PUD.The risks and benefits such as perforation, bleeding, and infection were reviewed with the patient and is agreeable.

## 2013-04-16 NOTE — Patient Instructions (Addendum)
EGD with Dr. Rehman. The risks and benefits such as perforation, bleeding, and infection were reviewed with the patient and is agreeable. 

## 2013-04-18 ENCOUNTER — Encounter (HOSPITAL_COMMUNITY): Payer: Self-pay | Admitting: Pharmacy Technician

## 2013-04-22 ENCOUNTER — Emergency Department (HOSPITAL_COMMUNITY): Payer: Medicaid Other

## 2013-04-22 ENCOUNTER — Encounter (HOSPITAL_COMMUNITY): Payer: Self-pay | Admitting: *Deleted

## 2013-04-22 ENCOUNTER — Emergency Department (HOSPITAL_COMMUNITY)
Admission: EM | Admit: 2013-04-22 | Discharge: 2013-04-22 | Disposition: A | Discharge status: Home / Self Care | Payer: Medicaid Other | Attending: Emergency Medicine | Admitting: Emergency Medicine

## 2013-04-22 DIAGNOSIS — F411 Generalized anxiety disorder: Secondary | ICD-10-CM | POA: Insufficient documentation

## 2013-04-22 DIAGNOSIS — Z87891 Personal history of nicotine dependence: Secondary | ICD-10-CM | POA: Insufficient documentation

## 2013-04-22 DIAGNOSIS — R109 Unspecified abdominal pain: Secondary | ICD-10-CM

## 2013-04-22 DIAGNOSIS — K219 Gastro-esophageal reflux disease without esophagitis: Secondary | ICD-10-CM | POA: Insufficient documentation

## 2013-04-22 DIAGNOSIS — R519 Headache, unspecified: Secondary | ICD-10-CM

## 2013-04-22 DIAGNOSIS — Z880 Allergy status to penicillin: Secondary | ICD-10-CM | POA: Insufficient documentation

## 2013-04-22 DIAGNOSIS — Z8739 Personal history of other diseases of the musculoskeletal system and connective tissue: Secondary | ICD-10-CM | POA: Insufficient documentation

## 2013-04-22 DIAGNOSIS — IMO0001 Reserved for inherently not codable concepts without codable children: Secondary | ICD-10-CM | POA: Insufficient documentation

## 2013-04-22 DIAGNOSIS — F3289 Other specified depressive episodes: Secondary | ICD-10-CM | POA: Insufficient documentation

## 2013-04-22 DIAGNOSIS — L539 Erythematous condition, unspecified: Secondary | ICD-10-CM | POA: Insufficient documentation

## 2013-04-22 DIAGNOSIS — R51 Headache: Secondary | ICD-10-CM | POA: Insufficient documentation

## 2013-04-22 DIAGNOSIS — IMO0002 Reserved for concepts with insufficient information to code with codable children: Secondary | ICD-10-CM | POA: Insufficient documentation

## 2013-04-22 DIAGNOSIS — F329 Major depressive disorder, single episode, unspecified: Secondary | ICD-10-CM | POA: Insufficient documentation

## 2013-04-22 DIAGNOSIS — Z3202 Encounter for pregnancy test, result negative: Secondary | ICD-10-CM | POA: Insufficient documentation

## 2013-04-22 DIAGNOSIS — Z79899 Other long term (current) drug therapy: Secondary | ICD-10-CM | POA: Insufficient documentation

## 2013-04-22 DIAGNOSIS — M2550 Pain in unspecified joint: Secondary | ICD-10-CM | POA: Insufficient documentation

## 2013-04-22 DIAGNOSIS — R21 Rash and other nonspecific skin eruption: Secondary | ICD-10-CM

## 2013-04-22 DIAGNOSIS — M549 Dorsalgia, unspecified: Secondary | ICD-10-CM | POA: Insufficient documentation

## 2013-04-22 DIAGNOSIS — R110 Nausea: Secondary | ICD-10-CM | POA: Insufficient documentation

## 2013-04-22 DIAGNOSIS — G8929 Other chronic pain: Secondary | ICD-10-CM | POA: Insufficient documentation

## 2013-04-22 LAB — CBC WITH DIFFERENTIAL/PLATELET
Eosinophils Relative: 0 % (ref 0–5)
HCT: 41.5 % (ref 36.0–46.0)
Lymphocytes Relative: 26 % (ref 12–46)
Lymphs Abs: 1.8 10*3/uL (ref 0.7–4.0)
MCV: 89.8 fL (ref 78.0–100.0)
Monocytes Absolute: 0.5 10*3/uL (ref 0.1–1.0)
Monocytes Relative: 7 % (ref 3–12)
RBC: 4.62 MIL/uL (ref 3.87–5.11)
WBC: 7.1 10*3/uL (ref 4.0–10.5)

## 2013-04-22 LAB — POCT PREGNANCY, URINE: Preg Test, Ur: NEGATIVE

## 2013-04-22 LAB — COMPREHENSIVE METABOLIC PANEL
ALT: 13 U/L (ref 0–35)
CO2: 32 mEq/L (ref 19–32)
Calcium: 9.7 mg/dL (ref 8.4–10.5)
Creatinine, Ser: 0.6 mg/dL (ref 0.50–1.10)
GFR calc Af Amer: >90 mL/min (ref >90)
GFR calc non Af Amer: >90 mL/min (ref >90)
Glucose, Bld: 102 mg/dL — ABNORMAL HIGH (ref 70–99)

## 2013-04-22 LAB — URINALYSIS, ROUTINE W REFLEX MICROSCOPIC
Hgb urine dipstick: NEGATIVE
Protein, ur: NEGATIVE mg/dL
Urobilinogen, UA: 0.2 mg/dL (ref 0.0–1.0)

## 2013-04-22 MED ORDER — DEXAMETHASONE SODIUM PHOSPHATE 10 MG/ML IJ SOLN
10.0000 mg | Freq: Once | INTRAMUSCULAR | Status: AC
  Administered 2013-04-22: 10 mg via INTRAVENOUS
  Filled 2013-04-22: qty 1

## 2013-04-22 MED ORDER — METOCLOPRAMIDE HCL 5 MG/ML IJ SOLN
10.0000 mg | Freq: Once | INTRAMUSCULAR | Status: AC
  Administered 2013-04-22: 10 mg via INTRAVENOUS
  Filled 2013-04-22: qty 2

## 2013-04-22 MED ORDER — LORAZEPAM 1 MG PO TABS
1.0000 mg | ORAL_TABLET | Freq: Once | ORAL | Status: AC
  Administered 2013-04-22: 1 mg via ORAL
  Filled 2013-04-22: qty 1

## 2013-04-22 MED ORDER — PREDNISONE 20 MG PO TABS: 40.0000 mg | ORAL_TABLET | Freq: Every day | ORAL | Status: DC

## 2013-04-22 MED ORDER — KETOROLAC TROMETHAMINE 30 MG/ML IJ SOLN
30.0000 mg | Freq: Once | INTRAMUSCULAR | Status: AC
  Administered 2013-04-22: 30 mg via INTRAVENOUS
  Filled 2013-04-22: qty 1

## 2013-04-22 NOTE — ED Provider Notes (Signed)
History    CSN: [id number] Arrival date & time 04/22/13  0908  First MD Initiated Contact with Patient 04/22/13 [phone]     Chief Complaint  Patient presents with  . Abdominal Pain   (Consider location/radiation/quality/duration/timing/severity/associated sxs/prior Treatment) HPI Pt is a 47yo female with hx of chronic intermittent abdominal pain, GERD, anxiety presenting with continued epigastric pain as well as severe frontal headache that started this morning resulting in onset of facial rash.  Pt is very anxious and concerned she is having an allergic reaction to her medications, however denies any difficulty breathing or hives.  States headache is causing eyes to hurt, more intense in the sun and light.  Denies nausea. Pt does report being worked up for Lupus in Feb but so far all her tests by her rheumatologist are nl.  Denies any fever, vomiting, or diarrhea. Denies previous abdominal surgeries but states she is scheduled for endoscopy in a few weeks.   Past Medical History  Diagnosis Date  . Irritable bowel syndrome (IBS)   . Abdominal pain   . Fusion of spine Jan 2011    L4-S1 fusion  . Depression   . Anxiety   . Cancer     MOLE PRE  . GERD (gastroesophageal reflux disease)   . Headache(784.0)     SINUS  . Arthritis   . Cerebrospinal fluid leak from spinal puncture 08/24/2012  . Raynaud's disease    Past Surgical History  Procedure Laterality Date  . Spinal fusion  jan 2011    L4-S1 fusion  . Abdominal hernia repair    . Ablation of spine    . Cholecystectomy    . Uterine ablation    . Abdominal hysterectomy  8/12  . Tonsillectomy    . Fracture surgery    . Cesarean section    . Shoulder surgery Right    Family History  Problem Relation Age of Onset  . Hyperlipidemia Mother   . Depression Mother   . Anxiety disorder Mother    History  Substance Use Topics  . Smoking status: Former Smoker -- 0.50 packs/day    Quit date: 11/13/2008  . Smokeless tobacco: Not  on file  . Alcohol Use: 0.6 oz/week    1 Cans of beer per week     Comment: 1 beer occasionally   OB History   Grav Para Term Preterm Abortions TAB SAB Ect Mult Living                 Review of Systems  Constitutional: Negative for fever, chills and fatigue.  Gastrointestinal: Positive for nausea and abdominal pain. Negative for vomiting, diarrhea and constipation.  Musculoskeletal: Positive for myalgias, back pain and arthralgias.  Skin: Positive for rash.  Neurological: Positive for headaches. Negative for dizziness and light-headedness.  All other systems reviewed and are negative.    Allergies  Penicillins  Home Medications   Current Outpatient Rx  Name  Route  Sig  Dispense  Refill  . ALPRAZolam (XANAX) 0.5 MG tablet   Oral   Take 0.5 mg by mouth 3 (three) times daily as needed for sleep or anxiety. Per Dr. Hurley Cisco         . BIOTIN 5000 PO   Oral   Take 1 tablet by mouth daily.          . Brimonidine Tartrate (MIRVASO) 0.33 % GEL   Apply externally   Apply 1 application topically as needed (face break outs).         Marland Kitchen  cyanocobalamin 100 MCG tablet   Oral   Take 100 mcg by mouth daily.         . felodipine (PLENDIL) 5 MG 24 hr tablet   Oral   Take 5 mg by mouth daily.         . fish oil-omega-3 fatty acids 1000 MG capsule   Oral   Take 1 g by mouth daily.         Marland Kitchen gabapentin (NEURONTIN) 300 MG capsule   Oral   Take 900 mg by mouth at bedtime.          Marland Kitchen HYDROcodone-acetaminophen (NORCO) 10-325 MG per tablet   Oral   Take 1 tablet by mouth every 6 (six) hours as needed for pain. Per Dr. Lovell Sheehan         . hydroxychloroquine (PLAQUENIL) 200 MG tablet   Oral   Take by mouth 2 (two) times daily.         . Misc Natural Products (LAXATIVE FORMULA PO)   Oral   Take 1 tablet by mouth daily as needed. For constipation         . mupirocin ointment (BACTROBAN) 2 %   Topical   Apply 1 application topically 2 (two) times daily.          Marland Kitchen omeprazole (PRILOSEC) 40 MG capsule   Oral   Take 40 mg by mouth daily as needed (acid reflux).         Marland Kitchen oxyCODONE-acetaminophen (PERCOCET) 10-325 MG per tablet   Oral   Take 1 tablet by mouth daily. Dr. Lovell Sheehan         . predniSONE (DELTASONE) 10 MG tablet   Oral   Take 30 mg by mouth 4 (four) times daily. 10 mg in morning 5 mg at lunch 5 mg at dinner and 10 mg at bedtime         . Tetrahydrozoline HCl (EYE DROPS OP)   Both Eyes   Place 2 drops into both eyes as needed (dry eyes).         Marland Kitchen VITAMIN E PO   Oral   Take 1 tablet by mouth daily.         . predniSONE (DELTASONE) 20 MG tablet   Oral   Take 2 tablets (40 mg total) by mouth daily. Take 40 mg by mouth daily for 3 days, then 20mg  by mouth daily for 3 days, then 10mg  daily for 3 days   12 tablet   0    BP 143/84  Pulse 90  Temp(Src) 97.9 F (36.6 C) (Oral)  Resp 13  SpO2 100% Physical Exam  Nursing note and vitals reviewed. Constitutional: She appears well-developed and well-nourished.  Pt lying on exam bed with lights out, appears very anxious. No respiratory distress.   HENT:  Head: Normocephalic and atraumatic.  Mouth/Throat: Oropharynx is clear and moist. No oropharyngeal exudate.  Eyes: Conjunctivae and EOM are normal. Pupils are equal, round, and reactive to light. Right eye exhibits no discharge. Left eye exhibits no discharge. No scleral icterus.  Neck: Normal range of motion. Neck supple.  No nuchal rigidity or meningeal signs.    Cardiovascular: Normal rate, regular rhythm and normal heart sounds.   Pulmonary/Chest: Effort normal and breath sounds normal. No respiratory distress. She has no wheezes. She has no rales. She exhibits no tenderness.  Abdominal: Soft. Bowel sounds are normal. She exhibits no distension and no mass. There is tenderness ( epigastrium and RUQ ). There is  no rebound and no guarding.  Skin: Skin is warm and dry. Rash ( facial rash) noted. She is not diaphoretic.  There is erythema.  Psychiatric: Her mood appears anxious.    ED Course  Procedures (including critical care time) Labs Reviewed  COMPREHENSIVE METABOLIC PANEL - Abnormal; Notable for the following:    Glucose, Bld 102 (*)    All other components within normal limits  CBC WITH DIFFERENTIAL  LIPASE, BLOOD  URINALYSIS, ROUTINE W REFLEX MICROSCOPIC  POCT PREGNANCY, URINE   US Abdomen Complete  04/22/2013   *RADIOLOGY REPORT*  Clinical Data:  Chronic intermittent epigastric pain  COMPLETE ABDOMINAL ULTRASOUND  Comparison:  None.  Findings:  Gallbladder:  The gallbladder has previously been resected.  Common bile duct:  The common bile duct is normal measuring 3.4 mm in diameter.  Liver:  The liver has a normal echogenic pattern.  No focal abnormality is seen.  IVC:  Appears normal.  Pancreas:  No focal abnormality seen.  Spleen:  The spleen is normal in size measuring 5.8 cm with small calcified splenic granulomas present.  Right Kidney:  No hydronephrosis is seen.  The right kidney measures 11.2 cm sagittally.  Left Kidney:  No hydronephrosis is noted.  The left kidney measures 11.1 cm.  Abdominal aorta:  The abdominal aorta is normal in caliber.  IMPRESSION:  1.  No ductal dilatation. 2.  The pancreas is unremarkable. 3.  Prior cholecystectomy.   Original Report Authenticated By: Dwyane Dee, M.D.   1. Headache   2. Rash   3. Abdominal pain     MDM  HA concerning due to increased severity, considering CT head of pt. Due to ongoing epigastric pain and no recent U/S abd will get one today to check for gallstones.  Likely have pt f/u with GI as scheduled for endoscopy.    Lab work:  Urine preg: neg UA: nl CBC: nl CMP: nl Lipase: nl Abd U/S: no ductal dilatation, pancrease unremarkable, prior cholecystectomy   Tx in ED: ativan, decadron, toradol, and reglan.  Discussed with pt starting her on short course of steroids.  Pt mentioned she had "left over" prednisone she was trying to take but  "does not have enough left" Discussed with pt to stop taking "left over" pills and restart new prednisone pack prescribed today.  Pt verbalized understanding and agreement with tx plan.  Will refer her to [facility] for further workup and advised to to f/u with her PCP, GI specialist, and rheumatologist as scheduled. Vitals: unremarkable. Discharged in stable condition.    Discussed pt with attending during ED encounter.     MDM Number of Diagnoses or Management Options Abdominal pain:  Headache:  Rash:      Junius Finner, PA-C 04/22/13 1344

## 2013-04-22 NOTE — ED Notes (Signed)
Pt states that she has rash, facial pain, abdominal Pain and nausea. Pt has been tested recently for lupus. Pt anxious at present.

## 2013-04-22 NOTE — ED Notes (Signed)
Pt reports taking 1 vicoden for chronic pain in her back today

## 2013-04-23 ENCOUNTER — Telehealth: Payer: Self-pay

## 2013-04-23 ENCOUNTER — Other Ambulatory Visit: Payer: Self-pay | Admitting: General Practice

## 2013-04-23 DIAGNOSIS — Z889 Allergy status to unspecified drugs, medicaments and biological substances status: Secondary | ICD-10-CM

## 2013-04-23 NOTE — Telephone Encounter (Signed)
Referrals she be calling with an appointment once confirmed. thx

## 2013-04-23 NOTE — ED Provider Notes (Signed)
Shared service with midlevel provider. I have personally seen and examined the patient, providing direct face to face care, presenting with the chief complaint of headache, abd pain. Physical exam findings include normal neuro exam,  malar rash - has hx of some autoimmune condition, but has had a negative SLE workup in the past. Plan will be to get GI labs, control the headache and request RHEUM follow up and ALLERGIST f/u. I have reviewed the nursing documentation on past medical history, family history, and social history.   Derwood Kaplan, MD 04/23/13 1542

## 2013-04-23 NOTE — Telephone Encounter (Signed)
Wants referral to Allergy and Asthma  Went to ER yesterday and they said to get referral for allergy testing

## 2013-04-24 ENCOUNTER — Ambulatory Visit (INDEPENDENT_AMBULATORY_CARE_PROVIDER_SITE_OTHER): Payer: Medicaid Other | Admitting: Internal Medicine

## 2013-04-24 ENCOUNTER — Telehealth: Payer: Self-pay | Admitting: Nurse Practitioner

## 2013-04-24 NOTE — Telephone Encounter (Signed)
Patient aware.

## 2013-04-24 NOTE — Telephone Encounter (Signed)
appt given for tomorrow with bill oxford

## 2013-04-25 ENCOUNTER — Ambulatory Visit (INDEPENDENT_AMBULATORY_CARE_PROVIDER_SITE_OTHER): Payer: Medicaid Other | Admitting: Family Medicine

## 2013-04-25 ENCOUNTER — Encounter: Payer: Self-pay | Admitting: Family Medicine

## 2013-04-25 VITALS — BP 94/62 | HR 80 | Temp 97.2°F | Ht 64.0 in | Wt 145.2 lb

## 2013-04-25 DIAGNOSIS — R51 Headache: Secondary | ICD-10-CM

## 2013-04-25 DIAGNOSIS — J069 Acute upper respiratory infection, unspecified: Secondary | ICD-10-CM

## 2013-04-25 DIAGNOSIS — Z4802 Encounter for removal of sutures: Secondary | ICD-10-CM

## 2013-04-25 MED ORDER — DOXYCYCLINE HYCLATE 100 MG PO TABS: 100.0000 mg | ORAL_TABLET | Freq: Two times a day (BID) | ORAL | Status: DC

## 2013-04-25 NOTE — Progress Notes (Signed)
  Subjective:    Patient ID: Maureen [name], female    DOB: 03/26/1966, 47 y.o.   MRN: [id number]  HPI  This 47 y.o. female presents for evaluation of removal of sutures on her right face from recent biopsy from Derm.  She has been having sinus drainage, sinus pressure, and URI sx's for over a week.  Review of Systems    No chest pain, SOB, HA, dizziness, vision change, N/V, diarrhea, constipation, dysuria, urinary urgency or frequency, myalgias, arthralgias or rash.  Objective:   Physical Exam Vital signs noted  Well developed well nourished female.  HEENT - Head atraumatic Normocephalic                Eyes - PERRLA, Conjuctiva - clear Sclera- Clear EOMI                Ears - EAC's Wnl TM's Wnl Gross Hearing WNL                Nose - Nares patent                 Throat - oropharanx wnl                Face - 2 small sutures left cheek. Respiratory - Lungs CTA bilateral Cardiac - RRR S1 and S2 without murmur        Assessment & Plan:  Acute upper respiratory infections of unspecified site Doxycycline 100mg  po bid x 10 days.  Headache(784.0) Tylenol and motrin otc prn  Encounter for removal of sutures 2 small sutures removed from right cheek.

## 2013-04-25 NOTE — Patient Instructions (Signed)

## 2013-04-29 ENCOUNTER — Telehealth: Payer: Self-pay | Admitting: Family Medicine

## 2013-04-30 ENCOUNTER — Other Ambulatory Visit: Payer: Self-pay | Admitting: Family Medicine

## 2013-04-30 ENCOUNTER — Ambulatory Visit: Payer: Medicaid Other | Attending: Orthopedic Surgery

## 2013-04-30 DIAGNOSIS — M6281 Muscle weakness (generalized): Secondary | ICD-10-CM | POA: Insufficient documentation

## 2013-04-30 DIAGNOSIS — M25519 Pain in unspecified shoulder: Secondary | ICD-10-CM | POA: Insufficient documentation

## 2013-04-30 DIAGNOSIS — R5381 Other malaise: Secondary | ICD-10-CM | POA: Insufficient documentation

## 2013-04-30 DIAGNOSIS — IMO0001 Reserved for inherently not codable concepts without codable children: Secondary | ICD-10-CM | POA: Insufficient documentation

## 2013-04-30 MED ORDER — AZITHROMYCIN 250 MG PO TABS: ORAL_TABLET | ORAL | Status: DC

## 2013-05-01 DIAGNOSIS — Z79899 Other long term (current) drug therapy: Secondary | ICD-10-CM | POA: Insufficient documentation

## 2013-05-02 ENCOUNTER — Encounter: Payer: Medicaid Other | Admitting: Physical Therapy

## 2013-05-06 ENCOUNTER — Ambulatory Visit: Payer: Medicaid Other | Admitting: Physical Therapy

## 2013-05-08 ENCOUNTER — Ambulatory Visit (HOSPITAL_COMMUNITY)
Admission: RE | Admit: 2013-05-08 | Discharge: 2013-05-08 | Disposition: A | Discharge status: Home / Self Care | Payer: Medicaid Other | Source: Ambulatory Visit | Attending: Internal Medicine | Admitting: Internal Medicine

## 2013-05-08 ENCOUNTER — Encounter (HOSPITAL_COMMUNITY)
Admission: RE | Disposition: A | Discharge status: Home / Self Care | Payer: Self-pay | Source: Ambulatory Visit | Attending: Internal Medicine

## 2013-05-08 ENCOUNTER — Encounter (HOSPITAL_COMMUNITY): Payer: Self-pay

## 2013-05-08 DIAGNOSIS — D131 Benign neoplasm of stomach: Secondary | ICD-10-CM | POA: Insufficient documentation

## 2013-05-08 DIAGNOSIS — K319 Disease of stomach and duodenum, unspecified: Secondary | ICD-10-CM | POA: Insufficient documentation

## 2013-05-08 DIAGNOSIS — K219 Gastro-esophageal reflux disease without esophagitis: Secondary | ICD-10-CM | POA: Insufficient documentation

## 2013-05-08 DIAGNOSIS — R1013 Epigastric pain: Secondary | ICD-10-CM

## 2013-05-08 DIAGNOSIS — Z87891 Personal history of nicotine dependence: Secondary | ICD-10-CM | POA: Insufficient documentation

## 2013-05-08 DIAGNOSIS — K2960 Other gastritis without bleeding: Secondary | ICD-10-CM | POA: Insufficient documentation

## 2013-05-08 DIAGNOSIS — Z90710 Acquired absence of both cervix and uterus: Secondary | ICD-10-CM | POA: Insufficient documentation

## 2013-05-08 DIAGNOSIS — K2990 Gastroduodenitis, unspecified, without bleeding: Secondary | ICD-10-CM

## 2013-05-08 DIAGNOSIS — IMO0002 Reserved for concepts with insufficient information to code with codable children: Secondary | ICD-10-CM | POA: Insufficient documentation

## 2013-05-08 DIAGNOSIS — Z981 Arthrodesis status: Secondary | ICD-10-CM | POA: Insufficient documentation

## 2013-05-08 DIAGNOSIS — F3289 Other specified depressive episodes: Secondary | ICD-10-CM | POA: Insufficient documentation

## 2013-05-08 DIAGNOSIS — I7300 Raynaud's syndrome without gangrene: Secondary | ICD-10-CM | POA: Insufficient documentation

## 2013-05-08 DIAGNOSIS — F411 Generalized anxiety disorder: Secondary | ICD-10-CM | POA: Insufficient documentation

## 2013-05-08 DIAGNOSIS — Z85828 Personal history of other malignant neoplasm of skin: Secondary | ICD-10-CM | POA: Insufficient documentation

## 2013-05-08 DIAGNOSIS — G8929 Other chronic pain: Secondary | ICD-10-CM

## 2013-05-08 DIAGNOSIS — M129 Arthropathy, unspecified: Secondary | ICD-10-CM | POA: Insufficient documentation

## 2013-05-08 DIAGNOSIS — K2970 Gastritis, unspecified, without bleeding: Secondary | ICD-10-CM

## 2013-05-08 DIAGNOSIS — R634 Abnormal weight loss: Secondary | ICD-10-CM | POA: Insufficient documentation

## 2013-05-08 DIAGNOSIS — R1011 Right upper quadrant pain: Secondary | ICD-10-CM

## 2013-05-08 DIAGNOSIS — K589 Irritable bowel syndrome without diarrhea: Secondary | ICD-10-CM | POA: Insufficient documentation

## 2013-05-08 DIAGNOSIS — F329 Major depressive disorder, single episode, unspecified: Secondary | ICD-10-CM | POA: Insufficient documentation

## 2013-05-08 DIAGNOSIS — Z79899 Other long term (current) drug therapy: Secondary | ICD-10-CM | POA: Insufficient documentation

## 2013-05-08 DIAGNOSIS — R110 Nausea: Secondary | ICD-10-CM

## 2013-05-08 DIAGNOSIS — Z880 Allergy status to penicillin: Secondary | ICD-10-CM | POA: Insufficient documentation

## 2013-05-08 HISTORY — PX: ESOPHAGOGASTRODUODENOSCOPY: SHX5428

## 2013-05-08 SURGERY — EGD (ESOPHAGOGASTRODUODENOSCOPY)
Anesthesia: Moderate Sedation

## 2013-05-08 MED ORDER — MEPERIDINE HCL 50 MG/ML IJ SOLN
INTRAMUSCULAR | Status: AC
  Filled 2013-05-08: qty 1

## 2013-05-08 MED ORDER — MIDAZOLAM HCL 5 MG/5ML IJ SOLN
INTRAMUSCULAR | Status: DC | PRN
  Administered 2013-05-08: 2 mg via INTRAVENOUS
  Administered 2013-05-08: 1 mg via INTRAVENOUS
  Administered 2013-05-08 (×2): 2 mg via INTRAVENOUS
  Administered 2013-05-08 (×2): 3 mg via INTRAVENOUS
  Administered 2013-05-08: 2 mg via INTRAVENOUS

## 2013-05-08 MED ORDER — STERILE WATER FOR IRRIGATION IR SOLN
Status: DC | PRN
  Administered 2013-05-08: 14:00:00

## 2013-05-08 MED ORDER — PROMETHAZINE HCL 25 MG/ML IJ SOLN
INTRAMUSCULAR | Status: AC
  Filled 2013-05-08: qty 1

## 2013-05-08 MED ORDER — BUTAMBEN-TETRACAINE-BENZOCAINE 2-2-14 % EX AERO
INHALATION_SPRAY | CUTANEOUS | Status: DC | PRN
  Administered 2013-05-08: 2 via TOPICAL

## 2013-05-08 MED ORDER — SODIUM CHLORIDE 0.9 % IJ SOLN
INTRAMUSCULAR | Status: AC
  Filled 2013-05-08: qty 10

## 2013-05-08 MED ORDER — SUCRALFATE 1 GM/10ML PO SUSP: 1.0000 g | Freq: Four times a day (QID) | ORAL | Status: DC

## 2013-05-08 MED ORDER — MIDAZOLAM HCL 5 MG/5ML IJ SOLN
INTRAMUSCULAR | Status: AC
  Filled 2013-05-08: qty 10

## 2013-05-08 MED ORDER — PROMETHAZINE HCL 25 MG/ML IJ SOLN
INTRAMUSCULAR | Status: DC | PRN
  Administered 2013-05-08 (×2): 12.5 mg via INTRAVENOUS

## 2013-05-08 MED ORDER — SODIUM CHLORIDE 0.9 % IV SOLN
INTRAVENOUS | Status: DC
Start: 2013-05-08 — End: 2013-05-08
  Administered 2013-05-08: 13:00:00 via INTRAVENOUS

## 2013-05-08 MED ORDER — MEPERIDINE HCL 25 MG/ML IJ SOLN
INTRAMUSCULAR | Status: DC | PRN
  Administered 2013-05-08 (×2): 25 mg via INTRAVENOUS

## 2013-05-08 MED ORDER — MIDAZOLAM HCL 5 MG/5ML IJ SOLN
INTRAMUSCULAR | Status: AC
  Filled 2013-05-08: qty 5

## 2013-05-08 NOTE — H&P (Signed)
Maureen [name] is an 47 y.o. female.   Chief Complaint: Patient is here for EGD. HPI: Patient is 47 year old Caucasian female who presents with a recurrent symptoms of epigastric and right upper quadrant pain nausea and he has lost close to 20 pounds over last several months. He does not have appetite. She denies vomiting. She denies melena or rectal bleeding. Her bowels are irregular. She there has tarry and/or constipation. She has been diagnosed with IBS. She is status post cholecystectomy in 2009. She took Advil daily for a few days back in March and she had shoulder pain. He does not smoke cigarettes and drinks alcohol occasionally.  Past Medical History  Diagnosis Date  . Irritable bowel syndrome (IBS)   . Abdominal pain   . Fusion of spine Jan 2011    L4-S1 fusion  . Depression   . Anxiety   . Cancer     MOLE PRE  . GERD (gastroesophageal reflux disease)   . Headache(784.0)     SINUS  . Arthritis   . Cerebrospinal fluid leak from spinal puncture 08/24/2012  . Raynaud's disease     Past Surgical History  Procedure Laterality Date  . Spinal fusion  jan 2011    L4-S1 fusion  . Abdominal hernia repair    . Ablation of spine    . Cholecystectomy    . Uterine ablation    . Abdominal hysterectomy  8/12  . Tonsillectomy    . Fracture surgery    . Cesarean section    . Shoulder surgery Right     Family History  Problem Relation Age of Onset  . Hyperlipidemia Mother   . Depression Mother   . Anxiety disorder Mother    Social History:  reports that she quit smoking about 4 years ago. She does not have any smokeless tobacco history on file. She reports that she drinks about 0.6 ounces of alcohol per week. She reports that she does not use illicit drugs.  Allergies:  Allergies  Allergen Reactions  . Penicillins Rash    Medications Prior to Admission  Medication Sig Dispense Refill  . ALPRAZolam (XANAX) 0.5 MG tablet Take 0.5 mg by mouth 3 (three) times daily as needed  for sleep or anxiety. Per Dr. Hurley Cisco      . azithromycin (ZITHROMAX) 250 MG tablet Take 2 po first days and then one po qd x 4days  6 tablet  0  . BIOTIN 5000 PO Take 1 tablet by mouth daily.       . Brimonidine Tartrate (MIRVASO) 0.33 % GEL Apply 1 application topically as needed (face break outs).      . cyanocobalamin 100 MCG tablet Take 100 mcg by mouth daily.      . felodipine (PLENDIL) 5 MG 24 hr tablet Take 5 mg by mouth daily.      . fish oil-omega-3 fatty acids 1000 MG capsule Take 1 g by mouth daily.      Marland Kitchen gabapentin (NEURONTIN) 300 MG capsule Take 900 mg by mouth at bedtime.       Marland Kitchen HYDROcodone-acetaminophen (NORCO) 10-325 MG per tablet Take 1 tablet by mouth every 6 (six) hours as needed for pain. Per Dr. Lovell Sheehan      . hydroxychloroquine (PLAQUENIL) 200 MG tablet Take by mouth 2 (two) times daily.      . Misc Natural Products (LAXATIVE FORMULA PO) Take 1 tablet by mouth daily as needed. For constipation      . mupirocin ointment (BACTROBAN)  2 % Apply 1 application topically 2 (two) times daily.      Marland Kitchen omeprazole (PRILOSEC) 40 MG capsule Take 40 mg by mouth daily as needed (acid reflux).      Marland Kitchen oxyCODONE-acetaminophen (PERCOCET) 10-325 MG per tablet Take 1 tablet by mouth daily. Dr. Lovell Sheehan      . predniSONE (DELTASONE) 10 MG tablet Take 30 mg by mouth 4 (four) times daily. 10 mg in morning 5 mg at lunch 5 mg at dinner and 10 mg at bedtime      . predniSONE (DELTASONE) 20 MG tablet Take 2 tablets (40 mg total) by mouth daily. Take 40 mg by mouth daily for 3 days, then 20mg  by mouth daily for 3 days, then 10mg  daily for 3 days  12 tablet  0  . Tetrahydrozoline HCl (EYE DROPS OP) Place 2 drops into both eyes as needed (dry eyes).      Marland Kitchen VITAMIN E PO Take 1 tablet by mouth daily.      Marland Kitchen doxycycline (VIBRA-TABS) 100 MG tablet Take 1 tablet (100 mg total) by mouth 2 (two) times daily.  20 tablet  0    No results found for this or any previous visit (from the past 48 hour(s)). No results  found.  ROS  Blood pressure 113/70, pulse 82, temperature 98.2 F (36.8 C), temperature source Oral, resp. rate 22, SpO2 99.00%. Physical Exam  Constitutional: She appears well-developed and well-nourished.  HENT:  Mouth/Throat: Oropharynx is clear and moist.  Eyes: Conjunctivae are normal. No scleral icterus.  Neck: No thyromegaly present.  Cardiovascular: Normal rate, regular rhythm and normal heart sounds.   No murmur heard. Respiratory: Effort normal and breath sounds normal.  GI: Soft. Bowel sounds are normal. She exhibits mass. She exhibits no distension. There is tenderness (mild to moderate midepigastric tenderness. Mild tenderness at RUQ).  Musculoskeletal: She exhibits no edema.  Lymphadenopathy:    She has no cervical adenopathy.  Neurological: She is alert.  Skin: Skin is warm and dry.     Assessment/Plan Epigastric and right upper quadrant pain. Nausea and weight loss. Diagnostic EGD.  Tyona Nilsen U 05/08/2013, 1:49 PM

## 2013-05-08 NOTE — Op Note (Addendum)
EGD PROCEDURE REPORT  PATIENT:  Maureen [name]  MR#:  [id number] Birthdate:  1966/05/24, 47 y.o., female Endoscopist:  Dr. Malissa Hippo, MD Referred By:  Dr. Rudi Heap, MD Procedure Date: 05/08/2013  Procedure:   EGD  Indications:  Patient is 47 year old Caucasian female who presents with chronic nausea and epigastric and right upper quadrant pain as well as weight loss.            Informed Consent:  The risks, benefits, alternatives & imponderables which include, but are not limited to, bleeding, infection, perforation, drug reaction and potential missed lesion have been reviewed.  The potential for biopsy, lesion removal, esophageal dilation, etc. have also been discussed.  Questions have been answered.  All parties agreeable.  Please see history & physical in medical record for more information.  Medications:  Demerol 50 mg IV Versed 15 mg IV Promethazine 25 mg IV and diluted form Cetacaine spray topically for oropharyngeal anesthesia  Description of procedure:  The endoscope was introduced through the mouth and advanced to the second portion of the duodenum without difficulty or limitations. The mucosal surfaces were surveyed very carefully during advancement of the scope and upon withdrawal.  Findings:  Esophagus:  Mucosa of the esophagus was normal. GE junction was unremarkable. GEJ:  40 cm Stomach:  Moderate amount of bile present in the stomach but no food debris identified. Stomach distended very well with insufflation. Folds in the proximal stomach were normal. Examination mucosa and gastric body was normal except 3 mm hyperplastic-appearing polyp which was left alone. Patchy erythema and edema noted at antrum without erosions. Pyloric channel was patent. Angularis fundus and cardia were examined by retroflex the scope. Duodenum:  Normal bulbar and post bulbar mucosa.  Therapeutic/Diagnostic Maneuvers Performed:  Multiple biopsies taken from mucosa of the gastric antrum for  routine histology.  Complications:  None  Impression: No evidence of erosive esophagitis or peptic ulcer disease. Duodenogastric bile reflux. Nonerosive antral gastritis. Biopsy taken from antral mucosa. 3 mm hyperplastic-appearing polyp in the gastric body which was left alone.  Recommendations:  Patient advised to take omeprazole 40 mg by mouth every morning and not on when necessary basis. Sucralfate suspension 1 g by mouth a.c. and each bedtime. I will be contacting dictation results of biopsy and further recommendations.  Finas Delone U  05/08/2013  2:26 PM  CC: Dr. Rudi Heap, MD & Dr. Bonnetta Barry ref. provider found

## 2013-05-08 NOTE — Discharge Instructions (Signed)
Resume usual medications and diet. Take omeprazole by mouth 30 minutes before breakfast daily. Sucralfate 1 g by mouth 30-60 minutes before each meal and at bedtime. No driving for 24 hours. Physician will contact you with biopsy results  Gastrointestinal Endoscopy Care After Refer to this sheet in the next few weeks. These instructions provide you with information on caring for yourself after your procedure. Your caregiver may also give you more specific instructions. Your treatment has been planned according to current medical practices, but problems sometimes occur. Call your caregiver if you have any problems or questions after your procedure. HOME CARE INSTRUCTIONS  If you were given medicine to help you relax (sedative), do not drive, operate machinery, or sign important documents for 24 hours.  Avoid alcohol and hot or warm beverages for the first 24 hours after the procedure.  Only take over-the-counter or prescription medicines for pain, discomfort, or fever as directed by your caregiver. You may resume taking your normal medicines unless your caregiver tells you otherwise. Ask your caregiver when you may resume taking medicines that may cause bleeding, such as aspirin, clopidogrel, or warfarin.  You may return to your normal diet and activities on the day after your procedure, or as directed by your caregiver. Walking may help to reduce any bloated feeling in your abdomen.  Drink enough fluids to keep your urine clear or pale yellow.  You may gargle with salt water if you have a sore throat. SEEK IMMEDIATE MEDICAL CARE IF:  You have severe nausea or vomiting.  You have severe abdominal pain, abdominal cramps that last longer than 6 hours, or abdominal swelling (distention).  You have severe shoulder or back pain.  You have trouble swallowing.  You have shortness of breath, your breathing is shallow, or you are breathing faster than normal.  You have a fever or a rapid  heartbeat.  You vomit blood or material that looks like coffee grounds.  You have bloody, black, or tarry stools. MAKE SURE YOU:  Understand these instructions.  Will watch your condition.  Will get help right away if you are not doing well or get worse. Document Released: 05/24/2004 Document Revised: 04/10/2012 Document Reviewed: 01/10/2012 [facility] Patient Information 2014 [place], [place].

## 2013-05-09 ENCOUNTER — Encounter (INDEPENDENT_AMBULATORY_CARE_PROVIDER_SITE_OTHER): Payer: Self-pay | Admitting: Internal Medicine

## 2013-05-09 ENCOUNTER — Encounter (HOSPITAL_COMMUNITY): Payer: Self-pay | Admitting: Internal Medicine

## 2013-05-15 ENCOUNTER — Encounter (INDEPENDENT_AMBULATORY_CARE_PROVIDER_SITE_OTHER): Payer: Self-pay | Admitting: *Deleted

## 2013-05-20 ENCOUNTER — Telehealth: Payer: Self-pay | Admitting: Family Medicine

## 2013-05-20 NOTE — Telephone Encounter (Signed)
APPT MADE

## 2013-05-23 ENCOUNTER — Encounter: Payer: Self-pay | Admitting: Family Medicine

## 2013-05-23 ENCOUNTER — Ambulatory Visit (INDEPENDENT_AMBULATORY_CARE_PROVIDER_SITE_OTHER): Payer: Medicaid Other | Admitting: Family Medicine

## 2013-05-23 VITALS — BP 104/78 | HR 83 | Temp 97.4°F | Ht 64.0 in | Wt 147.0 lb

## 2013-05-23 DIAGNOSIS — R110 Nausea: Secondary | ICD-10-CM

## 2013-05-23 DIAGNOSIS — K219 Gastro-esophageal reflux disease without esophagitis: Secondary | ICD-10-CM

## 2013-05-23 DIAGNOSIS — M25559 Pain in unspecified hip: Secondary | ICD-10-CM

## 2013-05-23 DIAGNOSIS — M329 Systemic lupus erythematosus, unspecified: Secondary | ICD-10-CM

## 2013-05-23 MED ORDER — ONDANSETRON 8 MG PO TBDP: 8.0000 mg | ORAL_TABLET | Freq: Three times a day (TID) | ORAL | Status: DC | PRN

## 2013-05-23 NOTE — Patient Instructions (Signed)
Knee Pain  The knee is the complex joint between your thigh and your lower leg. It is made up of bones, tendons, ligaments, and cartilage. The bones that make up the knee are:   The femur in the thigh.   The tibia and fibula in the lower leg.   The patella or kneecap riding in the groove on the lower femur.  CAUSES   Knee pain is a common complaint with many causes. A few of these causes are:   Injury, such as:   A ruptured ligament or tendon injury.   Torn cartilage.   Medical conditions, such as:   Gout   Arthritis   Infections   Overuse, over training or overdoing a physical activity.  Knee pain can be minor or severe. Knee pain can accompany debilitating injury. Minor knee problems often respond well to self-care measures or get well on their own. More serious injuries may need medical intervention or even surgery.  SYMPTOMS  The knee is complex. Symptoms of knee problems can vary widely. Some of the problems are:   Pain with movement and weight bearing.   Swelling and tenderness.   Buckling of the knee.   Inability to straighten or extend your knee.   Your knee locks and you cannot straighten it.   Warmth and redness with pain and fever.   Deformity or dislocation of the kneecap.  DIAGNOSIS   Determining what is wrong may be very straight forward such as when there is an injury. It can also be challenging because of the complexity of the knee. Tests to make a diagnosis may include:   Your caregiver taking a history and doing a physical exam.   Routine X-rays can be used to rule out other problems. X-rays will not reveal a cartilage tear. Some injuries of the knee can be diagnosed by:   Arthroscopy a surgical technique by which a small video camera is inserted through tiny incisions on the sides of the knee. This procedure is used to examine and repair internal knee joint problems. Tiny instruments can be used during arthroscopy to repair the torn knee cartilage (meniscus).   Arthrography  is a radiology technique. A contrast liquid is directly injected into the knee joint. Internal structures of the knee joint then become visible on X-ray film.   An MRI scan is a non x-ray radiology procedure in which magnetic fields and a computer produce two- or three-dimensional images of the inside of the knee. Cartilage tears are often visible using an MRI scanner. MRI scans have largely replaced arthrography in diagnosing cartilage tears of the knee.   Blood work.   Examination of the fluid that helps to lubricate the knee joint (synovial fluid). This is done by taking a sample out using a needle and a syringe.  TREATMENT  The treatment of knee problems depends on the cause. Some of these treatments are:   Depending on the injury, proper casting, splinting, surgery or physical therapy care will be needed.   Give yourself adequate recovery time. Do not overuse your joints. If you begin to get sore during workout routines, back off. Slow down or do fewer repetitions.   For repetitive activities such as cycling or running, maintain your strength and nutrition.   Alternate muscle groups. For example if you are a weight lifter, work the upper body on one day and the lower body the next.   Either tight or weak muscles do not give the proper support for your   knee. Tight or weak muscles do not absorb the stress placed on the knee joint. Keep the muscles surrounding the knee strong.   Take care of mechanical problems.   If you have flat feet, orthotics or special shoes may help. See your caregiver if you need help.   Arch supports, sometimes with wedges on the inner or outer aspect of the heel, can help. These can shift pressure away from the side of the knee most bothered by osteoarthritis.   A brace called an "unloader" brace also may be used to help ease the pressure on the most arthritic side of the knee.   If your caregiver has prescribed crutches, braces, wraps or ice, use as directed. The acronym for  this is PRICE. This means protection, rest, ice, compression and elevation.   Nonsteroidal anti-inflammatory drugs (NSAID's), can help relieve pain. But if taken immediately after an injury, they may actually increase swelling. Take NSAID's with food in your stomach. Stop them if you develop stomach problems. Do not take these if you have a history of ulcers, stomach pain or bleeding from the bowel. Do not take without your caregiver's approval if you have problems with fluid retention, heart failure, or kidney problems.   For ongoing knee problems, physical therapy may be helpful.   Glucosamine and chondroitin are over-the-counter dietary supplements. Both may help relieve the pain of osteoarthritis in the knee. These medicines are different from the usual anti-inflammatory drugs. Glucosamine may decrease the rate of cartilage destruction.   Injections of a corticosteroid drug into your knee joint may help reduce the symptoms of an arthritis flare-up. They may provide pain relief that lasts a few months. You may have to wait a few months between injections. The injections do have a small increased risk of infection, water retention and elevated blood sugar levels.   Hyaluronic acid injected into damaged joints may ease pain and provide lubrication. These injections may work by reducing inflammation. A series of shots may give relief for as long as 6 months.   Topical painkillers. Applying certain ointments to your skin may help relieve the pain and stiffness of osteoarthritis. Ask your pharmacist for suggestions. Many over the-counter products are approved for temporary relief of arthritis pain.   In some countries, doctors often prescribe topical NSAID's for relief of chronic conditions such as arthritis and tendinitis. A review of treatment with NSAID creams found that they worked as well as oral medications but without the serious side effects.  PREVENTION   Maintain a healthy weight. Extra pounds put  more strain on your joints.   Get strong, stay limber. Weak muscles are a common cause of knee injuries. Stretching is important. Include flexibility exercises in your workouts.   Be smart about exercise. If you have osteoarthritis, chronic knee pain or recurring injuries, you may need to change the way you exercise. This does not mean you have to stop being active. If your knees ache after jogging or playing basketball, consider switching to swimming, water aerobics or other low-impact activities, at least for a few days a week. Sometimes limiting high-impact activities will provide relief.   Make sure your shoes fit well. Choose footwear that is right for your sport.   Protect your knees. Use the proper gear for knee-sensitive activities. Use kneepads when playing volleyball or laying carpet. Buckle your seat belt every time you drive. Most shattered kneecaps occur in car accidents.   Rest when you are tired.  SEEK MEDICAL CARE IF:     You have knee pain that is continual and does not seem to be getting better.   SEEK IMMEDIATE MEDICAL CARE IF:   Your knee joint feels hot to the touch and you have a high fever.  MAKE SURE YOU:    Understand these instructions.   Will watch your condition.   Will get help right away if you are not doing well or get worse.  Document Released: 08/07/2007 Document Revised: 01/02/2012 Document Reviewed: 08/07/2007  [employer] Patient Information 2014 [employer].

## 2013-05-23 NOTE — Progress Notes (Signed)
  Subjective:    Patient ID: Maureen [name], female    DOB: 1966-08-23, 47 y.o.   MRN: [id number]  HPI This 47 y.o. 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  She is having bilateral knee swelling and discomfort. She has hx of DDD and has had dsome back surgery and she has recently has been dx with SLE by her RA. She has had recent biopsy results come back for SLE with path report. .   Review of Systems C/o bilateral knee discomfort.   No chest pain, SOB, HA, dizziness, vision change, N/V, diarrhea, constipation, dysuria, urinary urgency or frequency or rash.  Objective:   Physical Exam  Vital signs noted  Well developed well nourished female.  HEENT - Head atraumatic Normocephalic                Eyes - PERRLA, Conjuctiva - clear Sclera- Clear EOMI                Ears - EAC's Wnl TM's Wnl Gross Hearing WNL                Nose - Nares patent                 Throat - oropharanx wnl Respiratory - Lungs CTA bilateral Cardiac - RRR S1 and S2 without murmur GI - Abdomen soft Nontender and bowel sounds active x 4 MS - TTP bilateral knees. Extremities - No edema. Neuro - Grossly intact.      Assessment & Plan:  SLE (systemic lupus erythematosus related syndrome) - Plan: CBC with Differential, Hepatic function  And fax results to RA at [facility].  Nausea alone - Plan: ondansetron (ZOFRAN ODT) 8 MG disintegrating tablet.  Chronic arthralgias of knees and hips, unspecified laterality - Follow up with RA for treatments as I think her Knee pain is due to her RA.  GERD (gastroesophageal reflux disease) - Continue Omeprazole  Rhinitis - Sample of dymista with instructions for one spray per nostril bid given.

## 2013-05-24 LAB — HEPATIC FUNCTION PANEL
ALT: 18 IU/L (ref 0–32)
AST: 19 IU/L (ref 0–40)
Albumin: 4.5 g/dL (ref 3.5–5.5)
Alkaline Phosphatase: 45 IU/L (ref 39–117)
Bilirubin, Direct: 0.06 mg/dL (ref 0.00–0.40)
Total Bilirubin: 0.3 mg/dL (ref 0.0–1.2)
Total Protein: 6.4 g/dL (ref 6.0–8.5)

## 2013-05-24 LAB — CBC WITH DIFFERENTIAL/PLATELET
Basophils Absolute: 0 10*3/uL (ref 0.0–0.2)
Basos: 0 % (ref 0–3)
Eos: 2 % (ref 0–5)
Eosinophils Absolute: 0.1 10*3/uL (ref 0.0–0.4)
HCT: 37.6 % (ref 34.0–46.6)
Hemoglobin: 12.6 g/dL (ref 11.1–15.9)
Immature Grans (Abs): 0 10*3/uL (ref 0.0–0.1)
Immature Granulocytes: 0 % (ref 0–2)
Lymphocytes Absolute: 1.5 10*3/uL (ref 0.7–3.1)
Lymphs: 45 % (ref 14–46)
MCH: 30.9 pg (ref 26.6–33.0)
MCHC: 33.5 g/dL (ref 31.5–35.7)
MCV: 92 fL (ref 79–97)
Monocytes Absolute: 0.4 10*3/uL (ref 0.1–0.9)
Monocytes: 11 % (ref 4–12)
Neutrophils Absolute: 1.4 10*3/uL (ref 1.4–7.0)
Neutrophils Relative %: 42 % (ref 40–74)
RBC: 4.08 x10E6/uL (ref 3.77–5.28)
RDW: 13.9 % (ref 12.3–15.4)
WBC: 3.4 10*3/uL (ref 3.4–10.8)

## 2013-06-04 ENCOUNTER — Ambulatory Visit: Payer: Medicaid Other | Attending: Orthopedic Surgery

## 2013-06-04 DIAGNOSIS — M6281 Muscle weakness (generalized): Secondary | ICD-10-CM | POA: Insufficient documentation

## 2013-06-04 DIAGNOSIS — R5381 Other malaise: Secondary | ICD-10-CM | POA: Insufficient documentation

## 2013-06-04 DIAGNOSIS — IMO0001 Reserved for inherently not codable concepts without codable children: Secondary | ICD-10-CM | POA: Insufficient documentation

## 2013-06-04 DIAGNOSIS — M25519 Pain in unspecified shoulder: Secondary | ICD-10-CM | POA: Insufficient documentation

## 2013-06-06 ENCOUNTER — Encounter: Payer: Medicaid Other | Admitting: *Deleted

## 2013-06-10 ENCOUNTER — Ambulatory Visit: Payer: Medicaid Other | Admitting: Physical Therapy

## 2013-06-13 ENCOUNTER — Encounter: Payer: Medicaid Other | Admitting: Physical Therapy

## 2013-06-14 ENCOUNTER — Other Ambulatory Visit: Payer: Self-pay | Admitting: Nurse Practitioner

## 2013-06-17 ENCOUNTER — Ambulatory Visit: Payer: Medicaid Other | Admitting: Physical Therapy

## 2013-06-21 ENCOUNTER — Other Ambulatory Visit (INDEPENDENT_AMBULATORY_CARE_PROVIDER_SITE_OTHER): Payer: Medicaid Other

## 2013-06-21 DIAGNOSIS — M329 Systemic lupus erythematosus, unspecified: Secondary | ICD-10-CM

## 2013-06-21 NOTE — Progress Notes (Signed)
Pt came in for labs for dr. Carolan Clines at [facility]

## 2013-06-22 LAB — CBC WITH DIFFERENTIAL
Basophils Absolute: 0 10*3/uL (ref 0.0–0.2)
Basos: 0 % (ref 0–3)
Eos: 0 % (ref 0–5)
Eosinophils Absolute: 0 10*3/uL (ref 0.0–0.4)
HCT: 37.5 % (ref 34.0–46.6)
Hemoglobin: 13 g/dL (ref 11.1–15.9)
Immature Grans (Abs): 0 10*3/uL (ref 0.0–0.1)
Immature Granulocytes: 0 % (ref 0–2)
Lymphocytes Absolute: 2 10*3/uL (ref 0.7–3.1)
Lymphs: 34 % (ref 14–46)
MCH: 31.9 pg (ref 26.6–33.0)
MCHC: 34.7 g/dL (ref 31.5–35.7)
MCV: 92 fL (ref 79–97)
Monocytes Absolute: 0.5 10*3/uL (ref 0.1–0.9)
Monocytes: 9 % (ref 4–12)
Neutrophils Absolute: 3.3 10*3/uL (ref 1.4–7.0)
Neutrophils Relative %: 57 % (ref 40–74)
Platelets: 298 10*3/uL (ref 150–379)
RBC: 4.07 x10E6/uL (ref 3.77–5.28)
RDW: 13.5 % (ref 12.3–15.4)
WBC: 5.8 10*3/uL (ref 3.4–10.8)

## 2013-06-22 LAB — HEPATIC FUNCTION PANEL
ALT: 6 IU/L (ref 0–32)
AST: 7 IU/L (ref 0–40)
Albumin: 4.6 g/dL (ref 3.5–5.5)
Alkaline Phosphatase: 37 IU/L — ABNORMAL LOW (ref 39–117)
Bilirubin, Direct: 0.1 mg/dL (ref 0.00–0.40)
Total Bilirubin: 0.3 mg/dL (ref 0.0–1.2)
Total Protein: 6.5 g/dL (ref 6.0–8.5)

## 2013-06-26 ENCOUNTER — Ambulatory Visit: Payer: Medicaid Other | Attending: Orthopedic Surgery | Admitting: Physical Therapy

## 2013-06-26 DIAGNOSIS — M6281 Muscle weakness (generalized): Secondary | ICD-10-CM | POA: Insufficient documentation

## 2013-06-26 DIAGNOSIS — R5381 Other malaise: Secondary | ICD-10-CM | POA: Insufficient documentation

## 2013-06-26 DIAGNOSIS — IMO0001 Reserved for inherently not codable concepts without codable children: Secondary | ICD-10-CM | POA: Insufficient documentation

## 2013-06-26 DIAGNOSIS — M25519 Pain in unspecified shoulder: Secondary | ICD-10-CM | POA: Insufficient documentation

## 2013-08-08 ENCOUNTER — Encounter (HOSPITAL_COMMUNITY): Payer: Self-pay | Admitting: Emergency Medicine

## 2013-08-08 ENCOUNTER — Emergency Department (HOSPITAL_COMMUNITY)
Admission: EM | Admit: 2013-08-08 | Discharge: 2013-08-08 | Disposition: A | Discharge status: Home / Self Care | Payer: Medicaid Other | Attending: Emergency Medicine | Admitting: Emergency Medicine

## 2013-08-08 ENCOUNTER — Other Ambulatory Visit (INDEPENDENT_AMBULATORY_CARE_PROVIDER_SITE_OTHER): Payer: Medicaid Other

## 2013-08-08 DIAGNOSIS — Z8679 Personal history of other diseases of the circulatory system: Secondary | ICD-10-CM | POA: Insufficient documentation

## 2013-08-08 DIAGNOSIS — Z79899 Other long term (current) drug therapy: Secondary | ICD-10-CM | POA: Insufficient documentation

## 2013-08-08 DIAGNOSIS — R51 Headache: Secondary | ICD-10-CM | POA: Insufficient documentation

## 2013-08-08 DIAGNOSIS — Z3202 Encounter for pregnancy test, result negative: Secondary | ICD-10-CM | POA: Insufficient documentation

## 2013-08-08 DIAGNOSIS — F3289 Other specified depressive episodes: Secondary | ICD-10-CM | POA: Insufficient documentation

## 2013-08-08 DIAGNOSIS — Z8739 Personal history of other diseases of the musculoskeletal system and connective tissue: Secondary | ICD-10-CM | POA: Insufficient documentation

## 2013-08-08 DIAGNOSIS — M329 Systemic lupus erythematosus, unspecified: Secondary | ICD-10-CM

## 2013-08-08 DIAGNOSIS — Z880 Allergy status to penicillin: Secondary | ICD-10-CM | POA: Insufficient documentation

## 2013-08-08 DIAGNOSIS — R109 Unspecified abdominal pain: Secondary | ICD-10-CM

## 2013-08-08 DIAGNOSIS — Z87891 Personal history of nicotine dependence: Secondary | ICD-10-CM | POA: Insufficient documentation

## 2013-08-08 DIAGNOSIS — Z87828 Personal history of other (healed) physical injury and trauma: Secondary | ICD-10-CM | POA: Insufficient documentation

## 2013-08-08 DIAGNOSIS — K219 Gastro-esophageal reflux disease without esophagitis: Secondary | ICD-10-CM | POA: Insufficient documentation

## 2013-08-08 DIAGNOSIS — Z859 Personal history of malignant neoplasm, unspecified: Secondary | ICD-10-CM | POA: Insufficient documentation

## 2013-08-08 DIAGNOSIS — R110 Nausea: Secondary | ICD-10-CM | POA: Insufficient documentation

## 2013-08-08 DIAGNOSIS — Z8719 Personal history of other diseases of the digestive system: Secondary | ICD-10-CM | POA: Insufficient documentation

## 2013-08-08 DIAGNOSIS — Z792 Long term (current) use of antibiotics: Secondary | ICD-10-CM | POA: Insufficient documentation

## 2013-08-08 DIAGNOSIS — IMO0001 Reserved for inherently not codable concepts without codable children: Secondary | ICD-10-CM | POA: Insufficient documentation

## 2013-08-08 DIAGNOSIS — F329 Major depressive disorder, single episode, unspecified: Secondary | ICD-10-CM | POA: Insufficient documentation

## 2013-08-08 DIAGNOSIS — IMO0002 Reserved for concepts with insufficient information to code with codable children: Secondary | ICD-10-CM | POA: Insufficient documentation

## 2013-08-08 DIAGNOSIS — F411 Generalized anxiety disorder: Secondary | ICD-10-CM | POA: Insufficient documentation

## 2013-08-08 LAB — CBC WITH DIFFERENTIAL/PLATELET
Basophils Absolute: 0 10*3/uL (ref 0.0–0.1)
Eosinophils Relative: 1 % (ref 0–5)
HCT: 36 % (ref 36.0–46.0)
Lymphocytes Relative: 49 % — ABNORMAL HIGH (ref 12–46)
Lymphs Abs: 1.9 10*3/uL (ref 0.7–4.0)
MCV: 90.2 fL (ref 78.0–100.0)
Monocytes Absolute: 0.3 10*3/uL (ref 0.1–1.0)
Neutro Abs: 1.6 10*3/uL — ABNORMAL LOW (ref 1.7–7.7)
Platelets: 277 10*3/uL (ref 150–400)
RBC: 3.99 MIL/uL (ref 3.87–5.11)
RDW: 12 % (ref 11.5–15.5)
WBC: 3.8 10*3/uL — ABNORMAL LOW (ref 4.0–10.5)

## 2013-08-08 LAB — BASIC METABOLIC PANEL
CO2: 30 mEq/L (ref 19–32)
Calcium: 9 mg/dL (ref 8.4–10.5)
Chloride: 100 mEq/L (ref 96–112)
Glucose, Bld: 83 mg/dL (ref 70–99)
Sodium: 136 mEq/L (ref 135–145)

## 2013-08-08 LAB — URINALYSIS, ROUTINE W REFLEX MICROSCOPIC
Bilirubin Urine: NEGATIVE
Glucose, UA: NEGATIVE mg/dL
Hgb urine dipstick: NEGATIVE
Specific Gravity, Urine: 1.008 (ref 1.005–1.030)
pH: 7 (ref 5.0–8.0)

## 2013-08-08 LAB — POCT PREGNANCY, URINE: Preg Test, Ur: NEGATIVE

## 2013-08-08 NOTE — ED Notes (Signed)
Pt c/o of bilateral flank pain x3 days. States that she has been having difficulty urinating . Denies blood in urine, n/v/d, fever.

## 2013-08-08 NOTE — ED Provider Notes (Signed)
Medical screening examination/treatment/procedure(s) were performed by non-physician practitioner and as supervising physician I was immediately available for consultation/collaboration.   Benny Lennert, MD 08/08/13 [id number]

## 2013-08-08 NOTE — ED Provider Notes (Signed)
CSN: [id number]     Arrival date & time 08/08/13  1554 History   First MD Initiated Contact with Patient 08/08/13 1557     No chief complaint on file.  (Consider location/radiation/quality/duration/timing/severity/associated sxs/prior Treatment) Patient is a 47 y.o. female presenting with flank pain.  Flank Pain This is a new problem. The current episode started 1 to 4 weeks ago. The problem occurs constantly. The problem has been gradually worsening. Associated symptoms include headaches, myalgias, nausea and urinary symptoms. She has tried acetaminophen, oral narcotics and NSAIDs for the symptoms. The treatment provided mild relief.    Past Medical History  Diagnosis Date  . Irritable bowel syndrome (IBS)   . Abdominal pain   . Fusion of spine Jan 2011    L4-S1 fusion  . Depression   . Anxiety   . Cancer     MOLE PRE  . GERD (gastroesophageal reflux disease)   . Headache(784.0)     SINUS  . Arthritis   . Cerebrospinal fluid leak from spinal puncture 08/24/2012  . Raynaud's disease    Past Surgical History  Procedure Laterality Date  . Spinal fusion  jan 2011    L4-S1 fusion  . Abdominal hernia repair    . Ablation of spine    . Cholecystectomy    . Uterine ablation    . Abdominal hysterectomy  8/12  . Tonsillectomy    . Fracture surgery    . Cesarean section    . Shoulder surgery Right   . Esophagogastroduodenoscopy N/A 05/08/2013    Procedure: ESOPHAGOGASTRODUODENOSCOPY (EGD);  Surgeon: Malissa Hippo, MD;  Location: [facility];  Service: Endoscopy;  Laterality: N/A;  315   Family History  Problem Relation Age of Onset  . Hyperlipidemia Mother   . Depression Mother   . Anxiety disorder Mother    History  Substance Use Topics  . Smoking status: Former Smoker -- 0.50 packs/day    Quit date: 11/13/2008  . Smokeless tobacco: Not on file  . Alcohol Use: 0.6 oz/week    1 Cans of beer per week     Comment: 1 beer occasionally   OB History   Grav Para Term  Preterm Abortions TAB SAB Ect Mult Living                 Review of Systems  Gastrointestinal: Positive for nausea.  Genitourinary: Positive for flank pain.  Musculoskeletal: Positive for myalgias.  Neurological: Positive for headaches.  All other systems reviewed and are negative.    Allergies  Penicillins  Home Medications   Current Outpatient Rx  Name  Route  Sig  Dispense  Refill  . azaTHIOprine (IMURAN) 50 MG tablet   Oral   Take 50 mg by mouth daily.         Marland Kitchen HYDROcodone-acetaminophen (NORCO) 10-325 MG per tablet   Oral   Take 1 tablet by mouth every 6 (six) hours as needed for pain. Per Dr. Lovell Sheehan         . hydroxychloroquine (PLAQUENIL) 200 MG tablet   Oral   Take by mouth 2 (two) times daily.         Marland Kitchen ALPRAZolam (XANAX) 0.5 MG tablet   Oral   Take 0.5 mg by mouth 3 (three) times daily as needed for sleep or anxiety. Per Dr. Hurley Cisco         . azithromycin (ZITHROMAX) 250 MG tablet      Take 2 po first days and then one po  qd x 4days   6 tablet   0   . BIOTIN 5000 PO   Oral   Take 1 tablet by mouth daily.          . Brimonidine Tartrate (MIRVASO) 0.33 % GEL   Apply externally   Apply 1 application topically as needed (face break outs).         . cyanocobalamin 100 MCG tablet   Oral   Take 100 mcg by mouth daily.         Marland Kitchen doxycycline (VIBRA-TABS) 100 MG tablet   Oral   Take 1 tablet (100 mg total) by mouth 2 (two) times daily.   20 tablet   0   . felodipine (PLENDIL) 5 MG 24 hr tablet   Oral   Take 5 mg by mouth daily.         . fish oil-omega-3 fatty acids 1000 MG capsule   Oral   Take 1 g by mouth daily.         Marland Kitchen gabapentin (NEURONTIN) 300 MG capsule   Oral   Take 900 mg by mouth at bedtime.          . Misc Natural Products (LAXATIVE FORMULA PO)   Oral   Take 1 tablet by mouth daily as needed. For constipation         . mupirocin ointment (BACTROBAN) 2 %   Topical   Apply 1 application topically 2 (two)  times daily.         Marland Kitchen omeprazole (PRILOSEC) 40 MG capsule      EVERY DAY   30 capsule   5   . ondansetron (ZOFRAN ODT) 8 MG disintegrating tablet   Oral   Take 1 tablet (8 mg total) by mouth every 8 (eight) hours as needed for nausea.   20 tablet   0   . oxyCODONE-acetaminophen (PERCOCET) 10-325 MG per tablet   Oral   Take 1 tablet by mouth daily. Dr. Lovell Sheehan         . predniSONE (DELTASONE) 10 MG tablet   Oral   Take 30 mg by mouth 4 (four) times daily. 10 mg in morning 5 mg at lunch 5 mg at dinner and 10 mg at bedtime         . predniSONE (DELTASONE) 20 MG tablet   Oral   Take 2 tablets (40 mg total) by mouth daily. Take 40 mg by mouth daily for 3 days, then 20mg  by mouth daily for 3 days, then 10mg  daily for 3 days   12 tablet   0   . sucralfate (CARAFATE) 1 GM/10ML suspension   Oral   Take 10 mLs (1 g total) by mouth 4 (four) times daily.   420 mL   1   . Tetrahydrozoline HCl (EYE DROPS OP)   Both Eyes   Place 2 drops into both eyes as needed (dry eyes).         Marland Kitchen VITAMIN E PO   Oral   Take 1 tablet by mouth daily.         . ziprasidone (GEODON) 80 MG capsule   Oral   Take 80 mg by mouth 2 (two) times daily with a meal.          BP 126/82  Pulse 93  Temp(Src) 97.8 F (36.6 C) (Oral)  Resp 16  SpO2 100% Physical Exam  Nursing note and vitals reviewed. Constitutional: She is oriented to person, place, and time. She appears  well-developed and well-nourished.  HENT:  Head: Normocephalic.  Eyes: Pupils are equal, round, and reactive to light.  Neck: Normal range of motion.  Cardiovascular: Normal rate and regular rhythm.   Pulmonary/Chest: Effort normal and breath sounds normal.  Abdominal: Soft. Bowel sounds are normal. There is CVA tenderness.  Musculoskeletal: She exhibits no edema.  Lymphadenopathy:    She has no cervical adenopathy.  Neurological: She is alert and oriented to person, place, and time.  Skin: Skin is warm and dry.   Psychiatric: She has a normal mood and affect. Her behavior is normal. Judgment and thought content normal.    ED Course  Procedures (including critical care time) Labs Review Labs Reviewed  URINALYSIS, ROUTINE W REFLEX MICROSCOPIC   Imaging Review No results found.  EKG Interpretation   None      Patient with chronic pain d/t SLE and RA.  Reports persistent new right flank pain for several weeks, now with pain in left flank as well.  Reports urinary frequency, nausea without emesis.    Labs reviewed and shared with patient.  No leukocytosis.  Normal urinalysis. Kidney function normal.  Patient discussed with Dr. Estell Harpin.  ? sequelae of SLE.  Patient to follow-up with her PCP. MDM  SLE. Bilateral flank pain.     Jimmye Norman, NP 08/08/13 [phone]

## 2013-08-08 NOTE — Progress Notes (Signed)
Pt came for labs only per Dr.O'ROURKE

## 2013-08-09 LAB — URINE CULTURE: Colony Count: NO GROWTH

## 2013-08-09 LAB — CBC WITH DIFFERENTIAL
Basophils Absolute: 0 10*3/uL (ref 0.0–0.2)
Basos: 0 %
Eos: 2 %
Eosinophils Absolute: 0.1 10*3/uL (ref 0.0–0.4)
HCT: 37.9 % (ref 34.0–46.6)
Hemoglobin: 13 g/dL (ref 11.1–15.9)
Immature Grans (Abs): 0 10*3/uL (ref 0.0–0.1)
Immature Granulocytes: 0 %
Lymphocytes Absolute: 1.2 10*3/uL (ref 0.7–3.1)
Lymphs: 44 %
MCH: 31.6 pg (ref 26.6–33.0)
MCHC: 34.3 g/dL (ref 31.5–35.7)
MCV: 92 fL (ref 79–97)
Monocytes Absolute: 0.4 10*3/uL (ref 0.1–0.9)
Monocytes: 13 %
Neutrophils Absolute: 1.1 10*3/uL — ABNORMAL LOW (ref 1.4–7.0)
Neutrophils Relative %: 41 %
Platelets: 284 10*3/uL (ref 150–379)
RBC: 4.12 x10E6/uL (ref 3.77–5.28)
RDW: 13.5 % (ref 12.3–15.4)
WBC: 2.8 10*3/uL — ABNORMAL LOW (ref 3.4–10.8)

## 2013-08-09 LAB — HEPATIC FUNCTION PANEL
ALT: 25 IU/L (ref 0–32)
AST: 27 IU/L (ref 0–40)
Albumin: 4.5 g/dL (ref 3.5–5.5)
Alkaline Phosphatase: 60 IU/L (ref 39–117)
Bilirubin, Direct: 0.08 mg/dL (ref 0.00–0.40)
Total Bilirubin: 0.3 mg/dL (ref 0.0–1.2)
Total Protein: 6.4 g/dL (ref 6.0–8.5)

## 2013-08-15 ENCOUNTER — Telehealth: Payer: Self-pay | Admitting: Family Medicine

## 2013-08-15 NOTE — Telephone Encounter (Signed)
Pt notified of lab results and April in lab will fax results to Dr Carolan Clines at [facility]

## 2013-08-29 ENCOUNTER — Other Ambulatory Visit: Payer: Self-pay

## 2013-08-30 ENCOUNTER — Other Ambulatory Visit (INDEPENDENT_AMBULATORY_CARE_PROVIDER_SITE_OTHER): Payer: Self-pay

## 2013-08-30 DIAGNOSIS — R7989 Other specified abnormal findings of blood chemistry: Secondary | ICD-10-CM

## 2013-08-30 NOTE — Progress Notes (Signed)
Patient came in for labs only from Dr.Orourke

## 2013-08-31 LAB — CBC WITH DIFFERENTIAL
Basophils Absolute: 0 10*3/uL (ref 0.0–0.2)
Basos: 0 %
Eos: 1 %
Eosinophils Absolute: 0 10*3/uL (ref 0.0–0.4)
HCT: 38.3 % (ref 34.0–46.6)
Hemoglobin: 13.1 g/dL (ref 11.1–15.9)
Immature Grans (Abs): 0 10*3/uL (ref 0.0–0.1)
Immature Granulocytes: 0 %
Lymphocytes Absolute: 0.7 10*3/uL (ref 0.7–3.1)
Lymphs: 21 %
MCH: 32 pg (ref 26.6–33.0)
MCHC: 34.2 g/dL (ref 31.5–35.7)
MCV: 93 fL (ref 79–97)
Monocytes Absolute: 0.2 10*3/uL (ref 0.1–0.9)
Monocytes: 5 %
Neutrophils Absolute: 2.4 10*3/uL (ref 1.4–7.0)
Neutrophils Relative %: 73 %
Platelets: 284 10*3/uL (ref 150–379)
RBC: 4.1 x10E6/uL (ref 3.77–5.28)
RDW: 14.1 % (ref 12.3–15.4)
WBC: 3.3 10*3/uL — ABNORMAL LOW (ref 3.4–10.8)

## 2013-09-02 ENCOUNTER — Emergency Department (HOSPITAL_BASED_OUTPATIENT_CLINIC_OR_DEPARTMENT_OTHER): Payer: Medicaid Other

## 2013-09-02 ENCOUNTER — Encounter (HOSPITAL_BASED_OUTPATIENT_CLINIC_OR_DEPARTMENT_OTHER): Payer: Self-pay | Admitting: Emergency Medicine

## 2013-09-02 ENCOUNTER — Emergency Department (HOSPITAL_BASED_OUTPATIENT_CLINIC_OR_DEPARTMENT_OTHER)
Admission: EM | Admit: 2013-09-02 | Discharge: 2013-09-02 | Disposition: A | Discharge status: Home / Self Care | Payer: Medicaid Other | Attending: Emergency Medicine | Admitting: Emergency Medicine

## 2013-09-02 DIAGNOSIS — F411 Generalized anxiety disorder: Secondary | ICD-10-CM | POA: Insufficient documentation

## 2013-09-02 DIAGNOSIS — Z79899 Other long term (current) drug therapy: Secondary | ICD-10-CM | POA: Insufficient documentation

## 2013-09-02 DIAGNOSIS — R0602 Shortness of breath: Secondary | ICD-10-CM | POA: Insufficient documentation

## 2013-09-02 DIAGNOSIS — R0789 Other chest pain: Secondary | ICD-10-CM | POA: Diagnosis present

## 2013-09-02 DIAGNOSIS — R220 Localized swelling, mass and lump, head: Secondary | ICD-10-CM | POA: Insufficient documentation

## 2013-09-02 DIAGNOSIS — F3289 Other specified depressive episodes: Secondary | ICD-10-CM | POA: Insufficient documentation

## 2013-09-02 DIAGNOSIS — M129 Arthropathy, unspecified: Secondary | ICD-10-CM | POA: Insufficient documentation

## 2013-09-02 DIAGNOSIS — Z8679 Personal history of other diseases of the circulatory system: Secondary | ICD-10-CM | POA: Insufficient documentation

## 2013-09-02 DIAGNOSIS — K219 Gastro-esophageal reflux disease without esophagitis: Secondary | ICD-10-CM | POA: Insufficient documentation

## 2013-09-02 DIAGNOSIS — R42 Dizziness and giddiness: Secondary | ICD-10-CM | POA: Insufficient documentation

## 2013-09-02 DIAGNOSIS — Z9889 Other specified postprocedural states: Secondary | ICD-10-CM | POA: Insufficient documentation

## 2013-09-02 DIAGNOSIS — G8929 Other chronic pain: Secondary | ICD-10-CM | POA: Insufficient documentation

## 2013-09-02 DIAGNOSIS — F329 Major depressive disorder, single episode, unspecified: Secondary | ICD-10-CM | POA: Insufficient documentation

## 2013-09-02 DIAGNOSIS — Z85828 Personal history of other malignant neoplasm of skin: Secondary | ICD-10-CM | POA: Insufficient documentation

## 2013-09-02 DIAGNOSIS — Z87891 Personal history of nicotine dependence: Secondary | ICD-10-CM | POA: Insufficient documentation

## 2013-09-02 DIAGNOSIS — Z880 Allergy status to penicillin: Secondary | ICD-10-CM | POA: Insufficient documentation

## 2013-09-02 HISTORY — DX: Reserved for concepts with insufficient information to code with codable children: IMO0002

## 2013-09-02 HISTORY — DX: Systemic lupus erythematosus, unspecified: M32.9

## 2013-09-02 LAB — CBC WITH DIFFERENTIAL/PLATELET
Basophils Absolute: 0 10*3/uL (ref 0.0–0.1)
Basophils Relative: 0 % (ref 0–1)
Eosinophils Absolute: 0 10*3/uL (ref 0.0–0.7)
HCT: 35 % — ABNORMAL LOW (ref 36.0–46.0)
Hemoglobin: 12.2 g/dL (ref 12.0–15.0)
MCH: 32 pg (ref 26.0–34.0)
MCHC: 34.9 g/dL (ref 30.0–36.0)
Monocytes Absolute: 0.3 10*3/uL (ref 0.1–1.0)
Monocytes Relative: 6 % (ref 3–12)
Neutrophils Relative %: 73 % (ref 43–77)

## 2013-09-02 LAB — COMPREHENSIVE METABOLIC PANEL
AST: 12 U/L (ref 0–37)
Albumin: 3.9 g/dL (ref 3.5–5.2)
BUN: 11 mg/dL (ref 6–23)
Chloride: 100 mEq/L (ref 96–112)
Creatinine, Ser: 0.6 mg/dL (ref 0.50–1.10)
GFR calc Af Amer: >90 mL/min (ref >90)
Glucose, Bld: 127 mg/dL — ABNORMAL HIGH (ref 70–99)
Potassium: 4.3 mEq/L (ref 3.5–5.1)
Total Bilirubin: 0.2 mg/dL — ABNORMAL LOW (ref 0.3–1.2)
Total Protein: 6.6 g/dL (ref 6.0–8.3)

## 2013-09-02 LAB — TROPONIN I: Troponin I: <0.3 ng/mL (ref <0.30)

## 2013-09-02 MED ORDER — KETOROLAC TROMETHAMINE 30 MG/ML IJ SOLN
30.0000 mg | Freq: Once | INTRAMUSCULAR | Status: AC
  Administered 2013-09-02: 30 mg via INTRAVENOUS
  Filled 2013-09-02: qty 1

## 2013-09-02 NOTE — ED Provider Notes (Signed)
CSN: [id number]     Arrival date & time 09/02/13  1505 History  This chart was scribed for Junius Argyle, MD by Blanchard Kelch, ED Scribe. The patient was seen in room MH12/MH12. Patient's care was started at 3:33 PM.    Chief Complaint  Patient presents with  . Back Pain    Patient is a 47 y.o. female presenting with chest pain. The history is provided by the patient. No language interpreter was used.  Chest Pain Pain location:  L chest Pain quality: aching, sharp and tightness   Pain radiates to:  Upper back Pain radiates to the back: yes   Duration:  12 months Timing:  Intermittent Progression:  Worsening Associated symptoms: back pain, dizziness and shortness of breath   Associated symptoms: no fever and not vomiting     HPI Comments: Maureen [name] is a 47 y.o. female with a history of Lupus who presents to the Emergency Department complaining of intermittent left sided chest pain that began a year ago but worsened three days ago. She gets multiple episodes a day, about one an hour. The episodes last for about a minute and the pain is described as sharp. She also gets an aching, tight pain that last a few minutes as well. She is currently experiencing pain that she rates as a 5/10 in severity. The pain has radiated to her upper back since three days ago, which is a new experience. She denies noticing any factors that bring on the pain. She states that she experiences shortness of breath, dizziness and chest tightness when she gets the sharp pain in her chest. She has had similar episodes of pain in the past due to anxiety, which she has a history of. Her jaw has been swollen for two weeks and she is currently on Prednisone for it. She denies fever, vomiting, diarrhea or cough. She denies history of blood clots, cancer, recent surgery, hormonal birth control use, or coughing up blood. She denies a family history of heart problems or a medical history of hypertension, diabetes or  hyperlipidemia. She has a past surgical history of a hysterectomy, two spinal fusions and a right shoulder surgery. She is prescribed Hydrocodone for chronic back pain and last took one about three hours ago. She is also prescribed Xanax for anxiety and is currently taking it. She is a former smoker and denies alcohol use.    Past Medical History  Diagnosis Date  . Irritable bowel syndrome (IBS)   . Abdominal pain   . Fusion of spine Jan 2011    L4-S1 fusion  . Depression   . Anxiety   . Cancer     MOLE PRE  . GERD (gastroesophageal reflux disease)   . Headache(784.0)     SINUS  . Arthritis   . Cerebrospinal fluid leak from spinal puncture 08/24/2012  . Raynaud's disease   . Lupus    Past Surgical History  Procedure Laterality Date  . Spinal fusion  jan 2011    L4-S1 fusion  . Abdominal hernia repair    . Ablation of spine    . Cholecystectomy    . Uterine ablation    . Abdominal hysterectomy  8/12  . Tonsillectomy    . Fracture surgery    . Cesarean section    . Shoulder surgery Right   . Esophagogastroduodenoscopy N/A 05/08/2013    Procedure: ESOPHAGOGASTRODUODENOSCOPY (EGD);  Surgeon: Malissa Hippo, MD;  Location: [facility];  Service: Endoscopy;  Laterality:  N/A;  315   Family History  Problem Relation Age of Onset  . Hyperlipidemia Mother   . Depression Mother   . Anxiety disorder Mother    History  Substance Use Topics  . Smoking status: Former Smoker -- 0.50 packs/day    Quit date: 11/13/2008  . Smokeless tobacco: Not on file  . Alcohol Use: 0.6 oz/week    1 Cans of beer per week     Comment: 1 beer occasionally   OB History   Grav Para Term Preterm Abortions TAB SAB Ect Mult Living                 Review of Systems  Constitutional: Negative for fever.  HENT: Negative for drooling.   Eyes: Negative for discharge.  Respiratory: Positive for chest tightness and shortness of breath. Negative for choking.   Cardiovascular: Positive for chest pain.   Gastrointestinal: Negative for vomiting.  Endocrine: Negative for polyuria.  Genitourinary: Negative for hematuria.  Musculoskeletal: Positive for back pain.  Skin: Negative for rash.  Allergic/Immunologic: Negative for immunocompromised state.  Neurological: Positive for dizziness. Negative for speech difficulty.  Hematological: Negative for adenopathy.  Psychiatric/Behavioral: Negative for confusion.  All other systems reviewed and are negative.    Allergies  Penicillins  Home Medications   Current Outpatient Rx  Name  Route  Sig  Dispense  Refill  . ALPRAZolam (XANAX) 0.5 MG tablet   Oral   Take 0.5 mg by mouth 3 (three) times daily as needed for sleep or anxiety. Per Dr. Hurley Cisco         . azaTHIOprine (IMURAN) 50 MG tablet   Oral   Take 50 mg by mouth daily.         Marland Kitchen BIOTIN 5000 PO   Oral   Take 1 tablet by mouth daily.          . cyanocobalamin 100 MCG tablet   Oral   Take 100 mcg by mouth daily.         Marland Kitchen gabapentin (NEURONTIN) 400 MG capsule   Oral   Take 800 mg by mouth every evening.         Marland Kitchen HYDROcodone-acetaminophen (NORCO) 10-325 MG per tablet   Oral   Take 1 tablet by mouth every 6 (six) hours as needed for pain. Per Dr. Lovell Sheehan         . hydroxychloroquine (PLAQUENIL) 200 MG tablet   Oral   Take by mouth 2 (two) times daily.         Marland Kitchen lamoTRIgine (LAMICTAL) 25 MG tablet   Oral   Take 75 mg by mouth every evening.         Marland Kitchen omeprazole (PRILOSEC) 40 MG capsule   Oral   Take 40 mg by mouth daily.         Marland Kitchen oxyCODONE-acetaminophen (PERCOCET) 10-325 MG per tablet   Oral   Take 1 tablet by mouth every evening. Dr. Lovell Sheehan          Triage Vitals: BP 134/71  Pulse 74  Temp(Src) 97.9 F (36.6 C) (Oral)  SpO2 100%  Physical Exam  Nursing note and vitals reviewed. Constitutional: She is oriented to person, place, and time. She appears well-developed and well-nourished.  HENT:  Head: Normocephalic and atraumatic.  Eyes:  EOM are normal. Pupils are equal, round, and reactive to light.  Neck: Normal range of motion. Neck supple.  Cardiovascular: Normal rate, normal heart sounds and intact distal pulses.   Pulmonary/Chest: Effort  normal and breath sounds normal.  Abdominal: Bowel sounds are normal. She exhibits no distension. There is no tenderness.  Musculoskeletal: Normal range of motion. She exhibits no edema and no tenderness.  Normal symmetric appearing lower extremities without tenderness to palpation.  Neurological: She is alert and oriented to person, place, and time. She has normal strength. No cranial nerve deficit or sensory deficit.  Skin: Skin is warm and dry. No rash noted.  Psychiatric: She has a normal mood and affect.    ED Course  Procedures (including critical care time)  DIAGNOSTIC STUDIES: Oxygen Saturation is 100% on room air, normal by my interpretation.    COORDINATION OF CARE: 3:52 PM -Will order Toradol injection, Chest x-ray, CBC, CMP and Troponin I. Patient verbalizes understanding and agrees with treatment plan.    Labs Review Labs Reviewed  CBC WITH DIFFERENTIAL - Abnormal; Notable for the following:    RBC 3.81 (*)    HCT 35.0 (*)    All other components within normal limits  COMPREHENSIVE METABOLIC PANEL - Abnormal; Notable for the following:    Glucose, Bld 127 (*)    Total Bilirubin 0.2 (*)    All other components within normal limits  TROPONIN I   Imaging Review Dg Chest 2 View  09/02/2013   CLINICAL DATA:  Shortness of breath. Pain.  EXAM: CHEST  2 VIEW  COMPARISON:  Chest x-ray 10/28/2009.  FINDINGS: Mediastinum and hilar structures are normal. Lungs are clear. No pneumothorax or pleural effusion. Heart size normal. Surgical clips right upper quadrant consistent with prior cholecystectomy. Punctate well-circumscribed lucency right proximal humerus, unchanged from prior study of 2011, most likely from prior surgery. No other focal bony abnormality identified.   IMPRESSION: No active cardiopulmonary disease.   Electronically Signed   By: Maisie Fus  Register   On: 09/02/2013 16:47    EKG Interpretation     Ventricular Rate:  73 PR Interval:  186 QRS Duration: 86 QT Interval:  378 QTC Calculation: 416 R Axis:   72 Text Interpretation:  Normal sinus rhythm Normal ECG No previous tracing            MDM   1. Atypical chest pain    4:04 PM 47 y.o. female w/ hx of lupus who pw intermittent cp radiating to upper back, sob, and dizziness for the last 3 days. The patient notes that she has had similar symptoms in the past and has attributed them to anxiety. She states that she has had the symptoms intermittently for approximately one year. She does note that the radiation to the upper back is new. She notes mild chest tightness on exam currently. She was a previous smoker but has no family history of heart disease and denies any history of hypertension, diabetes, or high cholesterol. She is Wells/PERC neg for PE.  The symptoms last only seconds to minutes at a time and then resolve which is atypical for aortic dissection. Will get screening labs, chest x-ray, EKG, and Toradol for pain.   5:29 PM: I interpreted/reviewed the labs and/or imaging which were non-contributory. Pt feeling mildly better. Low risk for MACE per HEART score. I suspect a component of anxiety to her sx.  I have discussed the diagnosis/risks/treatment options with the patient and believe the pt to be eligible for discharge home to follow-up with her pcp in the next 1-2 days to discuss further workup. We also discussed returning to the ED immediately if new or worsening sx occur. We discussed the sx which  are most concerning (e.g., worsening cp, sob, fever) that necessitate immediate return. Any new prescriptions provided to the patient are listed below.  New Prescriptions   No medications on file      I personally performed the services described in this documentation, which was  scribed in my presence. The recorded information has been reviewed and is accurate.    Junius Argyle, MD 09/02/13 [phone]

## 2013-09-02 NOTE — ED Notes (Signed)
States has had intermittent chest and back pain  X 1 month   Hx of lupus

## 2013-09-02 NOTE — ED Notes (Signed)
Pt c/o chest pain that is substernal at times and left sided at times. Pain is intermittent and radiates to left shoulder and back at times. Pt is unable to give any aggravating factors other than stress.

## 2013-09-02 NOTE — ED Notes (Signed)
Patient transported to X-ray 

## 2013-09-03 ENCOUNTER — Telehealth: Payer: Self-pay | Admitting: Family Medicine

## 2013-09-03 NOTE — Telephone Encounter (Signed)
Pt notified of results Labs sent to Dr Vanessa Kick

## 2013-09-03 NOTE — Telephone Encounter (Signed)
Pt notified of results

## 2013-09-03 NOTE — Telephone Encounter (Signed)
Message copied by Bearl Mulberry on Tue Sep 03, 2013  6:06 PM ------      Message from: Deatra Canter      Created: Mon Sep 02, 2013  8:41 AM       Wbc's 3.3 and stable ------

## 2013-09-17 DIAGNOSIS — M961 Postlaminectomy syndrome, not elsewhere classified: Secondary | ICD-10-CM | POA: Insufficient documentation

## 2013-10-02 ENCOUNTER — Other Ambulatory Visit (INDEPENDENT_AMBULATORY_CARE_PROVIDER_SITE_OTHER): Payer: Medicaid Other

## 2013-10-02 DIAGNOSIS — M329 Systemic lupus erythematosus, unspecified: Secondary | ICD-10-CM

## 2013-10-02 NOTE — Progress Notes (Unsigned)
Fax to Dr Vanessa Kick

## 2013-10-03 ENCOUNTER — Telehealth: Payer: Self-pay | Admitting: Family Medicine

## 2013-10-03 LAB — CBC WITH DIFFERENTIAL
Basophils Absolute: 0 10*3/uL (ref 0.0–0.2)
Basos: 1 %
Eos: 5 %
Eosinophils Absolute: 0.2 10*3/uL (ref 0.0–0.4)
HCT: 36.6 % (ref 34.0–46.6)
Hemoglobin: 12.9 g/dL (ref 11.1–15.9)
Immature Grans (Abs): 0 10*3/uL (ref 0.0–0.1)
Immature Granulocytes: 0 %
Lymphocytes Absolute: 1.3 10*3/uL (ref 0.7–3.1)
Lymphs: 37 %
MCH: 32.4 pg (ref 26.6–33.0)
MCHC: 35.2 g/dL (ref 31.5–35.7)
MCV: 92 fL (ref 79–97)
Monocytes Absolute: 0.4 10*3/uL (ref 0.1–0.9)
Monocytes: 11 %
Neutrophils Absolute: 1.6 10*3/uL (ref 1.4–7.0)
Neutrophils Relative %: 46 %
Platelets: 263 10*3/uL (ref 150–379)
RBC: 3.98 x10E6/uL (ref 3.77–5.28)
RDW: 13.8 % (ref 12.3–15.4)
WBC: 3.5 10*3/uL (ref 3.4–10.8)

## 2013-10-03 LAB — HEPATIC FUNCTION PANEL
ALT: 22 IU/L (ref 0–32)
AST: 17 IU/L (ref 0–40)
Albumin: 4.5 g/dL (ref 3.5–5.5)
Alkaline Phosphatase: 41 IU/L (ref 39–117)
Bilirubin, Direct: 0.09 mg/dL (ref 0.00–0.40)
Total Bilirubin: 0.3 mg/dL (ref 0.0–1.2)
Total Protein: 6.4 g/dL (ref 6.0–8.5)

## 2013-10-04 ENCOUNTER — Ambulatory Visit (INDEPENDENT_AMBULATORY_CARE_PROVIDER_SITE_OTHER): Payer: Medicaid Other | Admitting: Family Medicine

## 2013-10-04 ENCOUNTER — Encounter: Payer: Self-pay | Admitting: Family Medicine

## 2013-10-04 VITALS — BP 106/72 | HR 73 | Temp 97.8°F | Ht 64.0 in | Wt 152.6 lb

## 2013-10-04 DIAGNOSIS — R52 Pain, unspecified: Secondary | ICD-10-CM

## 2013-10-04 DIAGNOSIS — J029 Acute pharyngitis, unspecified: Secondary | ICD-10-CM

## 2013-10-04 LAB — POCT INFLUENZA A/B
Influenza A, POC: NEGATIVE
Influenza B, POC: NEGATIVE

## 2013-10-04 LAB — POCT RAPID STREP A (OFFICE): Rapid Strep A Screen: NEGATIVE

## 2013-10-04 NOTE — Progress Notes (Signed)
   Subjective:    Patient ID: Rande Brunt, female    DOB: [birth year], 47 y.o.   MRN: [id number]  HPI This 47 y.o. female presents for evaluation of Nausea and sore throat.   Review of Systems C/o sore throat No chest pain, SOB, HA, dizziness, vision change, N/V, diarrhea, constipation, dysuria, urinary urgency or frequency, myalgias, arthralgias or rash.     Objective:   Physical Exam  Vital signs noted  Well developed well nourished female.  HEENT - Head atraumatic Normocephalic                Eyes - PERRLA, Conjuctiva - clear Sclera- Clear EOMI                Ears - EAC's Wnl TM's Wnl Gross Hearing WNL                Nose - Nares patent                 Throat - oropharanx injected Respiratory - Lungs CTA bilateral Cardiac - RRR S1 and S2 without murmur GI - Abdomen soft Nontender and bowel sounds active x 4 Extremities - No edema. Neuro - Grossly intact.  Results for orders placed in visit on 10/04/13  POCT INFLUENZA A/B      Result Value Range   Influenza A, POC Negative     Influenza B, POC Negative    POCT RAPID STREP A (OFFICE)      Result Value Range   Rapid Strep A Screen Negative  Negative      Assessment & Plan:  Body aches - Plan: POCT Influenza A/B, POCT rapid strep A.  Zpak as directed.  Sore throat - Plan: POCT Influenza A/B, POCT rapid strep A.  Zpak as directed.  Push po fluids, rest, tylenol and motrin otc prn as directed for fever, arthralgias, and myalgias.  Follow up prn if sx's continue or persist.  Deatra Canter FNP

## 2013-10-04 NOTE — Telephone Encounter (Signed)
Pt notified of results Verbalizes understanding 

## 2013-10-09 DIAGNOSIS — M5417 Radiculopathy, lumbosacral region: Secondary | ICD-10-CM | POA: Insufficient documentation

## 2013-11-19 ENCOUNTER — Ambulatory Visit (INDEPENDENT_AMBULATORY_CARE_PROVIDER_SITE_OTHER): Payer: Medicaid Other | Admitting: Family Medicine

## 2013-11-19 ENCOUNTER — Ambulatory Visit (INDEPENDENT_AMBULATORY_CARE_PROVIDER_SITE_OTHER): Payer: Medicaid Other

## 2013-11-19 ENCOUNTER — Encounter: Payer: Self-pay | Admitting: Family Medicine

## 2013-11-19 VITALS — BP 117/73 | HR 72 | Temp 97.4°F | Ht 64.0 in | Wt 153.0 lb

## 2013-11-19 DIAGNOSIS — M549 Dorsalgia, unspecified: Secondary | ICD-10-CM

## 2013-11-19 MED ORDER — METHOCARBAMOL 500 MG PO TABS: 500.0000 mg | ORAL_TABLET | Freq: Four times a day (QID) | ORAL | Status: DC

## 2013-11-19 MED ORDER — METHYLPREDNISOLONE (PAK) 4 MG PO TABS: ORAL_TABLET | ORAL | Status: DC

## 2013-11-19 MED ORDER — KETOROLAC TROMETHAMINE 30 MG/ML IJ SOLN
30.0000 mg | Freq: Once | INTRAMUSCULAR | Status: AC
  Administered 2013-11-19: 30 mg via INTRAMUSCULAR

## 2013-11-19 NOTE — Progress Notes (Signed)
   Subjective:    Patient ID: Maureen [name], female    DOB: Nov 10, 1965, 48 y.o.   MRN: [id number]  HPI This 48 y.o. female presents for evaluation of severe back pain radiating into her legs.  She has Hx of DDD LS spine and lumbar fusion.  She fell and is worried she may have damaged the Hardware in her back.   Review of Systems No chest pain, SOB, HA, dizziness, vision change, N/V, diarrhea, constipation, dysuria, urinary urgency or frequency, myalgias, arthralgias or rash.     Objective:   Physical Exam Vital signs noted  Well developed well nourished female.  HEENT - Head atraumatic Normocephalic                Eyes - PERRLA, Conjuctiva - clear Sclera- Clear EOMI                Ears - EAC's Wnl TM's Wnl Gross Hearing WNL Respiratory - Lungs CTA bilateral Cardiac - RRR S1 and S2 without murmur GI - Abdomen soft Nontender and bowel sounds active x4 Neuro - Decreased strength with dorsi and plantar flexion bilateral. MS - TTP bilateral LS muscles  Xray - LS spine with hardware/ fixation device and screws which appear to be intact. Prelimnary reading by Maureen [name]    Assessment & Plan:  Back pain - Plan: DG Lumbar Spine 2-3 Views, methylPREDNIsolone (MEDROL DOSPACK) 4 MG tablet, methocarbamol (ROBAXIN) 500 MG tablet, ketorolac (TORADOL) 30 MG/ML injection 30 mg  Lysbeth Penner FNP

## 2013-11-19 NOTE — Patient Instructions (Signed)
Lumbosacral Strain Lumbosacral strain is a strain of any of the parts that make up your lumbosacral vertebrae. Your lumbosacral vertebrae are the bones that make up the lower third of your backbone. Your lumbosacral vertebrae are held together by muscles and tough, fibrous tissue (ligaments).  CAUSES  A sudden blow to your back can cause lumbosacral strain. Also, anything that causes an excessive stretch of the muscles in the low back can cause this strain. This is typically seen when people exert themselves strenuously, fall, lift heavy objects, bend, or crouch repeatedly. RISK FACTORS  Physically demanding work.  Participation in pushing or pulling sports or sports that require sudden twist of the back (tennis, golf, baseball).  Weight lifting.  Excessive lower back curvature.  Forward-tilted pelvis.  Weak back or abdominal muscles or both.  Tight hamstrings. SIGNS AND SYMPTOMS  Lumbosacral strain may cause pain in the area of your injury or pain that moves (radiates) down your leg.  DIAGNOSIS Your health care provider can often diagnose lumbosacral strain through a physical exam. In some cases, you may need tests such as X-ray exams.  TREATMENT  Treatment for your lower back injury depends on many factors that your clinician will have to evaluate. However, most treatment will include the use of anti-inflammatory medicines. HOME CARE INSTRUCTIONS   Avoid hard physical activities (tennis, racquetball, waterskiing) if you are not in proper physical condition for it. This may aggravate or create problems.  If you have a back problem, avoid sports requiring sudden body movements. Swimming and walking are generally safer activities.  Maintain good posture.  Maintain a healthy weight.  For acute conditions, you may put ice on the injured area.  Put ice in a plastic bag.  Place a towel between your skin and the bag.  Leave the ice on for 20 minutes, 2 3 times a day.  When the  low back starts healing, stretching and strengthening exercises may be recommended. SEEK MEDICAL CARE IF:  Your back pain is getting worse.  You experience severe back pain not relieved with medicines. SEEK IMMEDIATE MEDICAL CARE IF:   You have numbness, tingling, weakness, or problems with the use of your arms or legs.  There is a change in bowel or bladder control.  You have increasing pain in any area of the body, including your belly (abdomen).  You notice shortness of breath, dizziness, or feel faint.  You feel sick to your stomach (nauseous), are throwing up (vomiting), or become sweaty.  You notice discoloration of your toes or legs, or your feet get very cold. MAKE SURE YOU:   Understand these instructions.  Will watch your condition.  Will get help right away if you are not doing well or get worse. Document Released: 07/20/2005 Document Revised: 07/31/2013 Document Reviewed: 05/29/2013 [employer] Patient Information 2014 [employer].  

## 2013-12-23 ENCOUNTER — Telehealth: Payer: Self-pay | Admitting: Family Medicine

## 2013-12-23 ENCOUNTER — Encounter: Payer: Self-pay | Admitting: Family Medicine

## 2013-12-23 ENCOUNTER — Other Ambulatory Visit: Payer: Medicaid Other

## 2013-12-23 ENCOUNTER — Ambulatory Visit (INDEPENDENT_AMBULATORY_CARE_PROVIDER_SITE_OTHER): Payer: Medicaid Other | Admitting: Family Medicine

## 2013-12-23 VITALS — BP 121/80 | HR 71 | Temp 97.4°F | Ht 64.0 in | Wt 154.0 lb

## 2013-12-23 DIAGNOSIS — M069 Rheumatoid arthritis, unspecified: Secondary | ICD-10-CM

## 2013-12-23 DIAGNOSIS — J329 Chronic sinusitis, unspecified: Secondary | ICD-10-CM

## 2013-12-23 LAB — POCT CBC
Granulocyte percent: 76.3 %G (ref 37–80)
HCT, POC: 40.8 % (ref 37.7–47.9)
Hemoglobin: 13.6 g/dL (ref 12.2–16.2)
Lymph, poc: 1 (ref 0.6–3.4)
MCH, POC: 32 pg — AB (ref 27–31.2)
MCHC: 33.4 g/dL (ref 31.8–35.4)
MCV: 95.7 fL (ref 80–97)
MPV: 6.3 fL (ref 0–99.8)
POC Granulocyte: 3.9 (ref 2–6.9)
Platelet Count, POC: 303 10*3/uL (ref 142–424)
RBC: 4.3 M/uL (ref 4.04–5.48)
RDW, POC: 12.9 %
WBC: 5.1 10*3/uL (ref 4.6–10.2)

## 2013-12-23 MED ORDER — TRIAMCINOLONE ACETONIDE 40 MG/ML IJ SUSP
60.0000 mg | Freq: Once | INTRAMUSCULAR | Status: AC
  Administered 2013-12-23: 60 mg via INTRAMUSCULAR

## 2013-12-23 MED ORDER — LEVOFLOXACIN 500 MG PO TABS: 500.0000 mg | ORAL_TABLET | Freq: Every day | ORAL | Status: DC

## 2013-12-23 NOTE — Telephone Encounter (Signed)
Appt today at 3:30 with Rush Landmark

## 2013-12-23 NOTE — Progress Notes (Signed)
   Subjective:    Patient ID: [name], female    DOB: 07/29/1966, 48 y.o.   MRN: [id number]  HPI This 48 y.o. female presents for evaluation of facial discomfort, sinus discharge, and uri sx's for over a week.  She needs cmp and LFT drawn for RA.   Review of Systems No chest pain, SOB, HA, dizziness, vision change, N/V, diarrhea, constipation, dysuria, urinary urgency or frequency, myalgias, arthralgias or rash.     Objective:   Physical Exam Vital signs noted  Well developed well nourished female.  HEENT - Head atraumatic Normocephalic                Eyes - PERRLA, Conjuctiva - clear Sclera- Clear EOMI                Ears - EAC's Wnl TM's Wnl Gross Hearing WNL                Nose - Nares patent                 Throat - oropharanx wnl Respiratory - Lungs CTA bilateral Cardiac - RRR S1 and S2 without murmur GI - Abdomen soft Nontender and bowel sounds active x 4       Assessment & Plan:  Sinusitis - Plan: levofloxacin (LEVAQUIN) 500 MG tablet, triamcinolone acetonide (KENALOG-40) injection 60 mg  RA (rheumatoid arthritis) - Plan: POCT CBC, Hepatic function panel  Lysbeth Penner FNP

## 2013-12-24 LAB — HEPATIC FUNCTION PANEL
ALT: 14 IU/L (ref 0–32)
AST: 13 IU/L (ref 0–40)
Albumin: 4.5 g/dL (ref 3.5–5.5)
Alkaline Phosphatase: 44 IU/L (ref 39–117)
Bilirubin, Direct: 0.08 mg/dL (ref 0.00–0.40)
Total Bilirubin: <0.2 mg/dL (ref 0.0–1.2)
Total Protein: 6.4 g/dL (ref 6.0–8.5)

## 2013-12-27 ENCOUNTER — Emergency Department (HOSPITAL_BASED_OUTPATIENT_CLINIC_OR_DEPARTMENT_OTHER)
Admission: EM | Admit: 2013-12-27 | Discharge: 2013-12-27 | Disposition: A | Discharge status: Home / Self Care | Payer: Medicaid Other | Attending: Emergency Medicine | Admitting: Emergency Medicine

## 2013-12-27 ENCOUNTER — Telehealth: Payer: Self-pay | Admitting: Family Medicine

## 2013-12-27 ENCOUNTER — Emergency Department (HOSPITAL_BASED_OUTPATIENT_CLINIC_OR_DEPARTMENT_OTHER): Payer: Medicaid Other

## 2013-12-27 ENCOUNTER — Encounter (HOSPITAL_BASED_OUTPATIENT_CLINIC_OR_DEPARTMENT_OTHER): Payer: Self-pay | Admitting: Emergency Medicine

## 2013-12-27 DIAGNOSIS — Z87891 Personal history of nicotine dependence: Secondary | ICD-10-CM | POA: Insufficient documentation

## 2013-12-27 DIAGNOSIS — R110 Nausea: Secondary | ICD-10-CM | POA: Insufficient documentation

## 2013-12-27 DIAGNOSIS — F3289 Other specified depressive episodes: Secondary | ICD-10-CM | POA: Insufficient documentation

## 2013-12-27 DIAGNOSIS — Z85828 Personal history of other malignant neoplasm of skin: Secondary | ICD-10-CM | POA: Insufficient documentation

## 2013-12-27 DIAGNOSIS — Z792 Long term (current) use of antibiotics: Secondary | ICD-10-CM | POA: Insufficient documentation

## 2013-12-27 DIAGNOSIS — Z79899 Other long term (current) drug therapy: Secondary | ICD-10-CM | POA: Insufficient documentation

## 2013-12-27 DIAGNOSIS — M129 Arthropathy, unspecified: Secondary | ICD-10-CM | POA: Insufficient documentation

## 2013-12-27 DIAGNOSIS — F411 Generalized anxiety disorder: Secondary | ICD-10-CM | POA: Insufficient documentation

## 2013-12-27 DIAGNOSIS — Z880 Allergy status to penicillin: Secondary | ICD-10-CM | POA: Insufficient documentation

## 2013-12-27 DIAGNOSIS — K219 Gastro-esophageal reflux disease without esophagitis: Secondary | ICD-10-CM | POA: Insufficient documentation

## 2013-12-27 DIAGNOSIS — F329 Major depressive disorder, single episode, unspecified: Secondary | ICD-10-CM | POA: Insufficient documentation

## 2013-12-27 DIAGNOSIS — K5900 Constipation, unspecified: Secondary | ICD-10-CM

## 2013-12-27 DIAGNOSIS — Z8679 Personal history of other diseases of the circulatory system: Secondary | ICD-10-CM | POA: Insufficient documentation

## 2013-12-27 LAB — CBC WITH DIFFERENTIAL/PLATELET
BASOS ABS: 0 10*3/uL (ref 0.0–0.1)
BASOS PCT: 0 % (ref 0–1)
EOS ABS: 0 10*3/uL (ref 0.0–0.7)
Eosinophils Relative: 0 % (ref 0–5)
HCT: 39.8 % (ref 36.0–46.0)
Hemoglobin: 13.8 g/dL (ref 12.0–15.0)
LYMPHS ABS: 0.8 10*3/uL (ref 0.7–4.0)
Lymphocytes Relative: 16 % (ref 12–46)
MCH: 32.6 pg (ref 26.0–34.0)
MCHC: 34.7 g/dL (ref 30.0–36.0)
MCV: 94.1 fL (ref 78.0–100.0)
Monocytes Absolute: 0.3 10*3/uL (ref 0.1–1.0)
Monocytes Relative: 6 % (ref 3–12)
Neutro Abs: 3.8 10*3/uL (ref 1.7–7.7)
Neutrophils Relative %: 77 % (ref 43–77)
PLATELETS: 279 10*3/uL (ref 150–400)
RBC: 4.23 MIL/uL (ref 3.87–5.11)
RDW: 12.1 % (ref 11.5–15.5)
WBC: 4.9 10*3/uL (ref 4.0–10.5)

## 2013-12-27 LAB — COMPREHENSIVE METABOLIC PANEL
ALBUMIN: 3.9 g/dL (ref 3.5–5.2)
ALT: 17 U/L (ref 0–35)
AST: 14 U/L (ref 0–37)
Alkaline Phosphatase: 42 U/L (ref 39–117)
BUN: 9 mg/dL (ref 6–23)
CALCIUM: 9.4 mg/dL (ref 8.4–10.5)
CO2: 30 mEq/L (ref 19–32)
Chloride: 100 mEq/L (ref 96–112)
Creatinine, Ser: 0.6 mg/dL (ref 0.50–1.10)
GFR calc non Af Amer: >90 mL/min (ref >90)
GLUCOSE: 141 mg/dL — AB (ref 70–99)
Potassium: 4.4 mEq/L (ref 3.7–5.3)
SODIUM: 140 meq/L (ref 137–147)
TOTAL PROTEIN: 6.9 g/dL (ref 6.0–8.3)
Total Bilirubin: 0.3 mg/dL (ref 0.3–1.2)

## 2013-12-27 LAB — URINALYSIS, ROUTINE W REFLEX MICROSCOPIC
BILIRUBIN URINE: NEGATIVE
Glucose, UA: NEGATIVE mg/dL
Hgb urine dipstick: NEGATIVE
Ketones, ur: NEGATIVE mg/dL
LEUKOCYTES UA: NEGATIVE
NITRITE: NEGATIVE
PH: 7 (ref 5.0–8.0)
Protein, ur: NEGATIVE mg/dL
SPECIFIC GRAVITY, URINE: 1.003 — AB (ref 1.005–1.030)
Urobilinogen, UA: 0.2 mg/dL (ref 0.0–1.0)

## 2013-12-27 MED ORDER — POLYETHYLENE GLYCOL 3350 17 G PO PACK
17.0000 g | PACK | Freq: Two times a day (BID) | ORAL | Status: AC
Start: 2013-12-27 — End: 2013-12-30

## 2013-12-27 MED ORDER — BISACODYL 5 MG PO TBEC: 5.0000 mg | DELAYED_RELEASE_TABLET | Freq: Two times a day (BID) | ORAL | Status: DC

## 2013-12-27 MED ORDER — PROMETHAZINE HCL 25 MG PO TABS: 25.0000 mg | ORAL_TABLET | Freq: Four times a day (QID) | ORAL | Status: DC | PRN

## 2013-12-27 MED ORDER — FLEET ENEMA 7-19 GM/118ML RE ENEM: 1.0000 | ENEMA | Freq: Every day | RECTAL | Status: AC | PRN

## 2013-12-27 NOTE — ED Notes (Signed)
C/o constipation-last normal BM 2 weeks ago-has tried enema, dulcolax, miralax, increased fiber and water w/o success

## 2013-12-27 NOTE — Telephone Encounter (Signed)
Labs reviewed and faxed

## 2013-12-27 NOTE — ED Notes (Signed)
Chart reviewed and care assumed. 

## 2013-12-27 NOTE — Telephone Encounter (Signed)
Patient aware.

## 2013-12-27 NOTE — Telephone Encounter (Signed)
Please review labs for patient so we can go over them and fax.

## 2013-12-27 NOTE — Telephone Encounter (Signed)
Labs back and ok and fax to Dr. Tempie Donning at [facility]

## 2013-12-27 NOTE — Discharge Instructions (Signed)

## 2013-12-27 NOTE — ED Notes (Signed)
Patient transported to X-ray 

## 2013-12-27 NOTE — ED Provider Notes (Signed)
CSN: [id number]     Arrival date & time 12/27/13  1417 History   First MD Initiated Contact with Patient 12/27/13 1510     Chief Complaint  Patient presents with  . Constipation     (Consider location/radiation/quality/duration/timing/severity/associated sxs/prior Treatment) Patient is a 48 y.o. female presenting with constipation.  Constipation Severity:  Moderate Time since last bowel movement: 1.5 weeks. Timing:  Constant Progression:  Unchanged Chronicity:  New Context comment:  "lupus flare", taking prednisone, small increase in typical pain med usage Stool description:  None produced Relieved by:  Nothing Ineffective treatments: stool softeners, miralax, enemas. Associated symptoms: abdominal pain and nausea   Associated symptoms: no diarrhea, no dysuria, no fever and no vomiting     Past Medical History  Diagnosis Date  . Irritable bowel syndrome (IBS)   . Abdominal pain   . Fusion of spine Jan 2011    L4-S1 fusion  . Depression   . Anxiety   . Cancer     MOLE PRE  . GERD (gastroesophageal reflux disease)   . Headache(784.0)     SINUS  . Arthritis   . Cerebrospinal fluid leak from spinal puncture 08/24/2012  . Raynaud's disease   . Lupus    Past Surgical History  Procedure Laterality Date  . Spinal fusion  jan 2011    L4-S1 fusion  . Abdominal hernia repair    . Ablation of spine    . Cholecystectomy    . Uterine ablation    . Abdominal hysterectomy  8/12  . Tonsillectomy    . Fracture surgery    . Cesarean section    . Shoulder surgery Right   . Esophagogastroduodenoscopy N/A 05/08/2013    Procedure: ESOPHAGOGASTRODUODENOSCOPY (EGD);  Surgeon: Rogene Houston, MD;  Location: [facility];  Service: Endoscopy;  Laterality: N/A;  315   Family History  Problem Relation Age of Onset  . Hyperlipidemia Mother   . Depression Mother   . Anxiety disorder Mother    History  Substance Use Topics  . Smoking status: Former Smoker -- 0.50 packs/day    Quit  date: 11/13/2008  . Smokeless tobacco: Not on file  . Alcohol Use: 0.6 oz/week    1 Cans of beer per week     Comment: 1 beer occasionally   OB History   Grav Para Term Preterm Abortions TAB SAB Ect Mult Living                 Review of Systems  Constitutional: Negative for fever.  HENT: Negative for congestion.   Respiratory: Negative for cough and shortness of breath.   Cardiovascular: Negative for chest pain.  Gastrointestinal: Positive for nausea, abdominal pain and constipation. Negative for vomiting and diarrhea.  Genitourinary: Negative for dysuria.  All other systems reviewed and are negative.      Allergies  Carbamazepine and Penicillins  Home Medications   Current Outpatient Rx  Name  Route  Sig  Dispense  Refill  . ALPRAZolam (XANAX) 0.5 MG tablet   Oral   Take 0.5 mg by mouth 2 (two) times daily as needed for anxiety or sleep. Per Dr. Jonita Albee         . azaTHIOprine (IMURAN) 50 MG tablet   Oral   Take 100 mg by mouth daily.          Marland Kitchen BIOTIN 5000 PO   Oral   Take 1 tablet by mouth daily.          Marland Kitchen  bisacodyl (STIMULANT LAXATIVE) 5 MG EC tablet      5 mg.         . cyanocobalamin 100 MCG tablet   Oral   Take 100 mcg by mouth daily.         . fexofenadine (ALLEGRA) 180 MG tablet      180 mg.         . Flaxseed, Linseed, (FLAXSEED OIL) 1000 MG CAPS      2 capsules.         Marland Kitchen HYDROcodone-acetaminophen (NORCO) 10-325 MG per tablet   Oral   Take 1 tablet by mouth every 6 (six) hours as needed for pain. Per Dr. Arnoldo Morale         . hydroxychloroquine (PLAQUENIL) 200 MG tablet   Oral   Take by mouth 2 (two) times daily.         Marland Kitchen lamoTRIgine (LAMICTAL) 25 MG tablet   Oral   Take 75 mg by mouth every evening.         . levETIRAcetam (KEPPRA) 250 MG tablet   Oral   Take 250 mg by mouth 2 (two) times daily.         Marland Kitchen levofloxacin (LEVAQUIN) 500 MG tablet   Oral   Take 1 tablet (500 mg total) by mouth daily.   14 tablet    0   . methocarbamol (ROBAXIN) 500 MG tablet   Oral   Take 1 tablet (500 mg total) by mouth 4 (four) times daily.   40 tablet   3   . methylPREDNIsolone (MEDROL DOSPACK) 4 MG tablet      follow package directions   21 tablet   0   . omeprazole (PRILOSEC) 40 MG capsule   Oral   Take 40 mg by mouth daily.         Marland Kitchen oxyCODONE-acetaminophen (PERCOCET) 10-325 MG per tablet   Oral   Take 1 tablet by mouth every evening. Dr. Arnoldo Morale         . predniSONE (DELTASONE) 5 MG tablet      3 po QAM x 5 days, then deduce QAM by half-tab every 5 days until off         . thalidomide (THALOMID) 50 MG capsule   Oral   Take 100 mg by mouth at bedtime. Take with water.          BP 152/87  Pulse 98  Temp(Src) 98.4 F (36.9 C) (Oral)  Resp 20  Ht 5\' 3"  (1.6 m)  Wt 150 lb (68.04 kg)  BMI 26.58 kg/m2  SpO2 100% Physical Exam  Nursing note and vitals reviewed. Constitutional: She is oriented to person, place, and time. She appears well-developed and well-nourished. No distress.  HENT:  Head: Normocephalic and atraumatic.  Mouth/Throat: Oropharynx is clear and moist.  Eyes: Conjunctivae are normal. Pupils are equal, round, and reactive to light. No scleral icterus.  Neck: Neck supple.  Cardiovascular: Normal rate, regular rhythm, normal heart sounds and intact distal pulses.   No murmur heard. Pulmonary/Chest: Effort normal and breath sounds normal. No stridor. No respiratory distress. She has no rales.  Abdominal: Soft. Bowel sounds are normal. She exhibits no distension. There is tenderness in the left lower quadrant. There is no rigidity, no rebound, no guarding and no CVA tenderness.  Musculoskeletal: Normal range of motion.  Neurological: She is alert and oriented to person, place, and time.  Skin: Skin is warm and dry. No rash noted.  Psychiatric: She has  a normal mood and affect. Her behavior is normal.    ED Course  Procedures (including critical care time) Labs  Review Labs Reviewed  COMPREHENSIVE METABOLIC PANEL - Abnormal; Notable for the following:    Glucose, Bld 141 (*)    All other components within normal limits  URINALYSIS, ROUTINE W REFLEX MICROSCOPIC - Abnormal; Notable for the following:    Specific Gravity, Urine 1.003 (*)    All other components within normal limits  CBC WITH DIFFERENTIAL   Imaging Review Dg Abd Acute W/chest  12/27/2013   CLINICAL DATA:  Abdominal pain. Constipation. It irritable bowel syndrome.  EXAM: ACUTE ABDOMEN SERIES (ABDOMEN 2 VIEW & CHEST 1 VIEW)  COMPARISON:  DG CHEST 2V dated 12/14/2013; DG LUMBAR SPINE COMPLETE 4+V dated 04/17/2012; DG CHEST 2 VIEW dated 09/02/2013; DG CHEST 2V dated 03/07/2006; DG CHEST 2 VIEW dated 03/26/2008; DG CHEST 1V PORT dated 10/26/2009  FINDINGS: Sharply defined 5 mm lucent lesion in the right proximal humerus with a tiny punctate central calcific density. No change from 2011.  The lungs appear clear.  Cardiac and mediastinal contours normal.  No pleural effusion identified.  No free intraperitoneal gas beneath the hemidiaphragms. Prominent stool throughout the colon favors constipation. No dilated small bowel. Lower lumbar fixation hardware noted.  IMPRESSION: 1.  Prominent stool throughout the colon favors constipation. 2. Small lucent lesion in the right proximal humerus, no change over the past 4 years, considered benign and incidental.   Electronically Signed   By: Sherryl Barters M.D.   On: 12/27/2013 15:51  All radiology studies independently viewed by me.      EKG Interpretation None      MDM   Final diagnoses:  Constipation    48 yo female presenting with CC of constipation.  Present for 1.5 weeks.  States she has been having a Lupus flare.  Symptoms could be secondary to increased narcotic use.  Abd soft, LLQ tenderness.  I doubt bowel obstruction, but she has had multiple abdominal surgeries.    AAS is highly suspicious for constipation.  No evidence of SBO.  I don't think  she needs a CT based on low suspicion, reassuring exam, and plain film findings.  She is tolerating PO intake without difficulty.  Have given her return precautions and recommended follow up.    Houston Siren III, MD 12/27/13 [phone]

## 2014-01-27 ENCOUNTER — Encounter (HOSPITAL_COMMUNITY): Payer: Self-pay | Admitting: Pharmacy Technician

## 2014-01-29 ENCOUNTER — Other Ambulatory Visit: Payer: Self-pay | Admitting: Anesthesiology

## 2014-01-29 DIAGNOSIS — M797 Fibromyalgia: Secondary | ICD-10-CM | POA: Insufficient documentation

## 2014-01-30 ENCOUNTER — Encounter (HOSPITAL_COMMUNITY): Payer: Self-pay

## 2014-01-30 ENCOUNTER — Encounter (HOSPITAL_COMMUNITY)
Admission: RE | Admit: 2014-01-30 | Discharge: 2014-01-30 | Disposition: A | Discharge status: Home / Self Care | Payer: Medicaid Other | Source: Ambulatory Visit | Attending: Anesthesiology | Admitting: Anesthesiology

## 2014-01-30 DIAGNOSIS — Z01812 Encounter for preprocedural laboratory examination: Secondary | ICD-10-CM | POA: Insufficient documentation

## 2014-01-30 HISTORY — DX: Unspecified osteoarthritis, unspecified site: M19.90

## 2014-01-30 HISTORY — DX: Urgency of urination: R39.15

## 2014-01-30 HISTORY — DX: Frequency of micturition: R35.0

## 2014-01-30 HISTORY — DX: Fibromyalgia: M79.7

## 2014-01-30 HISTORY — DX: Paresthesia of skin: R20.0

## 2014-01-30 HISTORY — DX: Other chronic pain: G89.29

## 2014-01-30 HISTORY — DX: Effusion, unspecified joint: M25.40

## 2014-01-30 HISTORY — DX: Pneumonia, unspecified organism: J18.9

## 2014-01-30 HISTORY — DX: Dorsalgia, unspecified: M54.9

## 2014-01-30 HISTORY — DX: Other hemorrhoids: K64.8

## 2014-01-30 HISTORY — DX: Pain in unspecified joint: M25.50

## 2014-01-30 HISTORY — DX: Interstitial cystitis (chronic) without hematuria: N30.10

## 2014-01-30 HISTORY — DX: Other muscle spasm: M62.838

## 2014-01-30 HISTORY — DX: Personal history of other diseases of the respiratory system: Z87.09

## 2014-01-30 HISTORY — DX: Constipation, unspecified: K59.00

## 2014-01-30 HISTORY — DX: Cardiac murmur, unspecified: R01.1

## 2014-01-30 HISTORY — DX: Anesthesia of skin: R20.2

## 2014-01-30 LAB — CBC WITH DIFFERENTIAL/PLATELET
Basophils Absolute: 0.1 10*3/uL (ref 0.0–0.1)
Basophils Relative: 2 % — ABNORMAL HIGH (ref 0–1)
Eosinophils Absolute: 0.2 10*3/uL (ref 0.0–0.7)
Eosinophils Relative: 4 % (ref 0–5)
HCT: 37.7 % (ref 36.0–46.0)
Hemoglobin: 13 g/dL (ref 12.0–15.0)
Lymphocytes Relative: 38 % (ref 12–46)
Lymphs Abs: 1.9 10*3/uL (ref 0.7–4.0)
MCH: 32.7 pg (ref 26.0–34.0)
MCHC: 34.5 g/dL (ref 30.0–36.0)
MCV: 95 fL (ref 78.0–100.0)
Monocytes Absolute: 0.7 10*3/uL (ref 0.1–1.0)
Monocytes Relative: 14 % — ABNORMAL HIGH (ref 3–12)
Neutro Abs: 2.1 10*3/uL (ref 1.7–7.7)
Neutrophils Relative %: 42 % — ABNORMAL LOW (ref 43–77)
Platelets: 248 10*3/uL (ref 150–400)
RBC: 3.97 MIL/uL (ref 3.87–5.11)
RDW: 13.5 % (ref 11.5–15.5)
WBC: 4.9 10*3/uL (ref 4.0–10.5)

## 2014-01-30 LAB — SURGICAL PCR SCREEN
MRSA, PCR: NEGATIVE
Staphylococcus aureus: NEGATIVE

## 2014-01-30 LAB — URINALYSIS, ROUTINE W REFLEX MICROSCOPIC
Bilirubin Urine: NEGATIVE
Glucose, UA: NEGATIVE mg/dL
Hgb urine dipstick: NEGATIVE
Ketones, ur: NEGATIVE mg/dL
Leukocytes, UA: NEGATIVE
Nitrite: NEGATIVE
Protein, ur: NEGATIVE mg/dL
Specific Gravity, Urine: 1.009 (ref 1.005–1.030)
Urobilinogen, UA: 0.2 mg/dL (ref 0.0–1.0)
pH: 6 (ref 5.0–8.0)

## 2014-01-30 LAB — APTT: aPTT: 28 seconds (ref 24–37)

## 2014-01-30 LAB — PROTIME-INR
INR: 0.99 (ref 0.00–1.49)
Prothrombin Time: 12.9 seconds (ref 11.6–15.2)

## 2014-01-30 LAB — BASIC METABOLIC PANEL
BUN: 10 mg/dL (ref 6–23)
CO2: 30 mEq/L (ref 19–32)
Calcium: 8.9 mg/dL (ref 8.4–10.5)
Chloride: 98 mEq/L (ref 96–112)
Creatinine, Ser: 0.64 mg/dL (ref 0.50–1.10)
GFR calc Af Amer: >90 mL/min (ref >90)
GFR calc non Af Amer: >90 mL/min (ref >90)
Glucose, Bld: 66 mg/dL — ABNORMAL LOW (ref 70–99)
Potassium: 4.2 mEq/L (ref 3.7–5.3)
Sodium: 139 mEq/L (ref 137–147)

## 2014-01-30 NOTE — Pre-Procedure Instructions (Signed)
TONJIA PARILLO  01/30/2014   Your procedure is scheduled on:  Fri, April 17 @ 7:30 AM  Report to [facility]  At 5:30  AM.  Call this number if you have problems the morning of surgery: [phone]   Remember:   Do not eat food or drink liquids after midnight.   Take these medicines the morning of surgery with A SIP OF WATER: Xanax(Alprazolam),Imuran(Azathioprine),Pain Pill(if needed),Prilosec(Omeprazole),and Phenergan(Promethazine-if needed)              No Goody's,BC's,Aleve,Aspirin,Ibuprofen,Fish Oil,or any Herbal Medications   Do not wear jewelry, make-up or nail polish.  Do not wear lotions, powders, or perfumes. You may wear deodorant.  Do not shave 48 hours prior to surgery.   Do not bring valuables to the hospital.  [facility] is not responsible                  for any belongings or valuables.               Contacts, dentures or bridgework may not be worn into surgery.  Leave suitcase in the car. After surgery it may be brought to your room.  For patients admitted to the hospital, discharge time is determined by your                treatment team.               Patients discharged the day of surgery will not be allowed to drive  home.    Special Instructions:  [facility] - Preparing for Surgery  Before surgery, you can play an important role.  Because skin is not sterile, your skin needs to be as free of germs as possible.  You can reduce the number of germs on you skin by washing with CHG (chlorahexidine gluconate) soap before surgery.  CHG is an antiseptic cleaner which kills germs and bonds with the skin to continue killing germs even after washing.  Please DO NOT use if you have an allergy to CHG or antibacterial soaps.  If your skin becomes reddened/irritated stop using the CHG and inform your nurse when you arrive at Short Stay.  Do not shave (including legs and underarms) for at least 48 hours prior to the first CHG shower.  You may shave your  face.  Please follow these instructions carefully:   1.  Shower with CHG Soap the night before surgery and the                                morning of Surgery.  2.  If you choose to wash your hair, wash your hair first as usual with your       normal shampoo.  3.  After you shampoo, rinse your hair and body thoroughly to remove the                      Shampoo.  4.  Use CHG as you would any other liquid soap.  You can apply chg directly       to the skin and wash gently with scrungie or a clean washcloth.  5.  Apply the CHG Soap to your body ONLY FROM THE NECK DOWN.        Do not use on open wounds or open sores.  Avoid contact with your eyes,       ears, mouth and  genitals (private parts).  Wash genitals (private parts)       with your normal soap.  6.  Wash thoroughly, paying special attention to the area where your surgery        will be performed.  7.  Thoroughly rinse your body with warm water from the neck down.  8.  DO NOT shower/wash with your normal soap after using and rinsing off       the CHG Soap.  9.  Pat yourself dry with a clean towel.            10.  Wear clean pajamas.            11.  Place clean sheets on your bed the night of your first shower and do not        sleep with pets.  Day of Surgery  Do not apply any lotions/deoderants the morning of surgery.  Please wear clean clothes to the hospital/surgery center.     Please read over the following fact sheets that you were given: Pain Booklet, Coughing and Deep Breathing, MRSA Information and Surgical Site Infection Prevention

## 2014-01-30 NOTE — Progress Notes (Addendum)
  Pt doesn't have a cardiologist  Denies ever having an echo/stress test/heart cath  Medical Md is Dr. Redge Gainer  EKG and CXR in epic from 09-02-13

## 2014-02-06 MED ORDER — VANCOMYCIN HCL IN DEXTROSE 1-5 GM/200ML-% IV SOLN: 1000.0000 mg | INTRAVENOUS | Status: DC

## 2014-02-07 ENCOUNTER — Ambulatory Visit (HOSPITAL_COMMUNITY): Admission: RE | Admit: 2014-02-07 | Payer: Medicaid Other | Source: Ambulatory Visit | Admitting: Anesthesiology

## 2014-02-07 ENCOUNTER — Encounter (HOSPITAL_COMMUNITY): Admission: RE | Payer: Self-pay | Source: Ambulatory Visit

## 2014-02-07 SURGERY — INSERTION, SPINAL CORD STIMULATOR, LUMBAR
Anesthesia: Monitor Anesthesia Care

## 2014-02-17 ENCOUNTER — Telehealth: Payer: Self-pay | Admitting: Family Medicine

## 2014-02-17 NOTE — Telephone Encounter (Signed)
Appt given for tomorrow per patients request 

## 2014-02-18 ENCOUNTER — Encounter: Payer: Self-pay | Admitting: Family Medicine

## 2014-02-18 ENCOUNTER — Ambulatory Visit (INDEPENDENT_AMBULATORY_CARE_PROVIDER_SITE_OTHER): Payer: Medicaid Other | Admitting: Family Medicine

## 2014-02-18 VITALS — BP 91/54 | HR 56 | Temp 97.7°F | Ht 64.0 in | Wt 164.8 lb

## 2014-02-18 DIAGNOSIS — K5900 Constipation, unspecified: Secondary | ICD-10-CM

## 2014-02-18 DIAGNOSIS — R110 Nausea: Secondary | ICD-10-CM

## 2014-02-18 DIAGNOSIS — M542 Cervicalgia: Secondary | ICD-10-CM

## 2014-02-18 DIAGNOSIS — G44229 Chronic tension-type headache, not intractable: Secondary | ICD-10-CM

## 2014-02-18 MED ORDER — PROMETHAZINE HCL 25 MG PO TABS: 25.0000 mg | ORAL_TABLET | Freq: Four times a day (QID) | ORAL | Status: DC | PRN

## 2014-02-18 MED ORDER — KETOROLAC TROMETHAMINE 30 MG/ML IJ SOLN: 30.0000 mg | Freq: Once | INTRAMUSCULAR | Status: AC

## 2014-02-18 MED ORDER — MELOXICAM 15 MG PO TABS: 15.0000 mg | ORAL_TABLET | Freq: Every day | ORAL | Status: DC

## 2014-02-18 MED ORDER — METHOCARBAMOL 500 MG PO TABS: 500.0000 mg | ORAL_TABLET | Freq: Four times a day (QID) | ORAL | Status: DC | PRN

## 2014-02-18 NOTE — Progress Notes (Signed)
   Subjective:    Patient ID: Maureen [name], female    DOB: 1966-09-06, 48 y.o.   MRN: [id number]  HPI This 48 y.o. female presents for evaluation of headache and constipation.  She has been having Difficulties with her FMS and lupus and has been having a lot of tension headaches.  She has  Been having difficulties with constipation and her headaches are worse if she has to strain..   Review of Systems No chest pain, SOB, HA, dizziness, vision change, N/V, diarrhea, constipation, dysuria, urinary urgency or frequency, myalgias, arthralgias or rash.     Objective:   Physical Exam  Vital signs noted  Well developed well nourished female.  HEENT - Head atraumatic Normocephalic                Eyes - PERRLA, Conjuctiva - clear Sclera- Clear EOMI                Ears - EAC's Wnl TM's Wnl Gross Hearing WNL                Nose - Nares patent                 Throat - oropharanx wnl Respiratory - Lungs CTA bilateral Cardiac - RRR S1 and S2 without murmur GI - Abdomen soft Nontender and bowel sounds active x 4 Extremities - No edema. Neuro - Grossly intact.      Assessment & Plan:  Cervicalgia - Plan: methocarbamol (ROBAXIN) 500 MG tablet, meloxicam (MOBIC) 15 MG tablet, ketorolac (TORADOL) 30 MG/ML injection 30 mg  Chronic tension headaches - Plan: methocarbamol (ROBAXIN) 500 MG tablet, meloxicam (MOBIC) 15 MG tablet, ketorolac (TORADOL) 30 MG/ML injection 30 mg  Nausea alone - Plan: promethazine (PHENERGAN) 25 MG tablet, DISCONTINUED: promethazine (PHENERGAN) 25 MG tablet  Unspecified constipation - Plan: Linzess 169mcg po qd #28 samples  Lysbeth Penner FNP

## 2014-03-07 ENCOUNTER — Encounter (HOSPITAL_COMMUNITY): Payer: Self-pay | Admitting: Emergency Medicine

## 2014-03-07 ENCOUNTER — Emergency Department (HOSPITAL_COMMUNITY)
Admission: EM | Admit: 2014-03-07 | Discharge: 2014-03-07 | Disposition: A | Discharge status: Home / Self Care | Payer: Medicaid Other | Attending: Emergency Medicine | Admitting: Emergency Medicine

## 2014-03-07 DIAGNOSIS — Z880 Allergy status to penicillin: Secondary | ICD-10-CM | POA: Insufficient documentation

## 2014-03-07 DIAGNOSIS — M6281 Muscle weakness (generalized): Secondary | ICD-10-CM | POA: Insufficient documentation

## 2014-03-07 DIAGNOSIS — Z87891 Personal history of nicotine dependence: Secondary | ICD-10-CM | POA: Insufficient documentation

## 2014-03-07 DIAGNOSIS — F329 Major depressive disorder, single episode, unspecified: Secondary | ICD-10-CM | POA: Insufficient documentation

## 2014-03-07 DIAGNOSIS — F3289 Other specified depressive episodes: Secondary | ICD-10-CM | POA: Insufficient documentation

## 2014-03-07 DIAGNOSIS — R011 Cardiac murmur, unspecified: Secondary | ICD-10-CM | POA: Insufficient documentation

## 2014-03-07 DIAGNOSIS — M791 Myalgia, unspecified site: Secondary | ICD-10-CM

## 2014-03-07 DIAGNOSIS — R51 Headache: Secondary | ICD-10-CM | POA: Insufficient documentation

## 2014-03-07 DIAGNOSIS — M1990 Unspecified osteoarthritis, unspecified site: Secondary | ICD-10-CM | POA: Insufficient documentation

## 2014-03-07 DIAGNOSIS — IMO0001 Reserved for inherently not codable concepts without codable children: Secondary | ICD-10-CM | POA: Insufficient documentation

## 2014-03-07 DIAGNOSIS — M329 Systemic lupus erythematosus, unspecified: Secondary | ICD-10-CM | POA: Insufficient documentation

## 2014-03-07 DIAGNOSIS — F411 Generalized anxiety disorder: Secondary | ICD-10-CM | POA: Insufficient documentation

## 2014-03-07 DIAGNOSIS — Z9889 Other specified postprocedural states: Secondary | ICD-10-CM | POA: Insufficient documentation

## 2014-03-07 DIAGNOSIS — G8929 Other chronic pain: Secondary | ICD-10-CM | POA: Insufficient documentation

## 2014-03-07 DIAGNOSIS — K5900 Constipation, unspecified: Secondary | ICD-10-CM | POA: Insufficient documentation

## 2014-03-07 DIAGNOSIS — Z8709 Personal history of other diseases of the respiratory system: Secondary | ICD-10-CM | POA: Insufficient documentation

## 2014-03-07 DIAGNOSIS — M549 Dorsalgia, unspecified: Secondary | ICD-10-CM | POA: Insufficient documentation

## 2014-03-07 DIAGNOSIS — Z8701 Personal history of pneumonia (recurrent): Secondary | ICD-10-CM | POA: Insufficient documentation

## 2014-03-07 DIAGNOSIS — Z8679 Personal history of other diseases of the circulatory system: Secondary | ICD-10-CM | POA: Insufficient documentation

## 2014-03-07 DIAGNOSIS — Z859 Personal history of malignant neoplasm, unspecified: Secondary | ICD-10-CM | POA: Insufficient documentation

## 2014-03-07 DIAGNOSIS — Z87448 Personal history of other diseases of urinary system: Secondary | ICD-10-CM | POA: Insufficient documentation

## 2014-03-07 DIAGNOSIS — Z791 Long term (current) use of non-steroidal anti-inflammatories (NSAID): Secondary | ICD-10-CM | POA: Insufficient documentation

## 2014-03-07 LAB — CBC WITH DIFFERENTIAL/PLATELET
BASOS ABS: 0 10*3/uL (ref 0.0–0.1)
Basophils Relative: 1 % (ref 0–1)
EOS ABS: 0.2 10*3/uL (ref 0.0–0.7)
Eosinophils Relative: 5 % (ref 0–5)
HCT: 38.6 % (ref 36.0–46.0)
Hemoglobin: 13.6 g/dL (ref 12.0–15.0)
Lymphocytes Relative: 48 % — ABNORMAL HIGH (ref 12–46)
Lymphs Abs: 2 10*3/uL (ref 0.7–4.0)
MCH: 32.9 pg (ref 26.0–34.0)
MCHC: 35.2 g/dL (ref 30.0–36.0)
MCV: 93.5 fL (ref 78.0–100.0)
Monocytes Absolute: 0.4 10*3/uL (ref 0.1–1.0)
Monocytes Relative: 11 % (ref 3–12)
Neutro Abs: 1.4 10*3/uL — ABNORMAL LOW (ref 1.7–7.7)
Neutrophils Relative %: 35 % — ABNORMAL LOW (ref 43–77)
PLATELETS: 280 10*3/uL (ref 150–400)
RBC: 4.13 MIL/uL (ref 3.87–5.11)
RDW: 12.7 % (ref 11.5–15.5)
WBC: 4 10*3/uL (ref 4.0–10.5)

## 2014-03-07 LAB — COMPREHENSIVE METABOLIC PANEL
ALBUMIN: 4 g/dL (ref 3.5–5.2)
ALK PHOS: 41 U/L (ref 39–117)
ALT: 25 U/L (ref 0–35)
AST: 21 U/L (ref 0–37)
BUN: 12 mg/dL (ref 6–23)
CO2: 30 mEq/L (ref 19–32)
Calcium: 9.1 mg/dL (ref 8.4–10.5)
Chloride: 99 mEq/L (ref 96–112)
Creatinine, Ser: 0.65 mg/dL (ref 0.50–1.10)
GFR calc Af Amer: >90 mL/min (ref >90)
GFR calc non Af Amer: >90 mL/min (ref >90)
Glucose, Bld: 84 mg/dL (ref 70–99)
Potassium: 4.5 mEq/L (ref 3.7–5.3)
Sodium: 139 mEq/L (ref 137–147)
TOTAL PROTEIN: 6.9 g/dL (ref 6.0–8.3)
Total Bilirubin: 0.2 mg/dL — ABNORMAL LOW (ref 0.3–1.2)

## 2014-03-07 LAB — URINALYSIS, ROUTINE W REFLEX MICROSCOPIC
Bilirubin Urine: NEGATIVE
Glucose, UA: NEGATIVE mg/dL
HGB URINE DIPSTICK: NEGATIVE
Ketones, ur: NEGATIVE mg/dL
Leukocytes, UA: NEGATIVE
Nitrite: NEGATIVE
PH: 6 (ref 5.0–8.0)
Protein, ur: NEGATIVE mg/dL
Urobilinogen, UA: 0.2 mg/dL (ref 0.0–1.0)

## 2014-03-07 LAB — SEDIMENTATION RATE: SED RATE: 0 mm/h (ref 0–22)

## 2014-03-07 MED ORDER — SODIUM CHLORIDE 0.9 % IV BOLUS (SEPSIS)
1000.0000 mL | Freq: Once | INTRAVENOUS | Status: AC
  Administered 2014-03-07: 1000 mL via INTRAVENOUS

## 2014-03-07 MED ORDER — ONDANSETRON HCL 4 MG/2ML IJ SOLN
4.0000 mg | Freq: Once | INTRAMUSCULAR | Status: AC
  Administered 2014-03-07: 4 mg via INTRAVENOUS
  Filled 2014-03-07: qty 2

## 2014-03-07 MED ORDER — KETOROLAC TROMETHAMINE 30 MG/ML IJ SOLN
30.0000 mg | Freq: Once | INTRAMUSCULAR | Status: AC
  Administered 2014-03-07: 30 mg via INTRAVENOUS
  Filled 2014-03-07: qty 1

## 2014-03-07 MED ORDER — METHYLPREDNISOLONE SODIUM SUCC 125 MG IJ SOLR
125.0000 mg | Freq: Once | INTRAMUSCULAR | Status: AC
  Administered 2014-03-07: 125 mg via INTRAVENOUS
  Filled 2014-03-07: qty 2

## 2014-03-07 NOTE — Discharge Instructions (Signed)
Myalgia, Adult Follow up with your rheumatologist. Return to the ED if you develop new or worsening symptoms. Myalgia is the medical term for muscle pain. It is a symptom of many things. Nearly everyone at some time in their life has this. The most common cause for muscle pain is overuse or straining and more so when you are not in shape. Injuries and muscle bruises cause myalgias. Muscle pain without a history of injury can also be caused by a virus. It frequently comes along with the flu. Myalgia not caused by muscle strain can be present in a large number of infectious diseases. Some autoimmune diseases like lupus and fibromyalgia can cause muscle pain. Myalgia may be mild, or severe. SYMPTOMS  The symptoms of myalgia are simply muscle pain. Most of the time this is short lived and the pain goes away without treatment. DIAGNOSIS  Myalgia is diagnosed by your caregiver by taking your history. This means you tell him when the problems began, what they are, and what has been happening. If this has not been a long term problem, your caregiver may want to watch for a while to see what will happen. If it has been long term, they may want to do additional testing. TREATMENT  The treatment depends on what the underlying cause of the muscle pain is. Often anti-inflammatory medications will help. HOME CARE INSTRUCTIONS  If the pain in your muscles came from overuse, slow down your activities until the problems go away.  Myalgia from overuse of a muscle can be treated with alternating hot and cold packs on the muscle affected or with cold for the first couple days. If either heat or cold seems to make things worse, stop their use.  Apply ice to the sore area for 15-20 minutes, 03-04 times per day, while awake for the first 2 days of muscle soreness, or as directed. Put the ice in a plastic bag and place a towel between the bag of ice and your skin.  Only take over-the-counter or prescription medicines for  pain, discomfort, or fever as directed by your caregiver.  Regular gentle exercise may help if you are not active.  Stretching before strenuous exercise can help lower the risk of myalgia. It is normal when beginning an exercise regimen to feel some muscle pain after exercising. Muscles that have not been used frequently will be sore at first. If the pain is extreme, this may mean injury to a muscle. SEEK MEDICAL CARE IF:  You have an increase in muscle pain that is not relieved with medication.  You begin to run a temperature.  You develop nausea and vomiting.  You develop a stiff and painful neck.  You develop a rash.  You develop muscle pain after a tick bite.  You have continued muscle pain while working out even after you are in good condition. SEEK IMMEDIATE MEDICAL CARE IF: Any of your problems are getting worse and medications are not helping. MAKE SURE YOU:   Understand these instructions.  Will watch your condition.  Will get help right away if you are not doing well or get worse. Document Released: 09/01/2006 Document Revised: 01/02/2012 Document Reviewed: 11/21/2006 [facility] Patient Information 2014 [place], [place].

## 2014-03-07 NOTE — ED Provider Notes (Signed)
CSN: [id number]     Arrival date & time 03/07/14  1810 History   First MD Initiated Contact with Patient 03/07/14 2146    This chart was scribed for Ezequiel Essex, MD by Terressa Koyanagi, ED Scribe. This patient was seen in room APA01/APA01 and the patient's care was started at 9:51 PM.  Chief Complaint  Patient presents with  . Joint Pain   PCP: Redge Gainer, MD Rheumatology: [facility]   HPI HPI Comments: Maureen [name] is a 48 y.o. female, with a history of lupus, GERD, who presents to the Emergency Department complaining of joint pain with associated fatigue, HA, worsened back pain (pt reports back pain at baseline), and weakness of the lower extremities. Pt denies having a HA presently. PT reports that she believes she is having a flare up of her lupus. Pt reports that she has had a lot of sun exposure lately and generally sun exposure leads to flare ups. Pt denies fever, chest pain, abd pain, SOB, or weakness of upper extremities. Pt reports she has symptoms like this all the time and generally taking Toradol relieves her symptoms.   Past Medical History  Diagnosis Date  . Abdominal pain   . Cancer     MOLE PRE  . Arthritis   . Cerebrospinal fluid leak from spinal puncture 08/24/2012  . Raynaud's disease   . Lupus     takes Plaquenil daily  . Anxiety     takes Xanax daily as needed  . Constipation     takes Dulcolax and Miralax daily as needed;also Fiber Pills  . GERD (gastroesophageal reflux disease)     takes Omeprazole daily  . Muscle spasm     takes Robaxin daily as needed  . Depression     takes Lamictal daily  . Heart murmur     slight  . History of bronchitis     last time many yrs ago(75yrs ago)  . Pneumonia     hx of > 81yrs ago  . Headache(784.0)   . Numbness and tingling in hands   . Fibromyalgia   . Osteoarthritis   . Joint pain   . Joint swelling   . Chronic back pain   . Internal hemorrhoid   . Urinary frequency   . Urinary urgency   .  Interstitial cystitis     no problems since beginning of Jan 2014   Past Surgical History  Procedure Laterality Date  . Spinal fusion      x 2   . Abdominal hernia repair    . Cholecystectomy    . Uterine ablation    . Abdominal hysterectomy  8/12  . Tonsillectomy    . Cesarean section    . Shoulder surgery Right     x 2  . Esophagogastroduodenoscopy N/A 05/08/2013    Procedure: ESOPHAGOGASTRODUODENOSCOPY (EGD);  Surgeon: Rogene Houston, MD;  Location: [facility];  Service: Endoscopy;  Laterality: N/A;  315  . Colonoscopy     Family History  Problem Relation Age of Onset  . Hyperlipidemia Mother   . Depression Mother   . Anxiety disorder Mother    History  Substance Use Topics  . Smoking status: Former Smoker -- 0.50 packs/day  . Smokeless tobacco: Not on file     Comment: quit smoking almost 61yrs ago  . Alcohol Use: No   OB History   Grav Para Term Preterm Abortions TAB SAB Ect Mult Living  Review of Systems  Constitutional: Positive for fatigue. Negative for fever.  Respiratory: Negative for shortness of breath.   Cardiovascular: Negative for chest pain.  Gastrointestinal: Negative for abdominal pain.  Musculoskeletal: Positive for back pain.       Joint pain   Neurological: Positive for weakness (lower extremities ) and headaches.   A complete 10 system review of systems was obtained and all systems are negative except as noted in the HPI and PMH.     Allergies  Carbamazepine and Penicillins  Home Medications   Prior to Admission medications   Medication Sig Start Date End Date Taking? Authorizing Provider  ALPRAZolam Duanne Moron) 0.5 MG tablet Take 0.5-2.5 mg by mouth 3 (three) times daily as needed for anxiety or sleep. Per Dr. Jonita Albee   Yes Historical Provider, MD  azaTHIOprine (IMURAN) 50 MG tablet Take 50 mg by mouth daily.    Yes Historical Provider, MD  BIOTIN 5000 PO Take 1 tablet by mouth daily.    Yes Historical Provider, MD   bisacodyl (DULCOLAX) 5 MG EC tablet Take 1 tablet (5 mg total) by mouth 2 (two) times daily. 12/27/13  Yes Houston Siren III, MD  cetirizine (ZYRTEC) 10 MG tablet Take 10 mg by mouth daily.    Yes Historical Provider, MD  desonide (DESOWEN) 0.05 % cream Apply 1 application topically daily as needed (to face). Apply to face once daily. 02/05/14 02/05/15 Yes Historical Provider, MD  Flaxseed, Linseed, (FLAXSEED OIL) 1000 MG CAPS Take 2 capsules by mouth daily.    Yes Historical Provider, MD  fluocinonide (LIDEX) 0.05 % external solution Apply twice daily as needed to scalp 01/22/14  Yes Historical Provider, MD  gabapentin (NEURONTIN) 300 MG capsule Take 900 mg by mouth at bedtime.   Yes Historical Provider, MD  HYDROcodone-acetaminophen (NORCO) 10-325 MG per tablet Take 1 tablet by mouth every 6 (six) hours as needed for pain. Per Dr. Arnoldo Morale   Yes Historical Provider, MD  hydroxychloroquine (PLAQUENIL) 200 MG tablet Take 200 mg by mouth 2 (two) times daily.    Yes Historical Provider, MD  lamoTRIgine (LAMICTAL) 25 MG tablet Take 75 mg by mouth every evening.   Yes Historical Provider, MD  meloxicam (MOBIC) 15 MG tablet Take 1 tablet (15 mg total) by mouth daily. 02/18/14  Yes Lysbeth Penner, FNP  methocarbamol (ROBAXIN) 500 MG tablet Take 1 tablet (500 mg total) by mouth 4 (four) times daily as needed for muscle spasms. 02/18/14  Yes Lysbeth Penner, FNP  oxyCODONE-acetaminophen (PERCOCET) 10-325 MG per tablet Take 1 tablet by mouth every evening. Dr. Arnoldo Morale   Yes Historical Provider, MD  polyethylene glycol [facility] / Floria Raveling) packet Take 17 g by mouth daily.   Yes Historical Provider, MD  thalidomide (THALOMID) 100 MG capsule Take 100 mg by mouth at bedtime. Take with water.   Yes Historical Provider, MD   Triage Vitals: BP 98/62  Pulse 70  Temp(Src) 98.4 F (36.9 C) (Oral)  Resp 20  Ht 5\' 4"  (1.626 m)  Wt 155 lb (70.308 kg)  BMI 26.59 kg/m2  SpO2 100% Physical Exam  Nursing note and  vitals reviewed. Constitutional: She is oriented to person, place, and time. She appears well-developed and well-nourished. No distress.  HENT:  Head: Normocephalic and atraumatic.  Mouth/Throat: Oropharynx is clear and moist. No oropharyngeal exudate.  Eyes: Conjunctivae and EOM are normal. Pupils are equal, round, and reactive to light.  Neck: Normal range of motion. Neck supple. No tracheal deviation present.  No meningismus  Cardiovascular: Normal rate, regular rhythm and normal heart sounds.   No murmur heard. Pulmonary/Chest: Effort normal. No respiratory distress.  Abdominal: Soft. There is no tenderness. There is no rebound and no guarding.  Musculoskeletal: Normal range of motion. She exhibits no edema and no tenderness.  No swelling.  5/5 strength in bilateral lower extremities. Ankle plantar and dorsiflexion intact. Great toe extension intact bilaterally. +2 DP and PT pulses. +2 patellar reflexes bilaterally. Normal gait. FROM all major joints without evidence of erythema or edema  Neurological: She is alert and oriented to person, place, and time.  CN 2-12 intact, no ataxia on finger to nose, no nystagmus, 5/5 strength throughout, no pronator drift, Romberg negative, normal gait.  Skin: Skin is warm and dry. No rash noted. No erythema.  Psychiatric: She has a normal mood and affect. Her behavior is normal.    ED Course  Procedures (including critical care time) DIAGNOSTIC STUDIES: Oxygen Saturation is 100% on RA, normal by my interpretation.    COORDINATION OF CARE:  9:55 PM-Discussed treatment plan which includes meds and labs with pt at bedside and pt agreed to plan.   Labs Review Labs Reviewed  CBC WITH DIFFERENTIAL - Abnormal; Notable for the following:    Neutrophils Relative % 35 (*)    Neutro Abs 1.4 (*)    Lymphocytes Relative 48 (*)    All other components within normal limits  COMPREHENSIVE METABOLIC PANEL - Abnormal; Notable for the following:    Total  Bilirubin 0.2 (*)    All other components within normal limits  URINALYSIS, ROUTINE W REFLEX MICROSCOPIC - Abnormal; Notable for the following:    Specific Gravity, Urine <1.005 (*)    All other components within normal limits  SEDIMENTATION RATE  C-REACTIVE PROTEIN    Imaging Review No results found.   EKG Interpretation None      MDM   Final diagnoses:  Myalgia   Patient complains of typical lupus flare with aches in all of her joints for the past 2 days. Denies any fevers, chills, nausea, vomiting. No fevers.  Full range of motion of all major joints without erythema or edema. No evidence of septic joints. No evidence of cauda equina or cord compression.  Labs at baseline. Sedimentation rate 0. Afebrile. Patient drove herself and does not wantanything besides Toradol. She declines steroids. She did not like the way they make her feel.  She has not yet increased his imuran as recommended by her rheumatologist. She has pain medication and antiinflammatories at home and is requesting discharge after toradol. Her labs are stable. She is stable for follow up with her specialists.   Ezequiel Essex, MD 03/08/14 [phone]

## 2014-03-07 NOTE — ED Notes (Signed)
Lupus flare up with joint pain starting today.

## 2014-03-08 LAB — C-REACTIVE PROTEIN: CRP: <0.5 mg/dL — ABNORMAL LOW (ref <0.60)

## 2014-03-12 ENCOUNTER — Emergency Department (HOSPITAL_BASED_OUTPATIENT_CLINIC_OR_DEPARTMENT_OTHER)
Admission: EM | Admit: 2014-03-12 | Discharge: 2014-03-12 | Disposition: A | Discharge status: Home / Self Care | Payer: Medicaid Other | Attending: Emergency Medicine | Admitting: Emergency Medicine

## 2014-03-12 ENCOUNTER — Encounter (HOSPITAL_BASED_OUTPATIENT_CLINIC_OR_DEPARTMENT_OTHER): Payer: Self-pay | Admitting: Emergency Medicine

## 2014-03-12 DIAGNOSIS — Z87828 Personal history of other (healed) physical injury and trauma: Secondary | ICD-10-CM | POA: Insufficient documentation

## 2014-03-12 DIAGNOSIS — R209 Unspecified disturbances of skin sensation: Secondary | ICD-10-CM | POA: Insufficient documentation

## 2014-03-12 DIAGNOSIS — Z859 Personal history of malignant neoplasm, unspecified: Secondary | ICD-10-CM | POA: Insufficient documentation

## 2014-03-12 DIAGNOSIS — Z87448 Personal history of other diseases of urinary system: Secondary | ICD-10-CM | POA: Insufficient documentation

## 2014-03-12 DIAGNOSIS — IMO0002 Reserved for concepts with insufficient information to code with codable children: Secondary | ICD-10-CM | POA: Insufficient documentation

## 2014-03-12 DIAGNOSIS — Z880 Allergy status to penicillin: Secondary | ICD-10-CM | POA: Insufficient documentation

## 2014-03-12 DIAGNOSIS — M545 Low back pain, unspecified: Secondary | ICD-10-CM | POA: Insufficient documentation

## 2014-03-12 DIAGNOSIS — F411 Generalized anxiety disorder: Secondary | ICD-10-CM | POA: Insufficient documentation

## 2014-03-12 DIAGNOSIS — Z8679 Personal history of other diseases of the circulatory system: Secondary | ICD-10-CM | POA: Insufficient documentation

## 2014-03-12 DIAGNOSIS — Z79899 Other long term (current) drug therapy: Secondary | ICD-10-CM | POA: Insufficient documentation

## 2014-03-12 DIAGNOSIS — Z791 Long term (current) use of non-steroidal anti-inflammatories (NSAID): Secondary | ICD-10-CM | POA: Insufficient documentation

## 2014-03-12 DIAGNOSIS — IMO0001 Reserved for inherently not codable concepts without codable children: Secondary | ICD-10-CM | POA: Insufficient documentation

## 2014-03-12 DIAGNOSIS — G8929 Other chronic pain: Secondary | ICD-10-CM

## 2014-03-12 DIAGNOSIS — F3289 Other specified depressive episodes: Secondary | ICD-10-CM | POA: Insufficient documentation

## 2014-03-12 DIAGNOSIS — M329 Systemic lupus erythematosus, unspecified: Secondary | ICD-10-CM | POA: Insufficient documentation

## 2014-03-12 DIAGNOSIS — F329 Major depressive disorder, single episode, unspecified: Secondary | ICD-10-CM | POA: Insufficient documentation

## 2014-03-12 DIAGNOSIS — K5900 Constipation, unspecified: Secondary | ICD-10-CM | POA: Insufficient documentation

## 2014-03-12 DIAGNOSIS — Z8701 Personal history of pneumonia (recurrent): Secondary | ICD-10-CM | POA: Insufficient documentation

## 2014-03-12 DIAGNOSIS — M549 Dorsalgia, unspecified: Secondary | ICD-10-CM

## 2014-03-12 DIAGNOSIS — R011 Cardiac murmur, unspecified: Secondary | ICD-10-CM | POA: Insufficient documentation

## 2014-03-12 DIAGNOSIS — Z87891 Personal history of nicotine dependence: Secondary | ICD-10-CM | POA: Insufficient documentation

## 2014-03-12 DIAGNOSIS — M1990 Unspecified osteoarthritis, unspecified site: Secondary | ICD-10-CM | POA: Insufficient documentation

## 2014-03-12 DIAGNOSIS — Z981 Arthrodesis status: Secondary | ICD-10-CM | POA: Insufficient documentation

## 2014-03-12 DIAGNOSIS — Z8709 Personal history of other diseases of the respiratory system: Secondary | ICD-10-CM | POA: Insufficient documentation

## 2014-03-12 NOTE — ED Notes (Addendum)
Pt c/o Chronic back pain seen at [facility] last week for same

## 2014-03-12 NOTE — ED Notes (Signed)
MD at bedside. 

## 2014-03-12 NOTE — Discharge Instructions (Signed)
Back Pain, Adult Low back pain is very common. About 1 in 5 people have back pain.The cause of low back pain is rarely dangerous. The pain often gets better over time.About half of people with a sudden onset of back pain feel better in just 2 weeks. About 8 in 10 people feel better by 6 weeks.  CAUSES Some common causes of back pain include:  Strain of the muscles or ligaments supporting the spine.  Wear and tear (degeneration) of the spinal discs.  Arthritis.  Direct injury to the back. DIAGNOSIS Most of the time, the direct cause of low back pain is not known.However, back pain can be treated effectively even when the exact cause of the pain is unknown.Answering your caregiver's questions about your overall health and symptoms is one of the most accurate ways to make sure the cause of your pain is not dangerous. If your caregiver needs more information, he or she may order lab work or imaging tests (X-rays or MRIs).However, even if imaging tests show changes in your back, this usually does not require surgery. HOME CARE INSTRUCTIONS For many people, back pain returns.Since low back pain is rarely dangerous, it is often a condition that people can learn to manageon their own.   Remain active. It is stressful on the back to sit or stand in one place. Do not sit, drive, or stand in one place for more than 30 minutes at a time. Take short walks on level surfaces as soon as pain allows.Try to increase the length of time you walk each day.  Do not stay in bed.Resting more than 1 or 2 days can delay your recovery.  Do not avoid exercise or work.Your body is made to move.It is not dangerous to be active, even though your back may hurt.Your back will likely heal faster if you return to being active before your pain is gone.  Pay attention to your body when you bend and lift. Many people have less discomfortwhen lifting if they bend their knees, keep the load close to their bodies,and  avoid twisting. Often, the most comfortable positions are those that put less stress on your recovering back.  Find a comfortable position to sleep. Use a firm mattress and lie on your side with your knees slightly bent. If you lie on your back, put a pillow under your knees.  Only take over-the-counter or prescription medicines as directed by your caregiver. Over-the-counter medicines to reduce pain and inflammation are often the most helpful.Your caregiver may prescribe muscle relaxant drugs.These medicines help dull your pain so you can more quickly return to your normal activities and healthy exercise.  Put ice on the injured area.  Put ice in a plastic bag.  Place a towel between your skin and the bag.  Leave the ice on for 15-20 minutes, 03-04 times a day for the first 2 to 3 days. After that, ice and heat may be alternated to reduce pain and spasms.  Ask your caregiver about trying back exercises and gentle massage. This may be of some benefit.  Avoid feeling anxious or stressed.Stress increases muscle tension and can worsen back pain.It is important to recognize when you are anxious or stressed and learn ways to manage it.Exercise is a great option. SEEK MEDICAL CARE IF:  You have pain that is not relieved with rest or medicine.  You have pain that does not improve in 1 week.  You have new symptoms.  You are generally not feeling well. SEEK   IMMEDIATE MEDICAL CARE IF:   You have pain that radiates from your back into your legs.  You develop new bowel or bladder control problems.  You have unusual weakness or numbness in your arms or legs.  You develop nausea or vomiting.  You develop abdominal pain.  You feel faint. Document Released: 10/10/2005 Document Revised: 04/10/2012 Document Reviewed: 02/28/2011 Exit[employer] Patient Information 2014 [employer].  

## 2014-03-12 NOTE — ED Provider Notes (Signed)
CSN: [id number]     Arrival date & time 03/12/14  1449 History   First MD Initiated Contact with Patient 03/12/14 1501     Chief Complaint  Patient presents with  . Back Pain     (Consider location/radiation/quality/duration/timing/severity/associated sxs/prior Treatment) Patient is a 48 y.o. female presenting with back pain.  Back Pain Location:  Lumbar spine Quality: squeezing. Radiates to: legs. Pain severity:  Severe Onset quality:  Gradual Duration: worse for past 5 months. Timing:  Constant Progression:  Unchanged Chronicity:  Chronic Context comment:  Two months ago she underwent a trial for spinal stimulator Relieved by: lying down. Ineffective treatments: walking, capsacin, muscle relaxers, pain medicines, mobic. Associated symptoms: numbness (no new numbness) and tingling   Associated symptoms: no bladder incontinence, no bowel incontinence, no fever and no perianal numbness     Past Medical History  Diagnosis Date  . Abdominal pain   . Cancer     MOLE PRE  . Arthritis   . Cerebrospinal fluid leak from spinal puncture 08/24/2012  . Raynaud's disease   . Lupus     takes Plaquenil daily  . Anxiety     takes Xanax daily as needed  . Constipation     takes Dulcolax and Miralax daily as needed;also Fiber Pills  . GERD (gastroesophageal reflux disease)     takes Omeprazole daily  . Muscle spasm     takes Robaxin daily as needed  . Depression     takes Lamictal daily  . Heart murmur     slight  . History of bronchitis     last time many yrs ago(44yrs ago)  . Pneumonia     hx of > 29yrs ago  . Headache(784.0)   . Numbness and tingling in hands   . Fibromyalgia   . Osteoarthritis   . Joint pain   . Joint swelling   . Chronic back pain   . Internal hemorrhoid   . Urinary frequency   . Urinary urgency   . Interstitial cystitis     no problems since beginning of Jan 2014   Past Surgical History  Procedure Laterality Date  . Spinal fusion      x 2   .  Abdominal hernia repair    . Cholecystectomy    . Uterine ablation    . Abdominal hysterectomy  8/12  . Tonsillectomy    . Cesarean section    . Shoulder surgery Right     x 2  . Esophagogastroduodenoscopy N/A 05/08/2013    Procedure: ESOPHAGOGASTRODUODENOSCOPY (EGD);  Surgeon: Rogene Houston, MD;  Location: [facility];  Service: Endoscopy;  Laterality: N/A;  315  . Colonoscopy     Family History  Problem Relation Age of Onset  . Hyperlipidemia Mother   . Depression Mother   . Anxiety disorder Mother    History  Substance Use Topics  . Smoking status: Former Smoker -- 0.50 packs/day  . Smokeless tobacco: Not on file     Comment: quit smoking almost 56yrs ago  . Alcohol Use: No   OB History   Grav Para Term Preterm Abortions TAB SAB Ect Mult Living                 Review of Systems  Constitutional: Negative for fever.  Gastrointestinal: Negative for bowel incontinence.  Genitourinary: Negative for bladder incontinence.  Musculoskeletal: Positive for back pain.  Neurological: Positive for tingling and numbness (no new numbness).  All other systems reviewed and  are negative.     Allergies  Carbamazepine and Penicillins  Home Medications   Prior to Admission medications   Medication Sig Start Date End Date Taking? Authorizing Provider  ALPRAZolam Duanne Moron) 0.5 MG tablet Take 0.5-2.5 mg by mouth 3 (three) times daily as needed for anxiety or sleep. Per Dr. Jonita Albee    Historical Provider, MD  azaTHIOprine (IMURAN) 50 MG tablet Take 50 mg by mouth daily.     Historical Provider, MD  BIOTIN 5000 PO Take 1 tablet by mouth daily.     Historical Provider, MD  bisacodyl (DULCOLAX) 5 MG EC tablet Take 1 tablet (5 mg total) by mouth 2 (two) times daily. 12/27/13   Houston Siren III, MD  cetirizine (ZYRTEC) 10 MG tablet Take 10 mg by mouth daily.     Historical Provider, MD  desonide (DESOWEN) 0.05 % cream Apply 1 application topically daily as needed (to face). Apply to face  once daily. 02/05/14 02/05/15  Historical Provider, MD  Flaxseed, Linseed, (FLAXSEED OIL) 1000 MG CAPS Take 2 capsules by mouth daily.     Historical Provider, MD  fluocinonide (LIDEX) 0.05 % external solution Apply twice daily as needed to scalp 01/22/14   Historical Provider, MD  gabapentin (NEURONTIN) 300 MG capsule Take 900 mg by mouth at bedtime.    Historical Provider, MD  HYDROcodone-acetaminophen (NORCO) 10-325 MG per tablet Take 1 tablet by mouth every 6 (six) hours as needed for pain. Per Dr. Arnoldo Morale    Historical Provider, MD  hydroxychloroquine (PLAQUENIL) 200 MG tablet Take 200 mg by mouth 2 (two) times daily.     Historical Provider, MD  lamoTRIgine (LAMICTAL) 25 MG tablet Take 75 mg by mouth every evening.    Historical Provider, MD  meloxicam (MOBIC) 15 MG tablet Take 1 tablet (15 mg total) by mouth daily. 02/18/14   Lysbeth Penner, FNP  methocarbamol (ROBAXIN) 500 MG tablet Take 1 tablet (500 mg total) by mouth 4 (four) times daily as needed for muscle spasms. 02/18/14   Lysbeth Penner, FNP  oxyCODONE-acetaminophen (PERCOCET) 10-325 MG per tablet Take 1 tablet by mouth every evening. Dr. Arnoldo Morale    Historical Provider, MD  polyethylene glycol [facility] / Floria Raveling) packet Take 17 g by mouth daily.    Historical Provider, MD  thalidomide (THALOMID) 100 MG capsule Take 100 mg by mouth at bedtime. Take with water.    Historical Provider, MD   BP 130/5  Pulse 73  Temp(Src) 97.9 F (36.6 C) (Oral)  Resp 16  Wt 155 lb (70.308 kg)  SpO2 100% Physical Exam  Nursing note and vitals reviewed. Constitutional: She is oriented to person, place, and time. She appears well-developed and well-nourished. No distress.  HENT:  Head: Normocephalic and atraumatic.  Mouth/Throat: Oropharynx is clear and moist.  Eyes: Conjunctivae are normal. Pupils are equal, round, and reactive to light. No scleral icterus.  Neck: Neck supple.  Cardiovascular: Normal rate, regular rhythm, normal heart sounds and  intact distal pulses.   No murmur heard. Pulmonary/Chest: Effort normal and breath sounds normal. No stridor. No respiratory distress. She has no rales.  Abdominal: Soft. Bowel sounds are normal. She exhibits no distension. There is no tenderness.  Musculoskeletal: Normal range of motion.  Neurological: She is alert and oriented to person, place, and time. She has normal strength. Gait normal.  Reflex Scores:      Patellar reflexes are 2+ on the right side and 2+ on the left side. Skin: Skin is warm and  dry. No rash noted.  Psychiatric: She has a normal mood and affect. Her behavior is normal.    ED Course  Procedures (including critical care time) Labs Review Labs Reviewed - No data to display  Imaging Review No results found.   EKG Interpretation None      MDM   Final diagnoses:  Chronic back pain    48 yo female with hx of chronic pain.  For past 5 months it has been worse.    I had a long and careful conversation with the patient.  She reported that her symptoms have been constant and chronic for past 5 months.  Only new symptom is some worsening tingling since she had a trial of a spinal stimulator 2 months ago.  She has had no fevers or other symptoms concerning for a possible indolent infection.  She came to the ED hoping for an MRI.  We discussed the indications for emergent MRI for back pain.  I do not think she needs an MRI based on her history and my exam.  The patient also agrees that she does not need an emergent MRI.  She declined pain medication in the ED and declined prescriptions for additional pain medications.  She was given return precautions.  She will try to follow up with her spine surgeon.      Houston Siren III, MD 03/12/14 [phone]

## 2014-03-27 ENCOUNTER — Other Ambulatory Visit: Payer: Self-pay | Admitting: Family Medicine

## 2014-03-28 ENCOUNTER — Other Ambulatory Visit (INDEPENDENT_AMBULATORY_CARE_PROVIDER_SITE_OTHER): Payer: Medicaid Other

## 2014-03-28 DIAGNOSIS — M359 Systemic involvement of connective tissue, unspecified: Secondary | ICD-10-CM

## 2014-03-28 NOTE — Progress Notes (Signed)
Pt came in for labs only 

## 2014-03-28 NOTE — Telephone Encounter (Signed)
Do not see this med on current med list. Please advise

## 2014-03-29 LAB — CBC WITH DIFFERENTIAL
Basophils Absolute: 0 10*3/uL (ref 0.0–0.2)
Basos: 0 %
Eos: 1 %
Eosinophils Absolute: 0.1 10*3/uL (ref 0.0–0.4)
HCT: 37.7 % (ref 34.0–46.6)
Hemoglobin: 13.2 g/dL (ref 11.1–15.9)
Immature Grans (Abs): 0 10*3/uL (ref 0.0–0.1)
Immature Granulocytes: 0 %
Lymphocytes Absolute: 1.2 10*3/uL (ref 0.7–3.1)
Lymphs: 28 %
MCH: 32.9 pg (ref 26.6–33.0)
MCHC: 35 g/dL (ref 31.5–35.7)
MCV: 94 fL (ref 79–97)
Monocytes Absolute: 0.6 10*3/uL (ref 0.1–0.9)
Monocytes: 13 %
Neutrophils Absolute: 2.6 10*3/uL (ref 1.4–7.0)
Neutrophils Relative %: 58 %
Platelets: 295 10*3/uL (ref 150–379)
RBC: 4.01 x10E6/uL (ref 3.77–5.28)
RDW: 13.3 % (ref 12.3–15.4)
WBC: 4.5 10*3/uL (ref 3.4–10.8)

## 2014-03-29 LAB — CREATININE, SERUM
Creatinine, Ser: 0.57 mg/dL (ref 0.57–1.00)
GFR calc Af Amer: 128 mL/min/{1.73_m2} (ref >59)
GFR calc non Af Amer: 111 mL/min/{1.73_m2} (ref >59)

## 2014-03-29 LAB — HEPATIC FUNCTION PANEL
ALT: 15 IU/L (ref 0–32)
AST: 10 IU/L (ref 0–40)
Albumin: 4.7 g/dL (ref 3.5–5.5)
Alkaline Phosphatase: 40 IU/L (ref 39–117)
Bilirubin, Direct: 0.1 mg/dL (ref 0.00–0.40)
Total Bilirubin: 0.3 mg/dL (ref 0.0–1.2)
Total Protein: 6.5 g/dL (ref 6.0–8.5)

## 2014-05-08 ENCOUNTER — Other Ambulatory Visit: Payer: Self-pay | Admitting: Anesthesiology

## 2014-05-12 ENCOUNTER — Other Ambulatory Visit (INDEPENDENT_AMBULATORY_CARE_PROVIDER_SITE_OTHER): Payer: Medicaid Other

## 2014-05-12 DIAGNOSIS — M359 Systemic involvement of connective tissue, unspecified: Secondary | ICD-10-CM

## 2014-05-13 LAB — CBC WITH DIFFERENTIAL
Basophils Absolute: 0 10*3/uL (ref 0.0–0.2)
Basos: 1 %
Eos: 2 %
Eosinophils Absolute: 0.1 10*3/uL (ref 0.0–0.4)
HCT: 38 % (ref 34.0–46.6)
Hemoglobin: 13 g/dL (ref 11.1–15.9)
Immature Grans (Abs): 0 10*3/uL (ref 0.0–0.1)
Immature Granulocytes: 0 %
Lymphocytes Absolute: 1 10*3/uL (ref 0.7–3.1)
Lymphs: 29 %
MCH: 32 pg (ref 26.6–33.0)
MCHC: 34.2 g/dL (ref 31.5–35.7)
MCV: 94 fL (ref 79–97)
Monocytes Absolute: 0.3 10*3/uL (ref 0.1–0.9)
Monocytes: 8 %
Neutrophils Absolute: 2 10*3/uL (ref 1.4–7.0)
Neutrophils Relative %: 60 %
Platelets: 262 10*3/uL (ref 150–379)
RBC: 4.06 x10E6/uL (ref 3.77–5.28)
RDW: 14.1 % (ref 12.3–15.4)
WBC: 3.4 10*3/uL (ref 3.4–10.8)

## 2014-05-13 LAB — HEPATIC FUNCTION PANEL
ALT: 15 IU/L (ref 0–32)
AST: 13 IU/L (ref 0–40)
Albumin: 4.5 g/dL (ref 3.5–5.5)
Alkaline Phosphatase: 35 IU/L — ABNORMAL LOW (ref 39–117)
Bilirubin, Direct: 0.09 mg/dL (ref 0.00–0.40)
Total Bilirubin: 0.3 mg/dL (ref 0.0–1.2)
Total Protein: 6.5 g/dL (ref 6.0–8.5)

## 2014-05-16 ENCOUNTER — Other Ambulatory Visit (HOSPITAL_COMMUNITY): Payer: Medicaid Other

## 2014-05-19 ENCOUNTER — Inpatient Hospital Stay (HOSPITAL_COMMUNITY): Admission: RE | Admit: 2014-05-19 | Payer: Medicaid Other | Source: Ambulatory Visit

## 2014-06-02 ENCOUNTER — Encounter (HOSPITAL_COMMUNITY): Payer: Self-pay | Admitting: Pharmacy Technician

## 2014-06-09 ENCOUNTER — Encounter (HOSPITAL_COMMUNITY)
Admission: RE | Admit: 2014-06-09 | Discharge: 2014-06-09 | Disposition: A | Discharge status: Home / Self Care | Payer: Medicaid Other | Source: Ambulatory Visit | Attending: Anesthesiology | Admitting: Anesthesiology

## 2014-06-09 ENCOUNTER — Encounter (HOSPITAL_COMMUNITY): Payer: Self-pay

## 2014-06-09 DIAGNOSIS — G8929 Other chronic pain: Secondary | ICD-10-CM | POA: Insufficient documentation

## 2014-06-09 DIAGNOSIS — Z01818 Encounter for other preprocedural examination: Secondary | ICD-10-CM | POA: Diagnosis present

## 2014-06-09 DIAGNOSIS — M329 Systemic lupus erythematosus, unspecified: Secondary | ICD-10-CM | POA: Insufficient documentation

## 2014-06-09 DIAGNOSIS — M545 Low back pain, unspecified: Secondary | ICD-10-CM | POA: Diagnosis not present

## 2014-06-09 DIAGNOSIS — M961 Postlaminectomy syndrome, not elsewhere classified: Secondary | ICD-10-CM | POA: Insufficient documentation

## 2014-06-09 DIAGNOSIS — M069 Rheumatoid arthritis, unspecified: Secondary | ICD-10-CM | POA: Insufficient documentation

## 2014-06-09 DIAGNOSIS — IMO0002 Reserved for concepts with insufficient information to code with codable children: Secondary | ICD-10-CM | POA: Diagnosis present

## 2014-06-09 LAB — COMPREHENSIVE METABOLIC PANEL
ALBUMIN: 4 g/dL (ref 3.5–5.2)
ALK PHOS: 32 U/L — AB (ref 39–117)
ALT: 42 U/L — ABNORMAL HIGH (ref 0–35)
AST: 30 U/L (ref 0–37)
Anion gap: 11 (ref 5–15)
BUN: 10 mg/dL (ref 6–23)
CALCIUM: 9.1 mg/dL (ref 8.4–10.5)
CO2: 26 mEq/L (ref 19–32)
Chloride: 95 mEq/L — ABNORMAL LOW (ref 96–112)
Creatinine, Ser: 0.61 mg/dL (ref 0.50–1.10)
GFR calc non Af Amer: >90 mL/min (ref >90)
Glucose, Bld: 93 mg/dL (ref 70–99)
Potassium: 4.7 mEq/L (ref 3.7–5.3)
Sodium: 132 mEq/L — ABNORMAL LOW (ref 137–147)
Total Bilirubin: 0.2 mg/dL — ABNORMAL LOW (ref 0.3–1.2)
Total Protein: 6.4 g/dL (ref 6.0–8.3)

## 2014-06-09 LAB — CBC
HCT: 36.9 % (ref 36.0–46.0)
HEMOGLOBIN: 12.8 g/dL (ref 12.0–15.0)
MCH: 32.7 pg (ref 26.0–34.0)
MCHC: 34.7 g/dL (ref 30.0–36.0)
MCV: 94.1 fL (ref 78.0–100.0)
PLATELETS: 262 10*3/uL (ref 150–400)
RBC: 3.92 MIL/uL (ref 3.87–5.11)
RDW: 12.5 % (ref 11.5–15.5)
WBC: 3.8 10*3/uL — ABNORMAL LOW (ref 4.0–10.5)

## 2014-06-09 LAB — SURGICAL PCR SCREEN
MRSA, PCR: NEGATIVE
STAPHYLOCOCCUS AUREUS: NEGATIVE

## 2014-06-09 NOTE — Progress Notes (Signed)
Office called to release orders

## 2014-06-09 NOTE — Pre-Procedure Instructions (Addendum)
MASSA PE  06/09/2014   Your procedure is scheduled on:  06/13/14  Report to [facility] short stay admitting at 800 AM.  Call this number if you have problems the morning of surgery: [phone]   Remember:   Do not eat food or drink liquids after midnight.   Take these medicines the morning of surgery with A SIP OF WATER: xanax, , , pain med if needed, plaquenil, methocarbamol,prilosec   STOP all herbel meds, nsaids (aleve,naproxen,advil,ibuprofen) now including vitamins, aspirin          Do not wear jewelry, make-up or nail polish.  Do not wear lotions, powders, or perfumes. You may wear deodorant.  Do not shave 48 hours prior to surgery. Men may shave face and neck.  Do not bring valuables to the hospital.  [facility] is not responsible                  for any belongings or valuables.               Contacts, dentures or bridgework may not be worn into surgery.  Leave suitcase in the car. After surgery it may be brought to your room.  For patients admitted to the hospital, discharge time is determined by your                treatment team.               Patients discharged the day of surgery will not be allowed to drive  home.  Name and phone number of your driver:   Special Instructions:  Special Instructions: [facility] - Preparing for Surgery  Before surgery, you can play an important role.  Because skin is not sterile, your skin needs to be as free of germs as possible.  You can reduce the number of germs on you skin by washing with CHG (chlorahexidine gluconate) soap before surgery.  CHG is an antiseptic cleaner which kills germs and bonds with the skin to continue killing germs even after washing.  Please DO NOT use if you have an allergy to CHG or antibacterial soaps.  If your skin becomes reddened/irritated stop using the CHG and inform your nurse when you arrive at Short Stay.  Do not shave (including legs and underarms) for at least 48 hours prior to the first CHG  shower.  You may shave your face.  Please follow these instructions carefully:   1.  Shower with CHG Soap the night before surgery and the morning of Surgery.  2.  If you choose to wash your hair, wash your hair first as usual with your normal shampoo.  3.  After you shampoo, rinse your hair and body thoroughly to remove the Shampoo.  4.  Use CHG as you would any other liquid soap.  You can apply chg directly  to the skin and wash gently with scrungie or a clean washcloth.  5.  Apply the CHG Soap to your body ONLY FROM THE NECK DOWN.  Do not use on open wounds or open sores.  Avoid contact with your eyes ears, mouth and genitals (private parts).  Wash genitals (private parts)       with your normal soap.  6.  Wash thoroughly, paying special attention to the area where your surgery will be performed.  7.  Thoroughly rinse your body with warm water from the neck down.  8.  DO NOT shower/wash with your normal soap after using and rinsing  off the CHG Soap.  9.  Pat yourself dry with a clean towel.            10.  Wear clean pajamas.            11.  Place clean sheets on your bed the night of your first shower and do not sleep with pets.  Day of Surgery  Do not apply any lotions/deodorants the morning of surgery.  Please wear clean clothes to the hospital/surgery center.   Please read over the following fact sheets that you were given: Pain Booklet, Coughing and Deep Breathing, MRSA Information and Surgical Site Infection Prevention

## 2014-06-10 NOTE — Progress Notes (Signed)
Anesthesia Chart Review:  Pt is 48 year old female posted for lumbar spinal cord stimulator insertion on 06/13/14 with Dr. Curt Bears.   PMH: RA, lupus, chronic back pain.   Preoperative labs reviewed. ALT is 42, less than 2x normal range.   Cxr and EKG from 08/2013 reviewed.   If no changes I anticipate pt may proceed with surgery as scheduled.   Willeen Cass, FNP-BC [facility]/Anesthesiology Phone: [phone] 06/10/2014 2:02 PM

## 2014-06-12 ENCOUNTER — Encounter (HOSPITAL_COMMUNITY): Payer: Self-pay | Admitting: Anesthesiology

## 2014-06-12 MED ORDER — VANCOMYCIN HCL IN DEXTROSE 1-5 GM/200ML-% IV SOLN
1000.0000 mg | INTRAVENOUS | Status: AC
  Administered 2014-06-13: 1000 mg via INTRAVENOUS
  Filled 2014-06-12: qty 200

## 2014-06-12 NOTE — H&P (Signed)
Maureen [name] is an 48 y.o. female.   Chief Complaint: back pain, radiation into legs HPI: 16 year oldthe past medical history than includes degenerative lumbar spine disease, status post anterior and posterior fusion with Dr. Arnoldo Morale. Despite corrective surgery, with good neural decompression, the patient continues to have low back pain and radiating symptoms into her bilateral extremities.  She has had limited benefit from any type of medication management that is included muscle relaxants, NSAIDs, neuromodulatory agents, and opioid analgesics.  She hastrialed injections, that did not improve her symptoms.earlier in 2015, she then ultimately underwent spinal cord stimulator trial after psychological evaluation and clearance.  Patient enjoyed significantly better than 50% improvement in her pain symptoms, increased level of activity.   Past Medical History  Diagnosis Date  . Abdominal pain   . Cancer     MOLE PRE  . Arthritis   . Cerebrospinal fluid leak from spinal puncture 08/24/2012  . Raynaud's disease   . Lupus     takes Plaquenil daily  . Anxiety     takes Xanax daily as needed  . Constipation     takes Dulcolax and Miralax daily as needed;also Fiber Pills  . GERD (gastroesophageal reflux disease)     takes Omeprazole daily  . Muscle spasm     takes Robaxin daily as needed  . Depression     takes Lamictal daily  . Heart murmur     slight  . History of bronchitis     last time many yrs ago(19yrs ago)  . Pneumonia     hx of > 53yrs ago  . Headache(784.0)   . Numbness and tingling in hands   . Fibromyalgia   . Osteoarthritis   . Joint pain   . Joint swelling   . Chronic back pain   . Internal hemorrhoid   . Urinary frequency   . Urinary urgency   . Interstitial cystitis     no problems since beginning of Jan 2014    Past Surgical History  Procedure Laterality Date  . Spinal fusion      x 2   . Abdominal hernia repair    . Cholecystectomy    . Uterine ablation     . Abdominal hysterectomy  8/12  . Tonsillectomy    . Cesarean section    . Shoulder surgery Right     x 2  . Esophagogastroduodenoscopy N/A 05/08/2013    Procedure: ESOPHAGOGASTRODUODENOSCOPY (EGD);  Surgeon: Rogene Houston, MD;  Location: [facility];  Service: Endoscopy;  Laterality: N/A;  315  . Colonoscopy    . Appendectomy    . Ovarian cyst removal    . Diagnostic laparoscopy      adhesions    Family History  Problem Relation Age of Onset  . Hyperlipidemia Mother   . Depression Mother   . Anxiety disorder Mother    Social History:  reports that she quit smoking about 6 years ago. Her smoking use included Cigarettes. She has a 10 pack-year smoking history. She does not have any smokeless tobacco history on file. She reports that she does not drink alcohol or use illicit drugs.  Allergies:  Allergies  Allergen Reactions  . Carbamazepine Rash  . Penicillins Rash    Medications Prior to Admission  Medication Sig Dispense Refill  . ALPRAZolam (XANAX) 0.5 MG tablet Take 0.5-2.5 mg by mouth 3 (three) times daily as needed for anxiety or sleep. Per Dr. Jonita Albee      . azaTHIOprine (IMURAN)  50 MG tablet Take 125 mg by mouth daily.       . cetirizine (ZYRTEC) 10 MG tablet Take 10 mg by mouth daily as needed for allergies.       Marland Kitchen desonide (DESOWEN) 0.05 % cream Apply 1 application topically daily as needed (to face). Apply to face once daily.      . DULoxetine (CYMBALTA) 20 MG capsule Take 20 mg by mouth daily.      . fluocinonide (LIDEX) 0.05 % external solution Apply twice daily as needed to scalp      . gabapentin (NEURONTIN) 300 MG capsule Take 1,200 mg by mouth at bedtime.       Marland Kitchen HYDROcodone-acetaminophen (NORCO) 10-325 MG per tablet Take 1 tablet by mouth every 6 (six) hours as needed for pain. Per Dr. Arnoldo Morale      . hydroxychloroquine (PLAQUENIL) 200 MG tablet Take 200 mg by mouth 2 (two) times daily.       Marland Kitchen lamoTRIgine (LAMICTAL) 25 MG tablet Take 75 mg by mouth every  evening.      . methocarbamol (ROBAXIN) 500 MG tablet Take 1 tablet (500 mg total) by mouth 4 (four) times daily as needed for muscle spasms.  60 tablet  5  . Multiple Vitamin (MULTIVITAMIN WITH MINERALS) TABS tablet Take 1 tablet by mouth daily.      Marland Kitchen omeprazole (PRILOSEC) 40 MG capsule Take 40 mg by mouth daily.      Marland Kitchen oxyCODONE-acetaminophen (PERCOCET) 10-325 MG per tablet Take 1 tablet by mouth every evening. Dr. Arnoldo Morale      . polycarbophil (FIBERCON) 625 MG tablet Take 625 mg by mouth daily.      . polyethylene glycol (MIRALAX / GLYCOLAX) packet Take 17 g by mouth daily.      . thalidomide (THALOMID) 100 MG capsule Take 100 mg by mouth at bedtime. Take with water.        No results found for this or any previous visit (from the past 48 hour(s)). No results found.  Review of Systems  Constitutional: Negative.   HENT: Negative.   Eyes: Negative.   Respiratory: Negative.   Cardiovascular: Negative.   Gastrointestinal: Negative.   Musculoskeletal: Positive for back pain.  Skin: Negative.   Neurological: Negative.   Endo/Heme/Allergies: Negative.   Psychiatric/Behavioral: Negative.     There were no vitals taken for this visit. Physical Exam  Constitutional: She is oriented to person, place, and time. She appears well-developed and well-nourished.  HENT:  Head: Normocephalic and atraumatic.  Eyes: EOM are normal. Pupils are equal, round, and reactive to light.  Neck: Normal range of motion.  Cardiovascular: Normal rate and regular rhythm.   Respiratory: Effort normal and breath sounds normal.  Neurological: She is alert and oriented to person, place, and time.  Skin: Skin is warm and dry.  Psychiatric: She has a normal mood and affect. Her behavior is normal. Judgment and thought content normal.     Assessment/Plan 1) chronic pain; lumbago; lumbar radiculopathy; lumbar postlaminectomy syndrome PLAN: Permanent implant spinal cord stimulator  Isaak Delmundo C 06/13/2014,  10:21 AM

## 2014-06-13 ENCOUNTER — Encounter (HOSPITAL_COMMUNITY): Payer: Medicaid Other | Admitting: Vascular Surgery

## 2014-06-13 ENCOUNTER — Ambulatory Visit (HOSPITAL_COMMUNITY): Payer: Medicaid Other

## 2014-06-13 ENCOUNTER — Encounter (HOSPITAL_COMMUNITY): Payer: Self-pay | Admitting: Certified Registered Nurse Anesthetist

## 2014-06-13 ENCOUNTER — Ambulatory Visit (HOSPITAL_COMMUNITY)
Admission: RE | Admit: 2014-06-13 | Discharge: 2014-06-13 | Disposition: A | Discharge status: Home / Self Care | Payer: Medicaid Other | Source: Ambulatory Visit | Attending: Anesthesiology | Admitting: Anesthesiology

## 2014-06-13 ENCOUNTER — Encounter (HOSPITAL_COMMUNITY)
Admission: RE | Disposition: A | Discharge status: Home / Self Care | Payer: Self-pay | Source: Ambulatory Visit | Attending: Anesthesiology

## 2014-06-13 ENCOUNTER — Ambulatory Visit (HOSPITAL_COMMUNITY): Payer: Medicaid Other | Admitting: Certified Registered Nurse Anesthetist

## 2014-06-13 DIAGNOSIS — I739 Peripheral vascular disease, unspecified: Secondary | ICD-10-CM | POA: Insufficient documentation

## 2014-06-13 DIAGNOSIS — Z79899 Other long term (current) drug therapy: Secondary | ICD-10-CM | POA: Diagnosis not present

## 2014-06-13 DIAGNOSIS — F411 Generalized anxiety disorder: Secondary | ICD-10-CM | POA: Insufficient documentation

## 2014-06-13 DIAGNOSIS — Z87891 Personal history of nicotine dependence: Secondary | ICD-10-CM | POA: Diagnosis not present

## 2014-06-13 DIAGNOSIS — M329 Systemic lupus erythematosus, unspecified: Secondary | ICD-10-CM | POA: Insufficient documentation

## 2014-06-13 DIAGNOSIS — M961 Postlaminectomy syndrome, not elsewhere classified: Secondary | ICD-10-CM | POA: Diagnosis not present

## 2014-06-13 DIAGNOSIS — IMO0002 Reserved for concepts with insufficient information to code with codable children: Secondary | ICD-10-CM | POA: Diagnosis not present

## 2014-06-13 DIAGNOSIS — F3289 Other specified depressive episodes: Secondary | ICD-10-CM | POA: Insufficient documentation

## 2014-06-13 DIAGNOSIS — K219 Gastro-esophageal reflux disease without esophagitis: Secondary | ICD-10-CM | POA: Insufficient documentation

## 2014-06-13 DIAGNOSIS — F329 Major depressive disorder, single episode, unspecified: Secondary | ICD-10-CM | POA: Diagnosis not present

## 2014-06-13 DIAGNOSIS — IMO0001 Reserved for inherently not codable concepts without codable children: Secondary | ICD-10-CM | POA: Diagnosis not present

## 2014-06-13 HISTORY — PX: SPINAL CORD STIMULATOR INSERTION: SHX5378

## 2014-06-13 SURGERY — INSERTION, SPINAL CORD STIMULATOR, LUMBAR
Anesthesia: Monitor Anesthesia Care

## 2014-06-13 MED ORDER — LACTATED RINGERS IV SOLN
INTRAVENOUS | Status: DC | PRN
  Administered 2014-06-13: 10:00:00 via INTRAVENOUS

## 2014-06-13 MED ORDER — BUPIVACAINE-EPINEPHRINE (PF) 0.5% -1:200000 IJ SOLN
INTRAMUSCULAR | Status: DC | PRN
  Administered 2014-06-13: 30 mL
  Administered 2014-06-13: 8 mL

## 2014-06-13 MED ORDER — PROPOFOL 10 MG/ML IV BOLUS
INTRAVENOUS | Status: AC
Start: 2014-06-13 — End: 2014-06-13
  Filled 2014-06-13: qty 20

## 2014-06-13 MED ORDER — ONDANSETRON HCL 4 MG/2ML IJ SOLN
INTRAMUSCULAR | Status: DC | PRN
  Administered 2014-06-13: 4 mg via INTRAVENOUS

## 2014-06-13 MED ORDER — MIDAZOLAM HCL 2 MG/2ML IJ SOLN
INTRAMUSCULAR | Status: AC
  Filled 2014-06-13: qty 2

## 2014-06-13 MED ORDER — FENTANYL CITRATE 0.05 MG/ML IJ SOLN: 25.0000 ug | INTRAMUSCULAR | Status: DC | PRN

## 2014-06-13 MED ORDER — PROPOFOL 10 MG/ML IV BOLUS
INTRAVENOUS | Status: AC
  Filled 2014-06-13: qty 20

## 2014-06-13 MED ORDER — ONDANSETRON HCL 4 MG/2ML IJ SOLN
INTRAMUSCULAR | Status: AC
  Filled 2014-06-13: qty 2

## 2014-06-13 MED ORDER — FENTANYL CITRATE 0.05 MG/ML IJ SOLN
INTRAMUSCULAR | Status: DC | PRN
  Administered 2014-06-13 (×2): 50 ug via INTRAVENOUS
  Administered 2014-06-13 (×2): 25 ug via INTRAVENOUS

## 2014-06-13 MED ORDER — 0.9 % SODIUM CHLORIDE (POUR BTL) OPTIME
TOPICAL | Status: DC | PRN
  Administered 2014-06-13: 1000 mL

## 2014-06-13 MED ORDER — DEXAMETHASONE SODIUM PHOSPHATE 4 MG/ML IJ SOLN
INTRAMUSCULAR | Status: AC
  Filled 2014-06-13: qty 1

## 2014-06-13 MED ORDER — MIDAZOLAM HCL 5 MG/5ML IJ SOLN
INTRAMUSCULAR | Status: DC | PRN
  Administered 2014-06-13: 2 mg via INTRAVENOUS

## 2014-06-13 MED ORDER — CLINDAMYCIN HCL 150 MG PO CAPS: 150.0000 mg | ORAL_CAPSULE | Freq: Three times a day (TID) | ORAL | Status: DC

## 2014-06-13 MED ORDER — HYDROCODONE-ACETAMINOPHEN 10-325 MG PO TABS: 1.0000 | ORAL_TABLET | ORAL | Status: DC | PRN

## 2014-06-13 MED ORDER — LIDOCAINE HCL (CARDIAC) 20 MG/ML IV SOLN
INTRAVENOUS | Status: AC
  Filled 2014-06-13: qty 5

## 2014-06-13 MED ORDER — FENTANYL CITRATE 0.05 MG/ML IJ SOLN
INTRAMUSCULAR | Status: AC
  Filled 2014-06-13: qty 5

## 2014-06-13 MED ORDER — OXYCODONE HCL 5 MG PO TABS
5.0000 mg | ORAL_TABLET | Freq: Once | ORAL | Status: AC | PRN
  Administered 2014-06-13: 5 mg via ORAL

## 2014-06-13 MED ORDER — BACITRACIN-NEOMYCIN-POLYMYXIN OINTMENT TUBE
TOPICAL_OINTMENT | CUTANEOUS | Status: DC | PRN
Start: 2014-06-13 — End: 2014-06-13
  Administered 2014-06-13: 1 via TOPICAL

## 2014-06-13 MED ORDER — LIDOCAINE HCL (CARDIAC) 20 MG/ML IV SOLN
INTRAVENOUS | Status: DC | PRN
  Administered 2014-06-13: 80 mg via INTRAVENOUS

## 2014-06-13 MED ORDER — SODIUM CHLORIDE 0.9 % IR SOLN
Status: DC | PRN
  Administered 2014-06-13: 11:00:00

## 2014-06-13 MED ORDER — PROPOFOL INFUSION 10 MG/ML OPTIME
INTRAVENOUS | Status: DC | PRN
  Administered 2014-06-13: 50 ug/kg/min via INTRAVENOUS

## 2014-06-13 MED ORDER — ONDANSETRON HCL 4 MG/2ML IJ SOLN: 4.0000 mg | Freq: Four times a day (QID) | INTRAMUSCULAR | Status: DC | PRN

## 2014-06-13 MED ORDER — ROCURONIUM BROMIDE 50 MG/5ML IV SOLN
INTRAVENOUS | Status: AC
  Filled 2014-06-13: qty 1

## 2014-06-13 MED ORDER — OXYCODONE HCL 5 MG/5ML PO SOLN: 5.0000 mg | Freq: Once | ORAL | Status: AC | PRN

## 2014-06-13 SURGICAL SUPPLY — 69 items
ADH SKN CLS APL DERMABOND .7 (GAUZE/BANDAGES/DRESSINGS) ×1
ANCH LD 4 SET SPNL CORD STM (Anchor) ×1 IMPLANT
ANCHOR CLICK (Anchor) ×1 IMPLANT
ANCHOR CLIK NEURO F/LEAD 2 (Anchor) IMPLANT
APL SKNCLS STERI-STRIP NONHPOA (GAUZE/BANDAGES/DRESSINGS)
BAG DECANTER FOR FLEXI CONT (MISCELLANEOUS) ×2 IMPLANT
BENZOIN TINCTURE PRP APPL 2/3 (GAUZE/BANDAGES/DRESSINGS) IMPLANT
BINDER ABDOMINAL 12 ML 46-62 (SOFTGOODS) ×2 IMPLANT
BLADE SURG ROTATE 9660 (MISCELLANEOUS) IMPLANT
CABLE/EXTENSION OR 1X16 61 (CABLE) ×2 IMPLANT
CHLORAPREP W/TINT 26ML (MISCELLANEOUS) ×2 IMPLANT
CONT SPEC 4OZ CLIKSEAL STRL BL (MISCELLANEOUS) ×2 IMPLANT
DERMABOND ADVANCED (GAUZE/BANDAGES/DRESSINGS) ×1
DERMABOND ADVANCED .7 DNX12 (GAUZE/BANDAGES/DRESSINGS) ×1 IMPLANT
DRAPE C-ARM 42X72 X-RAY (DRAPES) ×2 IMPLANT
DRAPE C-ARMOR (DRAPES) ×2 IMPLANT
DRAPE LAPAROTOMY 100X72X124 (DRAPES) ×2 IMPLANT
DRAPE POUCH INSTRU U-SHP 10X18 (DRAPES) ×2 IMPLANT
DRAPE SURG 17X23 STRL (DRAPES) ×2 IMPLANT
DRSG OPSITE POSTOP 3X4 (GAUZE/BANDAGES/DRESSINGS) ×1 IMPLANT
DRSG OPSITE POSTOP 4X6 (GAUZE/BANDAGES/DRESSINGS) IMPLANT
DRSG OPSITE POSTOP 4X8 (GAUZE/BANDAGES/DRESSINGS) ×1 IMPLANT
DRSG TELFA 3X8 NADH (GAUZE/BANDAGES/DRESSINGS) IMPLANT
ELECT REM PT RETURN 9FT ADLT (ELECTROSURGICAL) ×2
ELECTRODE REM PT RTRN 9FT ADLT (ELECTROSURGICAL) ×1 IMPLANT
GAUZE SPONGE 4X4 16PLY XRAY LF (GAUZE/BANDAGES/DRESSINGS) ×2 IMPLANT
GLOVE BIOGEL PI IND STRL 7.5 (GLOVE) ×1 IMPLANT
GLOVE BIOGEL PI INDICATOR 7.5 (GLOVE) ×1
GLOVE ECLIPSE 7.5 STRL STRAW (GLOVE) ×2 IMPLANT
GLOVE EXAM NITRILE LRG STRL (GLOVE) IMPLANT
GLOVE EXAM NITRILE MD LF STRL (GLOVE) IMPLANT
GLOVE EXAM NITRILE XL STR (GLOVE) IMPLANT
GLOVE EXAM NITRILE XS STR PU (GLOVE) IMPLANT
GOWN STRL REUS W/ TWL LRG LVL3 (GOWN DISPOSABLE) IMPLANT
GOWN STRL REUS W/ TWL XL LVL3 (GOWN DISPOSABLE) IMPLANT
GOWN STRL REUS W/TWL 2XL LVL3 (GOWN DISPOSABLE) IMPLANT
GOWN STRL REUS W/TWL LRG LVL3 (GOWN DISPOSABLE)
GOWN STRL REUS W/TWL XL LVL3 (GOWN DISPOSABLE) ×2
IPG PRECISION SPECTRA (Stimulator) ×1 IMPLANT
KIT BASIN OR (CUSTOM PROCEDURE TRAY) ×2 IMPLANT
KIT CHARGING (KITS) ×1
KIT CHARGING PRECISION NEURO (KITS) IMPLANT
KIT REMOTE CONTROL PRECISION (KITS) ×1 IMPLANT
KIT ROOM TURNOVER OR (KITS) ×2 IMPLANT
KIT SPLITTER 30CM 2X8 (Stimulator) ×2 IMPLANT
LEAD KIT CONTACT INFINION 16 (Stimulator) ×2 IMPLANT
NDL 18GX1X1/2 (RX/OR ONLY) (NEEDLE) IMPLANT
NDL HYPO 25X1 1.5 SAFETY (NEEDLE) ×1 IMPLANT
NEEDLE 18GX1X1/2 (RX/OR ONLY) (NEEDLE) IMPLANT
NEEDLE HYPO 25X1 1.5 SAFETY (NEEDLE) ×2 IMPLANT
NS IRRIG 1000ML POUR BTL (IV SOLUTION) ×2 IMPLANT
PACK LAMINECTOMY NEURO (CUSTOM PROCEDURE TRAY) ×2 IMPLANT
PAD ARMBOARD 7.5X6 YLW CONV (MISCELLANEOUS) ×2 IMPLANT
PAD DRESSING TELFA 3X8 NADH (GAUZE/BANDAGES/DRESSINGS) IMPLANT
SPONGE LAP 4X18 X RAY DECT (DISPOSABLE) ×2 IMPLANT
SPONGE SURGIFOAM ABS GEL SZ50 (HEMOSTASIS) IMPLANT
STAPLER SKIN PROX WIDE 3.9 (STAPLE) ×2 IMPLANT
STRIP CLOSURE SKIN 1/2X4 (GAUZE/BANDAGES/DRESSINGS) IMPLANT
SUT MNCRL AB 4-0 PS2 18 (SUTURE) IMPLANT
SUT SILK 0 (SUTURE) ×2
SUT SILK 0 MO-6 18XCR BRD 8 (SUTURE) ×1 IMPLANT
SUT SILK 0 TIES 10X30 (SUTURE) IMPLANT
SUT SILK 2 0 TIES 10X30 (SUTURE) IMPLANT
SUT VIC AB 2-0 CP2 18 (SUTURE) ×4 IMPLANT
SYRINGE 10CC LL (SYRINGE) IMPLANT
TOWEL OR 17X24 6PK STRL BLUE (TOWEL DISPOSABLE) ×2 IMPLANT
TOWEL OR 17X26 10 PK STRL BLUE (TOWEL DISPOSABLE) ×2 IMPLANT
WATER STERILE IRR 1000ML POUR (IV SOLUTION) ×2 IMPLANT
YANKAUER SUCT BULB TIP NO VENT (SUCTIONS) ×2 IMPLANT

## 2014-06-13 NOTE — Op Note (Signed)
PREOP DX: 1) lumbago  2) lumbar radiculopathy  3) lumbar post-laminectomy syndrome  4) chronic pain  POSTOP DX: 1) lumbago  2) lumbar radiculopathy  3) lumbar post-laminectomy syndrome  4) chronic pain  PROCEDURES PERFORMED:1) intraop fluoro 2) placement of 2 16 contact [employer] Infinion leads 3) placement of Spectra SCS generator  SURGEON:Lorenza Winkleman  ASSISTANT: NONE  ANESTHESIA: MAC  EBL: <20cc  DESCRIPTION OF PROCEDURE: After a discussion of risks, benefits and alternatives, informed consent was obtained. The patient was taken to the OR, turned prone onto a Jackson table, all pressure points padded, SCD's placed, and an adequate plane of anesthesia induced. A timeout was taken to verify the correct patient, position, personnel, availability of appropriate equipment, and administration of perioperative antibiotics.  The thoracic and lumbar areas were widely prepped with chloraprep and draped into a sterile field. Fluoroscopy was used to plan a right paramedian incision at the L1-L3 levels, and an incision made with a 10 blade and carried down to the dorsolumbar fascia with the bovie and blunt dissection. Retractors were placed and a 14g [employer] tuohy needle placed into the epidural space at the L1-2 interspace using biplanar fluoro and loss-of-resistance technique. The needle was aspirated without any return of fluid. A [employer] INFINION lead was introduced and under live AP fluoro advanced until the distal-most contact overlay the caudad aspect of the T8  vertebral body shadow with the rest of the contacts distributed over the T8 and T9 vertebral bodies in a position just right of anatomic midline. A second Infinion lead was placed just left of anatomic midline in the same levels using the same technique. The patient was awakened and the leads tested; impedances were good, and the patient reported good coverage with amplitudes in the 3-7 mA range. 0 silk sutures were placed  in the fascia adjacent to the needles. The needles and stylets were removed under fluoroscopy with no lead migration noted. Leads were then fixed to the fascia with Clik anchors; repeat images were obtained to verify that there had been no lead migration.  The incision was inspected and hemostasis obtained with the bipolar cautery.  Attention was then turned to creation of a subcutaneous pocket. At the right flank, a 3 cm incision was made with a 10 blade and using the bovie and blunt dissection a pocket of size appropriate to place a SCS generator. The pocket was trialed, and found to be of adequate size. The pocket was inspected for hemostasis, which was found to be excellent. Using reverse seldinger technique, the leads were tunneled to the pocket site, and the leads inserted into the SCS generator. Impedances were checked, and all found to be excellent. The leads were then all fixed into position with a self-torquing wrench. The wiring was all carefully coiled, placed behind the generator and placed in the pocket.  Both incisions were copiously irrigated with bacitracin-containing irrigation. The lumbar incision was closed in 2 deep layers of interrupted 2-0 vicryl and the skin closed with a running 3-0 monocryl subcuticular suture and dermabond. The pocket incision was closed with a deeper layer of 2-0 vicryl interrupted sutures, and the skin closed with staples.  Antibiotic ointment and sterile dressings were applied. Needle, sponge, and instrument counts were correct x2 at the end of the case.  The patient was then carefully awakened from anesthesia, turned supine, an abdominal binder placed, and the patient taken to the recovery room where she underwent complex spinal cord stimulator programming.  COMPLICATIONS: NONE  CONDITION: Stable throughout the course of the procedure and immediately afterward  DISPOSITION: discharge to home, with antibiotics and pain medicine. Discussed care with the patient  and her mother. Followup in clinic will be scheduled in 10-14 days.

## 2014-06-13 NOTE — Progress Notes (Signed)
(   late entry) Patient was told if that the medications that the MD said she needed were not called into the Pharmacy, please call the MD to get them ordered.  We had no prescriptions for this patient for discharge

## 2014-06-13 NOTE — Transfer of Care (Signed)
Immediate Anesthesia Transfer of Care Note  Patient: Maureen [name]  Procedure(s) Performed: Procedure(s): LUMBAR SPINAL CORD STIMULATOR INSERTION (N/A)  Patient Location: PACU  Anesthesia Type:MAC  Level of Consciousness: awake, alert  and oriented  Airway & Oxygen Therapy: Patient Spontanous Breathing  Post-op Assessment: Report given to PACU RN and Post -op Vital signs reviewed and stable  Post vital signs: Reviewed and stable  Complications: No apparent anesthesia complications

## 2014-06-13 NOTE — Discharge Instructions (Signed)
What to eat: For your first meals, you should eat lightly; only small meals initially.  If you do not have nausea, you may eat larger meals.  Avoid spicy, greasy and heavy food.    WHAT TO EXPECT AFTER THE PROCEDURE  After the procedure, it is typical to experience:  Sleepiness.  Nausea and vomiting. HOME CARE INSTRUCTIONS  For the first 24 hours after general anesthesia:  Have a responsible person with you.  Do not drive a car. If you are alone, do not take public transportation.  Do not drink alcohol.  Do not take medicine that has not been prescribed by your health care provider.  Do not sign important papers or make important decisions.  You may resume a normal diet and activities as directed by your health care provider.    HOME CARE  For stitches (sutures) or staples:  Keep the cut clean and dry.  Can remove dressings in 3 days; it is optional whether to place new bandages or not Wash the cut with soap and water 2 times a day. Rinse the cut with water. Pat it dry with a clean towel.  Put a thin layer of medicated cream on the cut as told by your doctor.  Do not soak the incicion in water until the MD says to.  Only take medicines as told by your doctor.  Have your stitches or staples removed as told by your doctor. GET HELP RIGHT AWAY IF:  Your incision is red, puffy (swollen), or painful.  You have a red line on the skin near the cut.  You have yellowish-white fluid (pus) coming from the cut.  You have a fever.  You have a bad smell coming from the incision or bandage.  Your incision breaks open before or after stitches are removed.  You notice something coming out of the cut, such as wood or glass.   MAKE SURE YOU:  Understand these instructions.  Will watch your condition.  Will get help right away if you are not doing well or get worse. Document Released: 03/28/2008 Document Revised: 01/02/2012 Document Reviewed: 04/05/2011  [employer] Patient Information 2014  [place].

## 2014-06-13 NOTE — Anesthesia Preprocedure Evaluation (Addendum)
Anesthesia Evaluation  Patient identified by MRN, date of birth, ID band Patient awake    Reviewed: Allergy & Precautions, H&P , NPO status , Patient's Chart, lab work & pertinent test results  Airway Mallampati: II TM Distance: >3 FB Neck ROM: full    Dental  (+) Teeth Intact, Dental Advisory Given   Pulmonary former smoker,          Cardiovascular + Peripheral Vascular Disease     Neuro/Psych  Headaches, Anxiety Depression  Neuromuscular disease    GI/Hepatic GERD-  ,  Endo/Other    Renal/GU      Musculoskeletal  (+) Fibromyalgia -  Abdominal   Peds  Hematology   Anesthesia Other Findings   Reproductive/Obstetrics                          Anesthesia Physical Anesthesia Plan  ASA: III  Anesthesia Plan: MAC   Post-op Pain Management:    Induction: Intravenous  Airway Management Planned: Simple Face Mask  Additional Equipment:   Intra-op Plan:   Post-operative Plan: Extubation in OR  Informed Consent: I have reviewed the patients History and Physical, chart, labs and discussed the procedure including the risks, benefits and alternatives for the proposed anesthesia with the patient or authorized representative who has indicated his/her understanding and acceptance.     Plan Discussed with: CRNA, Anesthesiologist and Surgeon  Anesthesia Plan Comments:         Anesthesia Quick Evaluation

## 2014-06-15 ENCOUNTER — Emergency Department (HOSPITAL_BASED_OUTPATIENT_CLINIC_OR_DEPARTMENT_OTHER)
Admission: EM | Admit: 2014-06-15 | Discharge: 2014-06-15 | Disposition: A | Discharge status: Home / Self Care | Payer: Medicaid Other | Attending: Emergency Medicine | Admitting: Emergency Medicine

## 2014-06-15 ENCOUNTER — Encounter (HOSPITAL_BASED_OUTPATIENT_CLINIC_OR_DEPARTMENT_OTHER): Payer: Self-pay | Admitting: Emergency Medicine

## 2014-06-15 DIAGNOSIS — IMO0002 Reserved for concepts with insufficient information to code with codable children: Secondary | ICD-10-CM

## 2014-06-15 DIAGNOSIS — K5900 Constipation, unspecified: Secondary | ICD-10-CM | POA: Diagnosis not present

## 2014-06-15 DIAGNOSIS — M129 Arthropathy, unspecified: Secondary | ICD-10-CM | POA: Insufficient documentation

## 2014-06-15 DIAGNOSIS — G8929 Other chronic pain: Secondary | ICD-10-CM | POA: Insufficient documentation

## 2014-06-15 DIAGNOSIS — F3289 Other specified depressive episodes: Secondary | ICD-10-CM | POA: Diagnosis not present

## 2014-06-15 DIAGNOSIS — Z8701 Personal history of pneumonia (recurrent): Secondary | ICD-10-CM | POA: Insufficient documentation

## 2014-06-15 DIAGNOSIS — Z87448 Personal history of other diseases of urinary system: Secondary | ICD-10-CM | POA: Diagnosis not present

## 2014-06-15 DIAGNOSIS — K219 Gastro-esophageal reflux disease without esophagitis: Secondary | ICD-10-CM | POA: Diagnosis not present

## 2014-06-15 DIAGNOSIS — Z79899 Other long term (current) drug therapy: Secondary | ICD-10-CM | POA: Insufficient documentation

## 2014-06-15 DIAGNOSIS — Z87891 Personal history of nicotine dependence: Secondary | ICD-10-CM | POA: Diagnosis not present

## 2014-06-15 DIAGNOSIS — Z4801 Encounter for change or removal of surgical wound dressing: Secondary | ICD-10-CM | POA: Insufficient documentation

## 2014-06-15 DIAGNOSIS — Z880 Allergy status to penicillin: Secondary | ICD-10-CM | POA: Insufficient documentation

## 2014-06-15 DIAGNOSIS — Z8679 Personal history of other diseases of the circulatory system: Secondary | ICD-10-CM | POA: Insufficient documentation

## 2014-06-15 DIAGNOSIS — Z792 Long term (current) use of antibiotics: Secondary | ICD-10-CM | POA: Diagnosis not present

## 2014-06-15 DIAGNOSIS — R110 Nausea: Secondary | ICD-10-CM | POA: Diagnosis not present

## 2014-06-15 DIAGNOSIS — F329 Major depressive disorder, single episode, unspecified: Secondary | ICD-10-CM | POA: Insufficient documentation

## 2014-06-15 DIAGNOSIS — R011 Cardiac murmur, unspecified: Secondary | ICD-10-CM | POA: Diagnosis not present

## 2014-06-15 DIAGNOSIS — Y831 Surgical operation with implant of artificial internal device as the cause of abnormal reaction of the patient, or of later complication, without mention of misadventure at the time of the procedure: Secondary | ICD-10-CM | POA: Insufficient documentation

## 2014-06-15 DIAGNOSIS — Z8709 Personal history of other diseases of the respiratory system: Secondary | ICD-10-CM | POA: Diagnosis not present

## 2014-06-15 DIAGNOSIS — R42 Dizziness and giddiness: Secondary | ICD-10-CM | POA: Insufficient documentation

## 2014-06-15 DIAGNOSIS — Z85820 Personal history of malignant melanoma of skin: Secondary | ICD-10-CM | POA: Insufficient documentation

## 2014-06-15 DIAGNOSIS — F411 Generalized anxiety disorder: Secondary | ICD-10-CM | POA: Diagnosis not present

## 2014-06-15 MED ORDER — PROMETHAZINE HCL 25 MG/ML IJ SOLN
25.0000 mg | Freq: Once | INTRAMUSCULAR | Status: AC
  Administered 2014-06-15: 25 mg via INTRAMUSCULAR
  Filled 2014-06-15: qty 1

## 2014-06-15 MED ORDER — MORPHINE SULFATE 10 MG/ML IJ SOLN
10.0000 mg | Freq: Once | INTRAMUSCULAR | Status: AC
  Administered 2014-06-15: 10 mg via INTRAMUSCULAR
  Filled 2014-06-15: qty 1

## 2014-06-15 NOTE — Discharge Instructions (Signed)
Apply Ice or Heat for comfort. Wear the binder at all times. Wound may drain blood through staples. Return here with fever, drainage of anything other than blood:  Clear fluid, pus.  Hematoma A hematoma is a collection of blood under the skin, in an organ, in a body space, in a joint space, or in other tissue. The blood can clot to form a lump that you can see and feel. The lump is often firm and may sometimes become sore and tender. Most hematomas get better in a few days to weeks. However, some hematomas may be serious and require medical care. Hematomas can range in size from very small to very large. CAUSES  A hematoma can be caused by a blunt or penetrating injury. It can also be caused by spontaneous leakage from a blood vessel under the skin. Spontaneous leakage from a blood vessel is more likely to occur in older people, especially those taking blood thinners. Sometimes, a hematoma can develop after certain medical procedures. SIGNS AND SYMPTOMS   A firm lump on the body.  Possible pain and tenderness in the area.  Bruising.Blue, dark blue, purple-red, or yellowish skin may appear at the site of the hematoma if the hematoma is close to the surface of the skin. For hematomas in deeper tissues or body spaces, the signs and symptoms may be subtle. For example, an intra-abdominal hematoma may cause abdominal pain, weakness, fainting, and shortness of breath. An intracranial hematoma may cause a headache or symptoms such as weakness, trouble speaking, or a change in consciousness. DIAGNOSIS  A hematoma can usually be diagnosed based on your medical history and a physical exam. Imaging tests may be needed if your health care provider suspects a hematoma in deeper tissues or body spaces, such as the abdomen, head, or chest. These tests may include ultrasonography or a CT scan.  TREATMENT  Hematomas usually go away on their own over time. Rarely does the blood need to be drained out of the body.  Large hematomas or those that may affect vital organs will sometimes need surgical drainage or monitoring. HOME CARE INSTRUCTIONS   Apply ice to the injured area:   Put ice in a plastic bag.   Place a towel between your skin and the bag.   Leave the ice on for 20 minutes, 2-3 times a day for the first 1 to 2 days.   After the first 2 days, switch to using warm compresses on the hematoma.   Elevate the injured area to help decrease pain and swelling. Wrapping the area with an elastic bandage may also be helpful. Compression helps to reduce swelling and promotes shrinking of the hematoma. Make sure the bandage is not wrapped too tight.   If your hematoma is on a lower extremity and is painful, crutches may be helpful for a couple days.   Only take over-the-counter or prescription medicines as directed by your health care provider. SEEK IMMEDIATE MEDICAL CARE IF:   You have increasing pain, or your pain is not controlled with medicine.   You have a fever.   You have worsening swelling or discoloration.   Your skin over the hematoma breaks or starts bleeding.   Your hematoma is in your chest or abdomen and you have weakness, shortness of breath, or a change in consciousness.  Your hematoma is on your scalp (caused by a fall or injury) and you have a worsening headache or a change in alertness or consciousness. MAKE SURE YOU:  Understand these instructions.  Will watch your condition.  Will get help right away if you are not doing well or get worse. Document Released: 05/24/2004 Document Revised: 06/12/2013 Document Reviewed: 03/20/2013 [facility] Patient Information 2015 [place], [place]. This information is not intended to replace advice given to you by your health care provider. Make sure you discuss any questions you have with your health care provider.

## 2014-06-15 NOTE — ED Notes (Signed)
Pt had a spinal cord stimulator implanted on Friday at [facility] by Dr. Maryjean Ka.  Pt is concerned with the amount of pain she is having as well as the swelling and the bleeding. Pt did not know how to get in touch with her surgeon.

## 2014-06-15 NOTE — ED Provider Notes (Signed)
CSN: [id number]     Arrival date & time 06/15/14  1512 History   This chart was scribed for Tanna Furry, MD by Randa Evens, ED Scribe. This patient was seen in room MH10/MH10 and the patient's care was started at 4:41 PM.    Chief Complaint  Patient presents with  . Wound Check   Patient is a 48 y.o. female presenting with wound check. The history is provided by the patient. No language interpreter was used.  Wound Check Pertinent negatives include no chest pain, no abdominal pain, no headaches and no shortness of breath.   HPI Comments: Maureen [name] is a 48 y.o. female who presents to the Emergency Department complaining of wound to her back from a spinal stimulant onset 3 days prior. She states she has been having associated nausea and dizziness.  She states that the pain, swelling and bleeding is gradually worsening. She states he has been applying ice with no relief. She states she has been taking percocet with no relief. She state she has been inactive post surgery. She state she has also been constipated due to the pain medications she is taking.     Past Medical History  Diagnosis Date  . Abdominal pain   . Cancer     MOLE PRE  . Arthritis   . Cerebrospinal fluid leak from spinal puncture 08/24/2012  . Raynaud's disease   . Lupus     takes Plaquenil daily  . Anxiety     takes Xanax daily as needed  . Constipation     takes Dulcolax and Miralax daily as needed;also Fiber Pills  . GERD (gastroesophageal reflux disease)     takes Omeprazole daily  . Muscle spasm     takes Robaxin daily as needed  . Depression     takes Lamictal daily  . Heart murmur     slight  . History of bronchitis     last time many yrs ago(63yrs ago)  . Pneumonia     hx of > 59yrs ago  . Headache(784.0)   . Numbness and tingling in hands   . Fibromyalgia   . Osteoarthritis   . Joint pain   . Joint swelling   . Chronic back pain   . Internal hemorrhoid   . Urinary frequency   . Urinary  urgency   . Interstitial cystitis     no problems since beginning of Jan 2014   Past Surgical History  Procedure Laterality Date  . Spinal fusion      x 2   . Abdominal hernia repair    . Cholecystectomy    . Uterine ablation    . Abdominal hysterectomy  8/12  . Tonsillectomy    . Cesarean section    . Shoulder surgery Right     x 2  . Esophagogastroduodenoscopy N/A 05/08/2013    Procedure: ESOPHAGOGASTRODUODENOSCOPY (EGD);  Surgeon: Rogene Houston, MD;  Location: [facility];  Service: Endoscopy;  Laterality: N/A;  315  . Colonoscopy    . Appendectomy    . Ovarian cyst removal    . Diagnostic laparoscopy      adhesions   Family History  Problem Relation Age of Onset  . Hyperlipidemia Mother   . Depression Mother   . Anxiety disorder Mother    History  Substance Use Topics  . Smoking status: Former Smoker -- 0.50 packs/day for 20 years    Types: Cigarettes    Quit date: 06/09/2008  . Smokeless  tobacco: Not on file  . Alcohol Use: No   OB History   Grav Para Term Preterm Abortions TAB SAB Ect Mult Living                 Review of Systems  Constitutional: Negative for fever, chills, diaphoresis, appetite change and fatigue.  HENT: Negative for mouth sores, sore throat and trouble swallowing.   Eyes: Negative for visual disturbance.  Respiratory: Negative for cough, chest tightness, shortness of breath and wheezing.   Cardiovascular: Negative for chest pain.  Gastrointestinal: Positive for nausea and constipation. Negative for vomiting, abdominal pain, diarrhea and abdominal distention.  Endocrine: Negative for polydipsia, polyphagia and polyuria.  Genitourinary: Negative for dysuria, frequency and hematuria.  Musculoskeletal: Negative for gait problem.  Skin: Positive for wound. Negative for color change, pallor and rash.  Neurological: Positive for dizziness. Negative for syncope, light-headedness and headaches.  Hematological: Does not bruise/bleed easily.   Psychiatric/Behavioral: Negative for behavioral problems and confusion.     Allergies  Carbamazepine and Penicillins  Home Medications   Prior to Admission medications   Medication Sig Start Date End Date Taking? Authorizing Provider  ALPRAZolam Duanne Moron) 0.5 MG tablet Take 0.5-2.5 mg by mouth 3 (three) times daily as needed for anxiety or sleep. Per Dr. Jonita Albee    Historical Provider, MD  azaTHIOprine (IMURAN) 50 MG tablet Take 125 mg by mouth daily.     Historical Provider, MD  cetirizine (ZYRTEC) 10 MG tablet Take 10 mg by mouth daily as needed for allergies.     Historical Provider, MD  clindamycin (CLEOCIN) 150 MG capsule Take 1 capsule (150 mg total) by mouth 3 (three) times daily. 06/13/14   Bonna Gains, MD  clindamycin (CLEOCIN) 150 MG capsule Take 1 capsule (150 mg total) by mouth 3 (three) times daily. 06/13/14   Bonna Gains, MD  desonide (DESOWEN) 0.05 % cream Apply 1 application topically daily as needed (to face). Apply to face once daily. 02/05/14 02/05/15  Historical Provider, MD  DULoxetine (CYMBALTA) 20 MG capsule Take 20 mg by mouth daily.    Historical Provider, MD  fluocinonide (LIDEX) 0.05 % external solution Apply twice daily as needed to scalp 01/22/14   Historical Provider, MD  gabapentin (NEURONTIN) 300 MG capsule Take 1,200 mg by mouth at bedtime.     Historical Provider, MD  HYDROcodone-acetaminophen (NORCO) 10-325 MG per tablet Take 1 tablet by mouth every 4 (four) hours as needed. Per Dr. Arnoldo Morale 06/13/14   Bonna Gains, MD  hydroxychloroquine (PLAQUENIL) 200 MG tablet Take 200 mg by mouth 2 (two) times daily.     Historical Provider, MD  lamoTRIgine (LAMICTAL) 25 MG tablet Take 75 mg by mouth every evening.    Historical Provider, MD  methocarbamol (ROBAXIN) 500 MG tablet Take 1 tablet (500 mg total) by mouth 4 (four) times daily as needed for muscle spasms. 02/18/14   Lysbeth Penner, FNP  Multiple Vitamin (MULTIVITAMIN WITH MINERALS) TABS tablet Take 1 tablet  by mouth daily.    Historical Provider, MD  omeprazole (PRILOSEC) 40 MG capsule Take 40 mg by mouth daily.    Historical Provider, MD  oxyCODONE-acetaminophen (PERCOCET) 10-325 MG per tablet Take 1 tablet by mouth every evening. Dr. Arnoldo Morale    Historical Provider, MD  polycarbophil (FIBERCON) 625 MG tablet Take 625 mg by mouth daily.    Historical Provider, MD  polyethylene glycol (MIRALAX / GLYCOLAX) packet Take 17 g by mouth daily.    Historical Provider, MD  thalidomide (THALOMID) 100 MG capsule Take 100 mg by mouth at bedtime. Take with water.    Historical Provider, MD   Triage Vitals: BP 103/64  Pulse 75  Temp(Src) 97.4 F (36.3 C) (Oral)  Resp 18  Ht 5\' 4"  (1.626 m)  Wt 160 lb (72.576 kg)  BMI 27.45 kg/m2  SpO2 99%  Physical Exam  Constitutional: She is oriented to person, place, and time. She appears well-developed and well-nourished. No distress.  HENT:  Head: Normocephalic.  Eyes: Conjunctivae are normal. Pupils are equal, round, and reactive to light. No scleral icterus.  Neck: Normal range of motion. Neck supple. No thyromegaly present.  Cardiovascular: Normal rate and regular rhythm.  Exam reveals no gallop and no friction rub.   No murmur heard. Pulmonary/Chest: Effort normal and breath sounds normal. No respiratory distress. She has no wheezes. She has no rales.  Abdominal: Soft. Bowel sounds are normal. She exhibits no distension. There is no tenderness. There is no rebound.  Musculoskeletal: Normal range of motion.       Back:  Neurological: She is alert and oriented to person, place, and time.  Skin: Skin is warm and dry. No rash noted.  4cm wound longitudinal to mid lumbar with palpable hematoma, staples intact, 4cm mid line vertical wound also intact, minimal surrounding ecchymosis   Psychiatric: She has a normal mood and affect. Her behavior is normal.    ED Course  Procedures (including critical care time) DIAGNOSTIC STUDIES: Oxygen Saturation is 99% on  RA, normal by my interpretation.    COORDINATION OF CARE: 4:50 PM-Discussed treatment plan which includes pain medication and abdominal binder with pt at bedside and pt agreed to plan.     Labs Review Labs Reviewed - No data to display  Imaging Review No results found.   EKG Interpretation None      MDM   Final diagnoses:  Postoperative wound hematoma        Medical screening examination/treatment/procedure(s) were performed by non-physician practitioner and as supervising physician I was immediately available for consultation/collaboration.   EKG Interpretation None           Tanna Furry, MD 06/15/14 1712

## 2014-06-15 NOTE — ED Notes (Signed)
No fever or chills since surgery

## 2014-06-16 ENCOUNTER — Encounter (HOSPITAL_COMMUNITY): Payer: Self-pay | Admitting: Anesthesiology

## 2014-06-16 NOTE — Anesthesia Postprocedure Evaluation (Signed)
Anesthesia Post Note  Patient: Maureen [name]  Procedure(s) Performed: Procedure(s) (LRB): LUMBAR SPINAL CORD STIMULATOR INSERTION (N/A)  Anesthesia type: MAC  Patient location: PACU  Post pain: Pain level controlled and Adequate analgesia  Post assessment: Post-op Vital signs reviewed, Patient's Cardiovascular Status Stable and Respiratory Function Stable  Last Vitals:  Filed Vitals:   06/13/14 1324  BP:   Pulse:   Temp: 36.2 C  Resp:     Post vital signs: Reviewed and stable  Level of consciousness: awake, alert  and oriented  Complications: No apparent anesthesia complications

## 2014-06-17 ENCOUNTER — Telehealth: Payer: Self-pay | Admitting: Family Medicine

## 2014-06-17 NOTE — Telephone Encounter (Signed)
Pt notified she will need to have Dr. Leitha Schuller office request labs since we did not order. Pt verbalized understanding.

## 2014-06-24 DIAGNOSIS — Z9889 Other specified postprocedural states: Secondary | ICD-10-CM | POA: Insufficient documentation

## 2014-06-27 IMAGING — CR DG LUMBAR SPINE 2-3V
2 series · 2 of 2 positions shown · non-contrast
Comparison: None.

CLINICAL DATA: Back pain.

EXAM:
LUMBAR SPINE - 2-3 VIEW

[view not recorded (1 of 2)]
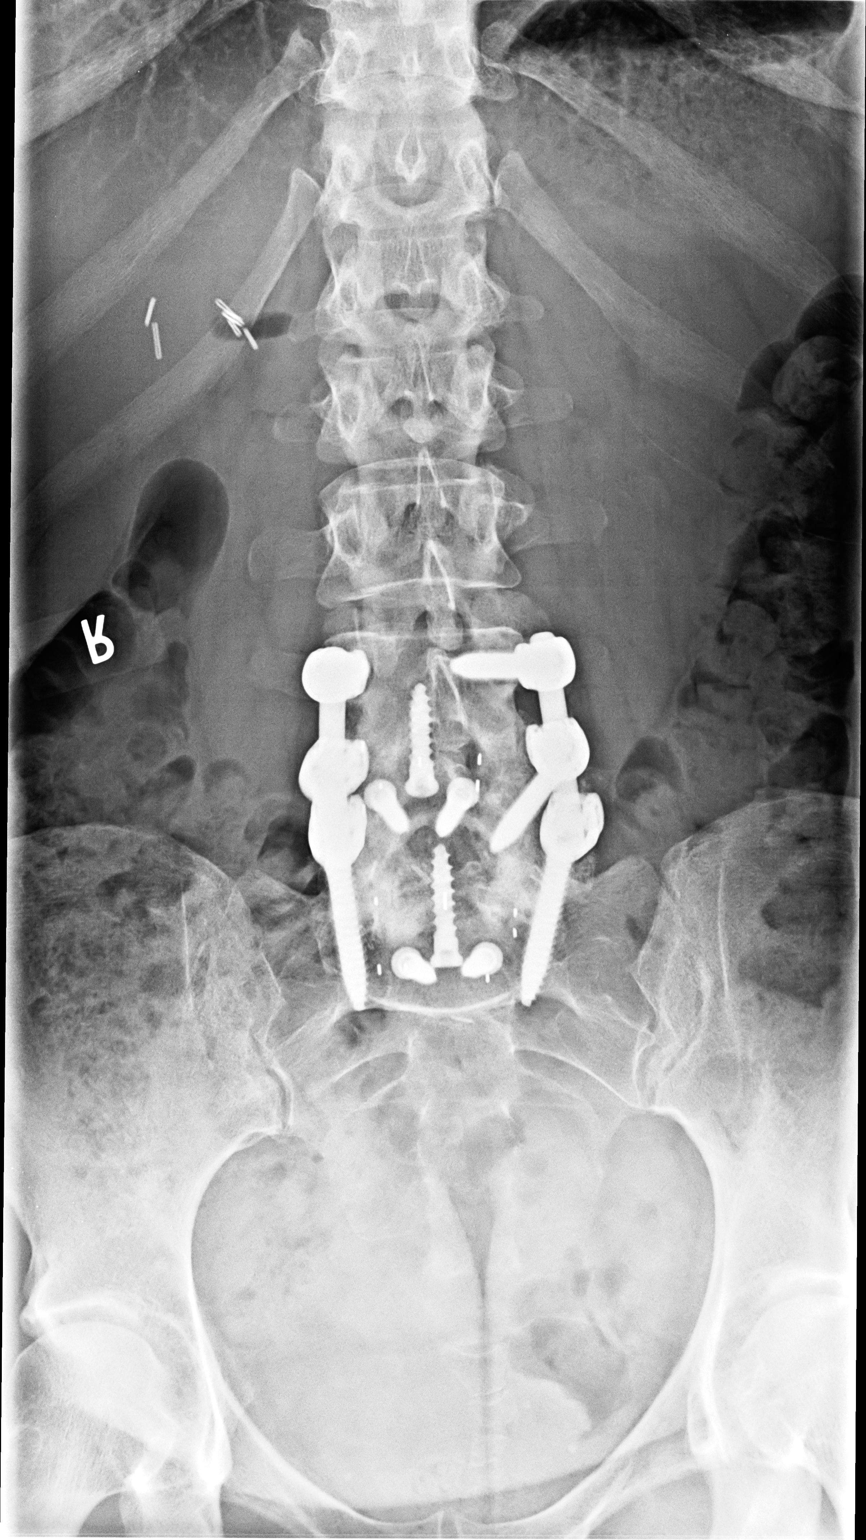

[view not recorded (2 of 2)]
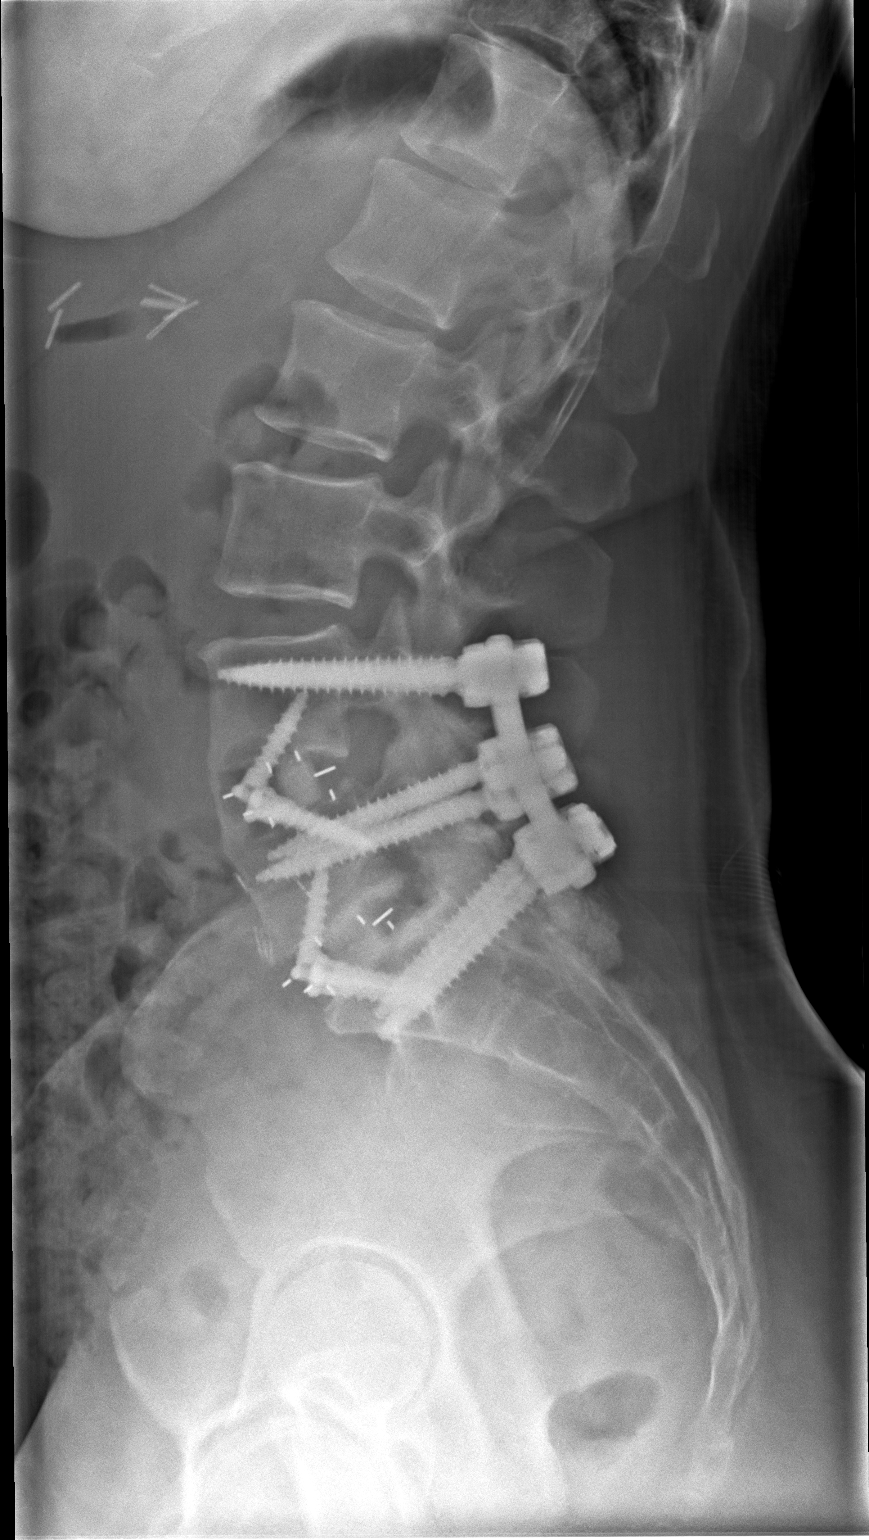

[2 of 2 positions shown; findings below may reference images not displayed]

FINDINGS: Patient is status post L4-S1 anterior and posterior interbody
fusion. Lucency overlying the anterior margin of the L4-5 screws is
most likely due to overlying bowel gas. Alignment is anatomic.
Vertebral body and disc space height are maintained. Surgical clips
in the right upper quadrant.
IMPRESSION: L4-S1 anterior and posterior lumbar interbody fusion without
complicating feature.

## 2014-08-06 ENCOUNTER — Ambulatory Visit: Payer: Medicaid Other | Admitting: Family

## 2014-08-12 ENCOUNTER — Other Ambulatory Visit (INDEPENDENT_AMBULATORY_CARE_PROVIDER_SITE_OTHER): Payer: Medicaid Other

## 2014-08-12 DIAGNOSIS — M359 Systemic involvement of connective tissue, unspecified: Secondary | ICD-10-CM

## 2014-08-12 DIAGNOSIS — Z79899 Other long term (current) drug therapy: Secondary | ICD-10-CM

## 2014-08-13 ENCOUNTER — Telehealth: Payer: Self-pay | Admitting: Family Medicine

## 2014-08-13 NOTE — Telephone Encounter (Signed)
Labs not back. Patient aware. She needs them sent to rheumotologist .

## 2014-08-14 LAB — CBC WITH DIFFERENTIAL
Basophils Absolute: 0 10*3/uL (ref 0.0–0.2)
Basos: 0 %
Eos: 0 %
Eosinophils Absolute: 0 10*3/uL (ref 0.0–0.4)
HCT: 36.8 % (ref 34.0–46.6)
Hemoglobin: 12.5 g/dL (ref 11.1–15.9)
Immature Grans (Abs): 0 10*3/uL (ref 0.0–0.1)
Immature Granulocytes: 0 %
Lymphocytes Absolute: 1 10*3/uL (ref 0.7–3.1)
Lymphs: 24 %
MCH: 33 pg (ref 26.6–33.0)
MCHC: 34 g/dL (ref 31.5–35.7)
MCV: 97 fL (ref 79–97)
Monocytes Absolute: 0.2 10*3/uL (ref 0.1–0.9)
Monocytes: 5 %
Neutrophils Absolute: 2.8 10*3/uL (ref 1.4–7.0)
Neutrophils Relative %: 71 %
Platelets: 311 10*3/uL (ref 150–379)
RBC: 3.79 x10E6/uL (ref 3.77–5.28)
RDW: 15.7 % — ABNORMAL HIGH (ref 12.3–15.4)
WBC: 4 10*3/uL (ref 3.4–10.8)

## 2014-08-14 LAB — CREATININE, SERUM
Creatinine, Ser: 0.63 mg/dL (ref 0.57–1.00)
GFR calc Af Amer: 124 mL/min/{1.73_m2} (ref >59)
GFR calc non Af Amer: 107 mL/min/{1.73_m2} (ref >59)

## 2014-08-14 LAB — HEPATIC FUNCTION PANEL
ALT: 26 IU/L (ref 0–32)
AST: 22 IU/L (ref 0–40)
Albumin: 4.6 g/dL (ref 3.5–5.5)
Alkaline Phosphatase: 38 IU/L — ABNORMAL LOW (ref 39–117)
Bilirubin, Direct: 0.07 mg/dL (ref 0.00–0.40)
Total Bilirubin: 0.3 mg/dL (ref 0.0–1.2)
Total Protein: 6.4 g/dL (ref 6.0–8.5)

## 2014-08-20 ENCOUNTER — Other Ambulatory Visit: Payer: Medicaid Other | Admitting: Family Medicine

## 2014-08-28 ENCOUNTER — Telehealth: Payer: Self-pay | Admitting: Family Medicine

## 2014-08-28 NOTE — Telephone Encounter (Signed)
Pt wanted to get in by the end of the month for her pap/physical, advised we only have one provider that does it, no openings prior to the end of the month, pt will try to get appt at her gyn in [place]

## 2014-09-04 ENCOUNTER — Telehealth: Payer: Self-pay | Admitting: Family Medicine

## 2014-09-04 NOTE — Telephone Encounter (Signed)
Appt given for tomorrow per patient

## 2014-09-05 ENCOUNTER — Ambulatory Visit (INDEPENDENT_AMBULATORY_CARE_PROVIDER_SITE_OTHER): Payer: Medicaid Other | Admitting: Family Medicine

## 2014-09-05 ENCOUNTER — Encounter: Payer: Self-pay | Admitting: Family Medicine

## 2014-09-05 VITALS — BP 123/80 | HR 81 | Temp 97.9°F | Ht 64.0 in

## 2014-09-05 DIAGNOSIS — R300 Dysuria: Secondary | ICD-10-CM

## 2014-09-05 DIAGNOSIS — N390 Urinary tract infection, site not specified: Secondary | ICD-10-CM

## 2014-09-05 DIAGNOSIS — R319 Hematuria, unspecified: Secondary | ICD-10-CM

## 2014-09-05 LAB — POCT URINALYSIS DIPSTICK
Bilirubin, UA: NEGATIVE
Glucose, UA: NEGATIVE
Ketones, UA: NEGATIVE
Nitrite, UA: NEGATIVE
Protein, UA: NEGATIVE
Spec Grav, UA: 1.01
Urobilinogen, UA: NEGATIVE
pH, UA: 6.5

## 2014-09-05 LAB — POCT UA - MICROSCOPIC ONLY
Bacteria, U Microscopic: NEGATIVE
Casts, Ur, LPF, POC: NEGATIVE
Crystals, Ur, HPF, POC: NEGATIVE
Mucus, UA: NEGATIVE
Yeast, UA: NEGATIVE

## 2014-09-05 MED ORDER — NITROFURANTOIN MONOHYD MACRO 100 MG PO CAPS: 100.0000 mg | ORAL_CAPSULE | Freq: Two times a day (BID) | ORAL | Status: DC

## 2014-09-05 MED ORDER — CIPROFLOXACIN HCL 500 MG PO TABS: 500.0000 mg | ORAL_TABLET | Freq: Two times a day (BID) | ORAL | Status: DC

## 2014-09-05 NOTE — Progress Notes (Signed)
   Subjective:    Patient ID: Maureen [name], female    DOB: 05-03-1966, 48 y.o.   MRN: [id number]  HPI Patient is here with c/o dysuria and urinary frequency.  Review of Systems  Constitutional: Negative for fever.  HENT: Negative for ear pain.   Eyes: Negative for discharge.  Respiratory: Negative for cough.   Cardiovascular: Negative for chest pain.  Gastrointestinal: Negative for abdominal distention.  Endocrine: Negative for polyuria.  Genitourinary: Negative for difficulty urinating.  Musculoskeletal: Negative for gait problem and neck pain.  Skin: Negative for color change and rash.  Neurological: Negative for speech difficulty and headaches.  Psychiatric/Behavioral: Negative for agitation.       Objective:    BP 123/80 mmHg  Pulse 81  Temp(Src) 97.9 F (36.6 C) (Oral)  Ht 5\' 4"  (1.626 m) Physical Exam  Constitutional: She is oriented to person, place, and time. She appears well-developed and well-nourished.  HENT:  Head: Normocephalic and atraumatic.  Mouth/Throat: Oropharynx is clear and moist.  Eyes: Pupils are equal, round, and reactive to light.  Neck: Normal range of motion. Neck supple.  Cardiovascular: Normal rate and regular rhythm.   No murmur heard. Pulmonary/Chest: Effort normal and breath sounds normal.  Abdominal: Soft. Bowel sounds are normal. There is no tenderness.  Neurological: She is alert and oriented to person, place, and time.  Skin: Skin is warm and dry.  Psychiatric: She has a normal mood and affect.    Results for orders placed or performed in visit on 09/05/14  POCT urinalysis dipstick  Result Value Ref Range   Color, UA yellow    Clarity, UA clear    Glucose, UA neg    Bilirubin, UA neg    Ketones, UA neg    Spec Grav, UA 1.010    Blood, UA trace    pH, UA 6.5    Protein, UA neg    Urobilinogen, UA negative    Nitrite, UA neg    Leukocytes, UA small (1+)   POCT UA - Microscopic Only  Result Value Ref Range   WBC, Ur,  HPF, POC 5-10    RBC, urine, microscopic 1-5    Bacteria, U Microscopic neg    Mucus, UA neg    Epithelial cells, urine per micros occ    Crystals, Ur, HPF, POC neg    Casts, Ur, LPF, POC neg    Yeast, UA neg         Assessment & Plan:     ICD-9-CM ICD-10-CM   1. Dysuria 788.1 R30.0 POCT urinalysis dipstick     POCT UA - Microscopic Only     ciprofloxacin (CIPRO) 500 MG tablet     Urine culture  2. Urinary tract infection with hematuria, site unspecified 599.0 N39.0 ciprofloxacin (CIPRO) 500 MG tablet    R31.9 Urine culture     Return if symptoms worsen or fail to improve.  Lysbeth Penner FNP

## 2014-09-08 ENCOUNTER — Other Ambulatory Visit: Payer: Self-pay | Admitting: Family Medicine

## 2014-09-08 ENCOUNTER — Telehealth: Payer: Self-pay | Admitting: Family Medicine

## 2014-09-08 LAB — URINE CULTURE

## 2014-09-08 MED ORDER — CIPROFLOXACIN HCL 500 MG PO TABS: 500.0000 mg | ORAL_TABLET | Freq: Two times a day (BID) | ORAL | Status: DC

## 2014-09-08 MED ORDER — PHENAZOPYRIDINE HCL 200 MG PO TABS: 200.0000 mg | ORAL_TABLET | Freq: Three times a day (TID) | ORAL | Status: DC | PRN

## 2014-09-08 NOTE — Telephone Encounter (Signed)
Change medicine 

## 2014-09-08 NOTE — Telephone Encounter (Signed)
Aware. 

## 2014-09-08 NOTE — Telephone Encounter (Signed)
Lets wait until ua cx is back before changing abx, I have sent in some pyridium to help with the sx's.

## 2014-09-13 ENCOUNTER — Encounter (HOSPITAL_COMMUNITY): Payer: Self-pay | Admitting: Emergency Medicine

## 2014-09-13 ENCOUNTER — Emergency Department (HOSPITAL_COMMUNITY)
Admission: EM | Admit: 2014-09-13 | Discharge: 2014-09-13 | Disposition: A | Discharge status: Home / Self Care | Payer: Medicaid Other | Attending: Emergency Medicine | Admitting: Emergency Medicine

## 2014-09-13 DIAGNOSIS — M797 Fibromyalgia: Secondary | ICD-10-CM | POA: Diagnosis not present

## 2014-09-13 DIAGNOSIS — Z792 Long term (current) use of antibiotics: Secondary | ICD-10-CM | POA: Insufficient documentation

## 2014-09-13 DIAGNOSIS — Z859 Personal history of malignant neoplasm, unspecified: Secondary | ICD-10-CM | POA: Diagnosis not present

## 2014-09-13 DIAGNOSIS — K219 Gastro-esophageal reflux disease without esophagitis: Secondary | ICD-10-CM | POA: Diagnosis not present

## 2014-09-13 DIAGNOSIS — G8929 Other chronic pain: Secondary | ICD-10-CM | POA: Diagnosis not present

## 2014-09-13 DIAGNOSIS — F329 Major depressive disorder, single episode, unspecified: Secondary | ICD-10-CM | POA: Insufficient documentation

## 2014-09-13 DIAGNOSIS — Z79899 Other long term (current) drug therapy: Secondary | ICD-10-CM | POA: Insufficient documentation

## 2014-09-13 DIAGNOSIS — N3010 Interstitial cystitis (chronic) without hematuria: Secondary | ICD-10-CM

## 2014-09-13 DIAGNOSIS — R011 Cardiac murmur, unspecified: Secondary | ICD-10-CM | POA: Insufficient documentation

## 2014-09-13 DIAGNOSIS — Z880 Allergy status to penicillin: Secondary | ICD-10-CM | POA: Insufficient documentation

## 2014-09-13 DIAGNOSIS — K5900 Constipation, unspecified: Secondary | ICD-10-CM | POA: Diagnosis not present

## 2014-09-13 DIAGNOSIS — R52 Pain, unspecified: Secondary | ICD-10-CM

## 2014-09-13 DIAGNOSIS — M1990 Unspecified osteoarthritis, unspecified site: Secondary | ICD-10-CM | POA: Diagnosis not present

## 2014-09-13 DIAGNOSIS — Z7952 Long term (current) use of systemic steroids: Secondary | ICD-10-CM | POA: Diagnosis not present

## 2014-09-13 DIAGNOSIS — Z8669 Personal history of other diseases of the nervous system and sense organs: Secondary | ICD-10-CM | POA: Insufficient documentation

## 2014-09-13 DIAGNOSIS — Z8709 Personal history of other diseases of the respiratory system: Secondary | ICD-10-CM | POA: Insufficient documentation

## 2014-09-13 DIAGNOSIS — Z87891 Personal history of nicotine dependence: Secondary | ICD-10-CM | POA: Diagnosis not present

## 2014-09-13 DIAGNOSIS — F419 Anxiety disorder, unspecified: Secondary | ICD-10-CM | POA: Diagnosis not present

## 2014-09-13 DIAGNOSIS — M3210 Systemic lupus erythematosus, organ or system involvement unspecified: Secondary | ICD-10-CM | POA: Diagnosis not present

## 2014-09-13 DIAGNOSIS — Z8701 Personal history of pneumonia (recurrent): Secondary | ICD-10-CM | POA: Insufficient documentation

## 2014-09-13 DIAGNOSIS — N390 Urinary tract infection, site not specified: Secondary | ICD-10-CM | POA: Diagnosis present

## 2014-09-13 LAB — URINALYSIS, ROUTINE W REFLEX MICROSCOPIC
Bilirubin Urine: NEGATIVE
GLUCOSE, UA: NEGATIVE mg/dL
Ketones, ur: NEGATIVE mg/dL
Nitrite: NEGATIVE
PROTEIN: NEGATIVE mg/dL
Specific Gravity, Urine: 1.01 (ref 1.005–1.030)
UROBILINOGEN UA: 0.2 mg/dL (ref 0.0–1.0)
pH: 6 (ref 5.0–8.0)

## 2014-09-13 LAB — URINE MICROSCOPIC-ADD ON

## 2014-09-13 NOTE — ED Provider Notes (Signed)
CSN: [id number]     Arrival date & time 09/13/14  1524 History   First MD Initiated Contact with Patient 09/13/14 1652     Chief Complaint  Patient presents with  . Urinary Tract Infection     (Consider location/radiation/quality/duration/timing/severity/associated sxs/prior Treatment) The history is provided by the patient and medical records. No language interpreter was used.     Maureen [name] is a 48 y.o. female  with a hx of arthritis, Raynaud's, Lupus, anxiety, depression, fibromyalgia, chronic back pain, interstitial cystitis presents to the Emergency Department complaining of gradual, persistent, dysuria, urinary frequency and urinary urgency onset a little over 1 week ago.  Pt reports that she has been taking cipro without relief.  Pt reports she is also having a lupus flare with face and body rashes, fatigue and joint pain.  NO aggravating or alleviating factors.  Pt denies fever, chills, headache, neck pain, chest pain, SOB, N/V/D, weakness, dizziness, syncope.  Pt reports she has a Hx of IC and this pain is very similar.  She reports the last flare of this was approx 1 year ago.  She reports she was initially followed by [facility] but they were not helping therefore she stopped going.  She reports she is already taking hydrocodone for her back pain.  Pt reports partial hysterectomy (uterus and cervix), appendectomy, cholecystectomy and back surgery with a frontal entrance.  Pt denies food intolerance, increasing abd pain, diarrhea or constipation.  Pt reports she has not been sexually active in > 3 years and denies vaginal pain or discharge.     Past Medical History  Diagnosis Date  . Abdominal pain   . Cancer     MOLE PRE  . Arthritis   . Cerebrospinal fluid leak from spinal puncture 08/24/2012  . Raynaud's disease   . Lupus     takes Plaquenil daily  . Anxiety     takes Xanax daily as needed  . Constipation     takes Dulcolax and Miralax daily as needed;also Fiber  Pills  . GERD (gastroesophageal reflux disease)     takes Omeprazole daily  . Muscle spasm     takes Robaxin daily as needed  . Depression     takes Lamictal daily  . Heart murmur     slight  . History of bronchitis     last time many yrs ago(44yrs ago)  . Pneumonia     hx of > 42yrs ago  . Headache(784.0)   . Numbness and tingling in hands   . Fibromyalgia   . Osteoarthritis   . Joint pain   . Joint swelling   . Chronic back pain   . Internal hemorrhoid   . Urinary frequency   . Urinary urgency   . Interstitial cystitis     no problems since beginning of Jan 2014   Past Surgical History  Procedure Laterality Date  . Spinal fusion      x 2   . Abdominal hernia repair    . Cholecystectomy    . Uterine ablation    . Abdominal hysterectomy  8/12  . Tonsillectomy    . Cesarean section    . Shoulder surgery Right     x 2  . Esophagogastroduodenoscopy N/A 05/08/2013    Procedure: ESOPHAGOGASTRODUODENOSCOPY (EGD);  Surgeon: Rogene Houston, MD;  Location: [facility];  Service: Endoscopy;  Laterality: N/A;  315  . Colonoscopy    . Appendectomy    . Ovarian cyst removal    .  Diagnostic laparoscopy      adhesions  . Spinal cord stimulator insertion N/A 06/13/2014    Procedure: LUMBAR SPINAL CORD STIMULATOR INSERTION;  Surgeon: Bonna Gains, MD;  Location: [facility];  Service: Neurosurgery;  Laterality: N/A;   Family History  Problem Relation Age of Onset  . Hyperlipidemia Mother   . Depression Mother   . Anxiety disorder Mother    History  Substance Use Topics  . Smoking status: Former Smoker -- 0.50 packs/day for 20 years    Types: Cigarettes    Quit date: 06/09/2008  . Smokeless tobacco: Not on file  . Alcohol Use: No   OB History    No data available     Review of Systems  Constitutional: Negative for fever, diaphoresis, appetite change, fatigue and unexpected weight change.  HENT: Negative for mouth sores.   Eyes: Negative for visual disturbance.   Respiratory: Negative for cough, chest tightness, shortness of breath and wheezing.   Cardiovascular: Negative for chest pain.  Gastrointestinal: Positive for abdominal pain (suprapubic abd pain). Negative for nausea, vomiting, diarrhea and constipation.  Endocrine: Negative for polydipsia, polyphagia and polyuria.  Genitourinary: Positive for dysuria, urgency and frequency. Negative for hematuria.  Musculoskeletal: Negative for back pain and neck stiffness.  Skin: Negative for rash.  Allergic/Immunologic: Negative for immunocompromised state.  Neurological: Negative for syncope, light-headedness and headaches.  Hematological: Does not bruise/bleed easily.  Psychiatric/Behavioral: Negative for sleep disturbance. The patient is not nervous/anxious.       Allergies  Carbamazepine and Penicillins  Home Medications   Prior to Admission medications   Medication Sig Start Date End Date Taking? Authorizing Provider  ALPRAZolam Duanne Moron) 0.5 MG tablet Take 0.5-2.5 mg by mouth 3 (three) times daily as needed for anxiety or sleep. Per Dr. Jonita Albee    Historical Provider, MD  azaTHIOprine (IMURAN) 50 MG tablet Take 125 mg by mouth daily.     Historical Provider, MD  cetirizine (ZYRTEC) 10 MG tablet Take 10 mg by mouth daily as needed for allergies.     Historical Provider, MD  ciprofloxacin (CIPRO) 500 MG tablet Take 1 tablet (500 mg total) by mouth 2 (two) times daily. 09/08/14   Lysbeth Penner, FNP  desonide (DESOWEN) 0.05 % cream Apply 1 application topically daily as needed (to face). Apply to face once daily. 02/05/14 02/05/15  Historical Provider, MD  DULoxetine (CYMBALTA) 20 MG capsule Take 20 mg by mouth daily.    Historical Provider, MD  fluocinonide (LIDEX) 0.05 % external solution Apply twice daily as needed to scalp 01/22/14   Historical Provider, MD  gabapentin (NEURONTIN) 300 MG capsule Take 1,200 mg by mouth at bedtime.     Historical Provider, MD  HYDROcodone-acetaminophen (NORCO)  10-325 MG per tablet Take 1 tablet by mouth every 4 (four) hours as needed. Per Dr. Arnoldo Morale 06/13/14   Bonna Gains, MD  hydroxychloroquine (PLAQUENIL) 200 MG tablet Take 200 mg by mouth 2 (two) times daily.     Historical Provider, MD  lamoTRIgine (LAMICTAL) 25 MG tablet Take 75 mg by mouth every evening.    Historical Provider, MD  methocarbamol (ROBAXIN) 500 MG tablet Take 1 tablet (500 mg total) by mouth 4 (four) times daily as needed for muscle spasms. 02/18/14   Lysbeth Penner, FNP  Multiple Vitamin (MULTIVITAMIN WITH MINERALS) TABS tablet Take 1 tablet by mouth daily.    Historical Provider, MD  nitrofurantoin, macrocrystal-monohydrate, (MACROBID) 100 MG capsule Take 1 capsule (100 mg total) by mouth  2 (two) times daily. 09/05/14   Lysbeth Penner, FNP  omeprazole (PRILOSEC) 40 MG capsule Take 40 mg by mouth daily.    Historical Provider, MD  oxyCODONE-acetaminophen (PERCOCET) 10-325 MG per tablet Take 1 tablet by mouth every evening. Dr. Arnoldo Morale    Historical Provider, MD  phenazopyridine (PYRIDIUM) 200 MG tablet Take 1 tablet (200 mg total) by mouth 3 (three) times daily as needed for pain. 09/08/14   Lysbeth Penner, FNP  polycarbophil (FIBERCON) 625 MG tablet Take 625 mg by mouth daily.    Historical Provider, MD  polyethylene glycol (MIRALAX / GLYCOLAX) packet Take 17 g by mouth daily.    Historical Provider, MD  predniSONE (DELTASONE) 5 MG tablet TAKE 1 TABLET (5 MG TOTAL) BY MOUTH DAILY. 08/21/14   Historical Provider, MD  thalidomide (THALOMID) 100 MG capsule Take 100 mg by mouth at bedtime. Take with water.    Historical Provider, MD   BP 128/79 mmHg  Pulse 81  Temp(Src) 98.1 F (36.7 C)  Resp 18  SpO2 99% Physical Exam  Constitutional: She appears well-developed and well-nourished. No distress.  Awake, alert, nontoxic appearance  HENT:  Head: Normocephalic and atraumatic.  Mouth/Throat: Oropharynx is clear and moist. No oropharyngeal exudate.  Eyes: Conjunctivae are  normal. No scleral icterus.  Neck: Normal range of motion. Neck supple.  Cardiovascular: Normal rate, regular rhythm and intact distal pulses.   Pulmonary/Chest: Effort normal and breath sounds normal. No respiratory distress. She has no wheezes.  Equal chest expansion  Abdominal: Soft. Bowel sounds are normal. She exhibits no distension and no mass. There is tenderness in the suprapubic area. There is no rebound, no guarding and no CVA tenderness.  Musculoskeletal: Normal range of motion. She exhibits no edema.  Neurological: She is alert.  Speech is clear and goal oriented Moves extremities without ataxia  Skin: Skin is warm and dry. She is not diaphoretic.  Psychiatric: She has a normal mood and affect.  Nursing note and vitals reviewed.   ED Course  Procedures (including critical care time) Labs Review Labs Reviewed  URINALYSIS, ROUTINE W REFLEX MICROSCOPIC - Abnormal; Notable for the following:    Hgb urine dipstick SMALL (*)    Leukocytes, UA TRACE (*)    All other components within normal limits  URINE MICROSCOPIC-ADD ON    Imaging Review No results found.   EKG Interpretation None      MDM   Final diagnoses:  Pain due to interstitial cystitis   [name] presents with persistent urinary symptoms, hx of IC and treatment of a UTI throughout the last week.  Record review shows pt with  25,000 - 50, 000 colonies of Enterococcus from urine culture on 09/05/14 that is susceptible to cipro.  She reports she has completed the course of this antibiotic.    [place] today is without evidence of UTI.  Urine culture sent. Pt ssx are consistent with interstitial cystitis flare.  She is already taking hydrocodone for chronic pain; will not give further pain control.  Pt abd is soft without rigidity, peritoneal signs or pain in other places than the suprapubic region.  Pt is without appendix and no Hx complaints consistent with SBO or enteritis.  Pt also with previous hysterectomy  and denies vaginal complaints.  Pt reports she is not following her dietary guidelines.  Pt is to follow-up with [facility] this week.    I have personally reviewed patient's vitals, nursing note and any pertinent labs or imaging.  I performed an undressed physical exam.    It has been determined that no acute conditions requiring further emergency intervention are present at this time. The patient/guardian have been advised of the diagnosis and plan. I reviewed all labs and imaging including any potential incidental findings. We have discussed signs and symptoms that warrant return to the ED and they are listed in the discharge instructions.    Vital signs are stable at discharge.   BP 128/79 mmHg  Pulse 81  Temp(Src) 98.1 F (36.7 C)  Resp 18  SpO2 99%        Abigail Butts, PA-C 09/13/14 [place], MD 09/13/14 1836

## 2014-09-13 NOTE — Discharge Instructions (Signed)
1. Medications: usual home medications 2. Treatment: rest, drink plenty of fluids, follow-your IC dietary restrictions 3. Follow Up: Please followup with your primary doctor in 2 days for discussion of your diagnoses and further evaluation after today's visit; if you do not have a primary care doctor use the resource guide provided to find one; Please return to the ER for vomiting, fevers, worsening abd pain.  Interstitial Cystitis Interstitial cystitis (IC) is a condition that results in discomfort or pain in the bladder and the surrounding pelvic region. The symptoms can be different from case to case and even in the same individual. People may experience:  Mild discomfort.  Pressure.  Tenderness.  Intense pain in the bladder and pelvic area. CAUSES  Because IC varies so much in symptoms and severity, people studying this disease believe it is not one but several diseases. Some caregivers use the term painful bladder syndrome (PBS) to describe cases with painful urinary symptoms. This may not meet the strictest definition of IC. The term IC / PBS includes all cases of urinary pain that cannot be connected to other causes, such as infection or urinary stones.  SYMPTOMS  Symptoms may include:  An urgent need to urinate.  A frequent need to urinate.  A combination of these symptoms. Pain may change in intensity as the bladder fills with urine or as it empties. Women's symptoms often get worse during menstruation. They may sometimes experience pain with vaginal intercourse. Some of the symptoms of IC / PBS seem like those of bacterial infection. Tests do not show infection. IC / PBS is far more common in women than in men.  DIAGNOSIS  The diagnosis of IC / PBS is based on:  Presence of pain related to the bladder, usually along with problems of frequency and urgency.  Not finding other diseases that could cause the symptoms.  Diagnostic tests that help rule out other diseases  include:  Urinalysis.  Urine culture.  Cystoscopy.  Biopsy of the bladder wall.  Distension of the bladder under anesthesia.  Urine cytology.  Laboratory examination of prostate secretions. A biopsy is a tissue sample that can be looked at under a microscope. Samples of the bladder and urethra may be removed during a cystoscopy. A biopsy helps rule out bladder cancer. TREATMENT  Scientists have not yet found a cure for IC / PBS. Patients with IC / PBS do not get better with antibiotic therapy. Caregivers cannot predict who will respond best to which treatment. Symptoms may disappear without explanation. Disappearing symptoms may coincide with an event such as a change in diet or treatment. Even when symptoms disappear, they may return after days, weeks, months, or years.  Because the causes of IC / PBS are unknown, current treatments are aimed at relieving symptoms. Many people are helped by one or a combination of the treatments. As researchers learn more about IC / PBS, the list of potential treatments will change. Patients should discuss their options with a caregiver. SURGERY  Surgery should be considered only if all available treatments have failed and the pain is disabling. Many approaches and techniques are used. Each approach has its own advantages and complications. Advantages and complications should be discussed with a urologist. Your caregiver may recommend consulting another urologist for a second opinion. Most caregivers are reluctant to operate because the outcome is unpredictable. Some people still have symptoms after surgery.  People considering surgery should discuss the potential risks and benefits, side effects, and long- and short-term complications  with their family, as well as with people who have already had the procedure. Surgery requires anesthesia, hospitalization, and in some cases weeks or months of recovery. As the complexity of the procedure increases, so do the  chances for complications and for failure. HOME CARE INSTRUCTIONS   All drugs, even those sold over the counter, have side effects. Patients should always consult a caregiver before using any drug for an extended amount of time. Only take over-the-counter or prescription medicines for pain, discomfort, or fever as directed by your caregiver.  Many patients feel that smoking makes their symptoms worse. How the by-products of tobacco that are excreted in the urine affect IC / PBS is unknown. Smoking is the major known cause of bladder cancer. One of the best things smokers can do for their bladder and their overall health is to quit.  Many patients feel that gentle stretching exercises help relieve IC / PBS symptoms.  Methods vary, but basically patients decide to empty their bladder at designated times and use relaxation techniques and distractions to keep to the schedule. Gradually, patients try to lengthen the time between scheduled voids. A diary in which to record voiding times is usually helpful in keeping track of progress. MAKE SURE YOU:   Understand these instructions.  Will watch your condition.  Will get help right away if you are not doing well or get worse. Document Released: 06/10/2004 Document Revised: 01/02/2012 Document Reviewed: 08/25/2008 [facility] Patient Information 2015 [place], [place]. This information is not intended to replace advice given to you by your health care provider. Make sure you discuss any questions you have with your health care provider.

## 2014-09-13 NOTE — ED Notes (Signed)
Pt not in room for assessment at this time 

## 2014-09-13 NOTE — ED Notes (Signed)
Pt discharge papers available; unable to locate patient

## 2014-09-13 NOTE — ED Notes (Signed)
Pt c/o UTI sx that pt sts is taking cipro for that seems to be getting more severe; pt sts frequency

## 2014-09-14 LAB — URINE CULTURE
Colony Count: NO GROWTH
Culture: NO GROWTH

## 2014-09-16 ENCOUNTER — Other Ambulatory Visit (INDEPENDENT_AMBULATORY_CARE_PROVIDER_SITE_OTHER): Payer: Medicaid Other

## 2014-09-16 DIAGNOSIS — Z79899 Other long term (current) drug therapy: Secondary | ICD-10-CM

## 2014-09-16 NOTE — Progress Notes (Signed)
Lab for dr. Phineas Douglas at [facility]

## 2014-09-17 LAB — CBC WITH DIFFERENTIAL
Basophils Absolute: 0 10*3/uL (ref 0.0–0.2)
Basos: 0 %
Eos: 1 %
Eosinophils Absolute: 0 10*3/uL (ref 0.0–0.4)
HCT: 35.3 % (ref 34.0–46.6)
Hemoglobin: 12.8 g/dL (ref 11.1–15.9)
Immature Grans (Abs): 0 10*3/uL (ref 0.0–0.1)
Immature Granulocytes: 0 %
Lymphocytes Absolute: 0.8 10*3/uL (ref 0.7–3.1)
Lymphs: 17 %
MCH: 33.5 pg — ABNORMAL HIGH (ref 26.6–33.0)
MCHC: 36.3 g/dL — ABNORMAL HIGH (ref 31.5–35.7)
MCV: 92 fL (ref 79–97)
Monocytes Absolute: 0.2 10*3/uL (ref 0.1–0.9)
Monocytes: 3 %
Neutrophils Absolute: 3.7 10*3/uL (ref 1.4–7.0)
Neutrophils Relative %: 79 %
Platelets: 289 10*3/uL (ref 150–379)
RBC: 3.82 x10E6/uL (ref 3.77–5.28)
RDW: 13.9 % (ref 12.3–15.4)
WBC: 4.7 10*3/uL (ref 3.4–10.8)

## 2014-11-25 DIAGNOSIS — N3010 Interstitial cystitis (chronic) without hematuria: Secondary | ICD-10-CM | POA: Insufficient documentation

## 2014-11-26 ENCOUNTER — Other Ambulatory Visit (INDEPENDENT_AMBULATORY_CARE_PROVIDER_SITE_OTHER): Payer: BLUE CROSS/BLUE SHIELD

## 2014-11-26 DIAGNOSIS — M359 Systemic involvement of connective tissue, unspecified: Secondary | ICD-10-CM

## 2014-11-26 DIAGNOSIS — Z79899 Other long term (current) drug therapy: Secondary | ICD-10-CM

## 2014-11-26 NOTE — Progress Notes (Signed)
Lab work for Dr Steva Ready

## 2014-11-27 ENCOUNTER — Telehealth: Payer: Self-pay | Admitting: Family Medicine

## 2014-11-27 LAB — CBC WITH DIFFERENTIAL
Basophils Absolute: 0 10*3/uL (ref 0.0–0.2)
Basos: 0 %
Eos: 0 %
Eosinophils Absolute: 0 10*3/uL (ref 0.0–0.4)
HCT: 34.8 % (ref 34.0–46.6)
Hemoglobin: 12.2 g/dL (ref 11.1–15.9)
Immature Grans (Abs): 0 10*3/uL (ref 0.0–0.1)
Immature Granulocytes: 0 %
Lymphocytes Absolute: 0.7 10*3/uL (ref 0.7–3.1)
Lymphs: 19 %
MCH: 33.2 pg — ABNORMAL HIGH (ref 26.6–33.0)
MCHC: 35.1 g/dL (ref 31.5–35.7)
MCV: 95 fL (ref 79–97)
Monocytes Absolute: 0.1 10*3/uL (ref 0.1–0.9)
Monocytes: 2 %
Neutrophils Absolute: 2.8 10*3/uL (ref 1.4–7.0)
Neutrophils Relative %: 79 %
RBC: 3.67 x10E6/uL — ABNORMAL LOW (ref 3.77–5.28)
RDW: 14.8 % (ref 12.3–15.4)
WBC: 3.5 10*3/uL (ref 3.4–10.8)

## 2014-11-27 LAB — HEPATIC FUNCTION PANEL
ALT: 30 IU/L (ref 0–32)
AST: 32 IU/L (ref 0–40)
Albumin: 4.4 g/dL (ref 3.5–5.5)
Alkaline Phosphatase: 47 IU/L (ref 39–117)
Bilirubin, Direct: 0.09 mg/dL (ref 0.00–0.40)
Total Bilirubin: <0.2 mg/dL (ref 0.0–1.2)
Total Protein: 6.3 g/dL (ref 6.0–8.5)

## 2014-11-27 LAB — SPECIMEN STATUS REPORT

## 2014-11-27 LAB — CREATINE

## 2014-11-27 NOTE — Telephone Encounter (Signed)
-----   Message from Lysbeth Penner, FNP sent at 11/27/2014  2:08 PM EST ----- Labs look ok

## 2014-11-29 LAB — CREATININE, SERUM
Creatinine, Ser: 0.6 mg/dL (ref 0.57–1.00)
GFR calc Af Amer: 126 mL/min/{1.73_m2} (ref >59)
GFR calc non Af Amer: 109 mL/min/{1.73_m2} (ref >59)

## 2014-11-29 LAB — SPECIMEN STATUS REPORT

## 2014-12-01 ENCOUNTER — Telehealth: Payer: Self-pay | Admitting: Family Medicine

## 2014-12-01 NOTE — Telephone Encounter (Signed)
Intermittent symptoms x 1 month.  appt scheduled for tomorrow.  Patient aware.

## 2014-12-02 ENCOUNTER — Ambulatory Visit (INDEPENDENT_AMBULATORY_CARE_PROVIDER_SITE_OTHER): Payer: BLUE CROSS/BLUE SHIELD | Admitting: Family Medicine

## 2014-12-02 ENCOUNTER — Encounter: Payer: Self-pay | Admitting: Family Medicine

## 2014-12-02 VITALS — BP 105/73 | HR 78 | Temp 97.7°F | Ht 64.0 in | Wt 145.0 lb

## 2014-12-02 DIAGNOSIS — N390 Urinary tract infection, site not specified: Secondary | ICD-10-CM

## 2014-12-02 DIAGNOSIS — R350 Frequency of micturition: Secondary | ICD-10-CM

## 2014-12-02 LAB — POCT URINALYSIS DIPSTICK
Bilirubin, UA: NEGATIVE
Glucose, UA: NEGATIVE
Ketones, UA: NEGATIVE
Leukocytes, UA: NEGATIVE
Nitrite, UA: NEGATIVE
Protein, UA: NEGATIVE
Spec Grav, UA: 1.01
Urobilinogen, UA: NEGATIVE
pH, UA: 5

## 2014-12-02 LAB — POCT UA - MICROSCOPIC ONLY
Bacteria, U Microscopic: NEGATIVE
Casts, Ur, LPF, POC: NEGATIVE
Crystals, Ur, HPF, POC: NEGATIVE
Mucus, UA: NEGATIVE
WBC, Ur, HPF, POC: NEGATIVE
Yeast, UA: NEGATIVE

## 2014-12-02 MED ORDER — PHENAZOPYRIDINE HCL 200 MG PO TABS: 200.0000 mg | ORAL_TABLET | Freq: Three times a day (TID) | ORAL | Status: DC | PRN

## 2014-12-02 MED ORDER — CIPROFLOXACIN HCL 500 MG PO TABS: 500.0000 mg | ORAL_TABLET | Freq: Two times a day (BID) | ORAL | Status: DC

## 2014-12-02 NOTE — Progress Notes (Signed)
   Subjective:    Patient ID: Maureen [name], female    DOB: 1965/12/12, 49 y.o.   MRN: [id number]  HPI Patient is here today for c/o pelvic discomfort and dysuria.  Review of Systems  Constitutional: Negative for fever.  HENT: Negative for ear pain.   Eyes: Negative for discharge.  Respiratory: Negative for cough.   Cardiovascular: Negative for chest pain.  Gastrointestinal: Negative for abdominal distention.  Endocrine: Negative for polyuria.  Genitourinary: Negative for difficulty urinating.  Musculoskeletal: Negative for gait problem and neck pain.  Skin: Negative for color change and rash.  Neurological: Negative for speech difficulty and headaches.  Psychiatric/Behavioral: Negative for agitation.       Objective:    BP 105/73 mmHg  Pulse 78  Temp(Src) 97.7 F (36.5 C) (Oral)  Ht 5\' 4"  (1.626 m)  Wt 145 lb (65.772 kg)  BMI 24.88 kg/m2 Physical Exam  Constitutional: She is oriented to person, place, and time. She appears well-developed and well-nourished.  HENT:  Head: Normocephalic and atraumatic.  Mouth/Throat: Oropharynx is clear and moist.  Eyes: Pupils are equal, round, and reactive to light.  Neck: Normal range of motion. Neck supple.  Cardiovascular: Normal rate and regular rhythm.   No murmur heard. Pulmonary/Chest: Effort normal and breath sounds normal.  Abdominal: Soft. Bowel sounds are normal. There is no tenderness.  Neurological: She is alert and oriented to person, place, and time.  Skin: Skin is warm and dry.  Psychiatric: She has a normal mood and affect.   Results for orders placed or performed in visit on 12/02/14  POCT urinalysis dipstick  Result Value Ref Range   Color, UA yellow    Clarity, UA clear    Glucose, UA neg    Bilirubin, UA neg    Ketones, UA neg    Spec Grav, UA 1.010    Blood, UA large    pH, UA 5.0    Protein, UA neg    Urobilinogen, UA negative    Nitrite, UA neg    Leukocytes, UA Negative   POCT UA - Microscopic  Only  Result Value Ref Range   WBC, Ur, HPF, POC neg    RBC, urine, microscopic 15-20    Bacteria, U Microscopic neg    Mucus, UA neg    Epithelial cells, urine per micros occ    Crystals, Ur, HPF, POC neg    Casts, Ur, LPF, POC neg    Yeast, UA neg          Assessment & Plan:     ICD-9-CM ICD-10-CM   1. Frequent urination 788.41 R35.0 POCT urinalysis dipstick     POCT UA - Microscopic Only     ciprofloxacin (CIPRO) 500 MG tablet     Urine culture     phenazopyridine (PYRIDIUM) 200 MG tablet  2. Urinary tract infection without hematuria, site unspecified 599.0 N39.0 ciprofloxacin (CIPRO) 500 MG tablet     Urine culture     phenazopyridine (PYRIDIUM) 200 MG tablet     No Follow-up on file.  Lysbeth Penner FNP

## 2014-12-03 ENCOUNTER — Other Ambulatory Visit: Payer: Self-pay | Admitting: Family Medicine

## 2014-12-03 ENCOUNTER — Telehealth: Payer: Self-pay | Admitting: Family Medicine

## 2014-12-03 DIAGNOSIS — N3090 Cystitis, unspecified without hematuria: Secondary | ICD-10-CM

## 2014-12-03 NOTE — Telephone Encounter (Signed)
Elveria stated yesterday that she was dx with Interstitial Cystitis which is difficult to tx.  She should see urology so I will put in urology referral.  I have also seen patients who have this problem take mylanta or maalox PO which makes Urine alkaline which can make the bladder wall less painful.  I will send in urology referral and she can follow up here as well if she is still hurting.

## 2014-12-03 NOTE — Telephone Encounter (Signed)
A lot worse than yesterday, worse back pain Abdominal pain, burning with urination    Has cystitis, and blood in urine.  Pyridium and antibiotic was given  Culture not back  Over whelmed right now, does not feel good at all

## 2014-12-03 NOTE — Telephone Encounter (Signed)
Stp and advised provider feedback, pt voiced understanding and will try the mylanta.

## 2014-12-04 LAB — URINE CULTURE: Organism ID, Bacteria: NO GROWTH

## 2015-01-26 ENCOUNTER — Other Ambulatory Visit (INDEPENDENT_AMBULATORY_CARE_PROVIDER_SITE_OTHER): Payer: BLUE CROSS/BLUE SHIELD

## 2015-01-26 DIAGNOSIS — Z79899 Other long term (current) drug therapy: Secondary | ICD-10-CM

## 2015-01-26 DIAGNOSIS — M359 Systemic involvement of connective tissue, unspecified: Secondary | ICD-10-CM

## 2015-01-26 NOTE — Progress Notes (Signed)
Lab orders for Dr Hassie Bruce fax [phone]

## 2015-01-27 LAB — CBC WITH DIFFERENTIAL/PLATELET
Basophils Absolute: 0 10*3/uL (ref 0.0–0.2)
Basos: 0 %
Eos: 0 %
Eosinophils Absolute: 0 10*3/uL (ref 0.0–0.4)
HCT: 37.4 % (ref 34.0–46.6)
HEMOGLOBIN: 13.2 g/dL (ref 11.1–15.9)
IMMATURE GRANS (ABS): 0 10*3/uL (ref 0.0–0.1)
IMMATURE GRANULOCYTES: 0 %
Lymphocytes Absolute: 0.7 10*3/uL (ref 0.7–3.1)
Lymphs: 19 %
MCH: 34.5 pg — AB (ref 26.6–33.0)
MCHC: 35.3 g/dL (ref 31.5–35.7)
MCV: 98 fL — AB (ref 79–97)
MONOS ABS: 0.1 10*3/uL (ref 0.1–0.9)
Monocytes: 3 %
NEUTROS PCT: 78 %
Neutrophils Absolute: 2.9 10*3/uL (ref 1.4–7.0)
PLATELETS: 255 10*3/uL (ref 150–379)
RBC: 3.83 x10E6/uL (ref 3.77–5.28)
RDW: 14.9 % (ref 12.3–15.4)
WBC: 3.7 10*3/uL (ref 3.4–10.8)

## 2015-01-27 LAB — HEPATIC FUNCTION PANEL
ALT: 23 IU/L (ref 0–32)
AST: 21 IU/L (ref 0–40)
Albumin: 5 g/dL (ref 3.5–5.5)
Alkaline Phosphatase: 51 IU/L (ref 39–117)
Bilirubin Total: 0.3 mg/dL (ref 0.0–1.2)
Bilirubin, Direct: 0.11 mg/dL (ref 0.00–0.40)
Total Protein: 7.1 g/dL (ref 6.0–8.5)

## 2015-01-27 LAB — CREATININE, SERUM
CREATININE: 0.62 mg/dL (ref 0.57–1.00)
GFR calc Af Amer: 123 mL/min/{1.73_m2} (ref >59)
GFR calc non Af Amer: 107 mL/min/{1.73_m2} (ref >59)

## 2015-02-13 ENCOUNTER — Ambulatory Visit (INDEPENDENT_AMBULATORY_CARE_PROVIDER_SITE_OTHER): Payer: BLUE CROSS/BLUE SHIELD | Admitting: Nurse Practitioner

## 2015-02-13 ENCOUNTER — Encounter: Payer: Self-pay | Admitting: Nurse Practitioner

## 2015-02-13 VITALS — BP 115/78 | HR 72 | Temp 98.0°F | Ht 64.0 in | Wt 146.8 lb

## 2015-02-13 DIAGNOSIS — N3010 Interstitial cystitis (chronic) without hematuria: Secondary | ICD-10-CM | POA: Diagnosis not present

## 2015-02-13 DIAGNOSIS — R350 Frequency of micturition: Secondary | ICD-10-CM

## 2015-02-13 LAB — POCT URINALYSIS DIPSTICK
Bilirubin, UA: NEGATIVE
Glucose, UA: NEGATIVE
Ketones, UA: NEGATIVE
Leukocytes, UA: NEGATIVE
NITRITE UA: NEGATIVE
PROTEIN UA: NEGATIVE
Spec Grav, UA: 1.015
UROBILINOGEN UA: NEGATIVE
pH, UA: 6.5

## 2015-02-13 LAB — POCT UA - MICROSCOPIC ONLY
BACTERIA, U MICROSCOPIC: NEGATIVE
CASTS, UR, LPF, POC: NEGATIVE
Crystals, Ur, HPF, POC: NEGATIVE
Mucus, UA: NEGATIVE
WBC, Ur, HPF, POC: NEGATIVE
Yeast, UA: NEGATIVE

## 2015-02-13 MED ORDER — CIPROFLOXACIN HCL 500 MG PO TABS: 500.0000 mg | ORAL_TABLET | Freq: Two times a day (BID) | ORAL | Status: DC

## 2015-02-13 NOTE — Progress Notes (Signed)
  Subjective:    Maureen [name] is a 49 y.o. female who complains of abnormal smelling urine, burning with urination, dysuria, foul smelling urine, frequency, nocturia and urgency. She has had symptoms for 2 days. Patient also complains of back pain and stomach ache. Patient denies fever and vaginal discharge. Patient does have a history of recurrent UTI. Patient does not have a history of pyelonephritis.   The following portions of the patient's history were reviewed and updated as appropriate: allergies, current medications, past family history, past medical history, past social history, past surgical history and problem list.  Review of Systems Pertinent items are noted in HPI.    Objective:    BP 115/78 mmHg  Pulse 72  Temp(Src) 98 F (36.7 C) (Oral)  Ht 5\' 4"  (1.626 m)  Wt 146 lb 12.8 oz (66.588 kg)  BMI 25.19 kg/m2 General appearance: alert and cooperative Lungs: clear to auscultation bilaterally Heart: regular rate and rhythm, S1, S2 normal, no murmur, click, rub or gallop Abdomen: soft, non-tender; bowel sounds normal; no masses,  no organomegaly  Laboratory:  Results for orders placed or performed in visit on 02/13/15  POCT urinalysis dipstick  Result Value Ref Range   Color, UA green    Clarity, UA clear    Glucose, UA neg    Bilirubin, UA neg    Ketones, UA neg    Spec Grav, UA 1.015    Blood, UA trace    pH, UA 6.5    Protein, UA neg    Urobilinogen, UA negative    Nitrite, UA neg    Leukocytes, UA Negative   POCT UA - Microscopic Only  Result Value Ref Range   WBC, Ur, HPF, POC neg    RBC, urine, microscopic occ    Bacteria, U Microscopic neg    Mucus, UA neg    Epithelial cells, urine per micros few    Crystals, Ur, HPF, POC neg    Casts, Ur, LPF, POC neg    Yeast, UA neg       Assessment:    Acute cystitis and interstual cystitis      Plan:   Take medication as prescribe Cotton underwear Take shower not bath Cranberry juice, yogurt Force  fluids AZO over the counter X2 days Culture pending RTO prn      Meds ordered this encounter  Medications  . Meth-Hyo-M Bl-Na Phos-Ph Sal (URIBEL) 118 MG CAPS    Sig:     Refill:  6  . ciprofloxacin (CIPRO) 500 MG tablet    Sig: Take 1 tablet (500 mg total) by mouth 2 (two) times daily.    Dispense:  6 tablet    Refill:  0    Order Specific Question:  Supervising Provider    Answer:  Chipper Herb [[id number]]   Mary-Margaret Hassell Done, FNP

## 2015-02-13 NOTE — Patient Instructions (Signed)

## 2015-02-18 ENCOUNTER — Ambulatory Visit (INDEPENDENT_AMBULATORY_CARE_PROVIDER_SITE_OTHER): Payer: BLUE CROSS/BLUE SHIELD | Admitting: Family Medicine

## 2015-02-18 ENCOUNTER — Encounter: Payer: Self-pay | Admitting: Family Medicine

## 2015-02-18 VITALS — BP 100/65 | HR 70 | Temp 97.6°F | Ht 64.0 in | Wt 146.8 lb

## 2015-02-18 DIAGNOSIS — M542 Cervicalgia: Secondary | ICD-10-CM | POA: Diagnosis not present

## 2015-02-18 DIAGNOSIS — R609 Edema, unspecified: Secondary | ICD-10-CM | POA: Diagnosis not present

## 2015-02-18 DIAGNOSIS — M069 Rheumatoid arthritis, unspecified: Secondary | ICD-10-CM

## 2015-02-18 NOTE — Progress Notes (Signed)
Subjective:  Patient ID: Maureen [name], female    DOB: [birth year]  Age: 49 y.o. MRN: [id number]  CC: Edema   HPI Maureen [name] presents for multiple myalgias and arthralgias widespread for several years. She is been under treatment with immune suppressants and pain meds all as outlined below she's had fair relief but now her legs feel tight like they are under pressure from the inside and she feels that she has a great deal of swelling in the legs. This has been increasing since onset 3 weeks ago. Pain is 7-8/10 from the so-called swelling. It is comparable to her other fibromyalgia and connective tissue disorder pain syndrome  History Maureen [name] has a past medical history of Abdominal pain; Cancer; Arthritis; Cerebrospinal fluid leak from spinal puncture (08/24/2012); Raynaud's disease; Lupus; Anxiety; Constipation; GERD (gastroesophageal reflux disease); Muscle spasm; Depression; Heart murmur; History of bronchitis; Pneumonia; Headache(784.0); Numbness and tingling in hands; Fibromyalgia; Osteoarthritis; Joint pain; Joint swelling; Chronic back pain; Internal hemorrhoid; Urinary frequency; Urinary urgency; and Interstitial cystitis.   She has past surgical history that includes Spinal fusion; Abdominal hernia repair; Cholecystectomy; uterine ablation; Abdominal hysterectomy (8/12); Tonsillectomy; Cesarean section; Shoulder surgery (Right); Esophagogastroduodenoscopy (N/A, 05/08/2013); Colonoscopy; Appendectomy; Ovarian cyst removal; Diagnostic laparoscopy; and Spinal cord stimulator insertion (N/A, 06/13/2014).   Her family history includes Anxiety disorder in her mother; Depression in her mother; Hyperlipidemia in her mother.She reports that she quit smoking about 6 years ago. Her smoking use included Cigarettes. She has a 10 pack-year smoking history. She does not have any smokeless tobacco history on file. She reports that she does not drink alcohol or use illicit drugs.  Current Outpatient  Prescriptions on File Prior to Visit  Medication Sig Dispense Refill  . ALPRAZolam (XANAX) 0.5 MG tablet Take 0.5-2.5 mg by mouth 3 (three) times daily as needed for anxiety or sleep. Per Dr. Hurley Cisco    . azaTHIOprine (IMURAN) 50 MG tablet Take 125 mg by mouth daily.     . cetirizine (ZYRTEC) 10 MG tablet Take 10 mg by mouth daily as needed for allergies.     . ciprofloxacin (CIPRO) 500 MG tablet Take 1 tablet (500 mg total) by mouth 2 (two) times daily. 6 tablet 0  . fluocinonide (LIDEX) 0.05 % external solution Apply twice daily as needed to scalp    . gabapentin (NEURONTIN) 300 MG capsule Take 1,200 mg by mouth at bedtime.     Marland Kitchen HYDROcodone-acetaminophen (NORCO) 10-325 MG per tablet Take 1 tablet by mouth every 4 (four) hours as needed. Per Dr. Lovell Sheehan 30 tablet 0  . hydroxychloroquine (PLAQUENIL) 200 MG tablet Take 200 mg by mouth 2 (two) times daily.     Marland Kitchen lamoTRIgine (LAMICTAL) 25 MG tablet Take 75 mg by mouth every evening.    . Meth-Hyo-M Bl-Na Phos-Ph Sal (URIBEL) 118 MG CAPS   6  . methocarbamol (ROBAXIN) 500 MG tablet Take 1 tablet (500 mg total) by mouth 4 (four) times daily as needed for muscle spasms. 60 tablet 5  . Multiple Vitamin (MULTIVITAMIN WITH MINERALS) TABS tablet Take 1 tablet by mouth daily.    Marland Kitchen omeprazole (PRILOSEC) 40 MG capsule Take 40 mg by mouth daily.    Marland Kitchen oxyCODONE-acetaminophen (PERCOCET) 10-325 MG per tablet Take 1 tablet by mouth every evening. Dr. Lovell Sheehan    . polycarbophil (FIBERCON) 625 MG tablet Take 625 mg by mouth daily.    . polyethylene glycol (MIRALAX / GLYCOLAX) packet Take 17 g by mouth daily.    Marland Kitchen  predniSONE (DELTASONE) 5 MG tablet TAKE 1 TABLET (5 MG TOTAL) BY MOUTH DAILY.     No current facility-administered medications on file prior to visit.    ROS Review of Systems  Constitutional: Negative for fever, chills, diaphoresis, appetite change, fatigue and unexpected weight change.  HENT: Negative for congestion, ear pain, hearing loss,  postnasal drip, rhinorrhea, sneezing, sore throat and trouble swallowing.   Eyes: Negative for pain.  Respiratory: Negative for cough, chest tightness and shortness of breath.   Cardiovascular: Negative for chest pain and palpitations.  Gastrointestinal: Negative for nausea, vomiting, abdominal pain, diarrhea and constipation.  Genitourinary: Negative for dysuria, frequency and menstrual problem.  Musculoskeletal: Positive for myalgias (Widespread over extremities and torso. Moderate to severe), joint swelling (In ankles) and arthralgias.  Skin: Negative for rash.  Neurological: Negative for dizziness, weakness, numbness and headaches.  Psychiatric/Behavioral: Negative for dysphoric mood and agitation.    Objective:  BP 100/65 mmHg  Pulse 70  Temp(Src) 97.6 F (36.4 C) (Oral)  Ht $R'5\' 4"'Lp$  (1.626 m)  Wt 146 lb 12.8 oz (66.588 kg)  BMI 25.19 kg/m2  BP Readings from Last 3 Encounters:  02/18/15 100/65  02/13/15 115/78  12/02/14 105/73    Wt Readings from Last 3 Encounters:  02/18/15 146 lb 12.8 oz (66.588 kg)  02/13/15 146 lb 12.8 oz (66.588 kg)  12/02/14 145 lb (65.772 kg)     Physical Exam  Constitutional: She is oriented to person, place, and time. She appears well-developed and well-nourished. No distress.  HENT:  Head: Normocephalic and atraumatic.  Right Ear: External ear normal.  Left Ear: External ear normal.  Nose: Nose normal.  Mouth/Throat: Oropharynx is clear and moist.  Eyes: Conjunctivae and EOM are normal. Pupils are equal, round, and reactive to light.  Neck: Normal range of motion. Neck supple. No thyromegaly present.  Cardiovascular: Normal rate, regular rhythm and normal heart sounds.   No murmur heard. Pulmonary/Chest: Effort normal and breath sounds normal. No respiratory distress. She has no wheezes. She has no rales.  Abdominal: Soft. Bowel sounds are normal. She exhibits no distension. There is no tenderness.  Musculoskeletal: She exhibits no edema  (No edema noted).  Lymphadenopathy:    She has no cervical adenopathy.  Neurological: She is alert and oriented to person, place, and time. She has normal reflexes.  Skin: Skin is warm and dry.  Psychiatric: She has a normal mood and affect. Her behavior is normal. Judgment and thought content normal.    No results found for: HGBA1C  Lab Results  Component Value Date   WBC 4.1 02/18/2015   HGB 13.2 01/26/2015   HCT 36.7 02/18/2015   PLT 255 01/26/2015   GLUCOSE 86 02/18/2015   ALT 28 02/18/2015   AST 24 02/18/2015   NA 139 02/18/2015   K 4.7 02/18/2015   CL 99 02/18/2015   CREATININE 0.56* 02/18/2015   BUN 9 02/18/2015   CO2 28 02/18/2015   TSH 1.000 02/18/2015   INR 0.99 01/30/2014    No results found.  Assessment & Plan:   Candiace was seen today for edema.  Diagnoses and all orders for this visit:  Edema Orders: -     CBC with Differential/Platelet -     CMP14+EGFR -     TSH  Cervicalgia  RA (rheumatoid arthritis)  Other orders -     triamterene-hydrochlorothiazide (MAXZIDE-25) 37.5-25 MG per tablet; Take 1 tablet by mouth daily. -     Specimen status report -  CBC with Differential/Platelet -     Specimen status report   I am having Ms. Agresti start on triamterene-hydrochlorothiazide. I am also having her maintain her oxyCODONE-acetaminophen, hydroxychloroquine, ALPRAZolam, azaTHIOprine, lamoTRIgine, gabapentin, polyethylene glycol, methocarbamol, fluocinonide, cetirizine, omeprazole, multivitamin with minerals, polycarbophil, HYDROcodone-acetaminophen, predniSONE, URIBEL, ciprofloxacin, and Quinacrine HCl.  Meds ordered this encounter  Medications  . Quinacrine HCl POWD    Sig: Take 100 mg by mouth.  . triamterene-hydrochlorothiazide (MAXZIDE-25) 37.5-25 MG per tablet    Sig: Take 1 tablet by mouth daily.    Dispense:  90 tablet    Refill:  3     Follow-up: Return in about 1 month (around 03/20/2015).  Claretta Fraise, M.D.

## 2015-02-19 ENCOUNTER — Telehealth: Payer: Self-pay | Admitting: Family Medicine

## 2015-02-19 MED ORDER — TRIAMTERENE-HCTZ 37.5-25 MG PO TABS: 1.0000 | ORAL_TABLET | Freq: Every day | ORAL | Status: DC

## 2015-02-19 NOTE — Telephone Encounter (Signed)
Please review and advise.

## 2015-02-19 NOTE — Telephone Encounter (Signed)
That is for fluid and has very mild effect on BP

## 2015-02-20 LAB — SPECIMEN STATUS REPORT

## 2015-02-20 LAB — CBC WITH DIFFERENTIAL/PLATELET
Basophils Absolute: 0 10*3/uL (ref 0.0–0.2)
Basos: 0 %
EOS (ABSOLUTE): 0 10*3/uL (ref 0.0–0.4)
Eos: 1 %
HEMOGLOBIN: 12.6 g/dL (ref 11.1–15.9)
Hematocrit: 36.7 % (ref 34.0–46.6)
IMMATURE GRANULOCYTES: 0 %
Immature Grans (Abs): 0 10*3/uL (ref 0.0–0.1)
Lymphocytes Absolute: 1.5 10*3/uL (ref 0.7–3.1)
Lymphs: 37 %
MCH: 34.5 pg — ABNORMAL HIGH (ref 26.6–33.0)
MCHC: 34.3 g/dL (ref 31.5–35.7)
MCV: 101 fL — AB (ref 79–97)
MONOCYTES: 9 %
Monocytes Absolute: 0.4 10*3/uL (ref 0.1–0.9)
Neutrophils Absolute: 2.1 10*3/uL (ref 1.4–7.0)
Neutrophils: 53 %
Platelets: 231 10*3/uL (ref 150–379)
RBC: 3.65 x10E6/uL — ABNORMAL LOW (ref 3.77–5.28)
RDW: 15.4 % (ref 12.3–15.4)
WBC: 4.1 10*3/uL (ref 3.4–10.8)

## 2015-02-20 LAB — CMP14+EGFR
ALBUMIN: 4.7 g/dL (ref 3.5–5.5)
ALT: 28 IU/L (ref 0–32)
AST: 24 IU/L (ref 0–40)
Albumin/Globulin Ratio: 2.2 (ref 1.1–2.5)
Alkaline Phosphatase: 43 IU/L (ref 39–117)
BUN / CREAT RATIO: 16 (ref 9–23)
BUN: 9 mg/dL (ref 6–24)
Bilirubin Total: 0.2 mg/dL (ref 0.0–1.2)
CHLORIDE: 99 mmol/L (ref 97–108)
CO2: 28 mmol/L (ref 18–29)
Calcium: 9.3 mg/dL (ref 8.7–10.2)
Creatinine, Ser: 0.56 mg/dL — ABNORMAL LOW (ref 0.57–1.00)
GFR calc Af Amer: 128 mL/min/{1.73_m2} (ref >59)
GFR calc non Af Amer: 111 mL/min/{1.73_m2} (ref >59)
GLUCOSE: 86 mg/dL (ref 65–99)
Globulin, Total: 2.1 g/dL (ref 1.5–4.5)
Potassium: 4.7 mmol/L (ref 3.5–5.2)
Sodium: 139 mmol/L (ref 134–144)
TOTAL PROTEIN: 6.8 g/dL (ref 6.0–8.5)

## 2015-02-20 LAB — TSH: TSH: 1 u[IU]/mL (ref 0.450–4.500)

## 2015-02-20 NOTE — Telephone Encounter (Signed)
Pt notified of results  Also explained reason for med  Pt verbalizes understanding

## 2015-03-30 ENCOUNTER — Encounter: Payer: Self-pay | Admitting: Family

## 2015-03-30 ENCOUNTER — Ambulatory Visit (INDEPENDENT_AMBULATORY_CARE_PROVIDER_SITE_OTHER): Payer: 59 | Admitting: Family

## 2015-03-30 VITALS — BP 128/84 | HR 82 | Temp 97.6°F | Ht 64.0 in | Wt 146.0 lb

## 2015-03-30 DIAGNOSIS — R51 Headache: Secondary | ICD-10-CM | POA: Diagnosis not present

## 2015-03-30 DIAGNOSIS — M359 Systemic involvement of connective tissue, unspecified: Secondary | ICD-10-CM

## 2015-03-30 DIAGNOSIS — R519 Headache, unspecified: Secondary | ICD-10-CM

## 2015-03-30 NOTE — Patient Instructions (Signed)
Lupus Lupus (also called systemic lupus erythematosus, SLE) is a disorder of the body's natural defense system (immune system). In lupus, the immune system attacks various areas of the body (autoimmune disease). CAUSES The cause is unknown. However, lupus runs in families. Certain genes can make you more likely to develop lupus. It is 10 times more common in women than in men. Lupus is also more common in African Americans and Asians. Other factors also play a role, such as viruses (Epstein-Barr virus, EBV), stress, hormones, cigarette smoke, and certain drugs. SYMPTOMS Lupus can affect many parts of the body, including the joints, skin, kidneys, lungs, heart, nervous system, and blood vessels. The signs and symptoms of lupus differ from person to person. The disease can range from mild to life-threatening. Typical features of lupus include:  Butterfly-shaped rash over the face.  Arthritis involving one or more joints.  Kidney disease.  Fever, weight loss, hair loss, fatigue.  Poor circulation in the fingers and toes (Raynaud's disease).  Chest pain when taking deep breaths. Abdominal pain may also occur.  Skin rash in areas exposed to the sun.  Sores in the mouth and nose. DIAGNOSIS Diagnosing lupus can take a long time and is often difficult. An exam and an accurate account of your symptoms and health problems is very important. Blood tests are necessary, though no single test can confirm or rule out lupus. Most people with lupus test positive for antinuclear antibodies (ANA) on a blood test. Additional blood tests, a urine test (urinalysis), and sometimes a kidney or skin tissue sample (biopsy) can help to confirm or rule out lupus. TREATMENT There is no cure for lupus. Your caregiver will develop a treatment plan based on your age, sex, health, symptoms, and lifestyle. The goals are to prevent flares, to treat them when they do occur, and to minimize organ damage and complications. How  the disease may affect each person varies widely. Most people with lupus can live normal lives, but this disorder must be carefully monitored. Treatment must be adjusted as necessary to prevent serious complications. Medicines used for treatment:  Nonsteroidal anti-inflammatory drugs (NSAIDs) decrease inflammation and can help with chest pain, joint pain, and fevers. Examples include ibuprofen and naproxen.  Antimalarial drugs were designed to treat malaria. They also treat fatigue, joint pain, skin rashes, and inflammation of the lungs in patients with lupus.  Corticosteroids are powerful hormones that rapidly suppress inflammation. The lowest dose with the highest benefit will be chosen. They can be given by cream, pills, injections, and through the vein (intravenously).  Immunosuppressive drugs block the making of immune cells. They may be used for kidney or nerve disease. HOME CARE INSTRUCTIONS  Exercise. Low-impact activities can usually help keep joints flexible without being too strenuous.  Rest after periods of exercise.  Avoid excessive sun exposure.  Follow proper nutrition and take supplements as recommended by your caregiver.  Stress management can be helpful. SEEK MEDICAL CARE IF:  You have increased fatigue.  You develop pain.  You develop a rash.  You have an oral temperature above 102 F (38.9 C).  You develop abdominal discomfort.  You develop a headache.  You experience dizziness. FOR MORE INFORMATION National Institute of Neurological Disorders and Stroke: www.ninds.nih.gov American College of Rheumatology: www.rheumatology.org National Institute of Arthritis and Musculoskeletal and Skin Diseases: www.niams.nih.gov Document Released: 09/30/2002 Document Revised: 01/02/2012 Document Reviewed: 01/21/2010 ExitCare Patient Information 2015 [employer]. This information is not intended to replace advice given to you by your health   care provider. Make sure  you discuss any questions you have with your health care provider.  

## 2015-03-30 NOTE — Progress Notes (Signed)
   Subjective:    Patient ID: Maureen [name], female    DOB: June 01, 1966, 49 y.o.   MRN: [id number]  HPI Pt presents to the office today for scalp tenderness, swelling, and hair loss. Pt states she has been diagnosed with lupus 11/2012 and had this scalp tenderness last year. Pt was seen in  [place], but has a new job and can make it to [facility] any more. Pt has appointment with them on 04/10/15   Pt states she started a new job about 8 weeks and with the stress her scalp became inflamed.    Review of Systems  Constitutional: Negative.   HENT: Negative.   Eyes: Negative.   Respiratory: Negative.  Negative for shortness of breath.   Cardiovascular: Negative.  Negative for palpitations.  Gastrointestinal: Negative.   Endocrine: Negative.   Genitourinary: Negative.   Musculoskeletal: Negative.   Neurological: Negative.  Negative for headaches.  Hematological: Negative.   Psychiatric/Behavioral: Negative.   All other systems reviewed and are negative.      Objective:   Physical Exam  Constitutional: She is oriented to person, place, and time. She appears well-developed and well-nourished. No distress.  HENT:  Head: Normocephalic and atraumatic.  Eyes: Pupils are equal, round, and reactive to light.  Neck: Normal range of motion. Neck supple. No thyromegaly present.  Cardiovascular: Normal rate, regular rhythm, normal heart sounds and intact distal pulses.   No murmur heard. Pulmonary/Chest: Effort normal and breath sounds normal. No respiratory distress. She has no wheezes.  Abdominal: Soft. Bowel sounds are normal. She exhibits no distension. There is no tenderness.  Musculoskeletal: Normal range of motion. She exhibits tenderness. She exhibits no edema.  Tenderness with Palpation at crown on head- no redness, warmth or swelling noted  Neurological: She is alert and oriented to person, place, and time. She has normal reflexes. No cranial nerve deficit.  Skin:  Skin is warm and dry.  Psychiatric: She has a normal mood and affect. Her behavior is normal. Judgment and thought content normal.  Vitals reviewed.     BP 128/84 mmHg  Pulse 82  Temp(Src) 97.6 F (36.4 C) (Oral)  Ht 5\' 4"  (1.626 m)  Wt 146 lb (66.225 kg)  BMI 25.05 kg/m2     Assessment & Plan:  1. Diffuse disease of connective tissue - CBC with Differential/Platelet - Hepatic function panel - Ambulatory referral to Dermatology  2. Scalp tenderness - Ambulatory referral to Dermatology  Pt on steroids currently Keep all rheumatologists appointments  Evelina Dun, FNP

## 2015-03-31 ENCOUNTER — Telehealth: Payer: Self-pay | Admitting: Family

## 2015-03-31 NOTE — Telephone Encounter (Signed)
Please review and advise.

## 2015-03-31 NOTE — Telephone Encounter (Signed)
Pt needs to follow up with dermatologists. HCTZ will not help with scalp swelling. Pt's BP was WNL and a "fluid pill" can be hard on the kidneys if not needed.

## 2015-04-01 LAB — CBC WITH DIFFERENTIAL/PLATELET
Basophils Absolute: 0 10*3/uL (ref 0.0–0.2)
Basos: 0 %
EOS (ABSOLUTE): 0 10*3/uL (ref 0.0–0.4)
Eos: 1 %
HEMOGLOBIN: 12 g/dL (ref 11.1–15.9)
Hematocrit: 34.8 % (ref 34.0–46.6)
IMMATURE GRANS (ABS): 0 10*3/uL (ref 0.0–0.1)
Immature Granulocytes: 0 %
LYMPHS: 33 %
Lymphocytes Absolute: 1.3 10*3/uL (ref 0.7–3.1)
MCH: 35.2 pg — AB (ref 26.6–33.0)
MCHC: 34.5 g/dL (ref 31.5–35.7)
MCV: 102 fL — AB (ref 79–97)
MONOCYTES: 7 %
Monocytes Absolute: 0.3 10*3/uL (ref 0.1–0.9)
NEUTROS ABS: 2.3 10*3/uL (ref 1.4–7.0)
Neutrophils: 59 %
Platelets: 230 10*3/uL (ref 150–379)
RBC: 3.41 x10E6/uL — ABNORMAL LOW (ref 3.77–5.28)
RDW: 15.8 % — AB (ref 12.3–15.4)
WBC: 3.9 10*3/uL (ref 3.4–10.8)

## 2015-04-01 LAB — HEPATIC FUNCTION PANEL
ALT: 18 IU/L (ref 0–32)
AST: 13 IU/L (ref 0–40)
Albumin: 4.3 g/dL (ref 3.5–5.5)
Alkaline Phosphatase: 40 IU/L (ref 39–117)
BILIRUBIN TOTAL: 0.2 mg/dL (ref 0.0–1.2)
BILIRUBIN, DIRECT: 0.08 mg/dL (ref 0.00–0.40)
Total Protein: 6.1 g/dL (ref 6.0–8.5)

## 2015-04-01 MED ORDER — HYDROCHLOROTHIAZIDE 25 MG PO TABS: 25.0000 mg | ORAL_TABLET | Freq: Every day | ORAL | Status: DC

## 2015-04-01 NOTE — Telephone Encounter (Signed)
Patient is already taking a fluid pill/ bp med along with her prednisone. Patient states she was put on it to help with her potassium. States that her BP is low and wants to know if she can just get the fluid pill seperately. She is still having a lot of swelling. Please advise

## 2015-04-01 NOTE — Telephone Encounter (Signed)
Left detailed message on voicemail per patients request that rx sent in to pharmacy

## 2015-04-01 NOTE — Telephone Encounter (Signed)
Maxzide d/c and sent new rx of HCTZ 25 mg to pharmacy per pt's request

## 2015-05-11 ENCOUNTER — Other Ambulatory Visit: Payer: Self-pay | Admitting: Nurse Practitioner

## 2015-05-25 ENCOUNTER — Other Ambulatory Visit: Payer: 59

## 2015-05-27 LAB — HEPATIC FUNCTION PANEL
ALK PHOS: 41 IU/L (ref 39–117)
ALT: 21 IU/L (ref 0–32)
AST: 23 IU/L (ref 0–40)
Albumin: 4.4 g/dL (ref 3.5–5.5)
BILIRUBIN TOTAL: 0.3 mg/dL (ref 0.0–1.2)
BILIRUBIN, DIRECT: 0.09 mg/dL (ref 0.00–0.40)
Total Protein: 6.3 g/dL (ref 6.0–8.5)

## 2015-05-27 LAB — CBC WITH DIFFERENTIAL/PLATELET
BASOS ABS: 0 10*3/uL (ref 0.0–0.2)
BASOS: 0 %
EOS (ABSOLUTE): 0 10*3/uL (ref 0.0–0.4)
EOS: 1 %
Hematocrit: 35.1 % (ref 34.0–46.6)
Hemoglobin: 12.4 g/dL (ref 11.1–15.9)
IMMATURE GRANULOCYTES: 0 %
Immature Grans (Abs): 0 10*3/uL (ref 0.0–0.1)
LYMPHS ABS: 1.2 10*3/uL (ref 0.7–3.1)
Lymphs: 33 %
MCH: 35.2 pg — AB (ref 26.6–33.0)
MCHC: 35.3 g/dL (ref 31.5–35.7)
MCV: 100 fL — AB (ref 79–97)
MONOS ABS: 0.2 10*3/uL (ref 0.1–0.9)
Monocytes: 7 %
NEUTROS PCT: 59 %
Neutrophils Absolute: 2.1 10*3/uL (ref 1.4–7.0)
PLATELETS: 221 10*3/uL (ref 150–379)
RBC: 3.52 x10E6/uL — AB (ref 3.77–5.28)
RDW: 14.5 % (ref 12.3–15.4)
WBC: 3.6 10*3/uL (ref 3.4–10.8)

## 2015-07-27 ENCOUNTER — Other Ambulatory Visit (INDEPENDENT_AMBULATORY_CARE_PROVIDER_SITE_OTHER): Payer: 59

## 2015-07-27 DIAGNOSIS — M329 Systemic lupus erythematosus, unspecified: Secondary | ICD-10-CM

## 2015-07-27 DIAGNOSIS — IMO0002 Reserved for concepts with insufficient information to code with codable children: Secondary | ICD-10-CM

## 2015-07-29 LAB — HEPATIC FUNCTION PANEL
ALT: 30 IU/L (ref 0–32)
AST: 21 IU/L (ref 0–40)
Albumin: 4 g/dL (ref 3.5–5.5)
Alkaline Phosphatase: 42 IU/L (ref 39–117)
BILIRUBIN, DIRECT: 0.06 mg/dL (ref 0.00–0.40)
TOTAL PROTEIN: 5.8 g/dL — AB (ref 6.0–8.5)

## 2015-07-29 LAB — CBC WITH DIFFERENTIAL/PLATELET
BASOS ABS: 0 10*3/uL (ref 0.0–0.2)
BASOS: 0 %
EOS (ABSOLUTE): 0 10*3/uL (ref 0.0–0.4)
Eos: 1 %
HEMOGLOBIN: 12 g/dL (ref 11.1–15.9)
Hematocrit: 34.5 % (ref 34.0–46.6)
IMMATURE GRANS (ABS): 0 10*3/uL (ref 0.0–0.1)
IMMATURE GRANULOCYTES: 0 %
LYMPHS: 29 %
Lymphocytes Absolute: 1.1 10*3/uL (ref 0.7–3.1)
MCH: 35.3 pg — ABNORMAL HIGH (ref 26.6–33.0)
MCHC: 34.8 g/dL (ref 31.5–35.7)
MCV: 102 fL — ABNORMAL HIGH (ref 79–97)
MONOCYTES: 7 %
Monocytes Absolute: 0.3 10*3/uL (ref 0.1–0.9)
NEUTROS PCT: 63 %
Neutrophils Absolute: 2.3 10*3/uL (ref 1.4–7.0)
Platelets: 227 10*3/uL (ref 150–379)
RBC: 3.4 x10E6/uL — ABNORMAL LOW (ref 3.77–5.28)
RDW: 14 % (ref 12.3–15.4)
WBC: 3.7 10*3/uL (ref 3.4–10.8)

## 2015-08-07 ENCOUNTER — Ambulatory Visit (INDEPENDENT_AMBULATORY_CARE_PROVIDER_SITE_OTHER): Payer: 59 | Admitting: Pediatrics

## 2015-08-07 ENCOUNTER — Encounter: Payer: Self-pay | Admitting: Pediatrics

## 2015-08-07 VITALS — BP 126/81 | HR 69 | Temp 97.3°F | Ht 64.0 in | Wt 148.6 lb

## 2015-08-07 DIAGNOSIS — R112 Nausea with vomiting, unspecified: Secondary | ICD-10-CM | POA: Diagnosis not present

## 2015-08-07 DIAGNOSIS — M542 Cervicalgia: Secondary | ICD-10-CM

## 2015-08-07 DIAGNOSIS — Z780 Asymptomatic menopausal state: Secondary | ICD-10-CM

## 2015-08-07 DIAGNOSIS — R399 Unspecified symptoms and signs involving the genitourinary system: Secondary | ICD-10-CM | POA: Diagnosis not present

## 2015-08-07 DIAGNOSIS — K5900 Constipation, unspecified: Secondary | ICD-10-CM | POA: Diagnosis not present

## 2015-08-07 LAB — POCT URINALYSIS DIPSTICK
Bilirubin, UA: NEGATIVE
Glucose, UA: NEGATIVE
Ketones, UA: NEGATIVE
NITRITE UA: NEGATIVE
PROTEIN UA: NEGATIVE
UROBILINOGEN UA: NEGATIVE
pH, UA: 7

## 2015-08-07 LAB — POCT UA - MICROSCOPIC ONLY
Casts, Ur, LPF, POC: NEGATIVE
Crystals, Ur, HPF, POC: NEGATIVE
MUCUS UA: NEGATIVE
Yeast, UA: NEGATIVE

## 2015-08-07 MED ORDER — POLYETHYLENE GLYCOL 3350 17 G PO PACK: 17.0000 g | PACK | Freq: Every day | ORAL | Status: DC

## 2015-08-07 MED ORDER — METHOCARBAMOL 500 MG PO TABS: 500.0000 mg | ORAL_TABLET | Freq: Four times a day (QID) | ORAL | Status: DC | PRN

## 2015-08-07 MED ORDER — ONDANSETRON 4 MG PO TBDP: 4.0000 mg | ORAL_TABLET | Freq: Three times a day (TID) | ORAL | Status: DC | PRN

## 2015-08-07 NOTE — Progress Notes (Signed)
Subjective:    Patient ID: Maureen [name], female    DOB: May 27, 1966, 49 y.o.   MRN: [id number]  CC: multiple complaints  HPI: Maureen [name] is a 49 y.o. female presenting on 08/07/2015 for uti symptoms h/o lupus, last flare about 2 months ago when urinary symptoms started  UTI sx ongoing for two months. Has been worse at times but symptoms never go away Urinating over 20 times a day Slowed down some recently when she Decreased her amount of fluid intact H/o interstitial cystitis, followed before by urology, was told there was nothing they could do. Would continue to intermittently have UTI symptoms without bacteria present She can control it some basedon avoiding certain foods Symptoms worse around time of lupus flares Feeling nauseous at times Burning with urination Small amount of vaginal discharge bladder spasms with emptying bladder Hot flashes have been increasing. Wants to have her hormone levels checked No fevers, no CP, no SOB. Some back pain but has a spinal nerve stimulater in place. Feels constipated. Ran out of miralax Muscle relaxants have helped in past.  Relevant past medical, surgical, family and social history reviewed and updated as indicated. Interim medical history since our last visit reviewed. Allergies and medications reviewed and updated.  ROS: Per HPI unless specifically indicated above  Past Medical History Patient Active Problem List   Diagnosis Date Noted  . Fibrositis 01/29/2014  . Atypical chest pain 09/02/2013  . Polypharmacy 05/01/2013  . Arthritis 04/16/2013  . Abdominal pain, epigastric 03/26/2013  . Back pain, chronic 12/05/2012  . Connective tissue disease, undifferentiated ([place]) 12/05/2012  . Cerebrospinal fluid leak from spinal puncture 08/24/2012  . UNSPECIFIED DISORDER OF STOMACH AND DUODENUM 08/26/2008    Current Outpatient Prescriptions  Medication Sig Dispense Refill  . azaTHIOprine (IMURAN) 50 MG tablet Take 150 mg by  mouth.    . fluocinonide (LIDEX) 0.05 % external solution Apply twice daily as needed to scalp    . gabapentin (NEURONTIN) 300 MG capsule Take 1,200 mg by mouth at bedtime.     . hydrochlorothiazide (HYDRODIURIL) 25 MG tablet Take 1 tablet (25 mg total) by mouth daily. 90 tablet 3  . HYDROcodone-acetaminophen (NORCO) 10-325 MG per tablet Take 1 tablet by mouth every 4 (four) hours as needed. Per Dr. Arnoldo Morale 30 tablet 0  . hydroxychloroquine (PLAQUENIL) 200 MG tablet Take 200 mg by mouth 2 (two) times daily.     . hydroxychloroquine (PLAQUENIL) 200 MG tablet TAKE 1 TABLET BY MOUTH TWICE A DAY    . lamoTRIgine (LAMICTAL) 25 MG tablet Take 75 mg by mouth every evening.    . methocarbamol (ROBAXIN) 500 MG tablet Take 1 tablet (500 mg total) by mouth 4 (four) times daily as needed for muscle spasms. 60 tablet 5  . omeprazole (PRILOSEC) 40 MG capsule Take 40 mg by mouth daily.    Marland Kitchen omeprazole (PRILOSEC) 40 MG capsule TAKE 1 CAPSULE DAILY 30 capsule 4  . oxyCODONE-acetaminophen (PERCOCET) 10-325 MG per tablet Take 1 tablet by mouth every evening. Dr. Arnoldo Morale    . polycarbophil (FIBERCON) 625 MG tablet Take 625 mg by mouth daily.    . polyethylene glycol (MIRALAX / GLYCOLAX) packet Take 17 g by mouth daily. 30 each 11  . predniSONE (DELTASONE) 5 MG tablet 4 po QAM x 5, thereafter reduce the QAM dose by half-tab (2.5 mg) until back at chronic 5 mg QAM dose    . Quinacrine HCl POWD Take 100 mg by mouth.    Marland Kitchen  ondansetron (ZOFRAN-ODT) 4 MG disintegrating tablet Take 1 tablet (4 mg total) by mouth every 8 (eight) hours as needed for nausea or vomiting. 20 tablet 1   No current facility-administered medications for this visit.       Objective:    BP 126/81 mmHg  Pulse 69  Temp(Src) 97.3 F (36.3 C) (Oral)  Ht 5\' 4"  (1.626 m)  Wt 148 lb 9.6 oz (67.405 kg)  BMI 25.49 kg/m2  Wt Readings from Last 3 Encounters:  08/07/15 148 lb 9.6 oz (67.405 kg)  03/30/15 146 lb (66.225 kg)  02/18/15 146 lb 12.8 oz  (66.588 kg)     Gen: NAD, alert, cooperative with exam, NCAT EYES: EOMI, no scleral injection or icterus ENT:  TMs pearly gray b/l, OP without erythema LYMPH: no cervical LAD CV: NRRR, normal S1/S2, no murmur, distal pulses 2+ b/l Resp: CTABL, no wheezes, normal WOB Abd: +BS, soft, NTND. no guarding or organomegaly.  Ext: No edema, warm Neuro: Alert and oriented MSK: Some mild CVA tenderness though also with generalized back pain with palpation.     Assessment & Plan:    Maureen [name] was seen today for uti symptoms, UA with 1-3 WBCs. Given symptoms will send for culture. For neck/back spasms continue robaxin, she does well with this medicine, and does not feel as sleepy as she does with flexeril. She has intermittent nasuea associated with lupus and other medical problems that she occasionally takes zofran at home for. This control symptoms, she has not recently ahd any vomiting. Will refill. She is having increasing hot flashes, discussed how that alone is enough to diagnose menopause at her age. SHe has had a hysterectomy years ago. Even if [facility] is not elevated it does not mean she is not going through menopause. SHe wants the level checked so she knows what it is.  Diagnoses and all orders for this visit:  UTI symptoms -     POCT urinalysis dipstick -     POCT UA - Microscopic Only -     Urine culture  Cervicalgia -     methocarbamol (ROBAXIN) 500 MG tablet; Take 1 tablet (500 mg total) by mouth 4 (four) times daily as needed for muscle spasms.  Constipation, unspecified constipation type -     polyethylene glycol (MIRALAX / GLYCOLAX) packet; Take 17 g by mouth daily.  Nausea and vomiting, intractability of vomiting not specified, unspecified vomiting type -     ondansetron (ZOFRAN-ODT) 4 MG disintegrating tablet; Take 1 tablet (4 mg total) by mouth every 8 (eight) hours as needed for nausea or vomiting.  Menopause -     Follicle Stimulating Hormone  Follow up plan: Return if  symptoms worsen or fail to improve.  Assunta Found, MD [facility] 08/07/2015, 6:34 PM

## 2015-08-09 LAB — FOLLICLE STIMULATING HORMONE: FSH: 94.9 m[IU]/mL

## 2015-08-11 LAB — URINE CULTURE

## 2015-08-20 ENCOUNTER — Encounter: Payer: Self-pay | Admitting: *Deleted

## 2015-08-20 ENCOUNTER — Ambulatory Visit (INDEPENDENT_AMBULATORY_CARE_PROVIDER_SITE_OTHER): Payer: 59 | Admitting: Family Medicine

## 2015-08-20 ENCOUNTER — Encounter: Payer: Self-pay | Admitting: Family Medicine

## 2015-08-20 VITALS — BP 105/75 | HR 93 | Temp 97.6°F | Ht 64.0 in | Wt 146.0 lb

## 2015-08-20 DIAGNOSIS — M7061 Trochanteric bursitis, right hip: Secondary | ICD-10-CM | POA: Diagnosis not present

## 2015-08-20 NOTE — Progress Notes (Signed)
   HPI  Patient presents today with right hip pain.  Patient explains that she has bilateral hip pain right greater than left, it's been going on for about 3 months. States that it's been steadily worsening and that she has contacted her pain management clinic for a steroid injection of her right bursa as she believes she has right sided greater trochanteric bursitis and they can't work her in for about 2 weeks. She's requesting injection right greater stroke bursa  She states that she's had right lateral hip pain worse with movements and walking, she also has right sided groin pain worse with internal rotation of the knee. She denies fever, chills, sweats. She has lupus and has recently tapered down her prednisone to 5 mg daily.  She is on percocet per pain man. Which helps but doesn't resolve teh pain.   Sh ehas increased pain with walking and sitting in certain positions.   PMH: Smoking status noted ROS: Per HPI  Objective: BP 105/75 mmHg  Pulse 93  Temp(Src) 97.6 F (36.4 C) (Oral)  Ht 5\' 4"  (1.626 m)  Wt 146 lb (66.225 kg)  BMI 25.05 kg/m2 Gen: NAD, alert, cooperative with exam HEENT: NCAT CV: RRR, good S1/S2, no murmur Resp: CTABL, no wheezes, non-labored Ext: No edema, warm Neuro: Alert and oriented, No gross deficits MSK: R hip with medial/groin pain with internal rotation of th eknee Tenderness to palpation over the right greater trochanter She also has tenderness in the left side over the left greater trochanter   Right greater trochanter bursa injection The area was prepped with iodine 2 and white clear with alcohol. Using a 25-gauge/1.5 inch needle 3 ml 1% marcaine and 1 mL 80 mg/mL depo medrol were injected into the grater trochanteric bursa. It  was covered with a sterile bandage She tolerated the procedure well   Assessment and plan:  # Greater trochanteric bursitis Injected with 80 mg of Depo-Medrol tonight Discussed supportive care and cautioned  against frequent injection, she has an appointment for the same procedure at her pain management clinic in 2 weeks (possibly do L side then) Reasons to return reviewed and provided    Laroy Apple, MD [facility] 08/20/2015, 7:11 PM

## 2015-08-20 NOTE — Patient Instructions (Signed)
Great to meet you!  Hip Bursitis Bursitis is a swelling and soreness (inflammation) of a fluid-filled sac (bursa). This sac overlies and protects the joints.  CAUSES   Injury.  Overuse of the muscles surrounding the joint.  Arthritis.  Gout.  Infection.  Cold weather.  Inadequate warm-up and conditioning prior to activities. The cause may not be known.  SYMPTOMS   Mild to severe irritation.  Tenderness and swelling over the outside of the hip.  Pain with motion of the hip.  If the bursa becomes infected, a fever may be present. Redness, tenderness, and warmth will develop over the hip. Symptoms usually lessen in 3 to 4 weeks with treatment, but can come back. TREATMENT If conservative treatment does not work, your caregiver may advise draining the bursa and injecting cortisone into the area. This may speed up the healing process. This may also be used as an initial treatment of choice. HOME CARE INSTRUCTIONS   Apply ice to the affected area for 15-20 minutes every 3 to 4 hours while awake for the first 2 days. Put the ice in a plastic bag and place a towel between the bag of ice and your skin.  Rest the painful joint as much as possible, but continue to put the joint through a normal range of motion at least 4 times per day. When the pain lessens, begin normal, slow movements and usual activities to help prevent stiffness of the hip.  Only take over-the-counter or prescription medicines for pain, discomfort, or fever as directed by your caregiver.  Use crutches to limit weight bearing on the hip joint, if advised.  Elevate your painful hip to reduce swelling. Use pillows for propping and cushioning your legs and hips.  Gentle massage may provide comfort and decrease swelling. SEEK IMMEDIATE MEDICAL CARE IF:   Your pain increases even during treatment, or you are not improving.  You have a fever.  You have heat and inflammation over the involved bursa.  You have  any other questions or concerns. MAKE SURE YOU:   Understand these instructions.  Will watch your condition.  Will get help right away if you are not doing well or get worse.   This information is not intended to replace advice given to you by your health care provider. Make sure you discuss any questions you have with your health care provider.   Document Released: 04/01/2002 Document Revised: 01/02/2012 Document Reviewed: 05/12/2015 Elsevier Interactive Patient Education [employer].

## 2015-08-21 ENCOUNTER — Telehealth: Payer: Self-pay | Admitting: Family

## 2015-09-30 ENCOUNTER — Other Ambulatory Visit: Payer: 59

## 2015-10-02 ENCOUNTER — Other Ambulatory Visit: Payer: Self-pay | Admitting: Sports Medicine

## 2015-10-02 DIAGNOSIS — M25551 Pain in right hip: Secondary | ICD-10-CM

## 2015-10-02 LAB — CBC WITH DIFFERENTIAL/PLATELET
BASOS ABS: 0 10*3/uL (ref 0.0–0.2)
Basos: 0 %
EOS (ABSOLUTE): 0 10*3/uL (ref 0.0–0.4)
Eos: 1 %
Hematocrit: 35.8 % (ref 34.0–46.6)
Hemoglobin: 12.5 g/dL (ref 11.1–15.9)
IMMATURE GRANS (ABS): 0 10*3/uL (ref 0.0–0.1)
IMMATURE GRANULOCYTES: 0 %
LYMPHS: 25 %
Lymphocytes Absolute: 0.9 10*3/uL (ref 0.7–3.1)
MCH: 35.1 pg — AB (ref 26.6–33.0)
MCHC: 34.9 g/dL (ref 31.5–35.7)
MCV: 101 fL — ABNORMAL HIGH (ref 79–97)
Monocytes Absolute: 0.3 10*3/uL (ref 0.1–0.9)
Monocytes: 7 %
NEUTROS ABS: 2.5 10*3/uL (ref 1.4–7.0)
NEUTROS PCT: 67 %
PLATELETS: 230 10*3/uL (ref 150–379)
RBC: 3.56 x10E6/uL — ABNORMAL LOW (ref 3.77–5.28)
RDW: 13.9 % (ref 12.3–15.4)
WBC: 3.7 10*3/uL (ref 3.4–10.8)

## 2015-10-02 LAB — HEPATIC FUNCTION PANEL
ALT: 24 IU/L (ref 0–32)
AST: 13 IU/L (ref 0–40)
Albumin: 4.3 g/dL (ref 3.5–5.5)
Alkaline Phosphatase: 42 IU/L (ref 39–117)
Bilirubin Total: 0.2 mg/dL (ref 0.0–1.2)
Bilirubin, Direct: 0.09 mg/dL (ref 0.00–0.40)
Total Protein: 6.1 g/dL (ref 6.0–8.5)

## 2015-10-07 ENCOUNTER — Other Ambulatory Visit: Payer: Self-pay | Admitting: Sports Medicine

## 2015-10-07 DIAGNOSIS — M5441 Lumbago with sciatica, right side: Secondary | ICD-10-CM

## 2015-10-16 ENCOUNTER — Other Ambulatory Visit: Payer: Medicaid Other

## 2015-10-16 ENCOUNTER — Ambulatory Visit
Admission: RE | Admit: 2015-10-16 | Discharge: 2015-10-16 | Disposition: A | Discharge status: Home / Self Care | Payer: 59 | Source: Ambulatory Visit | Attending: Sports Medicine | Admitting: Sports Medicine

## 2015-10-16 DIAGNOSIS — M5441 Lumbago with sciatica, right side: Secondary | ICD-10-CM

## 2015-10-16 DIAGNOSIS — M25551 Pain in right hip: Secondary | ICD-10-CM

## 2015-10-20 ENCOUNTER — Encounter: Payer: Self-pay | Admitting: Obstetrics and Gynecology

## 2015-10-20 ENCOUNTER — Encounter: Payer: 59 | Admitting: Obstetrics and Gynecology

## 2015-10-20 ENCOUNTER — Ambulatory Visit (INDEPENDENT_AMBULATORY_CARE_PROVIDER_SITE_OTHER): Payer: 59 | Admitting: Obstetrics and Gynecology

## 2015-10-20 VITALS — BP 110/62 | HR 80 | Resp 14 | Ht 63.25 in | Wt 142.0 lb

## 2015-10-20 DIAGNOSIS — R1031 Right lower quadrant pain: Secondary | ICD-10-CM | POA: Diagnosis not present

## 2015-10-20 DIAGNOSIS — N952 Postmenopausal atrophic vaginitis: Secondary | ICD-10-CM | POA: Diagnosis not present

## 2015-10-20 DIAGNOSIS — G8929 Other chronic pain: Secondary | ICD-10-CM | POA: Diagnosis not present

## 2015-10-20 DIAGNOSIS — Z01419 Encounter for gynecological examination (general) (routine) without abnormal findings: Secondary | ICD-10-CM

## 2015-10-20 DIAGNOSIS — N951 Menopausal and female climacteric states: Secondary | ICD-10-CM

## 2015-10-20 DIAGNOSIS — Z0000 Encounter for general adult medical examination without abnormal findings: Secondary | ICD-10-CM | POA: Diagnosis not present

## 2015-10-20 LAB — POCT URINALYSIS DIPSTICK
GLUCOSE UA: NEGATIVE
Ketones, UA: NEGATIVE
Leukocytes, UA: NEGATIVE
NITRITE UA: NEGATIVE
PROTEIN UA: NEGATIVE
UROBILINOGEN UA: NEGATIVE
pH, UA: 5

## 2015-10-20 NOTE — Patient Instructions (Signed)
Try Replens or another vaginal moisturizer. If your symptoms persist call the office and we will call in some vaginal estrogen for you.  Take miralax daily, if abdominal pain persists after treating the constipation, call for an ultrasound appointment  EXERCISE AND DIET:  We recommended that you start or continue a regular exercise program for good health. Regular exercise means any activity that makes your heart beat faster and makes you sweat.  We recommend exercising at least 30 minutes per day at least 3 days a week, preferably 4 or 5.  We also recommend a diet low in fat and sugar.  Inactivity, poor dietary choices and obesity can cause diabetes, heart attack, stroke, and kidney damage, among others.    ALCOHOL AND SMOKING:  Women should limit their alcohol intake to no more than 7 drinks/beers/glasses of wine (combined, not each!) per week. Moderation of alcohol intake to this level decreases your risk of breast cancer and liver damage. And of course, no recreational drugs are part of a healthy lifestyle.  And absolutely no smoking or even second hand smoke. Most people know smoking can cause heart and lung diseases, but did you know it also contributes to weakening of your bones? Aging of your skin?  Yellowing of your teeth and nails?  CALCIUM AND VITAMIN D:  Adequate intake of calcium and Vitamin D are recommended.  The recommendations for exact amounts of these supplements seem to change often, but generally speaking 600 mg of calcium (either carbonate or citrate) and 800 units of Vitamin D per day seems prudent. Certain women may benefit from higher intake of Vitamin D.  If you are among these women, your doctor will have told you during your visit.    PAP SMEARS:  Pap smears, to check for cervical cancer or precancers,  have traditionally been done yearly, although recent scientific advances have shown that most women can have pap smears less often.  However, every woman still should have a  physical exam from her gynecologist every year. It will include a breast check, inspection of the vulva and vagina to check for abnormal growths or skin changes, a visual exam of the cervix, and then an exam to evaluate the size and shape of the uterus and ovaries.  And after 48 years of age, a rectal exam is indicated to check for rectal cancers. We will also provide age appropriate advice regarding health maintenance, like when you should have certain vaccines, screening for sexually transmitted diseases, bone density testing, colonoscopy, mammograms, etc.   MAMMOGRAMS:  All women over 57 years old should have a yearly mammogram. Many facilities now offer a "3D" mammogram, which may cost around $50 extra out of pocket. If possible,  we recommend you accept the option to have the 3D mammogram performed.  It both reduces the number of women who will be called back for extra views which then turn out to be normal, and it is better than the routine mammogram at detecting truly abnormal areas.    COLONOSCOPY:  Colonoscopy to screen for colon cancer is recommended for all women at age 76.  We know, you hate the idea of the prep.  We agree, BUT, having colon cancer and not knowing it is worse!!  Colon cancer so often starts as a polyp that can be seen and removed at colonscopy, which can quite literally save your life!  And if your first colonoscopy is normal and you have no family history of colon cancer, most women  don't have to have it again for 10 years.  Once every ten years, you can do something that may end up saving your life, right?  We will be happy to help you get it scheduled when you are ready.  Be sure to check your insurance coverage so you understand how much it will cost.  It may be covered as a preventative service at no cost, but you should check your particular policy.

## 2015-10-20 NOTE — Progress Notes (Signed)
Patient ID: Maureen [name], female   DOB: 1965-10-31, 49 y.o.   MRN: [id number] 49 y.o. [id number] Legally SeparatedCaucasianF here for annual exam.   She c/o frequent RLQ abdominal pain for the last few years. She gets episodes of abdominal cramping, sharp pain, can last for an hour at a time. She can go weeks without the pain. She does have issues with constipation, BM q 1-2 days, partial BM. Only taking miralax intermittently, she is on narcotics for back pain (s/p surgery). Boaz was elevated in 10/16. She has a h/o a hysterectomy. She has had vasomotor symptoms intermittently, tolerable at the moment. Night sweats are worse with her lupus flare.  She c/o some hair growth on her face, she has been on steroids.  Not sexually active, no vaginal bleeding. She c/o vaginal dryness. She has a h/o interstitial cystitis. Void frequently and has bladder pains. More vaginal discomfort in the last year, dry.      No LMP recorded. Patient has had a hysterectomy.          Sexually active: No.  The current method of family planning is status post hysterectomy.    Exercising: No.  The patient does not participate in regular exercise at present. Smoker:  Former smoker  Health Maintenance: Pap:  2012 WNL per patient History of abnormal Pap:  no MMG:  09-22-14 WNL  Colonoscopy:  08-19-14 WNL -repeat in 10 years  BMD:   Never TDaP:  Unsure Gardasil: N/A   reports that she quit smoking about 7 years ago. Her smoking use included Cigarettes. She has a 10 pack-year smoking history. She has never used smokeless tobacco. She reports that she does not drink alcohol or use illicit drugs. Rare ETOH use. 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 3 kids, 38, twins are 31. She lives alone.   Past Medical History  Diagnosis Date  . Abdominal pain   . Cancer ([place])     MOLE PRE  . Arthritis   . Cerebrospinal fluid leak from spinal puncture 08/24/2012  . Raynaud's disease   . Lupus ([place])     takes Plaquenil daily  .  Anxiety     takes Xanax daily as needed  . Constipation     takes Dulcolax and Miralax daily as needed;also Fiber Pills  . GERD (gastroesophageal reflux disease)     takes Omeprazole daily  . Muscle spasm     takes Robaxin daily as needed  . Depression     takes Lamictal daily  . Heart murmur     slight  . History of bronchitis     last time many yrs ago(42yrs ago)  . Pneumonia     hx of > 26yrs ago  . Headache(784.0)   . Numbness and tingling in hands   . Fibromyalgia   . Osteoarthritis   . Joint pain   . Joint swelling   . Chronic back pain   . Internal hemorrhoid   . Urinary frequency   . Urinary urgency   . Interstitial cystitis     no problems since beginning of Jan 2014    Past Surgical History  Procedure Laterality Date  . Spinal fusion      x 2   . Abdominal hernia repair    . Cholecystectomy    . Uterine ablation    . Abdominal hysterectomy  8/12  . Tonsillectomy    . Cesarean section    . Shoulder surgery Right     x  2  . Esophagogastroduodenoscopy N/A 05/08/2013    Procedure: ESOPHAGOGASTRODUODENOSCOPY (EGD);  Surgeon: Rogene Houston, MD;  [facility];  Service: Endoscopy;  Laterality: N/A;  315  . Colonoscopy    . Appendectomy    . Ovarian cyst removal    . Diagnostic laparoscopy      adhesions  . Spinal cord stimulator insertion N/A 06/13/2014    Procedure: LUMBAR SPINAL CORD STIMULATOR INSERTION;  Surgeon: Bonna Gains, MD;  Location: [facility];  Service: Neurosurgery;  Laterality: N/A;  . Endometrial ablation    . Tubal ligation      Current Outpatient Prescriptions  Medication Sig Dispense Refill  . azaTHIOprine (IMURAN) 50 MG tablet Take 150 mg by mouth.    . gabapentin (NEURONTIN) 300 MG capsule Take 1,200 mg by mouth at bedtime.     . hydrochlorothiazide (HYDRODIURIL) 25 MG tablet Take 1 tablet (25 mg total) by mouth daily. 90 tablet 3  . HYDROcodone-acetaminophen (NORCO) 10-325 MG per tablet Take 1 tablet by mouth every 4  (four) hours as needed. Per Dr. Arnoldo Morale 30 tablet 0  . hydroxychloroquine (PLAQUENIL) 200 MG tablet Take 200 mg by mouth 2 (two) times daily.     Marland Kitchen lamoTRIgine (LAMICTAL) 25 MG tablet Take 75 mg by mouth every evening.    . methocarbamol (ROBAXIN) 500 MG tablet Take 1 tablet (500 mg total) by mouth 4 (four) times daily as needed for muscle spasms. 60 tablet 5  . omeprazole (PRILOSEC) 40 MG capsule Take 40 mg by mouth daily.    . ondansetron (ZOFRAN-ODT) 4 MG disintegrating tablet Take 1 tablet (4 mg total) by mouth every 8 (eight) hours as needed for nausea or vomiting. 20 tablet 1  . oxyCODONE-acetaminophen (PERCOCET) 10-325 MG per tablet Take 1 tablet by mouth every evening. Dr. Arnoldo Morale    . polycarbophil (FIBERCON) 625 MG tablet Take 625 mg by mouth daily.    . polyethylene glycol (MIRALAX / GLYCOLAX) packet Take 17 g by mouth daily. 30 each 11  . predniSONE (DELTASONE) 5 MG tablet 4 po QAM x 5, thereafter reduce the QAM dose by half-tab (2.5 mg) until back at chronic 5 mg QAM dose    . Quinacrine HCl POWD Take 100 mg by mouth.     No current facility-administered medications for this visit.    Family History  Problem Relation Age of Onset  . Hyperlipidemia Mother   . Depression Mother   . Anxiety disorder Mother   . Ovarian cancer Maternal Aunt   . Colon cancer Maternal Uncle   . Colon cancer Paternal Aunt   . Breast cancer Maternal Grandmother   . Alzheimer's disease Maternal Grandmother     Review of Systems  Constitutional: Negative.   HENT: Negative.   Eyes: Negative.   Respiratory: Negative.   Cardiovascular: Negative.   Gastrointestinal: Positive for abdominal pain.       Right lower abdominal pain   Endocrine: Negative.   Genitourinary: Positive for frequency.  Musculoskeletal: Negative.   Skin: Negative.   Allergic/Immunologic: Negative.   Neurological: Negative.   Psychiatric/Behavioral: Negative.     Exam:   BP 110/62 mmHg  Pulse 80  Resp 14  Ht 5' 3.25"  (1.607 m)  Wt 142 lb (64.411 kg)  BMI 24.94 kg/m2  Weight change: @WEIGHTCHANGE @ Height:   Height: 5' 3.25" (160.7 cm)  Ht Readings from Last 3 Encounters:  10/20/15 5' 3.25" (1.607 m)  08/20/15 5\' 4"  (1.626 m)  08/07/15 5'  4" (1.626 m)    General appearance: alert, cooperative and appears stated age Head: Normocephalic, without obvious abnormality, atraumatic Neck: no adenopathy, supple, symmetrical, trachea midline and thyroid normal to inspection and palpation Lungs: clear to auscultation bilaterally Breasts: normal appearance, no masses or tenderness Heart: regular rate and rhythm Abdomen: soft, non-tender; bowel sounds normal; no masses,  no organomegaly Extremities: extremities normal, atraumatic, no cyanosis or edema Skin: Skin color, texture, turgor normal. No rashes or lesions Lymph nodes: Cervical, supraclavicular, and axillary nodes normal. No abnormal inguinal nodes palpated Neurologic: Grossly normal   Pelvic: External genitalia:  no lesions              Urethra:  normal appearing urethra with no masses, tenderness or lesions              Bartholins and Skenes: normal                 Vagina: normal appearing vagina with mild atrophy              Cervix: absent   Bladder: not tender to palpation               Bimanual Exam:  Uterus:  uterus absent              Adnexa: no mass, fullness, tenderness               Rectovaginal: Confirms               Anus:  normal sphincter tone, no lesions  Chaperone was present for exam.  A:  Well Woman with normal exam  Vasomotor symptoms, tolerable   Mild vaginal atrophy  RLQ abdominal pain, she has constipation and points to the area of her ascending colon as to the area where she gets pain. Normal pelvic exam   Constipation, on narcotics secondary to chronic pain  P:   No pap   Mammogram   Try vaginal Replens, if not helping she will call for some vaginal estrogen  She will take her miralax daily, if her RLQ pain doesn't  resolve with resolution of constipation she will return for a pelvic ultrasound   Discussed breast self exam  Discussed calcium and vit D

## 2015-10-27 ENCOUNTER — Telehealth: Payer: Self-pay | Admitting: Family

## 2016-02-02 ENCOUNTER — Encounter: Payer: Self-pay | Admitting: Physical Medicine and Rehabilitation

## 2016-02-05 ENCOUNTER — Ambulatory Visit (HOSPITAL_COMMUNITY): Payer: 59 | Admitting: Psychiatry

## 2016-02-08 ENCOUNTER — Other Ambulatory Visit: Payer: Self-pay | Admitting: Sports Medicine

## 2016-02-08 DIAGNOSIS — M25551 Pain in right hip: Principal | ICD-10-CM

## 2016-02-08 DIAGNOSIS — G8929 Other chronic pain: Secondary | ICD-10-CM

## 2016-02-17 ENCOUNTER — Ambulatory Visit
Admission: RE | Admit: 2016-02-17 | Discharge: 2016-02-17 | Disposition: A | Discharge status: Home / Self Care | Payer: 59 | Source: Ambulatory Visit | Attending: Sports Medicine | Admitting: Sports Medicine

## 2016-02-17 DIAGNOSIS — G8929 Other chronic pain: Secondary | ICD-10-CM

## 2016-02-17 DIAGNOSIS — M25551 Pain in right hip: Principal | ICD-10-CM

## 2016-02-17 MED ORDER — METHYLPREDNISOLONE ACETATE 40 MG/ML INJ SUSP (RADIOLOG
120.0000 mg | Freq: Once | INTRAMUSCULAR | Status: AC
  Administered 2016-02-17: 120 mg via INTRA_ARTICULAR

## 2016-02-17 MED ORDER — IOPAMIDOL (ISOVUE-M 200) INJECTION 41%
1.0000 mL | Freq: Once | INTRAMUSCULAR | Status: AC
  Administered 2016-02-17: 1 mL via INTRA_ARTICULAR

## 2016-02-17 NOTE — Discharge Instructions (Signed)

## 2016-02-20 ENCOUNTER — Encounter (HOSPITAL_COMMUNITY): Payer: Self-pay | Admitting: Emergency Medicine

## 2016-02-20 ENCOUNTER — Emergency Department (HOSPITAL_COMMUNITY)
Admission: EM | Admit: 2016-02-20 | Discharge: 2016-02-20 | Disposition: A | Discharge status: Home / Self Care | Payer: 59 | Attending: Emergency Medicine | Admitting: Emergency Medicine

## 2016-02-20 ENCOUNTER — Emergency Department (HOSPITAL_COMMUNITY): Payer: 59

## 2016-02-20 DIAGNOSIS — K219 Gastro-esophageal reflux disease without esophagitis: Secondary | ICD-10-CM | POA: Insufficient documentation

## 2016-02-20 DIAGNOSIS — Z79899 Other long term (current) drug therapy: Secondary | ICD-10-CM | POA: Diagnosis not present

## 2016-02-20 DIAGNOSIS — Z8701 Personal history of pneumonia (recurrent): Secondary | ICD-10-CM | POA: Insufficient documentation

## 2016-02-20 DIAGNOSIS — F329 Major depressive disorder, single episode, unspecified: Secondary | ICD-10-CM | POA: Diagnosis not present

## 2016-02-20 DIAGNOSIS — M545 Low back pain: Secondary | ICD-10-CM

## 2016-02-20 DIAGNOSIS — Y998 Other external cause status: Secondary | ICD-10-CM | POA: Insufficient documentation

## 2016-02-20 DIAGNOSIS — Z87448 Personal history of other diseases of urinary system: Secondary | ICD-10-CM | POA: Insufficient documentation

## 2016-02-20 DIAGNOSIS — G8929 Other chronic pain: Secondary | ICD-10-CM | POA: Insufficient documentation

## 2016-02-20 DIAGNOSIS — Z87891 Personal history of nicotine dependence: Secondary | ICD-10-CM | POA: Insufficient documentation

## 2016-02-20 DIAGNOSIS — R011 Cardiac murmur, unspecified: Secondary | ICD-10-CM | POA: Insufficient documentation

## 2016-02-20 DIAGNOSIS — K5900 Constipation, unspecified: Secondary | ICD-10-CM | POA: Insufficient documentation

## 2016-02-20 DIAGNOSIS — R202 Paresthesia of skin: Secondary | ICD-10-CM

## 2016-02-20 DIAGNOSIS — Y9389 Activity, other specified: Secondary | ICD-10-CM | POA: Diagnosis not present

## 2016-02-20 DIAGNOSIS — M1990 Unspecified osteoarthritis, unspecified site: Secondary | ICD-10-CM | POA: Insufficient documentation

## 2016-02-20 DIAGNOSIS — M62830 Muscle spasm of back: Secondary | ICD-10-CM | POA: Diagnosis not present

## 2016-02-20 DIAGNOSIS — Z880 Allergy status to penicillin: Secondary | ICD-10-CM | POA: Diagnosis not present

## 2016-02-20 DIAGNOSIS — S3992XA Unspecified injury of lower back, initial encounter: Secondary | ICD-10-CM | POA: Diagnosis present

## 2016-02-20 DIAGNOSIS — F419 Anxiety disorder, unspecified: Secondary | ICD-10-CM | POA: Diagnosis not present

## 2016-02-20 DIAGNOSIS — Z7952 Long term (current) use of systemic steroids: Secondary | ICD-10-CM | POA: Insufficient documentation

## 2016-02-20 DIAGNOSIS — Z859 Personal history of malignant neoplasm, unspecified: Secondary | ICD-10-CM | POA: Diagnosis not present

## 2016-02-20 DIAGNOSIS — Y92410 Unspecified street and highway as the place of occurrence of the external cause: Secondary | ICD-10-CM | POA: Insufficient documentation

## 2016-02-20 MED ORDER — MORPHINE SULFATE (PF) 4 MG/ML IV SOLN
4.0000 mg | Freq: Once | INTRAVENOUS | Status: AC
  Administered 2016-02-20: 4 mg via INTRAMUSCULAR
  Filled 2016-02-20: qty 1

## 2016-02-20 MED ORDER — KETOROLAC TROMETHAMINE 30 MG/ML IJ SOLN
60.0000 mg | Freq: Once | INTRAMUSCULAR | Status: AC
  Administered 2016-02-20: 60 mg via INTRAMUSCULAR
  Filled 2016-02-20: qty 2
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
## 2016-02-20 NOTE — ED Notes (Signed)
Pt c/o numbness in toes but sts "This has been ongoing since I fell 8 months ago." Pt has been struggling with hip pain ever since the fall. Pt also sts that she just found out she is no longer fused the way she is supposed to be in her back.

## 2016-02-22 ENCOUNTER — Other Ambulatory Visit: Payer: Self-pay

## 2016-02-22 ENCOUNTER — Encounter: Payer: Self-pay | Admitting: Family

## 2016-02-22 ENCOUNTER — Ambulatory Visit (INDEPENDENT_AMBULATORY_CARE_PROVIDER_SITE_OTHER): Payer: 59 | Admitting: Family

## 2016-02-22 VITALS — BP 122/87 | HR 97 | Temp 97.4°F | Ht 63.25 in | Wt 139.4 lb

## 2016-02-22 DIAGNOSIS — G8929 Other chronic pain: Secondary | ICD-10-CM | POA: Diagnosis not present

## 2016-02-22 DIAGNOSIS — M549 Dorsalgia, unspecified: Secondary | ICD-10-CM

## 2016-02-22 DIAGNOSIS — M25511 Pain in right shoulder: Secondary | ICD-10-CM | POA: Diagnosis not present

## 2016-02-22 DIAGNOSIS — M359 Systemic involvement of connective tissue, unspecified: Secondary | ICD-10-CM

## 2016-02-22 DIAGNOSIS — R399 Unspecified symptoms and signs involving the genitourinary system: Secondary | ICD-10-CM

## 2016-02-22 LAB — URINALYSIS, COMPLETE
BILIRUBIN UA: NEGATIVE
Glucose, UA: NEGATIVE
Leukocytes, UA: NEGATIVE
Nitrite, UA: NEGATIVE
PH UA: 7 (ref 5.0–7.5)
Protein, UA: NEGATIVE
Specific Gravity, UA: 1.01 (ref 1.005–1.030)
UUROB: 0.2 mg/dL (ref 0.2–1.0)

## 2016-02-22 LAB — MICROSCOPIC EXAMINATION: RENAL EPITHEL UA: NONE SEEN /HPF

## 2016-02-22 MED ORDER — HYDROCODONE-ACETAMINOPHEN 7.5-325 MG PO TABS: 1.0000 | ORAL_TABLET | Freq: Four times a day (QID) | ORAL | Status: DC | PRN

## 2016-02-22 NOTE — Patient Instructions (Signed)

## 2016-02-22 NOTE — Addendum Note (Signed)
Addended by: Earlene Plater on: 02/22/2016 05:57 PM   Modules accepted: Orders
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
## 2016-02-23 LAB — CBC WITH DIFFERENTIAL/PLATELET
BASOS: 0 %
Basophils Absolute: 0 10*3/uL (ref 0.0–0.2)
EOS (ABSOLUTE): 0 10*3/uL (ref 0.0–0.4)
EOS: 1 %
HEMATOCRIT: 37 % (ref 34.0–46.6)
HEMOGLOBIN: 12.9 g/dL (ref 11.1–15.9)
IMMATURE GRANS (ABS): 0 10*3/uL (ref 0.0–0.1)
IMMATURE GRANULOCYTES: 0 %
LYMPHS: 32 %
Lymphocytes Absolute: 1 10*3/uL (ref 0.7–3.1)
MCH: 34.9 pg — ABNORMAL HIGH (ref 26.6–33.0)
MCHC: 34.9 g/dL (ref 31.5–35.7)
MCV: 100 fL — AB (ref 79–97)
MONOCYTES: 8 %
Monocytes Absolute: 0.2 10*3/uL (ref 0.1–0.9)
NEUTROS PCT: 59 %
Neutrophils Absolute: 1.8 10*3/uL (ref 1.4–7.0)
PLATELETS: 230 10*3/uL (ref 150–379)
RBC: 3.7 x10E6/uL — ABNORMAL LOW (ref 3.77–5.28)
RDW: 15.2 % (ref 12.3–15.4)
WBC: 3 10*3/uL — ABNORMAL LOW (ref 3.4–10.8)

## 2016-02-26 ENCOUNTER — Encounter (HOSPITAL_COMMUNITY): Payer: Self-pay | Admitting: Psychiatry

## 2016-02-26 ENCOUNTER — Ambulatory Visit (INDEPENDENT_AMBULATORY_CARE_PROVIDER_SITE_OTHER): Payer: 59 | Admitting: Psychiatry

## 2016-02-26 VITALS — BP 102/64 | HR 89 | Ht 64.0 in | Wt 138.0 lb

## 2016-02-26 DIAGNOSIS — F3131 Bipolar disorder, current episode depressed, mild: Secondary | ICD-10-CM | POA: Diagnosis not present

## 2016-02-26 DIAGNOSIS — F1421 Cocaine dependence, in remission: Secondary | ICD-10-CM | POA: Diagnosis not present

## 2016-02-26 DIAGNOSIS — F1010 Alcohol abuse, uncomplicated: Secondary | ICD-10-CM | POA: Diagnosis not present

## 2016-02-26 MED ORDER — TRAZODONE HCL 50 MG PO TABS: 25.0000 mg | ORAL_TABLET | Freq: Every day | ORAL | Status: DC
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
## 2016-03-14 ENCOUNTER — Other Ambulatory Visit (HOSPITAL_COMMUNITY): Payer: Self-pay | Admitting: Psychiatry

## 2016-03-14 ENCOUNTER — Telehealth (HOSPITAL_COMMUNITY): Payer: Self-pay

## 2016-03-14 DIAGNOSIS — F3131 Bipolar disorder, current episode depressed, mild: Secondary | ICD-10-CM

## 2016-03-14 NOTE — Telephone Encounter (Signed)
Okay to refill? 

## 2016-03-14 NOTE — Telephone Encounter (Signed)
Patient is coming in for appointment this Thursday, she took her last Lamictal this morning and would like to know if she can get it refilled today. Please review and advise, thank you

## 2016-03-15 MED ORDER — LAMOTRIGINE 25 MG PO TABS: 75.0000 mg | ORAL_TABLET | Freq: Every evening | ORAL | Status: DC

## 2016-03-15 NOTE — Telephone Encounter (Signed)
Sent to the pharmacy

## 2016-03-17 ENCOUNTER — Ambulatory Visit (INDEPENDENT_AMBULATORY_CARE_PROVIDER_SITE_OTHER): Payer: 59 | Admitting: Psychiatry

## 2016-03-17 ENCOUNTER — Encounter (HOSPITAL_COMMUNITY): Payer: Self-pay | Admitting: Psychiatry

## 2016-03-17 VITALS — BP 110/80 | HR 90 | Ht 64.0 in | Wt 139.4 lb

## 2016-03-17 DIAGNOSIS — F3131 Bipolar disorder, current episode depressed, mild: Secondary | ICD-10-CM

## 2016-03-17 DIAGNOSIS — F1421 Cocaine dependence, in remission: Secondary | ICD-10-CM | POA: Diagnosis not present

## 2016-03-17 MED ORDER — LAMOTRIGINE 25 MG PO TABS: 75.0000 mg | ORAL_TABLET | Freq: Every evening | ORAL | Status: DC

## 2016-03-17 NOTE — Progress Notes (Signed)
[facility] progress Note  Maureen [name] [id number] 50 y.o.  03/17/2016 2:39 PM  Chief Complaint:  I am taking Lamictal and trazodone.  I still have depression.  My lupus is flaring up.  I am in a lot of pain.    History of Present Illness:  Maureen [name] is a 50 year old Caucasian, separated, employed female who was seen first time on May 5 for initial evaluation.  She came for her follow-up appointment.  She was referred from her previous psychiatrist at [facility].  She moved to [facility] in November 2016 from [place] and unable to see psychiatrist at day [facility].  She is taking Lamictal 75 mg daily and trazodone 50 mg at bedtime.  She is not taking trazodone recently because she is taking muscle relaxant and she does not want to mix trazodone with a muscle relaxant.  She is frustrated because she has a flareup and her lupus has been worsening.  She has a lot of pain and she cannot stay outside in the sun.  She is going to multiple doctors and frustrated with her financial problems.  She lives by herself.  Her job is going okay.  Despite lupus she is working full-time and she feels proud of it.  Last week she celebrated her 49 year anniversary of sobriety.  We are still awaiting records from day [place] .  She continues to have episodes of depression, crying spells, frustration and hopelessness.  She admitted some time manic episode when she does not sleep all night.  However she does not want to change or increase her dose of Lamictal.  She had tried increasing the dose in the past but it worsens her lupus.  Her appetite is okay.  Her vitals are stable.  Suicidal Ideation: No Plan Formed: No Patient has means to carry out plan: No  Homicidal Ideation: No Plan Formed: No Patient has means to carry out plan: No  Past Psychiatric History/Hospitalization(s): Patient has long history of drug addiction and diagnosed with depression and bipolar disorder in her early 50s.  She has one psychiatric  hospitalization at age 50 when she took overdose on pills and require hospitalization.  She had another brief ER visit few years later when she threatened to kill herself.  She admitted history of severe mood swing, mania, impulsive behavior and depression.  She had tried lithium, Prozac, Zyprexa, Abilify and increase dose of Lamictal with limited response.  Patient developed mania symptoms with antidepressant.  She has seen psychiatrists at [place] and then day [place] at [facility] mental [facility]. Anxiety: Yes Bipolar Disorder: Yes Depression: Yes Mania: Yes Psychosis: No Schizophrenia: No Personality Disorder: No Hospitalization for psychiatric illness: Yes History of Electroconvulsive Shock Therapy: No Prior Suicide Attempts: Yes  Family History; Patient reported great aunt had bipolar disorder.  Medical History; Patient has multiple health issues which includes fibromyalgia, arthritis, GERD, lupus, Raynaud disease, chronic back pain, interstitial cystitis and urinary frequency.  She see physician at [facility].  She has a history of spinal fusion, back surgery, cholecystectomy, U Tran ablation, abdominal hysterectomy, tonsillectomy, shoulder surgery, appendectomy and recently spinal cord stimulator insertion.  She see Dr. Dorisann Frames at [facility] for her lupus.  Psychosocial History; Patient born in [place] and raised in [place].  Her parents divorced when she was only 64 years old.  She was raised by her mother.  Patient never had lost contact with her father.  She has a sister who lives  in [place].  Patient is separated from her husband because husband had addiction issues but she still has close contact and good support from him.  She has 51 year old twin daughters and 49 year old daughter.  She has 18-year-old grandchild.  Patient is very attached with her children.  She lives by herself.  Substance Abuse History; Patient  has a strong history of drug addiction including crack cocaine and DUI.  She claims to be sober from crack cocaine for more than 10 years.  She still drinks beer once a month but denies any intoxication, binge or any blackouts.  She had treatment in the past At [facility].  Review of Systems  Constitutional: Negative.   Respiratory: Negative.   Cardiovascular: Negative for chest pain.  Musculoskeletal: Positive for joint pain.  Skin: Negative for itching and rash.  Neurological: Positive for headaches. Negative for dizziness.  Psychiatric/Behavioral: Positive for depression. The patient is nervous/anxious.     Psychiatric: Agitation: No Hallucination: No Depressed Mood: Yes Insomnia: Yes Hypersomnia: No Altered Concentration: No Feels Worthless: No Grandiose Ideas: No Belief In Special Powers: No New/Increased Substance Abuse: No Compulsions: No  Neurologic: Headache: Yes Seizure: No Paresthesias: No   Outpatient Encounter Prescriptions as of 03/17/2016  Medication Sig  . ALPRAZolam (XANAX) 0.5 MG tablet Take 0.5 mg by mouth at bedtime as needed for anxiety or sleep.   Marland Kitchen azaTHIOprine (IMURAN) 50 MG tablet Take 150 mg by mouth daily.   . carisoprodol (SOMA) 350 MG tablet Take 350 mg by mouth 2 (two) times daily as needed for muscle spasms.   Marland Kitchen gabapentin (NEURONTIN) 300 MG capsule Take 1,200 mg by mouth at bedtime.   . hydrochlorothiazide (HYDRODIURIL) 25 MG tablet Take 1 tablet (25 mg total) by mouth daily.  Marland Kitchen HYDROcodone-acetaminophen (NORCO) 7.5-325 MG tablet Take 1 tablet by mouth every 6 (six) hours as needed for moderate pain.  . hydroxychloroquine (PLAQUENIL) 200 MG tablet Take 200 mg by mouth 2 (two) times daily.   Marland Kitchen lamoTRIgine (LAMICTAL) 25 MG tablet Take 3 tablets (75 mg total) by mouth every evening.  . Meth-Hyo-M Bl-Na Phos-Ph Sal (URIBEL) 118 MG CAPS Take 118 mg by mouth 3 (three) times daily as needed (bladder).   Marland Kitchen omeprazole (PRILOSEC) 40 MG capsule Take 40 mg by  mouth daily as needed (acid reflux). Reported on 02/26/2016  . ondansetron (ZOFRAN-ODT) 4 MG disintegrating tablet Take 1 tablet (4 mg total) by mouth every 8 (eight) hours as needed for nausea or vomiting.  . polyethylene glycol (MIRALAX / GLYCOLAX) packet Take 17 g by mouth daily.  . predniSONE (DELTASONE) 1 MG tablet Take 3 mg by mouth daily.  . traZODone (DESYREL) 50 MG tablet Take 0.5 tablets (25 mg total) by mouth at bedtime.  . [DISCONTINUED] lamoTRIgine (LAMICTAL) 25 MG tablet Take 3 tablets (75 mg total) by mouth every evening.   No facility-administered encounter medications on file as of 03/17/2016.    Recent Results (from the past 2160 hour(s))  Urinalysis, Complete     Status: Abnormal   Collection Time: 02/22/16 11:28 AM  Result Value Ref Range   Specific Gravity, UA 1.010 1.005 - 1.030   pH, UA 7.0 5.0 - 7.5   Color, UA Yellow Yellow   Appearance Ur Clear Clear   Leukocytes, UA Negative Negative   Protein, UA Negative Negative/Trace   Glucose, UA Negative Negative   Ketones, UA Trace (A) Negative   RBC, UA Trace (A) Negative   Bilirubin, UA Negative Negative   Urobilinogen, Ur  0.2 0.2 - 1.0 mg/dL   Nitrite, UA Negative Negative   Microscopic Examination See below:   Microscopic Examination     Status: None   Collection Time: 02/22/16 11:28 AM  Result Value Ref Range   WBC, UA 0-5 0 -  5 /hpf   RBC, UA 0-2 0 -  2 /hpf   Epithelial Cells (non renal) 0-10 0 - 10 /hpf   Renal Epithel, UA None seen None seen /hpf   Bacteria, UA Few None seen/Few  CBC with Differential/Platelet     Status: Abnormal   Collection Time: 02/22/16  5:57 PM  Result Value Ref Range   WBC 3.0 (L) 3.4 - 10.8 x10E3/uL   RBC 3.70 (L) 3.77 - 5.28 x10E6/uL   Hemoglobin 12.9 11.1 - 15.9 g/dL   Hematocrit 37.0 34.0 - 46.6 %   MCV 100 (H) 79 - 97 fL   MCH 34.9 (H) 26.6 - 33.0 pg   MCHC 34.9 31.5 - 35.7 g/dL   RDW 15.2 12.3 - 15.4 %   Platelets 230 150 - 379 x10E3/uL   Neutrophils 59 %   Lymphs  32 %   Monocytes 8 %   Eos 1 %   Basos 0 %   Neutrophils Absolute 1.8 1.4 - 7.0 x10E3/uL   Lymphocytes Absolute 1.0 0.7 - 3.1 x10E3/uL   Monocytes Absolute 0.2 0.1 - 0.9 x10E3/uL   EOS (ABSOLUTE) 0.0 0.0 - 0.4 x10E3/uL   Basophils Absolute 0.0 0.0 - 0.2 x10E3/uL   Immature Granulocytes 0 %   Immature Grans (Abs) 0.0 0.0 - 0.1 x10E3/uL      Constitutional:  BP 110/80 mmHg  Pulse 90  Ht 5\' 4"  (1.626 m)  Wt 139 lb 6.4 oz (63.231 kg)  BMI 23.92 kg/m2   Musculoskeletal: Strength & Muscle Tone: within normal limits Gait & Station: normal Patient leans: N/A  Psychiatric Specialty Exam: General Appearance: Fairly Groomed  Engineer, water::  Fair  Speech:  Slow  Volume:  Normal  Mood:  Anxious  Affect:  Congruent  Thought Process:  Coherent  Orientation:  Full (Time, Place, and Person)  Thought Content:  Rumination  Suicidal Thoughts:  No  Homicidal Thoughts:  No  Memory:  Immediate;   Fair Recent;   Good Remote;   Good  Judgement:  Good  Insight:  Fair  Psychomotor Activity:  Normal  Concentration:  Fair  Recall:  [employer] of Knowledge:  Fair  Language:  Fair  Akathisia:  No  Handed:  Right  AIMS (if indicated):     Assets:  Communication Skills Desire for Improvement Financial Resources/Insurance Housing  ADL's:  Intact  Cognition:  WNL  Sleep:        Established Problem, Stable/Improving (1), Review of Psycho-Social Stressors (1), Review and summation of old records (2), Review of Last Therapy Session (1) and Review of Medication Regimen & Side Effects (2)  Assessment: Axis I: Bipolar disorder type I most recently depressed type.  Cocaine dependence in complete remission.  Alcohol abuse  Axis II: Deferred  Axis III:  Past Medical History  Diagnosis Date  . Abdominal pain   . Cancer ([place])     MOLE PRE  . Arthritis   . Cerebrospinal fluid leak from spinal puncture 08/24/2012  . Raynaud's disease   . Lupus ([place])     takes Plaquenil daily  . Anxiety      takes Xanax daily as needed  . Constipation     takes Dulcolax and  Miralax daily as needed;also Fiber Pills  . GERD (gastroesophageal reflux disease)     takes Omeprazole daily  . Muscle spasm     takes Robaxin daily as needed  . Depression     takes Lamictal daily  . Heart murmur     slight  . History of bronchitis     last time many yrs ago(1yrs ago)  . Pneumonia     hx of > 28yrs ago  . Headache(784.0)   . Numbness and tingling in hands   . Fibromyalgia   . Osteoarthritis   . Joint pain   . Joint swelling   . Chronic back pain   . Internal hemorrhoid   . Urinary frequency   . Urinary urgency   . Interstitial cystitis     no problems since beginning of Jan 2014     Plan:  I discuss length about her symptoms.  Brief psychotherapy done about her psychosocial stressors and coping skills.  I offered counseling one more time but patient declined due to financial reasons.  She wants to continue trazodone 50 mg at bedtime and Lamictal 25 mg daily.  She has no rash or itching from the Lamictal.  She has not taken Xanax in a while which she wants to use for severe panic attack.  We are still waiting records from her previous psychiatrist.  I recommended to call us back if she is any question concerns she feels worsening of the symptom.  Follow-up in 2 months. Time spent 25 minutes.  More than 50% of the time spent in psychoeducation, counseling and coordination of care.  Discuss safety plan that anytime having active suicidal thoughts or homicidal thoughts then patient need to call 911 or go to the local emergency room.  Dalissa Lovin T., MD 03/17/2016

## 2016-04-04 ENCOUNTER — Other Ambulatory Visit: Payer: Self-pay | Admitting: Orthopedic Surgery

## 2016-04-04 DIAGNOSIS — M545 Low back pain: Principal | ICD-10-CM

## 2016-04-04 DIAGNOSIS — M542 Cervicalgia: Secondary | ICD-10-CM

## 2016-04-04 DIAGNOSIS — G8929 Other chronic pain: Secondary | ICD-10-CM

## 2016-04-14 ENCOUNTER — Other Ambulatory Visit: Payer: Self-pay | Admitting: Family

## 2016-04-15 ENCOUNTER — Ambulatory Visit
Admission: RE | Admit: 2016-04-15 | Discharge: 2016-04-15 | Disposition: A | Discharge status: Home / Self Care | Payer: 59 | Source: Ambulatory Visit | Attending: Orthopedic Surgery | Admitting: Orthopedic Surgery

## 2016-04-15 ENCOUNTER — Other Ambulatory Visit: Payer: Self-pay | Admitting: Orthopedic Surgery

## 2016-04-15 VITALS — BP 103/68 | HR 72

## 2016-04-15 DIAGNOSIS — M542 Cervicalgia: Secondary | ICD-10-CM

## 2016-04-15 DIAGNOSIS — M545 Low back pain, unspecified: Secondary | ICD-10-CM

## 2016-04-15 DIAGNOSIS — G8929 Other chronic pain: Secondary | ICD-10-CM

## 2016-04-15 DIAGNOSIS — M549 Dorsalgia, unspecified: Principal | ICD-10-CM

## 2016-04-15 MED ORDER — MEPERIDINE HCL 100 MG/ML IJ SOLN
75.0000 mg | Freq: Once | INTRAMUSCULAR | Status: AC
  Administered 2016-04-15: 75 mg via INTRAMUSCULAR

## 2016-04-15 MED ORDER — IOPAMIDOL (ISOVUE-M 300) INJECTION 61%
10.0000 mL | Freq: Once | INTRAMUSCULAR | Status: AC | PRN
  Administered 2016-04-15: 10 mL via INTRATHECAL

## 2016-04-15 MED ORDER — HYDROXYZINE HCL 50 MG/ML IM SOLN
25.0000 mg | Freq: Once | INTRAMUSCULAR | Status: AC
  Administered 2016-04-15: 25 mg via INTRAMUSCULAR

## 2016-04-15 MED ORDER — DIAZEPAM 5 MG PO TABS
5.0000 mg | ORAL_TABLET | Freq: Once | ORAL | Status: AC
  Administered 2016-04-15: 5 mg via ORAL

## 2016-04-15 NOTE — Discharge Instructions (Signed)

## 2016-04-24 ENCOUNTER — Emergency Department (HOSPITAL_COMMUNITY)
Admission: EM | Admit: 2016-04-24 | Discharge: 2016-04-24 | Disposition: A | Discharge status: Home / Self Care | Payer: 59 | Attending: Emergency Medicine | Admitting: Emergency Medicine

## 2016-04-24 ENCOUNTER — Emergency Department (HOSPITAL_COMMUNITY): Payer: 59

## 2016-04-24 ENCOUNTER — Encounter (HOSPITAL_COMMUNITY): Payer: Self-pay

## 2016-04-24 DIAGNOSIS — M1990 Unspecified osteoarthritis, unspecified site: Secondary | ICD-10-CM | POA: Insufficient documentation

## 2016-04-24 DIAGNOSIS — Z79899 Other long term (current) drug therapy: Secondary | ICD-10-CM | POA: Diagnosis not present

## 2016-04-24 DIAGNOSIS — Z85828 Personal history of other malignant neoplasm of skin: Secondary | ICD-10-CM | POA: Diagnosis not present

## 2016-04-24 DIAGNOSIS — Z87891 Personal history of nicotine dependence: Secondary | ICD-10-CM | POA: Diagnosis not present

## 2016-04-24 DIAGNOSIS — R220 Localized swelling, mass and lump, head: Secondary | ICD-10-CM | POA: Diagnosis present

## 2016-04-24 DIAGNOSIS — K1120 Sialoadenitis, unspecified: Secondary | ICD-10-CM | POA: Insufficient documentation

## 2016-04-24 DIAGNOSIS — K1121 Acute sialoadenitis: Secondary | ICD-10-CM

## 2016-04-24 DIAGNOSIS — F329 Major depressive disorder, single episode, unspecified: Secondary | ICD-10-CM | POA: Diagnosis not present

## 2016-04-24 LAB — CBC WITH DIFFERENTIAL/PLATELET
BASOS ABS: 0 10*3/uL (ref 0.0–0.1)
BASOS PCT: 0 %
Eosinophils Absolute: 0 10*3/uL (ref 0.0–0.7)
Eosinophils Relative: 0 %
HEMATOCRIT: 34.5 % — AB (ref 36.0–46.0)
HEMOGLOBIN: 12.5 g/dL (ref 12.0–15.0)
Lymphocytes Relative: 32 %
Lymphs Abs: 1.3 10*3/uL (ref 0.7–4.0)
MCH: 34.5 pg — ABNORMAL HIGH (ref 26.0–34.0)
MCHC: 36.2 g/dL — ABNORMAL HIGH (ref 30.0–36.0)
MCV: 95.3 fL (ref 78.0–100.0)
Monocytes Absolute: 0.3 10*3/uL (ref 0.1–1.0)
Monocytes Relative: 6 %
NEUTROS ABS: 2.4 10*3/uL (ref 1.7–7.7)
NEUTROS PCT: 62 %
Platelets: 210 10*3/uL (ref 150–400)
RBC: 3.62 MIL/uL — ABNORMAL LOW (ref 3.87–5.11)
RDW: 12.4 % (ref 11.5–15.5)
WBC: 3.9 10*3/uL — ABNORMAL LOW (ref 4.0–10.5)

## 2016-04-24 LAB — I-STAT CHEM 8, ED
BUN: 8 mg/dL (ref 6–20)
Calcium, Ion: 1.22 mmol/L (ref 1.13–1.30)
Chloride: 96 mmol/L — ABNORMAL LOW (ref 101–111)
Creatinine, Ser: 0.8 mg/dL (ref 0.44–1.00)
Glucose, Bld: 102 mg/dL — ABNORMAL HIGH (ref 65–99)
HEMATOCRIT: 36 % (ref 36.0–46.0)
HEMOGLOBIN: 12.2 g/dL (ref 12.0–15.0)
Potassium: 3.8 mmol/L (ref 3.5–5.1)
Sodium: 136 mmol/L (ref 135–145)
TCO2: 31 mmol/L (ref 0–100)

## 2016-04-24 MED ORDER — IOPAMIDOL (ISOVUE-300) INJECTION 61%
80.0000 mL | Freq: Once | INTRAVENOUS | Status: AC | PRN
Start: 2016-04-24 — End: 2016-04-24
  Administered 2016-04-24: 80 mL via INTRAVENOUS

## 2016-04-24 MED ORDER — SODIUM CHLORIDE 0.9 % IV BOLUS (SEPSIS)
1000.0000 mL | Freq: Once | INTRAVENOUS | Status: AC
Start: 2016-04-24 — End: 2016-04-24
  Administered 2016-04-24: 1000 mL via INTRAVENOUS

## 2016-04-24 MED ORDER — DEXAMETHASONE SODIUM PHOSPHATE 10 MG/ML IJ SOLN
10.0000 mg | Freq: Once | INTRAMUSCULAR | Status: AC
  Administered 2016-04-24: 10 mg via INTRAVENOUS
  Filled 2016-04-24: qty 1

## 2016-04-24 MED ORDER — DIPHENHYDRAMINE HCL 50 MG/ML IJ SOLN
25.0000 mg | Freq: Once | INTRAMUSCULAR | Status: AC
  Administered 2016-04-24: 25 mg via INTRAVENOUS
  Filled 2016-04-24: qty 1

## 2016-04-24 MED ORDER — FAMOTIDINE IN NACL 20-0.9 MG/50ML-% IV SOLN
20.0000 mg | Freq: Once | INTRAVENOUS | Status: AC
  Administered 2016-04-24: 20 mg via INTRAVENOUS
  Filled 2016-04-24: qty 50

## 2016-04-24 MED ORDER — CEPHALEXIN 500 MG PO CAPS: 500.0000 mg | ORAL_CAPSULE | Freq: Four times a day (QID) | ORAL | Status: DC

## 2016-04-24 MED ORDER — CEPHALEXIN 500 MG PO CAPS
500.0000 mg | ORAL_CAPSULE | Freq: Once | ORAL | Status: AC
  Administered 2016-04-24: 500 mg via ORAL
  Filled 2016-04-24: qty 1

## 2016-04-24 NOTE — ED Provider Notes (Signed)
CSN: [id number]     Arrival date & time 04/24/16  1853 History   First MD Initiated Contact with Patient 04/24/16 1857     Chief Complaint  Patient presents with  . Allergic Reaction     (Consider location/radiation/quality/duration/timing/severity/associated sxs/prior Treatment) HPI   Blood pressure 141/88, pulse 86, temperature 98.1 F (36.7 C), temperature source Oral, resp. rate 18, SpO2 100 %.  Maureen [name] is a 50 y.o. female complaining of Acute onset of left lower cheek swelling while she was eating dinner just prior to arrival. Patient was eating fried chicken, patient denies rash, pruritus, history of allergic reaction, nausea, vomiting, shortness of breath, lip or tongue swelling.  Past Medical History  Diagnosis Date  . Abdominal pain   . Cancer ([place])     MOLE PRE  . Arthritis   . Cerebrospinal fluid leak from spinal puncture 08/24/2012  . Raynaud's disease   . Lupus ([place])     takes Plaquenil daily  . Anxiety     takes Xanax daily as needed  . Constipation     takes Dulcolax and Miralax daily as needed;also Fiber Pills  . GERD (gastroesophageal reflux disease)     takes Omeprazole daily  . Muscle spasm     takes Robaxin daily as needed  . Depression     takes Lamictal daily  . Heart murmur     slight  . History of bronchitis     last time many yrs ago(52yrs ago)  . Pneumonia     hx of > 93yrs ago  . Headache(784.0)   . Numbness and tingling in hands   . Fibromyalgia   . Osteoarthritis   . Joint pain   . Joint swelling   . Chronic back pain   . Internal hemorrhoid   . Urinary frequency   . Urinary urgency   . Interstitial cystitis     no problems since beginning of Jan 2014   Past Surgical History  Procedure Laterality Date  . Spinal fusion      x 2   . Abdominal hernia repair    . Cholecystectomy    . Uterine ablation    . Vaginal hysterectomy  8/12  . Tonsillectomy    . Cesarean section    . Shoulder surgery Right     x 2  .  Esophagogastroduodenoscopy N/A 05/08/2013    Procedure: ESOPHAGOGASTRODUODENOSCOPY (EGD);  Surgeon: Rogene Houston, MD;  Location: [facility];  Service: Endoscopy;  Laterality: N/A;  315  . Colonoscopy    . Appendectomy    . Ovarian cyst removal    . Diagnostic laparoscopy      adhesions  . Spinal cord stimulator insertion N/A 06/13/2014    Procedure: LUMBAR SPINAL CORD STIMULATOR INSERTION;  Surgeon: Bonna Gains, MD;  Location: [place] [facility]URO ORS;  Service: Neurosurgery;  Laterality: N/A;  . Endometrial ablation    . Tubal ligation     Family History  Problem Relation Age of Onset  . Hyperlipidemia Mother   . Depression Mother   . Anxiety disorder Mother   . Ovarian cancer Maternal Aunt   . Colon cancer Maternal Uncle   . Colon cancer Paternal Aunt   . Breast cancer Maternal Grandmother   . Alzheimer's disease Maternal Grandmother    Social History  Substance Use Topics  . Smoking status: Former Smoker -- 0.50 packs/day for 20 years    Types: Cigarettes    Quit date: 06/09/2008  . Smokeless  tobacco: Never Used  . Alcohol Use: No   OB History    Gravida Para Term Preterm AB TAB SAB Ectopic Multiple Living   2 2 1 1     1 3      Review of Systems  10 systems reviewed and found to be negative, except as noted in the HPI.  Allergies  Penicillins and Tegretol  Home Medications   Prior to Admission medications   Medication Sig Start Date End Date Taking? Authorizing Provider  ALPRAZolam Duanne Moron) 0.5 MG tablet Take 0.5 mg by mouth at bedtime as needed for anxiety or sleep.    Yes Historical Provider, MD  azaTHIOprine (IMURAN) 50 MG tablet Take 125 mg by mouth daily.  02/26/15  Yes Historical Provider, MD  carisoprodol (SOMA) 350 MG tablet Take 350 mg by mouth 2 (two) times daily as needed for muscle spasms.  12/22/15  Yes Historical Provider, MD  gabapentin (NEURONTIN) 300 MG capsule Take 1,200 mg by mouth at bedtime.    Yes Historical Provider, MD  hydrochlorothiazide  (HYDRODIURIL) 25 MG tablet TAKE 1 TABLET (25 MG TOTAL) BY MOUTH DAILY. 04/14/16  Yes Sharion Balloon, FNP  HYDROcodone-acetaminophen (NORCO) 7.5-325 MG tablet Take 1 tablet by mouth every 6 (six) hours as needed for moderate pain. 02/22/16  Yes Sharion Balloon, FNP  hydroxychloroquine (PLAQUENIL) 200 MG tablet Take 200 mg by mouth 2 (two) times daily.    Yes Historical Provider, MD  lamoTRIgine (LAMICTAL) 25 MG tablet Take 3 tablets (75 mg total) by mouth every evening. 03/17/16  Yes [name], MD  Meth-Hyo-M Barnett Hatter Phos-Ph Sal (URIBEL) 118 MG CAPS Take 118 mg by mouth 3 (three) times daily as needed (bladder).  11/23/15  Yes Historical Provider, MD  ondansetron (ZOFRAN-ODT) 4 MG disintegrating tablet Take 1 tablet (4 mg total) by mouth every 8 (eight) hours as needed for nausea or vomiting. 08/07/15  Yes Eustaquio Maize, MD  oxyCODONE-acetaminophen (PERCOCET/ROXICET) 5-325 MG tablet Take 1 tablet by mouth at bedtime as needed. Pain. 04/14/16  Yes Historical Provider, MD  polyethylene glycol (MIRALAX / GLYCOLAX) packet Take 17 g by mouth daily. 08/07/15  Yes Eustaquio Maize, MD  predniSONE (DELTASONE) 1 MG tablet Take 3 mg by mouth daily. 02/04/16  Yes Historical Provider, MD  cephALEXin (KEFLEX) 500 MG capsule Take 1 capsule (500 mg total) by mouth 4 (four) times daily. 04/24/16   Crimson Dubberly, PA-C  traZODone (DESYREL) 50 MG tablet Take 0.5 tablets (25 mg total) by mouth at bedtime. Patient not taking: Reported on 04/24/2016 02/26/16   [name], MD   BP 135/81 mmHg  Pulse 73  Temp(Src) 98.1 F (36.7 C) (Oral)  Resp 18  SpO2 99% Physical Exam  Constitutional: She is oriented to person, place, and time. She appears well-developed and well-nourished. No distress.  HENT:  Head: Normocephalic.  Mouth/Throat: No oropharyngeal exudate.  Left parotid and submandibular swelling with no overlying skin changes or warmth, no intraoral swelling, to bilateral tympanic membranes with normal architecture  and good light reflex. Patient has no rash, nausea vomiting, she speaking in complete sentences, lung sounds are clear to auscultation bilaterally  Eyes: Conjunctivae and EOM are normal.  Cardiovascular: Normal rate.   Pulmonary/Chest: Effort normal and breath sounds normal. No stridor. No respiratory distress. She has no wheezes. She has no rales. She exhibits no tenderness.  Abdominal: Soft. Bowel sounds are normal. She exhibits no distension and no mass. There is no tenderness. There is no rebound  and no guarding.  Musculoskeletal: Normal range of motion.  Neurological: She is alert and oriented to person, place, and time.  Psychiatric: She has a normal mood and affect.  Nursing note and vitals reviewed.   ED Course  Procedures (including critical care time) Labs Review Labs Reviewed  CBC WITH DIFFERENTIAL/PLATELET - Abnormal; Notable for the following:    WBC 3.9 (*)    RBC 3.62 (*)    HCT 34.5 (*)    MCH 34.5 (*)    MCHC 36.2 (*)    All other components within normal limits  I-STAT CHEM 8, ED - Abnormal; Notable for the following:    Chloride 96 (*)    Glucose, Bld 102 (*)    All other components within normal limits  MISC [facility] TEST (SEND OUT)    Imaging Review Ct Soft Tissue Neck W Contrast  04/24/2016  CLINICAL DATA:  Asymmetric LEFT facial swelling which began earlier today. History of lupus connective tissue disease. EXAM: CT NECK WITH CONTRAST TECHNIQUE: Multidetector CT imaging of the neck was performed using the standard protocol following the bolus administration of intravenous contrast. CONTRAST:  60mL ISOVUE-300 IOPAMIDOL (ISOVUE-300) INJECTION 61% COMPARISON:  Partially visualized submandibular gland on CT myelogram from 04/15/2016. FINDINGS: Pharynx and larynx: Normal Salivary glands: The LEFT parotid gland is enlarged and mildly hyperenhancing. Fullness of the LEFT side of the face is observed. The LEFT submandibular gland is also larger than the RIGHT, mildly  hyperenhancing. Slight intraglandular ductal prominence without obstructing parotid duct or submandibular duct stone. Thyroid: Minor hypoattenuating foci, non worrisome. Lymph nodes: No pathologic adenopathy. Vascular: Patent Limited intracranial: Negative Visualized orbits: Negative Mastoids and visualized paranasal sinuses: Negative Skeleton: Cervical spondylosis worst at C3-C4 on the RIGHT. Upper chest: No upper cervical lesions. Comparison with prior CT myelogram from 04/15/2016, there appears to be little change. IMPRESSION: Unilateral LEFT parotid gland enlargement and enhancement, resulting in facial swelling, consistent with parotitis, with also minor asymmetric enlargement of the LEFT submandibular gland. Viral, autoimmune, or bacterial causes of salivary gland inflammation could have this appearance. No evidence for sialolithiasis. No regional adenopathy or significant subcutaneous inflammatory process. Electronically Signed   By: Staci Righter M.D.   On: 04/24/2016 20:10   I have personally reviewed and evaluated these images and lab results as part of my medical decision-making.   EKG Interpretation None      MDM   Final diagnoses:  Acute parotitis    Filed Vitals:   04/24/16 1900 04/24/16 2112  BP: 141/88 135/81  Pulse: 86 73  Temp: 98.1 F (36.7 C)   TempSrc: Oral   Resp: 18 18  SpO2: 100% 99%    Medications  sodium chloride 0.9 % bolus 1,000 mL (0 mLs Intravenous Stopped 04/24/16 2113)  dexamethasone (DECADRON) injection 10 mg (10 mg Intravenous Given 04/24/16 2001)  famotidine (PEPCID) IVPB 20 mg premix (0 mg Intravenous Stopped 04/24/16 2035)  diphenhydrAMINE (BENADRYL) injection 25 mg (25 mg Intravenous Given 04/24/16 2003)  iopamidol (ISOVUE-300) 61 % injection 80 mL (80 mLs Intravenous Contrast Given 04/24/16 1940)  cephALEXin (KEFLEX) capsule 500 mg (500 mg Oral Given 04/24/16 2115)    Maureen [name] is 50 y.o. female presenting with 2 onset of left-sided lower jaw  swelling will she was eating, it is likely secondary to sallow lithiasis causing a blockage. Patient will be given a allergy cocktail however think this is not likely the source of her discomfort.  Will obtain CT to further evaluate the swelling.  Patient has diffusely edematous parotid and some submandibular swelling with no stone appreciated. She is fully vaccinated, has no sick contacts she is afebrile however she is immunosuppressed she takes both Plaquenil and Imuran and also takes 3 mg of prednisone daily. Due to increase in lumps in the area, we'll test for this. Patient is advised to stay away from any immunosuppressed are pregnant contacts, she is given work note for a week. We've had an extensive discussion of return precautions and patient verbalizes her understanding, I will treat her for a bacterial parotitis with Keflex.  Discussed case with attending physician who agrees with care plan and disposition.   Evaluation does not show pathology that would require ongoing emergent intervention or inpatient treatment. Pt is hemodynamically stable and mentating appropriately. Discussed findings and plan with patient/guardian, who agrees with care plan. All questions answered. Return precautions discussed and outpatient follow up given.   Discharge Medication List as of 04/24/2016  9:13 PM    START taking these medications   Details  cephALEXin (KEFLEX) 500 MG capsule Take 1 capsule (500 mg total) by mouth 4 (four) times daily., Starting 04/24/2016, Until Discontinued, Print             Monico Blitz, PA-C 04/24/16 2149  Forde Dandy, MD 04/25/16 1536

## 2016-04-24 NOTE — ED Notes (Signed)
Pt reports understanding of discharge information. No questions at time of discharge 

## 2016-04-24 NOTE — Discharge Instructions (Signed)
Please follow with your primary care doctor in the next 2 days for a check-up. They must obtain records for further management.   Do not hesitate to return to the Emergency Department for any new, worsening or concerning symptoms.    Parotitis Parotitis is soreness and inflammation of one or both parotid glands. The parotid glands produce saliva. They are located on each side of the face, below and in front of the earlobes. The saliva produced comes out of tiny openings (ducts) inside the cheeks. In most cases, parotitis goes away over time or with treatment. If your parotitis is caused by certain long-term (chronic) diseases, it may come back again.  CAUSES  Parotitis can be caused by:  Viral infections. Mumps is one viral infection that can cause parotitis.  Bacterial infections.  Blockage of the salivary ducts due to a salivary stone.  Narrowing of the salivary ducts.  Swelling of the salivary ducts.  Dehydration.  Autoimmune conditions, such as sarcoidosis or Sjogren syndrome.  Air from activities such as scuba diving, glass blowing, or playing an instrument (rare).  Human immunodeficiency virus (HIV) or acquired immunodeficiency syndrome (AIDS).  Tuberculosis. SIGNS AND SYMPTOMS   The ears may appear to be pushed up and out from their normal position.  Redness (erythema) of the skin over the parotid glands.  Pain and tenderness over the parotid glands.  Swelling in the parotid gland area.  Yellowish-white fluid (pus) coming from the ducts inside the cheeks.  Dry mouth.  Bad taste in the mouth. DIAGNOSIS  Your health care provider may determine that you have parotitis based on your symptoms and a physical exam. A sample of fluid may also be taken from the parotid gland and tested to find the cause of your infection. X-rays or computed tomography (CT) scans may be taken if your health care provider thinks you might have a salivary stone blocking your salivary  duct. TREATMENT  Treatment varies depending upon the cause of your parotitis. If your parotitis is caused by mumps, no treatment is needed. The condition will go away on its own after 7 to 10 days. In other cases, treatment may include:  Antibiotic medicine if your infection was caused by bacteria.  Pain medicines.  Gland massage.  Eating sour candy to increase your saliva production.  Removal of salivary stones. Your health care provider may flush stones out with fluids or remove them with tweezers.  Surgery to remove the parotid glands. HOME CARE INSTRUCTIONS   If you were prescribed an antibiotic medicine, finish it all even if you start to feel better.  Put warm compresses on the sore area.  Take medicines only as directed by your health care provider.  Drink enough fluids to keep your urine clear or pale yellow. SEEK IMMEDIATE MEDICAL CARE IF:   You have increasing pain or swelling that is not controlled with medicine.  You have a fever. MAKE SURE YOU:  Understand these instructions.  Will watch your condition.  Will get help right away if you are not doing well or get worse.   This information is not intended to replace advice given to you by your health care provider. Make sure you discuss any questions you have with your health care provider.   Document Released: 04/01/2002 Document Revised: 10/31/2014 Document Reviewed: 03/05/2015 Elsevier Interactive Patient Education [employer].

## 2016-04-24 NOTE — ED Notes (Signed)
She states that, while eating fried chicken this evening, she experienced acute edema of the left side of her face and angle-of-jaw area.  She arrives a bit anxious and in no distress.  She states that thus far the swelling "feels like it's on the outside--I don't feel any swelling in my throat or in my tongue".  No stridor, nor any wheezing noted.  Her breathing is normal.  She also mentions that she has "lupus" for which she is currently on 3mg  of Prednisone per day.

## 2016-05-02 ENCOUNTER — Telehealth (HOSPITAL_BASED_OUTPATIENT_CLINIC_OR_DEPARTMENT_OTHER): Payer: Self-pay | Admitting: Emergency Medicine

## 2016-05-02 LAB — MISC LABCORP TEST (SEND OUT): LABCORP TEST NAME: 186150

## 2016-05-11 ENCOUNTER — Telehealth (HOSPITAL_COMMUNITY): Payer: Self-pay

## 2016-05-11 DIAGNOSIS — F3131 Bipolar disorder, current episode depressed, mild: Secondary | ICD-10-CM

## 2016-05-11 MED ORDER — ALPRAZOLAM 0.5 MG PO TABS: 0.5000 mg | ORAL_TABLET | Freq: Every evening | ORAL | Status: DC | PRN

## 2016-05-11 NOTE — Telephone Encounter (Signed)
Patient called to see if Dr. Adele Schilder would refill her Xanax. Patient takes rarely and when she took the rx she had from her last doctor it was expired. Dr. Adele Schilder is out of the office, I discussed with Dr. Lovena Le and he agreed to sent in #30 to be used only as needed. I called the patient and let her know.

## 2016-05-18 ENCOUNTER — Ambulatory Visit (HOSPITAL_COMMUNITY): Payer: Self-pay | Admitting: Psychiatry

## 2016-06-01 ENCOUNTER — Telehealth: Payer: Self-pay | Admitting: Family

## 2016-06-01 NOTE — Telephone Encounter (Signed)
Printed and put in drawer for pickup

## 2016-06-13 ENCOUNTER — Other Ambulatory Visit (HOSPITAL_COMMUNITY): Payer: Self-pay

## 2016-06-13 DIAGNOSIS — F3131 Bipolar disorder, current episode depressed, mild: Secondary | ICD-10-CM

## 2016-06-13 MED ORDER — LAMOTRIGINE 25 MG PO TABS: 75.0000 mg | ORAL_TABLET | Freq: Every evening | ORAL | 1 refills | Status: DC

## 2016-06-13 MED ORDER — ALPRAZOLAM 0.5 MG PO TABS: 0.5000 mg | ORAL_TABLET | Freq: Every evening | ORAL | 0 refills | Status: DC | PRN

## 2016-07-01 DIAGNOSIS — M4722 Other spondylosis with radiculopathy, cervical region: Secondary | ICD-10-CM | POA: Insufficient documentation

## 2016-07-01 DIAGNOSIS — M4310 Spondylolisthesis, site unspecified: Secondary | ICD-10-CM | POA: Insufficient documentation

## 2016-07-05 ENCOUNTER — Ambulatory Visit (HOSPITAL_COMMUNITY): Payer: Self-pay | Admitting: Psychiatry

## 2016-07-12 ENCOUNTER — Other Ambulatory Visit (HOSPITAL_COMMUNITY): Payer: Self-pay | Admitting: Psychiatry

## 2016-07-12 DIAGNOSIS — F3131 Bipolar disorder, current episode depressed, mild: Secondary | ICD-10-CM

## 2016-07-14 ENCOUNTER — Telehealth (HOSPITAL_COMMUNITY): Payer: Self-pay

## 2016-07-14 NOTE — Telephone Encounter (Signed)
Patient called for a refill on her Xanax. Patient was here to see you on 5/25, at that time no prescription was given because patient stated that she only took it once in awhile for severe anxiety and she had enough. In July she called for a refill because the prescription she had from her previous doctor had expired, Dr. Lovena Le approved this request and it was sent to the pharmacy. Patient is calling for a refill now, I asked her if she was taking it everyday and she said that she is now because if she doesn't she feels like she is having a heart attack. She states her anxiety has gotten much worse. She follows up with you on 10/26 - okay to refill? Please review and advise, thank you

## 2016-07-18 NOTE — Telephone Encounter (Signed)
Medication refill request - pt. left a message requesting a refill of her Alprazolam. Scheduled to return on 08/18/16.

## 2016-07-20 MED ORDER — ALPRAZOLAM 0.5 MG PO TABS: 0.5000 mg | ORAL_TABLET | Freq: Every evening | ORAL | 0 refills | Status: DC | PRN

## 2016-07-20 NOTE — Telephone Encounter (Signed)
Refilled alprazolam 0.5 mg at bedtime as needed # 30 no additional refills [facility] [phone]

## 2016-07-25 ENCOUNTER — Other Ambulatory Visit (HOSPITAL_COMMUNITY): Payer: Self-pay

## 2016-08-03 ENCOUNTER — Other Ambulatory Visit: Payer: Self-pay | Admitting: Obstetrics and Gynecology

## 2016-08-03 DIAGNOSIS — Z1231 Encounter for screening mammogram for malignant neoplasm of breast: Secondary | ICD-10-CM

## 2016-08-11 ENCOUNTER — Other Ambulatory Visit: Payer: Self-pay | Admitting: Pediatrics

## 2016-08-11 ENCOUNTER — Other Ambulatory Visit (HOSPITAL_COMMUNITY): Payer: Self-pay | Admitting: Psychiatry

## 2016-08-11 DIAGNOSIS — K5900 Constipation, unspecified: Secondary | ICD-10-CM

## 2016-08-11 DIAGNOSIS — F3131 Bipolar disorder, current episode depressed, mild: Secondary | ICD-10-CM

## 2016-08-18 ENCOUNTER — Encounter (HOSPITAL_COMMUNITY): Payer: Self-pay | Admitting: Psychiatry

## 2016-08-18 ENCOUNTER — Ambulatory Visit (INDEPENDENT_AMBULATORY_CARE_PROVIDER_SITE_OTHER): Payer: 59 | Admitting: Psychiatry

## 2016-08-18 DIAGNOSIS — F3131 Bipolar disorder, current episode depressed, mild: Secondary | ICD-10-CM | POA: Diagnosis not present

## 2016-08-18 DIAGNOSIS — Z79899 Other long term (current) drug therapy: Secondary | ICD-10-CM

## 2016-08-18 DIAGNOSIS — Z79891 Long term (current) use of opiate analgesic: Secondary | ICD-10-CM

## 2016-08-18 MED ORDER — ALPRAZOLAM 0.5 MG PO TABS: 0.5000 mg | ORAL_TABLET | Freq: Every evening | ORAL | 2 refills | Status: DC | PRN

## 2016-08-18 MED ORDER — LAMOTRIGINE 25 MG PO TABS: ORAL_TABLET | ORAL | 2 refills | Status: DC

## 2016-08-18 NOTE — Progress Notes (Signed)
[facility] progress Note  Maureen [name] [id number] 50 y.o.  08/18/2016 1:37 PM  Chief Complaint: Medication management and follow-up.    History of Present Illness:  Denisecame for her follow-up appointment.  She reported her summer was very busy.  Her daughter got into accident and injured her skull and sternum but she was glad that she is feeling better now.  Patient reported that she has cut down her lupus medication because she is feeling good.  She is no longer taking Azathiaprine and Plaqnil.  She is taking prednisone but she is hoping to come off in the future.  She also decided to stop the trazodone because she sleeping better.  She does take Xanax every night and Lamictal 75 mg daily.  She has no rash or itching.  In July she went to the emergency room because of swelling in her parotid gland.  She had blood work shows sodium 136, potassium 3.8, BUN 8, creatinine 0.80.  Overall she described her mood is stable.  She denies any irritability, anger, mania or psychosis.  She has no tremors shakes or any rash.  She feels proud because of sobriety for more than 10 years in her addiction.  Her appetite is okay.  Her vital signs are stable.  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  We have not received records from [facility] [place] yet.  Suicidal Ideation: No Plan Formed: No Patient has means to carry out plan: No  Homicidal Ideation: No Plan Formed: No Patient has means to carry out plan: No  Past Psychiatric History/Hospitalization(s): Patient has long history of drug addiction and diagnosed with depression and bipolar disorder in her early 82s.  She has one psychiatric hospitalization at age 51 when she took overdose on pills and require hospitalization.  She had another brief ER visit few years later when she threatened to kill herself.  She admitted history of severe mood swing, mania, impulsive behavior and depression.  She had tried lithium,  Prozac, Zyprexa, Abilify and increase dose of Lamictal with limited response.  Patient developed mania symptoms with antidepressant.  She has seen psychiatrists at [place] and then [facility] [place] [facility]. Anxiety: Yes Bipolar Disorder: Yes Depression: Yes Mania: Yes Psychosis: No Schizophrenia: No Personality Disorder: No Hospitalization for psychiatric illness: Yes History of Electroconvulsive Shock Therapy: No Prior Suicide Attempts: Yes  Family History; Patient reported great aunt had bipolar disorder.  Medical History; Patient has multiple health issues which includes fibromyalgia, arthritis, GERD, lupus, Raynaud disease, chronic back pain, interstitial cystitis and  repeated urinary frequency.  She see physician at [facility] family [facility].  She has a history of spinal fusion, back surgery, cholecystectomy, U Tran ablation, abdominal hysterectomy, tonsillectomy, shoulder surgery, appendectomy and recently spinal cord stimulator insertion.  She see Dr. Dorisann Frames at [facility] for her lupus.  Psychosocial History; Patient born in [place] and raised in [place].  Her parents divorced when she was only 85 years old.  She was raised by her mother.  Patient never had lost contact with her father.  She has a sister who lives in [place].  Patient is separated from her husband because husband had addiction issues but she still has close contact and good support from him.  She has 68 year old twin daughters and 14 year old daughter.  She has 50-year-old grandchild.  Patient is very attached with her children.  She lives by herself.  Substance Abuse  History; Patient has a strong history of drug addiction including crack cocaine and DUI.  She claims to be sober from crack cocaine for more than 10 years.  She still drinks beer once a month but denies any intoxication, binge or any blackouts.  She [facility] in the past At  [facility].  Review of Systems  Constitutional: Negative.   Respiratory: Negative.   Cardiovascular: Negative for chest pain.  Musculoskeletal: Positive for joint pain.  Skin: Negative for itching and rash.  Neurological: Negative for dizziness.    Psychiatric: Agitation: No Hallucination: No Depressed Mood: No Insomnia: No Hypersomnia: No Altered Concentration: No Feels Worthless: No Grandiose Ideas: No Belief In Special Powers: No New/Increased Substance Abuse: No Compulsions: No  Neurologic: Headache: Yes Seizure: No Paresthesias: No   Outpatient Encounter Prescriptions as of 08/18/2016  Medication Sig  . ALPRAZolam (XANAX) 0.5 MG tablet Take 1 tablet (0.5 mg total) by mouth at bedtime as needed for anxiety.  . carisoprodol (SOMA) 350 MG tablet Take 350 mg by mouth 2 (two) times daily as needed for muscle spasms.   Marland Kitchen gabapentin (NEURONTIN) 300 MG capsule Take 1,200 mg by mouth at bedtime.   . hydrochlorothiazide (HYDRODIURIL) 25 MG tablet TAKE 1 TABLET (25 MG TOTAL) BY MOUTH DAILY.  Marland Kitchen HYDROcodone-acetaminophen (NORCO) 7.5-325 MG tablet Take 1 tablet by mouth every 6 (six) hours as needed for moderate pain.  Marland Kitchen lamoTRIgine (LAMICTAL) 25 MG tablet TAKE 3 TABLETS EVERY EVENING  . Meth-Hyo-M Bl-Na Phos-Ph Sal (URIBEL) 118 MG CAPS Take 118 mg by mouth 3 (three) times daily as needed (bladder).   . ondansetron (ZOFRAN-ODT) 4 MG disintegrating tablet Take 1 tablet (4 mg total) by mouth every 8 (eight) hours as needed for nausea or vomiting.  Marland Kitchen oxyCODONE-acetaminophen (PERCOCET/ROXICET) 5-325 MG tablet Take 1 tablet by mouth at bedtime as needed. Pain.  . polyethylene glycol (MIRALAX / GLYCOLAX) packet TAKE 17 G BY MOUTH DAILY.  Marland Kitchen predniSONE (DELTASONE) 1 MG tablet Take 3 mg by mouth daily.  . [DISCONTINUED] ALPRAZolam (XANAX) 0.5 MG tablet Take 1 tablet (0.5 mg total) by mouth at bedtime as needed for anxiety.  . [DISCONTINUED] azaTHIOprine (IMURAN) 50 MG tablet Take 125 mg by mouth  daily.   . [DISCONTINUED] cephALEXin (KEFLEX) 500 MG capsule Take 1 capsule (500 mg total) by mouth 4 (four) times daily.  . [DISCONTINUED] hydroxychloroquine (PLAQUENIL) 200 MG tablet Take 200 mg by mouth 2 (two) times daily.   . [DISCONTINUED] lamoTRIgine (LAMICTAL) 25 MG tablet TAKE 3 TABLETS EVERY EVENING  . [DISCONTINUED] traZODone (DESYREL) 50 MG tablet Take 0.5 tablets (25 mg total) by mouth at bedtime. (Patient not taking: Reported on 04/24/2016)   No facility-administered encounter medications on file as of 08/18/2016.     No results found for this or any previous visit (from the past 2160 hour(s)).    Constitutional:  BP 124/78   Pulse 78   Ht 5' 3.75" (1.619 m)   Wt 148 lb 9.6 oz (67.4 kg)   BMI 25.71 kg/m    Musculoskeletal: Strength & Muscle Tone: within normal limits Gait & Station: normal Patient leans: N/A  Psychiatric Specialty Exam: General Appearance: Fairly Groomed  Engineer, water::  Fair  Speech:  Slow  Volume:  Normal  Mood:  Euthymic  Affect:  Congruent  Thought Process:  Coherent  Orientation:  Full (Time, Place, and Person)  Thought Content:  Rumination  Suicidal Thoughts:  No  Homicidal Thoughts:  No  Memory:  Immediate;   Fair  Recent;   Good Remote;   Good  Judgement:  Good  Insight:  Fair  Psychomotor Activity:  Normal  Concentration:  Fair  Recall:  [employer] of Knowledge:  Fair  Language:  Fair  Akathisia:  No  Handed:  Right  AIMS (if indicated):     Assets:  Communication Skills Desire for Improvement Financial Resources/Insurance Housing  ADL's:  Intact  Cognition:  WNL  Sleep:        Established Problem, Stable/Improving (1), Review of Psycho-Social Stressors (1), Review or order clinical lab tests (1), Review of Last Therapy Session (1) and Review of Medication Regimen & Side Effects (2)  Assessment: Axis I: Bipolar disorder type I most recently depressed type.  Cocaine dependence in complete remission.  Alcohol  abuse  Axis II: Deferred  Axis III:  Past Medical History:  Diagnosis Date  . Abdominal pain   . Anxiety    takes Xanax daily as needed  . Arthritis   . Cancer ([place])    MOLE PRE  . Cerebrospinal fluid leak from spinal puncture 08/24/2012  . Chronic back pain   . Constipation    takes Dulcolax and Miralax daily as needed;also Fiber Pills  . Depression    takes Lamictal daily  . Fibromyalgia   . GERD (gastroesophageal reflux disease)    takes Omeprazole daily  . Headache(784.0)   . Heart murmur    slight  . History of bronchitis    last time many yrs ago(7yrs ago)  . Internal hemorrhoid   . Interstitial cystitis    no problems since beginning of Jan 2014  . Joint pain   . Joint swelling   . Lupus    takes Plaquenil daily  . Muscle spasm    takes Robaxin daily as needed  . Numbness and tingling in hands   . Osteoarthritis   . Pneumonia    hx of > 36yrs ago  . Raynaud's disease   . Urinary frequency   . Urinary urgency      Plan:  Patient is doing better on current dose of medication.  She is no longer taking trazodone.  She had appointment to see her new past Dr. in few weeks.  I will continue his Lamictal 75 mg daily and Xanax 0.5 mg at bedtime.  Discussed polypharmacy as patient is still taking a lot of pain medication but her goal is to wean herself off in the future.  We are still waiting records from her previous psychiatrist.  I recommended to call us back if she is any question concerns she feels worsening of the symptom.  Follow-up in 3 months. Discuss safety plan that anytime having active suicidal thoughts or homicidal thoughts then patient need to call 911 or go to the local emergency room.  Tanaka Gillen T., MD 08/18/2016        Patient ID: [name], female   DOB: 20-Aug-1966, 50 y.o.   MRN: [id number]

## 2016-08-29 ENCOUNTER — Ambulatory Visit
Admission: RE | Admit: 2016-08-29 | Discharge: 2016-08-29 | Disposition: A | Discharge status: Home / Self Care | Payer: 59 | Source: Ambulatory Visit | Attending: Obstetrics and Gynecology | Admitting: Obstetrics and Gynecology

## 2016-08-29 DIAGNOSIS — Z1231 Encounter for screening mammogram for malignant neoplasm of breast: Secondary | ICD-10-CM

## 2016-10-06 ENCOUNTER — Encounter: Payer: Self-pay | Admitting: Family

## 2016-10-06 ENCOUNTER — Ambulatory Visit (INDEPENDENT_AMBULATORY_CARE_PROVIDER_SITE_OTHER): Payer: 59 | Admitting: Family

## 2016-10-06 VITALS — BP 110/77 | HR 82 | Temp 97.1°F | Ht 63.75 in | Wt 148.2 lb

## 2016-10-06 DIAGNOSIS — R300 Dysuria: Secondary | ICD-10-CM | POA: Diagnosis not present

## 2016-10-06 DIAGNOSIS — N3011 Interstitial cystitis (chronic) with hematuria: Secondary | ICD-10-CM

## 2016-10-06 LAB — MICROSCOPIC EXAMINATION
BACTERIA UA: NONE SEEN
RENAL EPITHEL UA: NONE SEEN /HPF

## 2016-10-06 LAB — URINALYSIS, COMPLETE
Bilirubin, UA: NEGATIVE
GLUCOSE, UA: NEGATIVE
Ketones, UA: NEGATIVE
Leukocytes, UA: NEGATIVE
NITRITE UA: NEGATIVE
PH UA: 5.5 (ref 5.0–7.5)
Protein, UA: NEGATIVE
Specific Gravity, UA: <1.005 — ABNORMAL LOW (ref 1.005–1.030)
UUROB: 0.2 mg/dL (ref 0.2–1.0)

## 2016-10-06 MED ORDER — URIBEL 118 MG PO CAPS
118.0000 mg | ORAL_CAPSULE | Freq: Three times a day (TID) | ORAL | 5 refills | Status: DC | PRN
Start: 2016-10-06 — End: 2017-06-30

## 2016-10-06 NOTE — Progress Notes (Signed)
   Subjective:    Patient ID: Maureen [name], female    DOB: 1966/09/20, 50 y.o.   MRN: [id number]  Dysuria   The current episode started 1 to 4 weeks ago. The problem occurs every urination. The problem has been waxing and waning. The quality of the pain is described as burning. The pain is at a severity of 5/10. The pain is mild. Associated symptoms include flank pain, frequency, hesitancy, nausea and urgency. Pertinent negatives include no discharge, hematuria or vomiting. Associated symptoms comments: Odor . She has tried increased fluids for the symptoms. The treatment provided mild relief.      Review of Systems  Gastrointestinal: Positive for nausea. Negative for vomiting.  Genitourinary: Positive for dysuria, flank pain, frequency, hesitancy and urgency. Negative for hematuria.  All other systems reviewed and are negative.      Objective:   Physical Exam  Constitutional: She is oriented to person, place, and time. She appears well-developed and well-nourished. No distress.  HENT:  Head: Normocephalic.  Eyes: Pupils are equal, round, and reactive to light.  Neck: Normal range of motion. Neck supple. No thyromegaly present.  Cardiovascular: Normal rate, regular rhythm, normal heart sounds and intact distal pulses.   No murmur heard. Pulmonary/Chest: Effort normal and breath sounds normal. No respiratory distress. She has no wheezes.  Abdominal: Soft. Bowel sounds are normal. She exhibits no distension. There is tenderness (lower abd).  Musculoskeletal: Normal range of motion. She exhibits no edema or tenderness.  Neurological: She is alert and oriented to person, place, and time.  Skin: Skin is warm and dry.  Psychiatric: She has a normal mood and affect. Her behavior is normal. Judgment and thought content normal.  Vitals reviewed.     BP 110/77   Pulse 82   Temp 97.1 F (36.2 C) (Oral)   Ht 5' 3.75" (1.619 m)   Wt 148 lb 3.2 oz (67.2 kg)   BMI 25.64 kg/m        Assessment & Plan:  1. Dysuria - Urinalysis, Complete  2. Interstitial cystitis (chronic) with hematuria Uribel Prescription sent to pharmacy  Force fluids RTO  Prn   Evelina Dun, [place]

## 2016-10-06 NOTE — Patient Instructions (Signed)
Interstitial Cystitis Introduction Interstitial cystitis is a condition that causes inflammation of the bladder. The bladder is a hollow organ in the lower part of your abdomen. It stores urine after the urine is made by your kidneys. With interstitial cystitis, you may have pain in the bladder area. You may also have a frequent and urgent need to urinate. The severity of interstitial cystitis can vary from person to person. You may have flare-ups of the condition, and then it may go away for a while. For many people who have this condition, it becomes a long-term problem. What are the causes? The cause of this condition is not known. What increases the risk? This condition is more likely to develop in women. What are the signs or symptoms? Symptoms of interstitial cystitis vary, and they can change over time. Symptoms may include:  Discomfort or pain in the bladder area. This can range from mild to severe. The pain may change in intensity as the bladder fills with urine or as it empties.  Pelvic pain.  An urgent need to urinate.  Frequent urination.  Pain during sexual intercourse.  Pinpoint bleeding on the bladder wall. For women, the symptoms often get worse during menstruation. How is this diagnosed? This condition is diagnosed by evaluating your symptoms and ruling out other causes. A physical exam will be done. Various tests may be done to rule out other conditions. Common tests include:  Urine tests.  Cystoscopy. In this test, a tool that is like a very thin telescope is used to look into your bladder.  Biopsy. This involves taking a sample of tissue from the bladder wall to be examined under a microscope. How is this treated? There is no cure for interstitial cystitis, but treatment methods are available to control your symptoms. Work closely with your health care provider to find the treatments that will be most effective for you. Treatment options may include:  Medicines  to relieve pain and to help reduce the number of times that you feel the need to urinate.  Bladder training. This involves learning ways to control when you urinate, such as:  Urinating at scheduled times.  Training yourself to delay urination.  Doing exercises (Kegel exercises) to strengthen the muscles that control urine flow.  Lifestyle changes, such as changing your diet or taking steps to control stress.  Use of a device that provides electrical stimulation in order to reduce pain.  A procedure that stretches your bladder by filling it with air or fluid.  Surgery. This is rare. It is only done for extreme cases if other treatments do not help. Follow these instructions at home:  Take medicines only as directed by your health care provider.  Use bladder training techniques as directed.  Keep a bladder diary to find out which foods, liquids, or activities make your symptoms worse.  Use your bladder diary to schedule bathroom trips. If you are away from home, plan to be near a bathroom at each of your scheduled times.  Make sure you urinate just before you leave the house and just before you go to bed.  Do Kegel exercises as directed by your health care provider.  Do not drink alcohol.  Do not use any tobacco products, including cigarettes, chewing tobacco, or electronic cigarettes. If you need help quitting, ask your health care provider.  Make dietary changes as directed by your health care provider. You may need to avoid spicy foods and foods that contain a high amount of potassium.    Limit your drinking of beverages that stimulate urination. These include soda, coffee, and tea.  Keep all follow-up visits as directed by your health care provider. This is important. Contact a health care provider if:  Your symptoms do not get better after treatment.  Your pain and discomfort are getting worse.  You have more frequent urges to urinate.  You have a fever. Get help  right away if:  You are not able to control your bladder at all. This information is not intended to replace advice given to you by your health care provider. Make sure you discuss any questions you have with your health care provider. Document Released: 06/10/2004 Document Revised: 03/17/2016 Document Reviewed: 06/17/2014  2017 Elsevier  

## 2016-10-25 ENCOUNTER — Other Ambulatory Visit: Payer: Self-pay | Admitting: Family

## 2016-10-25 DIAGNOSIS — K5900 Constipation, unspecified: Secondary | ICD-10-CM

## 2016-11-11 DIAGNOSIS — I7300 Raynaud's syndrome without gangrene: Secondary | ICD-10-CM | POA: Diagnosis not present

## 2016-11-11 DIAGNOSIS — M359 Systemic involvement of connective tissue, unspecified: Secondary | ICD-10-CM | POA: Diagnosis not present

## 2016-11-11 DIAGNOSIS — M329 Systemic lupus erythematosus, unspecified: Secondary | ICD-10-CM | POA: Diagnosis not present

## 2016-11-14 ENCOUNTER — Other Ambulatory Visit (HOSPITAL_COMMUNITY): Payer: Self-pay | Admitting: Psychiatry

## 2016-11-14 DIAGNOSIS — F3131 Bipolar disorder, current episode depressed, mild: Secondary | ICD-10-CM

## 2016-11-17 ENCOUNTER — Telehealth (HOSPITAL_COMMUNITY): Payer: Self-pay

## 2016-11-17 DIAGNOSIS — F3131 Bipolar disorder, current episode depressed, mild: Secondary | ICD-10-CM

## 2016-11-17 NOTE — Telephone Encounter (Signed)
Medication refill request - Fax received with refill request for patient's Alprazolam.  Last ordered 08/18/16 + 2 refills and patient returns on 11/22/16.

## 2016-11-18 NOTE — Telephone Encounter (Signed)
Please call in for 1 week supply until patient had appointment.

## 2016-11-21 MED ORDER — ALPRAZOLAM 0.5 MG PO TABS: 0.5000 mg | ORAL_TABLET | Freq: Every evening | ORAL | 0 refills | Status: DC | PRN

## 2016-11-21 NOTE — Telephone Encounter (Signed)
Called in one week supply of patient's prescribed Alprazolam 0.5 mg tablet to [facility] [address] with Bryson Ha, pharmacist per Dr. Adele Schilder order.  Left patient a message the one week supply was there.

## 2016-11-22 ENCOUNTER — Encounter (HOSPITAL_COMMUNITY): Payer: Self-pay | Admitting: Psychiatry

## 2016-11-22 ENCOUNTER — Ambulatory Visit (INDEPENDENT_AMBULATORY_CARE_PROVIDER_SITE_OTHER): Payer: 59 | Admitting: Psychiatry

## 2016-11-22 VITALS — BP 118/76 | HR 76 | Ht 64.0 in | Wt 146.8 lb

## 2016-11-22 DIAGNOSIS — Z90710 Acquired absence of both cervix and uterus: Secondary | ICD-10-CM

## 2016-11-22 DIAGNOSIS — F3131 Bipolar disorder, current episode depressed, mild: Secondary | ICD-10-CM

## 2016-11-22 DIAGNOSIS — Z79891 Long term (current) use of opiate analgesic: Secondary | ICD-10-CM

## 2016-11-22 DIAGNOSIS — Z880 Allergy status to penicillin: Secondary | ICD-10-CM

## 2016-11-22 DIAGNOSIS — Z8041 Family history of malignant neoplasm of ovary: Secondary | ICD-10-CM

## 2016-11-22 DIAGNOSIS — Z800 Family history of malignant neoplasm of digestive organs: Secondary | ICD-10-CM

## 2016-11-22 DIAGNOSIS — Z87891 Personal history of nicotine dependence: Secondary | ICD-10-CM

## 2016-11-22 DIAGNOSIS — Z803 Family history of malignant neoplasm of breast: Secondary | ICD-10-CM | POA: Diagnosis not present

## 2016-11-22 DIAGNOSIS — Z9889 Other specified postprocedural states: Secondary | ICD-10-CM

## 2016-11-22 DIAGNOSIS — Z9851 Tubal ligation status: Secondary | ICD-10-CM

## 2016-11-22 DIAGNOSIS — Z818 Family history of other mental and behavioral disorders: Secondary | ICD-10-CM | POA: Diagnosis not present

## 2016-11-22 DIAGNOSIS — Z9049 Acquired absence of other specified parts of digestive tract: Secondary | ICD-10-CM

## 2016-11-22 DIAGNOSIS — Z79899 Other long term (current) drug therapy: Secondary | ICD-10-CM

## 2016-11-22 DIAGNOSIS — Z888 Allergy status to other drugs, medicaments and biological substances status: Secondary | ICD-10-CM

## 2016-11-22 MED ORDER — ALPRAZOLAM 0.5 MG PO TABS: ORAL_TABLET | ORAL | 2 refills | Status: DC

## 2016-11-22 MED ORDER — LAMOTRIGINE 100 MG PO TABS: 100.0000 mg | ORAL_TABLET | Freq: Every day | ORAL | 2 refills | Status: DC

## 2016-11-22 NOTE — Progress Notes (Signed)
[facility] MD/PA/NP OP Progress Note  11/22/2016 1:16 PM Maureen [name]  MRN:  [id number]  Chief Complaint:  Subjective:  I'm feeling anxious and depressed.  Some nights I cannot sleep well.  I think they need to go up on Lamictal.  HPI: Maureen [name] came for her follow-up appointment.  She admitted increased anxiety and depression in recent weeks.  She is not sure what trigger the depression but admitted racing thoughts, poor sleep and unable to concentrate.  Though she denies any suicidal thoughts or homicidal thought but admitted easily frustrated and irritable.  She denies any mania or any hallucination.  She is going to work and reported no issues.  She is living alone but some days she spent time with her mother and rest of the week with her ex-husband.  She endorse her ex-husband is drinking so she does not want to stay with him but is still under him as a good friend.  Patient is also very attached to her children.  She is taking Xanax 0.5 mg at bedtime but some nights she feels that she need a second dose to help her sleep.  We also received records from [facility] and I review the records.  She had tried in the past lithium, Tegretol, Prozac, Abilify and with Ritalin.  As per record she had requested multiple times to cut down the dose because she does not want to take the medication.  I have discussed this issue with her and she agreed that in the past she was reluctant to take so many medication but now she realizes that she needed to take the medication to stabilize her mental illness.  Patient denies drinking alcohol or using any illegal substances.  Her appetite is okay.  Her vital signs are stable.  Patient also seen her rheumatologist last week.  She had blood work.  Her sodium was 138, potassium 4.0, chloride 100, glucose 91, creatinine 0.70 and her liver function tests were normal.  These blood work was drawn on November 11 2016.  Visit Diagnosis:    ICD-9-CM ICD-10-CM   1. Bipolar affective disorder,  currently depressed, mild ([place]) 296.51 F31.31     Past Psychiatric History: Reviewed. Patient has long history of drug addiction and diagnosed with depression and bipolar disorder in her early 24s.  She has one psychiatric hospitalization at age 51 when she took overdose on pills and require hospitalization.  She had another brief ER visit few years later when she threatened to kill herself.  She admitted history of severe mood swing, mania, impulsive behavior and depression.  She had tried lithium, Prozac, Zyprexa, Abilify and increase dose of Lamictal with limited response.  Patient developed mania symptoms with antidepressant.  She has seen psychiatrists at [facility] and then [facility].  Past Medical History:  Past Medical History:  Diagnosis Date  . Abdominal pain   . Anxiety    takes Xanax daily as needed  . Arthritis   . Cancer ([place])    MOLE PRE  . Cerebrospinal fluid leak from spinal puncture 08/24/2012  . Chronic back pain   . Constipation    takes Dulcolax and Miralax daily as needed;also Fiber Pills  . Depression    takes Lamictal daily  . Fibromyalgia   . GERD (gastroesophageal reflux disease)    takes Omeprazole daily  . Headache(784.0)   . Heart murmur    slight  . History of bronchitis    last  time many yrs ago(56yrs ago)  . Internal hemorrhoid   . Interstitial cystitis    no problems since beginning of Jan 2014  . Joint pain   . Joint swelling   . Lupus    takes Plaquenil daily  . Muscle spasm    takes Robaxin daily as needed  . Numbness and tingling in hands   . Osteoarthritis   . Pneumonia    hx of > 39yrs ago  . Raynaud's disease   . Urinary frequency   . Urinary urgency     Past Surgical History:  Procedure Laterality Date  . ABDOMINAL HERNIA REPAIR    . APPENDECTOMY    . CESAREAN SECTION    . CHOLECYSTECTOMY    . COLONOSCOPY    . DIAGNOSTIC LAPAROSCOPY     adhesions  . ENDOMETRIAL ABLATION     . ESOPHAGOGASTRODUODENOSCOPY N/A 05/08/2013   Procedure: ESOPHAGOGASTRODUODENOSCOPY (EGD);  Surgeon: Rogene Houston, MD;  Location[facility];  Service: Endoscopy;  Laterality: N/A;  315  . OVARIAN CYST REMOVAL    . SHOULDER SURGERY Right    x 2  . SPINAL CORD STIMULATOR INSERTION N/A 06/13/2014   Procedure: LUMBAR SPINAL CORD STIMULATOR INSERTION;  Surgeon: Bonna Gains, MD;  Location: [facility];  Service: Neurosurgery;  Laterality: N/A;  . SPINAL FUSION     x 2   . TONSILLECTOMY    . TUBAL LIGATION    . uterine ablation    . VAGINAL HYSTERECTOMY  8/12    Family Psychiatric History: Reviewed  Family History:  Family History  Problem Relation Age of Onset  . Hyperlipidemia Mother   . Depression Mother   . Anxiety disorder Mother   . Ovarian cancer Maternal Aunt   . Colon cancer Maternal Uncle   . Colon cancer Paternal Aunt   . Breast cancer Maternal Grandmother   . Alzheimer's disease Maternal Grandmother     Social History:  Patient born in [place] and raised in [place].  Her parents divorced when she was only 2 years old.  She was raised by her mother.  Patient never had lost contact with her father.  She has a sister who lives in [place].  Patient is separated from her husband because husband had addiction issues but she still has close contact and good support from him.  She has 52 year old twin daughters and 47 year old daughter.  She has 28-year-old grandchild.   Social History   Social History  . Marital status: Legally Separated    Spouse name: N/A  . Number of children: N/A  . Years of education: N/A   Social History Main Topics  . Smoking status: Former Smoker    Packs/day: 0.50    Years: 20.00    Types: Cigarettes    Quit date: 06/09/2008  . Smokeless tobacco: Never Used  . Alcohol use No  . Drug use: No  . Sexual activity: No   Other Topics Concern  . Not on file   Social History Narrative  . No narrative on file     Allergies:  Allergies  Allergen Reactions  . Amoxicillin Rash  . Penicillins Rash    Has patient had a PCN reaction causing immediate rash, facial/tongue/throat swelling, SOB or lightheadedness with hypotension: yes Has patient had a PCN reaction causing severe rash involving mucus membranes or skin necrosis: yes pt did experience rash but it was not severe  Has patient had a PCN reaction that required hospitalization: no Has  patient had a PCN reaction occurring within the last 10 years: no If all of the above answers are "NO", then may proceed with Cephalosporin use.   . Tegretol [Carbamazepine] Rash    Metabolic Disorder Labs: No results found for: HGBA1C, MPG No results found for: PROLACTIN No results found for: CHOL, TRIG, HDL, CHOLHDL, VLDL, LDLCALC   Current Medications: Current Outpatient Prescriptions  Medication Sig Dispense Refill  . ALPRAZolam (XANAX) 0.5 MG tablet Take 1 tablet (0.5 mg total) by mouth at bedtime as needed for anxiety. 7 tablet 0  . carisoprodol (SOMA) 350 MG tablet Take 350 mg by mouth 2 (two) times daily as needed for muscle spasms.   2  . gabapentin (NEURONTIN) 300 MG capsule Take 1,200 mg by mouth at bedtime.     . hydrochlorothiazide (HYDRODIURIL) 25 MG tablet TAKE 1 TABLET (25 MG TOTAL) BY MOUTH DAILY. 90 tablet 1  . HYDROcodone-acetaminophen (NORCO) 7.5-325 MG tablet Take 1 tablet by mouth every 6 (six) hours as needed for moderate pain. 120 tablet 0  . lamoTRIgine (LAMICTAL) 25 MG tablet TAKE 3 TABLETS EVERY EVENING 90 tablet 2  . Meth-Hyo-M Bl-Na Phos-Ph Sal (URIBEL) 118 MG CAPS Take 1 capsule (118 mg total) by mouth 3 (three) times daily as needed (bladder). 120 capsule 5  . ondansetron (ZOFRAN-ODT) 4 MG disintegrating tablet Take 1 tablet (4 mg total) by mouth every 8 (eight) hours as needed for nausea or vomiting. 20 tablet 1  . oxyCODONE-acetaminophen (PERCOCET/ROXICET) 5-325 MG tablet Take 1 tablet by mouth at bedtime as needed. Pain.  0   . polyethylene glycol (MIRALAX / GLYCOLAX) packet TAKE 17 G BY MOUTH DAILY. 30 packet 5   No current facility-administered medications for this visit.     Neurologic: Headache: No Seizure: No Paresthesias: No  Musculoskeletal: Strength & Muscle Tone: within normal limits Gait & Station: normal Patient leans: N/A  Psychiatric Specialty Exam: Review of Systems  Constitutional: Negative.   HENT: Negative.   Eyes: Negative.   Respiratory: Negative.   Cardiovascular: Negative.   Gastrointestinal: Negative.   Genitourinary: Negative.   Musculoskeletal: Positive for joint pain and neck pain.  Skin: Negative.   Neurological: Negative.   Endo/Heme/Allergies: Negative.   Psychiatric/Behavioral: Positive for depression. The patient is nervous/anxious and has insomnia.     Blood pressure 118/76, pulse 76, height 5\' 4"  (1.626 m), weight 146 lb 12.8 oz (66.6 kg), SpO2 99 %.Body mass index is 25.2 kg/m.  General Appearance: Casual  Eye Contact:  Good  Speech:  Clear and Coherent  Volume:  Normal  Mood:  Anxious and Depressed  Affect:  Constricted  Thought Process:  Goal Directed  Orientation:  Full (Time, Place, and Person)  Thought Content: Rumination   Suicidal Thoughts:  No  Homicidal Thoughts:  No  Memory:  Immediate;   Fair Recent;   Good Remote;   Good  Judgement:  Good  Insight:  Good  Psychomotor Activity:  Normal  Concentration:  Concentration: Fair and Attention Span: Fair  Recall:  Good  Fund of Knowledge: Good  Language: Good  Akathisia:  No  Handed:  Right  AIMS (if indicated):  0  Assets:  Communication Skills Desire for Improvement Housing Resilience  ADL's:  Intact  Cognition: WNL  Sleep:  fair   Assessment: Bipolar disorder type I.  ADD by history  Plan: I review her symptoms, current medication, records from [facility] and blood work results from her other providers.  Patient has history of cutting  down her medication in the past resulting in  decompensation.  Now she agreed and like to go back on Lamictal 100 mg.  She has no rash itching, tremors, shakes or any other side effects.  I would increase Lamictal 100 mg and also we will provide extra 5 tablets of Xanax that she can take as needed.  Discuss benzodiazepine dependence, tolerance and withdrawal.  Recommended to call us back if she has any question, concern if she feels worsening of the symptom.  Offer counseling but patient declined due to financial reasons.  Discuss safety plan that anytime having active suicidal thoughts or homicidal thoughts and she need to call 911 or go to local emergency room.  Follow-up in 3 months.   Randell Teare T., MD 11/22/2016, 1:16 PM

## 2016-11-28 DIAGNOSIS — L570 Actinic keratosis: Secondary | ICD-10-CM | POA: Diagnosis not present

## 2016-11-28 DIAGNOSIS — D225 Melanocytic nevi of trunk: Secondary | ICD-10-CM | POA: Diagnosis not present

## 2016-11-28 DIAGNOSIS — L820 Inflamed seborrheic keratosis: Secondary | ICD-10-CM | POA: Diagnosis not present

## 2016-12-07 DIAGNOSIS — M4312 Spondylolisthesis, cervical region: Secondary | ICD-10-CM | POA: Diagnosis not present

## 2016-12-07 DIAGNOSIS — M47812 Spondylosis without myelopathy or radiculopathy, cervical region: Secondary | ICD-10-CM | POA: Diagnosis not present

## 2016-12-07 DIAGNOSIS — M792 Neuralgia and neuritis, unspecified: Secondary | ICD-10-CM | POA: Diagnosis not present

## 2016-12-27 DIAGNOSIS — M545 Low back pain: Secondary | ICD-10-CM | POA: Diagnosis not present

## 2016-12-27 DIAGNOSIS — M4312 Spondylolisthesis, cervical region: Secondary | ICD-10-CM | POA: Diagnosis not present

## 2016-12-27 DIAGNOSIS — M1288 Other specific arthropathies, not elsewhere classified, other specified site: Secondary | ICD-10-CM | POA: Diagnosis not present

## 2016-12-29 ENCOUNTER — Telehealth (HOSPITAL_COMMUNITY): Payer: Self-pay

## 2016-12-29 MED ORDER — LAMOTRIGINE 25 MG PO TABS: 75.0000 mg | ORAL_TABLET | Freq: Every day | ORAL | 0 refills | Status: DC

## 2016-12-29 NOTE — Telephone Encounter (Signed)
Called patient and let her know that Dr. Adele Schilder said that 75 mg was fine. I sent a one month prescription to patients pharmacy and called her to let her know

## 2016-12-29 NOTE — Telephone Encounter (Signed)
Patient calling, she said at her last appointment her Lamictal was increased to 100 mg, patient states she only lasted a couple of weeks on that dose and she started to have adverse effects, so she went back to the 75 mg. She wants to know if she can get a refill on the 75 mg - she sees you on 4/2. Please advise, thank you

## 2017-01-19 ENCOUNTER — Ambulatory Visit (HOSPITAL_COMMUNITY): Payer: Self-pay | Admitting: Psychiatry

## 2017-01-23 ENCOUNTER — Ambulatory Visit (HOSPITAL_COMMUNITY): Payer: Self-pay | Admitting: Psychiatry

## 2017-01-25 ENCOUNTER — Other Ambulatory Visit (HOSPITAL_COMMUNITY): Payer: Self-pay | Admitting: Psychiatry

## 2017-01-30 ENCOUNTER — Other Ambulatory Visit (HOSPITAL_COMMUNITY): Payer: Self-pay

## 2017-01-30 MED ORDER — LAMOTRIGINE 25 MG PO TABS: 75.0000 mg | ORAL_TABLET | Freq: Every day | ORAL | 0 refills | Status: DC

## 2017-02-13 ENCOUNTER — Encounter (HOSPITAL_COMMUNITY): Payer: Self-pay | Admitting: Psychiatry

## 2017-02-13 ENCOUNTER — Ambulatory Visit (INDEPENDENT_AMBULATORY_CARE_PROVIDER_SITE_OTHER): Payer: 59 | Admitting: Psychiatry

## 2017-02-13 VITALS — BP 128/80 | HR 73 | Ht 63.25 in | Wt 148.6 lb

## 2017-02-13 DIAGNOSIS — Z818 Family history of other mental and behavioral disorders: Secondary | ICD-10-CM | POA: Diagnosis not present

## 2017-02-13 DIAGNOSIS — F3131 Bipolar disorder, current episode depressed, mild: Secondary | ICD-10-CM

## 2017-02-13 DIAGNOSIS — Z810 Family history of intellectual disabilities: Secondary | ICD-10-CM | POA: Diagnosis not present

## 2017-02-13 DIAGNOSIS — Z79899 Other long term (current) drug therapy: Secondary | ICD-10-CM | POA: Diagnosis not present

## 2017-02-13 DIAGNOSIS — Z87891 Personal history of nicotine dependence: Secondary | ICD-10-CM

## 2017-02-13 DIAGNOSIS — Z79891 Long term (current) use of opiate analgesic: Secondary | ICD-10-CM

## 2017-02-13 MED ORDER — LAMOTRIGINE 25 MG PO TABS: 75.0000 mg | ORAL_TABLET | Freq: Every day | ORAL | 2 refills | Status: DC

## 2017-02-13 MED ORDER — ALPRAZOLAM 0.5 MG PO TABS: ORAL_TABLET | ORAL | 2 refills | Status: DC

## 2017-02-13 NOTE — Progress Notes (Signed)
[facility] MD/PA/NP OP Progress Note  02/13/2017 1:39 PM Maureen [name]  MRN:  [id number]  Chief Complaint:  Subjective:  I tried increase Lamictal but it may be more irritable.  I am taking 75 mg and it is working.  HPI: Maureen [name] came for her follow-up appointment.  On her last visit we increase Lamictal 100 mg because she was complaining about irritability anger and frustration.  She tried 100 mg for some time but then she developed more manic-like symptoms and she decided to go back on 75 mg.  She had tried in the past 100 mg and she reduced after a few days because same symptoms happening.  She endorse her stress is coming from living with her ex husband.  She wants to give another try but she realized things are not going as well.  She decided if things do not get improved and she will go back to start living on her own.  She endorse going through menopause and some time having heart flashes.  She does not want to see OB/GYN or take any medication.  She is taking Xanax at bedtime but is helping her sleep and very occasion she takes extra dose in the morning and she is very anxious.  She denies any mania, psychosis, suicidal thoughts or any feeling of hopelessness or worthlessness.  She denies any tremors or shakes or any EPS.  Patient denies drinking alcohol or using any illegal substances.  Visit Diagnosis:    ICD-9-CM ICD-10-CM   1. Bipolar affective disorder, currently depressed, mild ([facility]) 296.51 F31.31 ALPRAZolam (XANAX) 0.5 MG tablet     lamoTRIgine (LAMICTAL) 25 MG tablet    Past Psychiatric History: Reviewed. Patient has long history of drug addiction and diagnosed with depression and bipolar disorder in her early 39s. She has one psychiatric hospitalization at age 51 when she took overdose on pills and require hospitalization. She had another brief ER visit few years later when she threatened to kill herself. She admitted history of severe mood swing, mania, impulsive behavior and depression.  She had tried lithium, Prozac, Zyprexa, Abilify and increase dose of Lamictal with limited response. Patient developed mania symptoms with antidepressant. She has seen psychiatrists at [facility] [place] and then [facility] [place] at [facility] mental health.  Past Medical History:  Past Medical History:  Diagnosis Date  . Abdominal pain   . Anxiety    takes Xanax daily as needed  . Arthritis   . Cancer ([place])    MOLE PRE  . Cerebrospinal fluid leak from spinal puncture 08/24/2012  . Chronic back pain   . Constipation    takes Dulcolax and Miralax daily as needed;also Fiber Pills  . Depression    takes Lamictal daily  . Fibromyalgia   . GERD (gastroesophageal reflux disease)    takes Omeprazole daily  . Headache(784.0)   . Heart murmur    slight  . History of bronchitis    last time many yrs ago(65yrs ago)  . Internal hemorrhoid   . Interstitial cystitis    no problems since beginning of Jan 2014  . Joint pain   . Joint swelling   . Lupus    takes Plaquenil daily  . Muscle spasm    takes Robaxin daily as needed  . Numbness and tingling in hands   . Osteoarthritis   . Pneumonia    hx of > 12yrs ago  . Raynaud's disease   . Urinary frequency   . Urinary urgency  Past Surgical History:  Procedure Laterality Date  . ABDOMINAL HERNIA REPAIR    . APPENDECTOMY    . CESAREAN SECTION    . CHOLECYSTECTOMY    . COLONOSCOPY    . DIAGNOSTIC LAPAROSCOPY     adhesions  . ENDOMETRIAL ABLATION    . ESOPHAGOGASTRODUODENOSCOPY N/A 05/08/2013   Procedure: ESOPHAGOGASTRODUODENOSCOPY (EGD);  Surgeon: Rogene Houston, MD;  Location: [facility];  Service: Endoscopy;  Laterality: N/A;  315  . OVARIAN CYST REMOVAL    . SHOULDER SURGERY Right    x 2  . SPINAL CORD STIMULATOR INSERTION N/A 06/13/2014   Procedure: LUMBAR SPINAL CORD STIMULATOR INSERTION;  Surgeon: Bonna Gains, MD;  Location: [facility];  Service: Neurosurgery;  Laterality: N/A;  . SPINAL FUSION     x 2    . TONSILLECTOMY    . TUBAL LIGATION    . uterine ablation    . VAGINAL HYSTERECTOMY  8/12    Family Psychiatric History: Reviewed.  Family History:  Family History  Problem Relation Age of Onset  . Hyperlipidemia Mother   . Depression Mother   . Anxiety disorder Mother   . Ovarian cancer Maternal Aunt   . Colon cancer Maternal Uncle   . Colon cancer Paternal Aunt   . Breast cancer Maternal Grandmother   . Alzheimer's disease Maternal Grandmother     Social History:  Social History   Social History  . Marital status: Legally Separated    Spouse name: N/A  . Number of children: N/A  . Years of education: N/A   Social History Main Topics  . Smoking status: Former Smoker    Packs/day: 0.50    Years: 20.00    Types: Cigarettes    Quit date: 06/09/2008  . Smokeless tobacco: Never Used  . Alcohol use No  . Drug use: No  . Sexual activity: No   Other Topics Concern  . None   Social History Narrative  . None    Allergies:  Allergies  Allergen Reactions  . Amoxicillin Rash  . Penicillins Rash    Has patient had a PCN reaction causing immediate rash, facial/tongue/throat swelling, SOB or lightheadedness with hypotension: yes Has patient had a PCN reaction causing severe rash involving mucus membranes or skin necrosis: yes pt did experience rash but it was not severe  Has patient had a PCN reaction that required hospitalization: no Has patient had a PCN reaction occurring within the last 10 years: no If all of the above answers are "NO", then may proceed with Cephalosporin use.   . Tegretol [Carbamazepine] Rash    Metabolic Disorder Labs: No results found for: HGBA1C, MPG No results found for: PROLACTIN No results found for: CHOL, TRIG, HDL, CHOLHDL, VLDL, LDLCALC   Current Medications: Current Outpatient Prescriptions  Medication Sig Dispense Refill  . ALPRAZolam (XANAX) 0.5 MG tablet Take 1 tab at bed time as 2nd as needed 35 tablet 2  . carisoprodol  (SOMA) 350 MG tablet Take 350 mg by mouth 2 (two) times daily as needed for muscle spasms.   2  . gabapentin (NEURONTIN) 300 MG capsule Take 1,200 mg by mouth at bedtime.     . hydrochlorothiazide (HYDRODIURIL) 25 MG tablet TAKE 1 TABLET (25 MG TOTAL) BY MOUTH DAILY. 90 tablet 1  . HYDROcodone-acetaminophen (NORCO) 7.5-325 MG tablet Take 1 tablet by mouth every 6 (six) hours as needed for moderate pain. 120 tablet 0  . lamoTRIgine (LAMICTAL) 25 MG tablet Take 3 tablets (  75 mg total) by mouth daily. 90 tablet 0  . Meth-Hyo-M Bl-Na Phos-Ph Sal (URIBEL) 118 MG CAPS Take 1 capsule (118 mg total) by mouth 3 (three) times daily as needed (bladder). 120 capsule 5  . ondansetron (ZOFRAN-ODT) 4 MG disintegrating tablet Take 1 tablet (4 mg total) by mouth every 8 (eight) hours as needed for nausea or vomiting. 20 tablet 1  . oxyCODONE-acetaminophen (PERCOCET/ROXICET) 5-325 MG tablet Take 1 tablet by mouth at bedtime as needed. Pain.  0  . polyethylene glycol (MIRALAX / GLYCOLAX) packet TAKE 17 G BY MOUTH DAILY. 30 packet 5  . predniSONE (DELTASONE) 1 MG tablet Take 2.5 mg by mouth at bedtime.  1   No current facility-administered medications for this visit.     Neurologic: Headache: No Seizure: No Paresthesias: No  Musculoskeletal: Strength & Muscle Tone: within normal limits Gait & Station: normal Patient leans: N/A  Psychiatric Specialty Exam: ROS  Blood pressure 128/80, pulse 73, height 5' 3.25" (1.607 m), weight 148 lb 9.6 oz (67.4 kg).Body mass index is 26.12 kg/m.  General Appearance: Casual  Eye Contact:  Good  Speech:  Normal Rate  Volume:  Normal  Mood:  Anxious  Affect:  Congruent  Thought Process:  Goal Directed  Orientation:  Full (Time, Place, and Person)  Thought Content: Logical and Rumination   Suicidal Thoughts:  No  Homicidal Thoughts:  No  Memory:  Immediate;   Good Recent;   Good Remote;   Good  Judgement:  Good  Insight:  Good  Psychomotor Activity:  Normal   Concentration:  Concentration: Fair and Attention Span: Fair  Recall:  Good  Fund of Knowledge: Good  Language: Good  Akathisia:  No  Handed:  Right  AIMS (if indicated):  0  Assets:  Communication Skills Desire for Improvement Housing Physical Health Resilience  ADL's:  Intact  Cognition: WNL  Sleep:  ok    Assessment: Bipolar disorder type I.  Plan: Patient like to continue Lamictal 75 mg because she tried 100 mg and that causes side effects.  Discuss psychosocial stressors.  Patient wants to give a little more time to her ex-husband as she is trying to reconcile.  She agreed that if things do not improve that she will live on her own.  I will continue Xanax 0.5 mg at bedtime and occasionally take the extra dose if she is very nervous.  We will provide 35 tablets for 30 days.  Discussed benzodiazepine dependence, tolerance and withdrawal.  Recommended to call us back if she has any question or any concern.  She is not interested in counseling due to her busy schedule.  Discuss safety plan that anytime having active suicidal thoughts or homicidal thoughts then she need to call 911 or go to the local emergency room.  Merline Perkin T., MD 02/13/2017, 1:39 PM

## 2017-02-14 DIAGNOSIS — M545 Low back pain: Secondary | ICD-10-CM | POA: Diagnosis not present

## 2017-02-14 DIAGNOSIS — M6281 Muscle weakness (generalized): Secondary | ICD-10-CM | POA: Diagnosis not present

## 2017-02-14 DIAGNOSIS — G8921 Chronic pain due to trauma: Secondary | ICD-10-CM | POA: Diagnosis not present

## 2017-02-20 DIAGNOSIS — G8921 Chronic pain due to trauma: Secondary | ICD-10-CM | POA: Diagnosis not present

## 2017-02-20 DIAGNOSIS — M6281 Muscle weakness (generalized): Secondary | ICD-10-CM | POA: Diagnosis not present

## 2017-02-20 DIAGNOSIS — M545 Low back pain: Secondary | ICD-10-CM | POA: Diagnosis not present

## 2017-02-23 DIAGNOSIS — M542 Cervicalgia: Secondary | ICD-10-CM | POA: Diagnosis not present

## 2017-02-23 DIAGNOSIS — M6281 Muscle weakness (generalized): Secondary | ICD-10-CM | POA: Diagnosis not present

## 2017-02-23 DIAGNOSIS — G8921 Chronic pain due to trauma: Secondary | ICD-10-CM | POA: Diagnosis not present

## 2017-02-23 DIAGNOSIS — M545 Low back pain: Secondary | ICD-10-CM | POA: Diagnosis not present

## 2017-03-02 DIAGNOSIS — G8921 Chronic pain due to trauma: Secondary | ICD-10-CM | POA: Diagnosis not present

## 2017-03-02 DIAGNOSIS — M545 Low back pain: Secondary | ICD-10-CM | POA: Diagnosis not present

## 2017-03-02 DIAGNOSIS — M542 Cervicalgia: Secondary | ICD-10-CM | POA: Diagnosis not present

## 2017-03-02 DIAGNOSIS — M6281 Muscle weakness (generalized): Secondary | ICD-10-CM | POA: Diagnosis not present

## 2017-03-06 DIAGNOSIS — G8921 Chronic pain due to trauma: Secondary | ICD-10-CM | POA: Diagnosis not present

## 2017-03-06 DIAGNOSIS — M545 Low back pain: Secondary | ICD-10-CM | POA: Diagnosis not present

## 2017-03-06 DIAGNOSIS — M6281 Muscle weakness (generalized): Secondary | ICD-10-CM | POA: Diagnosis not present

## 2017-03-06 DIAGNOSIS — M542 Cervicalgia: Secondary | ICD-10-CM | POA: Diagnosis not present

## 2017-03-09 DIAGNOSIS — M6281 Muscle weakness (generalized): Secondary | ICD-10-CM | POA: Diagnosis not present

## 2017-03-09 DIAGNOSIS — M545 Low back pain: Secondary | ICD-10-CM | POA: Diagnosis not present

## 2017-03-09 DIAGNOSIS — M542 Cervicalgia: Secondary | ICD-10-CM | POA: Diagnosis not present

## 2017-03-09 DIAGNOSIS — G8921 Chronic pain due to trauma: Secondary | ICD-10-CM | POA: Diagnosis not present

## 2017-03-15 DIAGNOSIS — M545 Low back pain: Secondary | ICD-10-CM | POA: Diagnosis not present

## 2017-03-15 DIAGNOSIS — M6281 Muscle weakness (generalized): Secondary | ICD-10-CM | POA: Diagnosis not present

## 2017-03-15 DIAGNOSIS — G8921 Chronic pain due to trauma: Secondary | ICD-10-CM | POA: Diagnosis not present

## 2017-03-15 DIAGNOSIS — M542 Cervicalgia: Secondary | ICD-10-CM | POA: Diagnosis not present

## 2017-03-27 DIAGNOSIS — M6281 Muscle weakness (generalized): Secondary | ICD-10-CM | POA: Diagnosis not present

## 2017-03-27 DIAGNOSIS — G8921 Chronic pain due to trauma: Secondary | ICD-10-CM | POA: Diagnosis not present

## 2017-03-27 DIAGNOSIS — Z9689 Presence of other specified functional implants: Secondary | ICD-10-CM | POA: Diagnosis not present

## 2017-03-27 DIAGNOSIS — M4722 Other spondylosis with radiculopathy, cervical region: Secondary | ICD-10-CM | POA: Diagnosis not present

## 2017-03-27 DIAGNOSIS — M542 Cervicalgia: Secondary | ICD-10-CM | POA: Diagnosis not present

## 2017-03-27 DIAGNOSIS — M545 Low back pain: Secondary | ICD-10-CM | POA: Diagnosis not present

## 2017-03-30 DIAGNOSIS — M6281 Muscle weakness (generalized): Secondary | ICD-10-CM | POA: Diagnosis not present

## 2017-03-30 DIAGNOSIS — G8921 Chronic pain due to trauma: Secondary | ICD-10-CM | POA: Diagnosis not present

## 2017-03-30 DIAGNOSIS — M545 Low back pain: Secondary | ICD-10-CM | POA: Diagnosis not present

## 2017-03-30 DIAGNOSIS — M542 Cervicalgia: Secondary | ICD-10-CM | POA: Diagnosis not present

## 2017-04-06 DIAGNOSIS — M545 Low back pain: Secondary | ICD-10-CM | POA: Diagnosis not present

## 2017-04-06 DIAGNOSIS — M542 Cervicalgia: Secondary | ICD-10-CM | POA: Diagnosis not present

## 2017-04-06 DIAGNOSIS — G8921 Chronic pain due to trauma: Secondary | ICD-10-CM | POA: Diagnosis not present

## 2017-04-06 DIAGNOSIS — M6281 Muscle weakness (generalized): Secondary | ICD-10-CM | POA: Diagnosis not present

## 2017-04-20 DIAGNOSIS — M6281 Muscle weakness (generalized): Secondary | ICD-10-CM | POA: Diagnosis not present

## 2017-04-20 DIAGNOSIS — M542 Cervicalgia: Secondary | ICD-10-CM | POA: Diagnosis not present

## 2017-04-20 DIAGNOSIS — G8921 Chronic pain due to trauma: Secondary | ICD-10-CM | POA: Diagnosis not present

## 2017-04-20 DIAGNOSIS — M545 Low back pain: Secondary | ICD-10-CM | POA: Diagnosis not present

## 2017-05-04 DIAGNOSIS — M542 Cervicalgia: Secondary | ICD-10-CM | POA: Diagnosis not present

## 2017-05-04 DIAGNOSIS — M4312 Spondylolisthesis, cervical region: Secondary | ICD-10-CM | POA: Diagnosis not present

## 2017-05-04 DIAGNOSIS — M47812 Spondylosis without myelopathy or radiculopathy, cervical region: Secondary | ICD-10-CM | POA: Diagnosis not present

## 2017-05-15 ENCOUNTER — Ambulatory Visit (HOSPITAL_COMMUNITY): Payer: Self-pay | Admitting: Psychiatry

## 2017-05-19 ENCOUNTER — Ambulatory Visit (INDEPENDENT_AMBULATORY_CARE_PROVIDER_SITE_OTHER): Payer: 59 | Admitting: Family

## 2017-05-19 ENCOUNTER — Encounter: Payer: Self-pay | Admitting: Family

## 2017-05-19 VITALS — BP 112/84 | HR 84 | Temp 98.9°F | Ht 63.5 in | Wt 150.4 lb

## 2017-05-19 DIAGNOSIS — M130 Polyarthritis, unspecified: Secondary | ICD-10-CM | POA: Diagnosis not present

## 2017-05-19 DIAGNOSIS — M329 Systemic lupus erythematosus, unspecified: Secondary | ICD-10-CM

## 2017-05-19 MED ORDER — KETOROLAC TROMETHAMINE 60 MG/2ML IM SOLN
60.0000 mg | Freq: Once | INTRAMUSCULAR | Status: AC
  Administered 2017-05-19: 60 mg via INTRAMUSCULAR

## 2017-05-19 MED ORDER — KETOROLAC TROMETHAMINE 10 MG PO TABS: 10.0000 mg | ORAL_TABLET | Freq: Four times a day (QID) | ORAL | 0 refills | Status: DC | PRN

## 2017-05-19 NOTE — Progress Notes (Signed)
   Subjective:    Patient ID: Maureen [name], female    DOB: [birth year], 51 y.o.   MRN: [id number]  HPI Pt presents to the office today for Lupus flare up that started in 04/18 with pain in bilateral hands, feet, knees, and elbows. Pt is followed by Rheumatology and has appt 08/29. They have adjusted her prednisone to 5 mg daily. Pt had taken 10 mg daily for four days then decreased to 5 mg last week. She has tried Mobic without relief. Pt states she tapered her Plaquenil, Azathioprine, and Quinacrine in January 2018.   Pt is requesting Toradol shot today. Pt is tearful today and states she does not know what to do because this is interfering with her job and daily life. PT requesting new referral the Rheumatologists that is closer to where she works in [place].   *Reviewed pt's Rheumatologists office visits.    Review of Systems  Musculoskeletal: Positive for arthralgias and joint swelling.  Skin: Positive for rash (slight rash on chest and face).  All other systems reviewed and are negative.      Objective:   Physical Exam  Constitutional: She is oriented to person, place, and time. She appears well-developed and well-nourished. No distress.  HENT:  Head: Normocephalic.  Cardiovascular: Normal rate, regular rhythm, normal heart sounds and intact distal pulses.   No murmur heard. Pulmonary/Chest: Effort normal and breath sounds normal. No respiratory distress. She has no wheezes.  Abdominal: Soft. Bowel sounds are normal. She exhibits no distension. There is no tenderness.  Musculoskeletal: Normal range of motion. She exhibits tenderness. She exhibits no edema.  Generalized joint pain in hands, elbow, wrist, knees, and ankle  Neurological: She is alert and oriented to person, place, and time.  Skin: Skin is warm and dry.  Psychiatric: She has a normal mood and affect. Her behavior is normal. Judgment and thought content normal.  Vitals reviewed.     BP 112/84   Pulse 84    Temp 98.9 F (37.2 C) (Oral)   Ht 5' 3.5" (1.613 m)   Wt 150 lb 6.4 oz (68.2 kg)   BMI 26.22 kg/m      Assessment & Plan:  1. Systemic lupus erythematosus, unspecified SLE type, unspecified organ involvement status (HCC) - ketorolac (TORADOL) injection 60 mg; Inject 2 mLs (60 mg total) into the muscle once. - ketorolac (TORADOL) 10 MG tablet; Take 1 tablet (10 mg total) by mouth every 6 (six) hours as needed. For 4 days  Dispense: 16 tablet; Refill: 0 - Ambulatory referral to Rheumatology  2. Polyarthritis - ketorolac (TORADOL) injection 60 mg; Inject 2 mLs (60 mg total) into the muscle once. - ketorolac (TORADOL) 10 MG tablet; Take 1 tablet (10 mg total) by mouth every 6 (six) hours as needed. For 4 days  Dispense: 16 tablet; Refill: 0  Continue prednisone Referral placed for new Rheumatologists  Continue medication for Bipolar  Stop Mobic and start Toradol  Pt encouraged to make CPE appt for lab work and to discuss health maintenance    Evelina Dun, FNP

## 2017-05-19 NOTE — Patient Instructions (Signed)
Systemic Lupus Erythematosus, Adult Systemic lupus erythematosus is a long-term (chronic) disease that can affect many parts of the body. It can damage the skin, joints, blood vessels, brain, kidneys, lungs, heart, and other internal organs. It causes pain, irritation, and inflammation. Systemic lupus erythematosus is an autoimmune disease. With this type of disease, the body's defense system (immune system) mistakenly attacks normal tissues instead of attacking germs or abnormal growths. What are the causes? The cause of this condition is not known. What increases the risk? This condition is more likely to develop in:  Females.  People of Asian descent.  People of African-American descent.  People who have a family history of the condition.  What are the signs or symptoms? General symptoms include:  Joint pain and swelling (common).  Fever.  Fatigue.  Unusual weight loss or weight gain.  Skin rashes, especially over the nose and cheeks (butterfly rash) and after sun exposure.  Sores inside the mouth or nose.  Other symptoms depend on which parts of the body are affected. They can include:  Shortness of breath.  Chest pain.  Frequent urination.  Blood in the urine.  Seizures.  Mental changes.  Hair loss.  Swollen and tender lymph nodes.  Swelling of the hands or feet.  Symptoms can come and go. A period of time when symptoms get worse or come back is called a flare. A period of time with no symptoms is called a remission. How is this diagnosed? This condition is diagnosed based on symptoms, a medical history, and a physical exam. You may also have tests, including:  Blood tests.  Urine tests.  A chest X-ray.  A skin or kidney biopsy. For this test, a sample of tissue is taken from the skin or kidney and studied under a microscope.  You may be referred to an autoimmune disease specialist (rheumatologist). How is this treated? There is no cure for this  condition, but treatment can keep the disease in remission, help to control symptoms, and prevent damage to the heart, lungs, kidneys, and other organs. Treatment may involve taking a combination of medicines over time. Follow these instructions at home: Medicines  Take medicines only as directed by your health care provider.  Do not take any medicines that contain estrogen without first checking with your health care provider. Estrogen can trigger flares and may increase your risk for blood clots. Lifestyle  Eat a heart-healthy diet.  Stay active as directed by your health care provider.  Do not smoke. If you need help quitting, ask your health care provider.  Protect your skin from the sun by applying sunblock and wearing protective hats and clothing.  Learn as much as you can about your condition and have a good support system in place. Support may come from family, friends, or a lupus support group. General instructions  Keep all follow-up visits as directed by your health care provider. This is important.  Work closely with all of your health care providers to manage your condition.  Let your health care provider know right away if you become pregnant or if you plan to become pregnant. Pregnancy in women with this condition is considered high risk. Contact a health care provider if:  You have a fever.  Your symptoms flare.  You develop new symptoms.  You develop swollen feet or hands.  You develop puffiness around your eyes.  Your medicines are not working.  You have bloody, foamy, or coffee-colored urine.  There are changes in your   urination. For example, you urinate more often at night.  You think that you may be depressed or have anxiety. Get help right away if:  You have chest pain.  You have trouble breathing.  You have a seizure.  You suddenly get a very bad headache.  You suddenly develop facial or body weakness.  You cannot speak.  You cannot  understand speech. This information is not intended to replace advice given to you by your health care provider. Make sure you discuss any questions you have with your health care provider. Document Released: 09/30/2002 Document Revised: 06/05/2016 Document Reviewed: 09/17/2014 Elsevier Interactive Patient Education  2018 Elsevier Inc.  

## 2017-05-23 ENCOUNTER — Ambulatory Visit (INDEPENDENT_AMBULATORY_CARE_PROVIDER_SITE_OTHER): Payer: 59 | Admitting: Psychiatry

## 2017-05-23 ENCOUNTER — Encounter (HOSPITAL_COMMUNITY): Payer: Self-pay | Admitting: Psychiatry

## 2017-05-23 VITALS — BP 112/68 | HR 83 | Ht 64.0 in | Wt 149.8 lb

## 2017-05-23 DIAGNOSIS — Z818 Family history of other mental and behavioral disorders: Secondary | ICD-10-CM

## 2017-05-23 DIAGNOSIS — F3131 Bipolar disorder, current episode depressed, mild: Secondary | ICD-10-CM

## 2017-05-23 DIAGNOSIS — M2550 Pain in unspecified joint: Secondary | ICD-10-CM | POA: Diagnosis not present

## 2017-05-23 DIAGNOSIS — F410 Panic disorder [episodic paroxysmal anxiety] without agoraphobia: Secondary | ICD-10-CM

## 2017-05-23 DIAGNOSIS — F419 Anxiety disorder, unspecified: Secondary | ICD-10-CM

## 2017-05-23 DIAGNOSIS — Z810 Family history of intellectual disabilities: Secondary | ICD-10-CM | POA: Diagnosis not present

## 2017-05-23 DIAGNOSIS — Z87891 Personal history of nicotine dependence: Secondary | ICD-10-CM

## 2017-05-23 MED ORDER — LAMOTRIGINE 25 MG PO TABS: 75.0000 mg | ORAL_TABLET | Freq: Every day | ORAL | 1 refills | Status: DC

## 2017-05-23 MED ORDER — LURASIDONE HCL 40 MG PO TABS: 40.0000 mg | ORAL_TABLET | Freq: Every day | ORAL | 1 refills | Status: DC

## 2017-05-23 MED ORDER — ALPRAZOLAM 0.5 MG PO TABS: ORAL_TABLET | ORAL | 1 refills | Status: DC

## 2017-05-23 NOTE — Progress Notes (Signed)
BH MD/PA/NP OP Progress Note  05/23/2017 2:09 PM Maureen [name]  MRN:  [id number]  Chief Complaint:  Subjective:  I am not doing good.  I am very emotional and having panic attacks.  HPI: Patient came for her follow-up appointment.  She has been under a lot of stress and having crying spells.  She also had a panic attack a few weeks ago.  She is concerned about her lupus which has a flareup and recently given high dose of steroids.  She doesn't do very well with the higher dose of steroids.  She is having crying spells, poor sleep, irritability and anxiousness.  She is also disappointed because she was not promoted at work.  She endorse her job environment is very toxic but she has no other choice at this time.  She is taking Xanax.  She is not happy with her current rheumatology and like to get a referral in [place].  She had appointment at [facility] rheumatology in October.  She is working as a Passenger transport manager at [employer].  She continues to live with her husband in same house but separated.  She has no other choice as she cannot afford living on her own.  She admitted sometime feeling hopeless helpless but denies any suicidal thoughts.  She is taking Lamictal and Xanax.  We tried higher dose Lamictal but she could not tolerate.  Patient denies drinking alcohol or using any illegal substances.  Her appetite is okay.  Her energy level is fair.  Her vital signs are stable.    Visit Diagnosis:    ICD-10-CM   1. Bipolar affective disorder, currently depressed, mild (HCC) F31.31 ALPRAZolam (XANAX) 0.5 MG tablet    lamoTRIgine (LAMICTAL) 25 MG tablet    lurasidone (LATUDA) 40 MG TABS tablet    Past Psychiatric History: Reviewed. Patient has long history of drug addiction and diagnosed with depression and bipolar disorder in her early 38s. She has one psychiatric hospitalization at age 53 when she took overdose on pills and require hospitalization. She had another brief ER visit few  years later when she threatened to kill herself. She admitted history of severe mood swing, mania, impulsive behavior and depression. She had tried lithium, Prozac, Zyprexa, Abilify and increase dose of Lamictal with limited response. Patient developed mania symptoms with antidepressant. She has seen psychiatrists at [facility] and then day [place] [place] at [facility] mental health.  Past Medical History:  Past Medical History:  Diagnosis Date  . Abdominal pain   . Anxiety    takes Xanax daily as needed  . Arthritis   . Cancer ([place])    MOLE PRE  . Cerebrospinal fluid leak from spinal puncture 08/24/2012  . Chronic back pain   . Constipation    takes Dulcolax and Miralax daily as needed;also Fiber Pills  . Depression    takes Lamictal daily  . Fibromyalgia   . GERD (gastroesophageal reflux disease)    takes Omeprazole daily  . Headache(784.0)   . Heart murmur    slight  . History of bronchitis    last time many yrs ago(63yrs ago)  . Internal hemorrhoid   . Interstitial cystitis    no problems since beginning of Jan 2014  . Joint pain   . Joint swelling   . Lupus    takes Plaquenil daily  . Muscle spasm    takes Robaxin daily as needed  . Numbness and tingling in hands   . Osteoarthritis   .  Pneumonia    hx of > 5yrs ago  . Raynaud's disease   . Urinary frequency   . Urinary urgency     Past Surgical History:  Procedure Laterality Date  . ABDOMINAL HERNIA REPAIR    . APPENDECTOMY    . CESAREAN SECTION    . CHOLECYSTECTOMY    . COLONOSCOPY    . DIAGNOSTIC LAPAROSCOPY     adhesions  . ENDOMETRIAL ABLATION    . ESOPHAGOGASTRODUODENOSCOPY N/A 05/08/2013   Procedure: ESOPHAGOGASTRODUODENOSCOPY (EGD);  Surgeon: Rogene Houston, MD;  Location: [facility];  Service: Endoscopy;  Laterality: N/A;  315  . OVARIAN CYST REMOVAL    . SHOULDER SURGERY Right    x 2  . SPINAL CORD STIMULATOR INSERTION N/A 06/13/2014   Procedure: LUMBAR SPINAL CORD STIMULATOR  INSERTION;  Surgeon: Bonna Gains, MD;  Location: [facility]URO [facility]S;  Service: Neurosurgery;  Laterality: N/A;  . SPINAL FUSION     x 2   . TONSILLECTOMY    . TUBAL LIGATION    . uterine ablation    . VAGINAL HYSTERECTOMY  8/12    Family Psychiatric History: Reviewed.  Family History:  Family History  Problem Relation Age of Onset  . Hyperlipidemia Mother   . Depression Mother   . Anxiety disorder Mother   . Ovarian cancer Maternal Aunt   . Colon cancer Maternal Uncle   . Colon cancer Paternal Aunt   . Breast cancer Maternal Grandmother   . Alzheimer's disease Maternal Grandmother     Social History:  Social History   Social History  . Marital status: Legally Separated    Spouse name: N/A  . Number of children: N/A  . Years of education: N/A   Social History Main Topics  . Smoking status: Former Smoker    Packs/day: 0.50    Years: 20.00    Types: Cigarettes    Quit date: 06/09/2008  . Smokeless tobacco: Never Used  . Alcohol use No  . Drug use: No  . Sexual activity: No   Other Topics Concern  . Not on file   Social History Narrative  . No narrative on file    Allergies:  Allergies  Allergen Reactions  . Amoxicillin Rash  . Penicillins Rash    Has patient had a PCN reaction causing immediate rash, facial/tongue/throat swelling, SOB or lightheadedness with hypotension: yes Has patient had a PCN reaction causing severe rash involving mucus membranes or skin necrosis: yes pt did experience rash but it was not severe  Has patient had a PCN reaction that required hospitalization: no Has patient had a PCN reaction occurring within the last 10 years: no If all of the above answers are "NO", then may proceed with Cephalosporin use.   . Tegretol [Carbamazepine] Rash    Metabolic Disorder Labs: No results found for: HGBA1C, MPG No results found for: PROLACTIN No results found for: CHOL, TRIG, HDL, CHOLHDL, VLDL, LDLCALC   Current Medications: Current  Outpatient Prescriptions  Medication Sig Dispense Refill  . ALPRAZolam (XANAX) 0.5 MG tablet Take 1 tab at bed time as 2nd as needed 35 tablet 2  . carisoprodol (SOMA) 350 MG tablet Take 350 mg by mouth 2 (two) times daily as needed for muscle spasms.   2  . gabapentin (NEURONTIN) 300 MG capsule Take 1,200 mg by mouth at bedtime.     . hydrochlorothiazide (HYDRODIURIL) 25 MG tablet TAKE 1 TABLET (25 MG TOTAL) BY MOUTH DAILY. 90 tablet 1  .  HYDROcodone-acetaminophen (NORCO) 7.5-325 MG tablet Take 1 tablet by mouth every 6 (six) hours as needed for moderate pain. 120 tablet 0  . ketorolac (TORADOL) 10 MG tablet Take 1 tablet (10 mg total) by mouth every 6 (six) hours as needed. For 4 days 16 tablet 0  . lamoTRIgine (LAMICTAL) 25 MG tablet Take 3 tablets (75 mg total) by mouth daily. 90 tablet 2  . meloxicam (MOBIC) 15 MG tablet Take 15 mg by mouth.    . Meth-Hyo-M Bl-Na Phos-Ph Sal (URIBEL) 118 MG CAPS Take 1 capsule (118 mg total) by mouth 3 (three) times daily as needed (bladder). (Patient not taking: Reported on 05/19/2017) 120 capsule 5  . ondansetron (ZOFRAN-ODT) 4 MG disintegrating tablet Take 1 tablet (4 mg total) by mouth every 8 (eight) hours as needed for nausea or vomiting. (Patient not taking: Reported on 05/19/2017) 20 tablet 1  . oxyCODONE-acetaminophen (PERCOCET/ROXICET) 5-325 MG tablet Take 1 tablet by mouth at bedtime as needed. Pain.  0  . polyethylene glycol (MIRALAX / GLYCOLAX) packet TAKE 17 G BY MOUTH DAILY. 30 packet 5  . predniSONE (DELTASONE) 5 MG tablet prednisone 10 mg daily for 4 days then 5 mg daily for now.     No current facility-administered medications for this visit.     Neurologic: Headache: No Seizure: No Paresthesias: No  Musculoskeletal: Strength & Muscle Tone: within normal limits Gait & Station: normal Patient leans: N/A  Psychiatric Specialty Exam: Review of Systems  Constitutional: Negative.   HENT: Negative.   Gastrointestinal: Negative.    Genitourinary: Negative.   Musculoskeletal: Positive for joint pain.  Skin: Negative.  Negative for itching and rash.  Neurological: Negative.   Psychiatric/Behavioral: Positive for depression. The patient has insomnia.     Blood pressure 112/68, pulse 83, height 5\' 4"  (1.626 m), weight 149 lb 12.8 oz (67.9 kg).There is no height or weight on file to calculate BMI.  General Appearance: Casual and tearful  Eye Contact:  Good  Speech:  Slow  Volume:  Normal  Mood:  Anxious and Dysphoric  Affect:  Labile  Thought Process:  Goal Directed  Orientation:  Full (Time, Place, and Person)  Thought Content: Rumination   Suicidal Thoughts:  No  Homicidal Thoughts:  No  Memory:  Immediate;   Good Recent;   Good Remote;   Good  Judgement:  Good  Insight:  Good  Psychomotor Activity:  Decreased  Concentration:  Concentration: Fair and Attention Span: Fair  Recall:  Good  Fund of Knowledge: Good  Language: Good  Akathisia:  No  Handed:  Right  AIMS (if indicated):  0  Assets:  Communication Skills Desire for Improvement Housing Resilience Talents/Skills  ADL's:  Intact  Cognition: WNL  Sleep:  fair   Assessment: Bipolar disorder type I.  Anxiety disorder NOS.  Panic attacks.  Plan: I reviewed records from other providers, recent psychosocial stressors, current medication.  Patient is taking higher dose of prednisone due to recent flareup and that may be causing her more emotional.  She is hoping to see a rheumatologist very soon.  We tried higher dose of Lamictal but she could not tolerate.  I recommended to try Latuda.  We will start 20 mg for 2 weeks and then 40 mg.  I provided samples and also called a new prescription at her pharmacy.  She will continue Xanax 0.5 mg at bedtime.  I recommended to try Klonopin but she does not want to change her benzodiazepine.  Discuss benzodiazepine dependence,  tolerance, withdrawal.  Continue Lamictal 75 mg daily.  One more time I encouraged to see  a therapist but at this time she cannot afford counseling.  Discuss safety concern that anytime having active suicidal thoughts or homicidal.  Continue to call 911 or the local emergency room.  Follow-up in 2 months.  Raekwan Spelman T., MD 05/23/2017, 2:09 PM

## 2017-06-21 ENCOUNTER — Telehealth (HOSPITAL_COMMUNITY): Payer: Self-pay

## 2017-06-21 DIAGNOSIS — Z79899 Other long term (current) drug therapy: Secondary | ICD-10-CM | POA: Diagnosis not present

## 2017-06-21 DIAGNOSIS — I7300 Raynaud's syndrome without gangrene: Secondary | ICD-10-CM | POA: Diagnosis not present

## 2017-06-21 DIAGNOSIS — M359 Systemic involvement of connective tissue, unspecified: Secondary | ICD-10-CM | POA: Diagnosis not present

## 2017-06-21 DIAGNOSIS — Z5181 Encounter for therapeutic drug level monitoring: Secondary | ICD-10-CM | POA: Diagnosis not present

## 2017-06-21 NOTE — Telephone Encounter (Signed)
Patient called and said that she is in sever manic mode - she is currently taking prednisone 4 mg - but she does not think that is related. She said that she has been manic for a week now, she is having crying spells and agitation and severe mood swings. Please review and advise, thank you

## 2017-06-22 ENCOUNTER — Other Ambulatory Visit (HOSPITAL_COMMUNITY): Payer: Self-pay | Admitting: Psychiatry

## 2017-06-22 DIAGNOSIS — M545 Low back pain: Secondary | ICD-10-CM | POA: Diagnosis not present

## 2017-06-22 DIAGNOSIS — Z9689 Presence of other specified functional implants: Secondary | ICD-10-CM | POA: Diagnosis not present

## 2017-06-22 DIAGNOSIS — R030 Elevated blood-pressure reading, without diagnosis of hypertension: Secondary | ICD-10-CM | POA: Diagnosis not present

## 2017-06-22 NOTE — Telephone Encounter (Signed)
I returned patient's phone call.  She is taking prednisone which is causing more manic symptoms.  She is cut down Latuda from 40-20 because she believed it may be contributing to mania.  She like to try 20 mg for few days and if symptoms do not improve then we talked about switching to Seroquel.

## 2017-06-30 ENCOUNTER — Encounter: Payer: Self-pay | Admitting: Family Medicine

## 2017-06-30 ENCOUNTER — Ambulatory Visit (INDEPENDENT_AMBULATORY_CARE_PROVIDER_SITE_OTHER): Payer: 59 | Admitting: Family Medicine

## 2017-06-30 VITALS — BP 118/82 | HR 73 | Temp 97.3°F | Ht 63.5 in | Wt 152.6 lb

## 2017-06-30 DIAGNOSIS — R220 Localized swelling, mass and lump, head: Secondary | ICD-10-CM

## 2017-06-30 DIAGNOSIS — J3489 Other specified disorders of nose and nasal sinuses: Secondary | ICD-10-CM | POA: Diagnosis not present

## 2017-06-30 DIAGNOSIS — H02843 Edema of right eye, unspecified eyelid: Secondary | ICD-10-CM | POA: Diagnosis not present

## 2017-06-30 MED ORDER — CLINDAMYCIN HCL 300 MG PO CAPS: 300.0000 mg | ORAL_CAPSULE | Freq: Three times a day (TID) | ORAL | 0 refills | Status: DC

## 2017-06-30 NOTE — Progress Notes (Addendum)
   HPI  Patient presents today here with tongue swelling and a stye  Patient states that she developed redness of the right eye and swelling of the hard palate of the mouth on Wednesday last week, on Monday she began taking Imuran again for lupus.  She states that she had the hard palate swelling frequently in the beginning of her lupus issues.  Patient states that she began using a warm compress of the right eye to treat a sty. Her hard palate swelling has improved somewhat, however she has slight tenderness and swelling under her tongue now.  She denies fever, chills, sweats. She is down to 3 mg of prednisone. She has difficulty tolerating prednisone due to bipolar disorder.  Nose pain Patient complains of paresthesias of the nose whenever she wears glasses, she has to wear them frequently for work now. She states that she feels this may be related to previous broken noses, she's had a broken nose on 2 separate occasions. She's interested in seeing a specialist to discuss this.  PMH: Smoking status noted ROS: Per HPI  Objective: BP 118/82   Pulse 73   Temp (!) 97.3 F (36.3 C) (Oral)   Ht 5' 3.5" (1.613 m)   Wt 152 lb 9.6 oz (69.2 kg)   BMI 26.61 kg/m  Gen: NAD, alert, cooperative with exam HEENT: NCAT, right eye with erythema stretching from the lateral border of the eyelid to the medial side. Slight swelling of the right eyelid upper, no tenderness to palpation of the orbit, and EOMI, no conjunctival injection Patient does have redness of the area posterior and superior to the front 2 teeth CV: RRR, good S1/S2, no murmur Resp: CTABL, no wheezes, non-labored Ext: No edema, warm Neuro: Alert and oriented, No gross deficits  Assessment and plan:  # Swelling of the right eyelid, tongue swelling Acute new problems Patient with symptoms beginning on the same day, 2 days after starting immunosuppressive. She has responded moderately to warm compresses, however given  immunosuppression I am concerned about preseptal cellulitis or even cellulitis of the tongue Treating with clindamycin CBC, CMP Low threshold for seeking further care if symptoms worsen or do not improve  # nose pain New problem to me Patient describes paresthesias and intolerance of glasses, she cannot use contact lenses due to lupus-induced dry eyes She has history of nasal fracture Possible anatomical explanation, recommended ENT referral, this is been placed    Orders Placed This Encounter  Procedures  . CBC with Differential/Platelet  . CMP14+EGFR  . Ambulatory referral to ENT    Referral Priority:   Routine    Referral Type:   Consultation    Referral Reason:   Specialty Services Required    Requested Specialty:   Otolaryngology    Number of Visits Requested:   1    Meds ordered this encounter  Medications  . azaTHIOprine (IMURAN) 50 MG tablet    Sig: Take 75 mg by mouth daily.  . clindamycin (CLEOCIN) 300 MG capsule    Sig: Take 1 capsule (300 mg total) by mouth 3 (three) times daily.    Dispense:  21 capsule    Refill:  Clyde, MD [facility] 06/30/2017, 4:25 PM

## 2017-06-30 NOTE — Patient Instructions (Signed)
Great to meet you!  Take a pro-biotic twice daily while you are on clindamycin.   I am treating you for a possible Eyelid infection. Please seek emergency medical help if you develop severe eye pain, redness of the white of the eye, or change in vision.  He is also seek emergent medical care if he developed severe tongue swelling or swelling of the throat.

## 2017-07-01 LAB — CBC WITH DIFFERENTIAL/PLATELET
BASOS: 0 %
Basophils Absolute: 0 10*3/uL (ref 0.0–0.2)
EOS (ABSOLUTE): 0 10*3/uL (ref 0.0–0.4)
EOS: 1 %
HEMATOCRIT: 37.2 % (ref 34.0–46.6)
Hemoglobin: 12.2 g/dL (ref 11.1–15.9)
Immature Grans (Abs): 0 10*3/uL (ref 0.0–0.1)
Immature Granulocytes: 0 %
LYMPHS ABS: 1.5 10*3/uL (ref 0.7–3.1)
Lymphs: 39 %
MCH: 31 pg (ref 26.6–33.0)
MCHC: 32.8 g/dL (ref 31.5–35.7)
MCV: 95 fL (ref 79–97)
MONOS ABS: 0.2 10*3/uL (ref 0.1–0.9)
Monocytes: 6 %
NEUTROS PCT: 54 %
Neutrophils Absolute: 2.1 10*3/uL (ref 1.4–7.0)
PLATELETS: 238 10*3/uL (ref 150–379)
RBC: 3.93 x10E6/uL (ref 3.77–5.28)
RDW: 14 % (ref 12.3–15.4)
WBC: 3.9 10*3/uL (ref 3.4–10.8)

## 2017-07-01 LAB — CMP14+EGFR
ALK PHOS: 60 IU/L (ref 39–117)
ALT: 11 IU/L (ref 0–32)
AST: 17 IU/L (ref 0–40)
Albumin/Globulin Ratio: 1.9 (ref 1.2–2.2)
Albumin: 4.4 g/dL (ref 3.5–5.5)
BUN / CREAT RATIO: 13 (ref 9–23)
BUN: 10 mg/dL (ref 6–24)
Bilirubin Total: <0.2 mg/dL (ref 0.0–1.2)
CO2: 29 mmol/L (ref 20–29)
CREATININE: 0.78 mg/dL (ref 0.57–1.00)
Calcium: 9.5 mg/dL (ref 8.7–10.2)
Chloride: 96 mmol/L (ref 96–106)
GFR, EST AFRICAN AMERICAN: 103 mL/min/{1.73_m2} (ref >59)
GFR, EST NON AFRICAN AMERICAN: 89 mL/min/{1.73_m2} (ref >59)
GLOBULIN, TOTAL: 2.3 g/dL (ref 1.5–4.5)
Glucose: 86 mg/dL (ref 65–99)
Potassium: 4.3 mmol/L (ref 3.5–5.2)
Sodium: 144 mmol/L (ref 134–144)
Total Protein: 6.7 g/dL (ref 6.0–8.5)

## 2017-07-17 ENCOUNTER — Telehealth: Payer: Self-pay | Admitting: Family

## 2017-07-17 ENCOUNTER — Ambulatory Visit (INDEPENDENT_AMBULATORY_CARE_PROVIDER_SITE_OTHER): Payer: 59 | Admitting: Psychiatry

## 2017-07-17 ENCOUNTER — Encounter (HOSPITAL_COMMUNITY): Payer: Self-pay | Admitting: Psychiatry

## 2017-07-17 DIAGNOSIS — F3131 Bipolar disorder, current episode depressed, mild: Secondary | ICD-10-CM

## 2017-07-17 DIAGNOSIS — F419 Anxiety disorder, unspecified: Secondary | ICD-10-CM | POA: Diagnosis not present

## 2017-07-17 DIAGNOSIS — Z87891 Personal history of nicotine dependence: Secondary | ICD-10-CM | POA: Diagnosis not present

## 2017-07-17 DIAGNOSIS — Z79899 Other long term (current) drug therapy: Secondary | ICD-10-CM | POA: Diagnosis not present

## 2017-07-17 DIAGNOSIS — Z818 Family history of other mental and behavioral disorders: Secondary | ICD-10-CM | POA: Diagnosis not present

## 2017-07-17 MED ORDER — ALPRAZOLAM 0.5 MG PO TABS: ORAL_TABLET | ORAL | 2 refills | Status: DC

## 2017-07-17 MED ORDER — LAMOTRIGINE 25 MG PO TABS: 75.0000 mg | ORAL_TABLET | Freq: Every day | ORAL | 2 refills | Status: DC

## 2017-07-17 NOTE — Telephone Encounter (Signed)
Faxed labs to Dr. Eber Hong

## 2017-07-17 NOTE — Progress Notes (Signed)
BH MD/PA/NP OP Progress Note  07/17/2017 12:21 PM Maureen [name]  MRN:  [id number]  Chief Complaint:  I stop taking Latuda because it was making me manic.  HPI: Patient came for her follow-up appointment.  On her last visit we tried Latuda to help her depression but she decided to have manic symptoms.  She believe Latuda and high-dose to write continuing to her manic symptoms.  Now she had cut down her prednisone and also stopped the Latuda and she is feeling better.  She does not want to change her medication because she had tried multiple psychotropic medication with limited response.  She still gets sometimes emotional and tearful but denies any hallucination, paranoia, mania or psychosis.  She is sleeping better since she cut down the steroids.  She is living with her 43 year old daughter who has bipolar disorder and her father.  She's not happy because the father of her daughter continues to drink and she is giving time at the end of this year and then she will decided to live somewhere else.  She is working as a Passenger transport manager at [employer]  She likes her job.  She is taking Lamictal and Xanax and she is also taking pain medication.  She's seen recently rheumatologist for her lupus and now she is taking azathioprine.  Patient denies drinking alcohol or using any illegal substances.  She denies any suicidal thoughts or homicidal thought.  She denies any feeling of hopelessness or worthlessness.  Her energy level is okay.  Her vitals signs are stable.  Visit Diagnosis:    ICD-10-CM   1. Bipolar affective disorder, currently depressed, mild (HCC) F31.31 ALPRAZolam (XANAX) 0.5 MG tablet    lamoTRIgine (LAMICTAL) 25 MG tablet    Past Psychiatric History: Reviewed. Patient has long history of drug addiction and diagnosed with depression and bipolar disorder in her early 50s. She has one psychiatric hospitalization at age 51 when she took overdose on pills and require hospitalization. She had another  brief ER visit few years later when she threatened to kill herself. She admitted history of severe mood swing, mania, impulsive behavior and depression. She had tried lithium, Prozac, Zyprexa, Abilify and increase dose of Lamictal with limited response. Recently we tried [place] but she developed manic symptoms .  Patient developed mania symptoms with antidepressant. She has seen psychiatrists at [facility] and then day [facility] [place] at [facility].  Past Medical History:  Past Medical History:  Diagnosis Date  . Abdominal pain   . Anxiety    takes Xanax daily as needed  . Arthritis   . Cancer ([place])    MOLE PRE  . Cerebrospinal fluid leak from spinal puncture 08/24/2012  . Chronic back pain   . Constipation    takes Dulcolax and Miralax daily as needed;also Fiber Pills  . Depression    takes Lamictal daily  . Fibromyalgia   . GERD (gastroesophageal reflux disease)    takes Omeprazole daily  . Headache(784.0)   . Heart murmur    slight  . History of bronchitis    last time many yrs ago(47yrs ago)  . Internal hemorrhoid   . Interstitial cystitis    no problems since beginning of Jan 2014  . Joint pain   . Joint swelling   . Lupus    takes Plaquenil daily  . Muscle spasm    takes Robaxin daily as needed  . Numbness and tingling in hands   . Osteoarthritis   .  Pneumonia    hx of > 86yrs ago  . Raynaud's disease   . Urinary frequency   . Urinary urgency     Past Surgical History:  Procedure Laterality Date  . ABDOMINAL HERNIA REPAIR    . APPENDECTOMY    . CESAREAN SECTION    . CHOLECYSTECTOMY    . COLONOSCOPY    . DIAGNOSTIC LAPAROSCOPY     adhesions  . ENDOMETRIAL ABLATION    . ESOPHAGOGASTRODUODENOSCOPY N/A 05/08/2013   Procedure: ESOPHAGOGASTRODUODENOSCOPY (EGD);  Surgeon: Rogene Houston, MD;  Location: [facility];  Service: Endoscopy;  Laterality: N/A;  315  . OVARIAN CYST REMOVAL    . SHOULDER SURGERY Right    x 2  . SPINAL  CORD STIMULATOR INSERTION N/A 06/13/2014   Procedure: LUMBAR SPINAL CORD STIMULATOR INSERTION;  Surgeon: Bonna Gains, MD;  Location: [facility];  Service: Neurosurgery;  Laterality: N/A;  . SPINAL FUSION     x 2   . TONSILLECTOMY    . TUBAL LIGATION    . uterine ablation    . VAGINAL HYSTERECTOMY  8/12    Family Psychiatric History: Reviewed.  Family History:  Family History  Problem Relation Age of Onset  . Hyperlipidemia Mother   . Depression Mother   . Anxiety disorder Mother   . Ovarian cancer Maternal Aunt   . Colon cancer Maternal Uncle   . Colon cancer Paternal Aunt   . Breast cancer Maternal Grandmother   . Alzheimer's disease Maternal Grandmother     Social History:  Social History   Social History  . Marital status: Legally Separated    Spouse name: N/A  . Number of children: N/A  . Years of education: N/A   Social History Main Topics  . Smoking status: Former Smoker    Packs/day: 0.50    Years: 20.00    Types: Cigarettes    Quit date: 06/09/2008  . Smokeless tobacco: Never Used  . Alcohol use No  . Drug use: No  . Sexual activity: No   Other Topics Concern  . Not on file   Social History Narrative  . No narrative on file    Allergies:  Allergies  Allergen Reactions  . Amoxicillin Rash  . Penicillins Rash    Has patient had a PCN reaction causing immediate rash, facial/tongue/throat swelling, SOB or lightheadedness with hypotension: yes Has patient had a PCN reaction causing severe rash involving mucus membranes or skin necrosis: yes pt did experience rash but it was not severe  Has patient had a PCN reaction that required hospitalization: no Has patient had a PCN reaction occurring within the last 10 years: no If all of the above answers are "NO", then may proceed with Cephalosporin use.   . Tegretol [Carbamazepine] Rash    Metabolic Disorder Labs: No results found for: HGBA1C, MPG No results found for: PROLACTIN No results found for:  CHOL, TRIG, HDL, CHOLHDL, VLDL, LDLCALC Lab Results  Component Value Date   TSH 1.000 02/18/2015    Therapeutic Level Labs: No results found for: LITHIUM No results found for: VALPROATE No components found for:  CBMZ  Current Medications: Current Outpatient Prescriptions  Medication Sig Dispense Refill  . ALPRAZolam (XANAX) 0.5 MG tablet Take 1 tab at bed time as 2nd as needed 35 tablet 1  . azaTHIOprine (IMURAN) 50 MG tablet Take 75 mg by mouth daily.    . carisoprodol (SOMA) 350 MG tablet Take 350 mg by mouth 2 (two) times  daily as needed for muscle spasms.   2  . clindamycin (CLEOCIN) 300 MG capsule Take 1 capsule (300 mg total) by mouth 3 (three) times daily. 21 capsule 0  . gabapentin (NEURONTIN) 300 MG capsule Take 1,200 mg by mouth at bedtime.     . hydrochlorothiazide (HYDRODIURIL) 25 MG tablet TAKE 1 TABLET (25 MG TOTAL) BY MOUTH DAILY. 90 tablet 1  . HYDROcodone-acetaminophen (NORCO) 7.5-325 MG tablet Take 1 tablet by mouth every 6 (six) hours as needed for moderate pain. 120 tablet 0  . ketorolac (TORADOL) 10 MG tablet Take 1 tablet (10 mg total) by mouth every 6 (six) hours as needed. For 4 days 16 tablet 0  . lamoTRIgine (LAMICTAL) 25 MG tablet Take 3 tablets (75 mg total) by mouth daily. 90 tablet 1  . ondansetron (ZOFRAN-ODT) 4 MG disintegrating tablet Take 1 tablet (4 mg total) by mouth every 8 (eight) hours as needed for nausea or vomiting. 20 tablet 1  . oxyCODONE-acetaminophen (PERCOCET/ROXICET) 5-325 MG tablet Take 1 tablet by mouth at bedtime as needed. Pain.  0  . polyethylene glycol (MIRALAX / GLYCOLAX) packet TAKE 17 G BY MOUTH DAILY. 30 packet 5  . predniSONE (DELTASONE) 5 MG tablet prednisone 10 mg daily for 4 days then 5 mg daily for now.     No current facility-administered medications for this visit.      Musculoskeletal: Strength & Muscle Tone: within normal limits Gait & Station: normal Patient leans: N/A  Psychiatric Specialty Exam: ROS  Blood  pressure 108/72, pulse 77, height 5\' 4"  (1.626 m), weight 150 lb (68 kg).Body mass index is 25.75 kg/m.  General Appearance: Casual and Gets emotional at times  Eye Contact:  Good  Speech:  Clear and Coherent  Volume:  Normal  Mood:  Anxious  Affect:  Appropriate  Thought Process:  Goal Directed  Orientation:  Full (Time, Place, and Person)  Thought Content: Rumination   Suicidal Thoughts:  No  Homicidal Thoughts:  No  Memory:  Immediate;   Good Recent;   Good Remote;   Good  Judgement:  Good  Insight:  Good  Psychomotor Activity:  Normal  Concentration:  Concentration: Fair and Attention Span: Fair  Recall:  Good  Fund of Knowledge: Good  Language: Good  Akathisia:  No  Handed:  Right  AIMS (if indicated): not done  Assets:  Communication Skills Desire for Improvement Housing Resilience  ADL's:  Intact  Cognition: WNL  Sleep:  Fair   Screenings: PHQ2-9     Office Visit from 06/30/2017 in [place] Visit from 05/19/2017 in [place] Visit from 10/06/2016 in [place] Visit from 09/05/2014 in [place]  PHQ-2 Total Score  0  0  2  0  PHQ-9 Total Score  -  -  10  -       Assessment and Plan: Bipolar disorder type I.  Anxiety disorder NOS.  I will discontinue Latuda since it is making her manic and she is no longer taking for past few weeks.  She does not want to change her medication.  Recommended to continue Xanax 0.5 mg at bedtime and Lamictal 75 mg daily.  Discussed medication side effects and benefits.  I recommended to see a therapist and she agreed to see a counselor in this office.  We will schedule appointment in this office.  Patient has no rash or itching with the Lamictal.  Recommended to call  us back if she has any question or any concern.  Follow-up in 3 months.   Shalice Woodring T., MD 07/17/2017, 12:21 PM

## 2017-07-18 ENCOUNTER — Other Ambulatory Visit (HOSPITAL_COMMUNITY): Payer: Self-pay | Admitting: Psychiatry

## 2017-07-18 DIAGNOSIS — F3131 Bipolar disorder, current episode depressed, mild: Secondary | ICD-10-CM

## 2017-07-21 DIAGNOSIS — J342 Deviated nasal septum: Secondary | ICD-10-CM | POA: Diagnosis not present

## 2017-07-21 DIAGNOSIS — G501 Atypical facial pain: Secondary | ICD-10-CM | POA: Diagnosis not present

## 2017-07-21 DIAGNOSIS — M950 Acquired deformity of nose: Secondary | ICD-10-CM | POA: Diagnosis not present

## 2017-07-26 DIAGNOSIS — I7300 Raynaud's syndrome without gangrene: Secondary | ICD-10-CM | POA: Diagnosis not present

## 2017-07-26 DIAGNOSIS — Z5181 Encounter for therapeutic drug level monitoring: Secondary | ICD-10-CM | POA: Diagnosis not present

## 2017-07-26 DIAGNOSIS — Z79899 Other long term (current) drug therapy: Secondary | ICD-10-CM | POA: Diagnosis not present

## 2017-07-26 DIAGNOSIS — M359 Systemic involvement of connective tissue, unspecified: Secondary | ICD-10-CM | POA: Diagnosis not present

## 2017-08-08 ENCOUNTER — Ambulatory Visit: Payer: 59 | Admitting: Family Medicine

## 2017-08-11 ENCOUNTER — Other Ambulatory Visit: Payer: Self-pay | Admitting: Family

## 2017-08-12 ENCOUNTER — Encounter: Payer: Self-pay | Admitting: Family Medicine

## 2017-08-12 ENCOUNTER — Ambulatory Visit (INDEPENDENT_AMBULATORY_CARE_PROVIDER_SITE_OTHER): Payer: 59 | Admitting: Family Medicine

## 2017-08-12 VITALS — BP 119/81 | HR 93 | Temp 97.7°F | Ht 63.5 in | Wt 148.0 lb

## 2017-08-12 DIAGNOSIS — J0100 Acute maxillary sinusitis, unspecified: Secondary | ICD-10-CM

## 2017-08-12 MED ORDER — DOXYCYCLINE HYCLATE 100 MG PO TABS: 100.0000 mg | ORAL_TABLET | Freq: Two times a day (BID) | ORAL | 0 refills | Status: DC

## 2017-08-12 NOTE — Patient Instructions (Signed)
Great to see you!   Sinusitis, Adult Sinusitis is soreness and inflammation of your sinuses. Sinuses are hollow spaces in the bones around your face. They are located:  Around your eyes.  In the middle of your forehead.  Behind your nose.  In your cheekbones.  Your sinuses and nasal passages are lined with a stringy fluid (mucus). Mucus normally drains out of your sinuses. When your nasal tissues get inflamed or swollen, the mucus can get trapped or blocked so air cannot flow through your sinuses. This lets bacteria, viruses, and funguses grow, and that leads to infection. Follow these instructions at home: Medicines  Take, use, or apply over-the-counter and prescription medicines only as told by your doctor. These may include nasal sprays.  If you were prescribed an antibiotic medicine, take it as told by your doctor. Do not stop taking the antibiotic even if you start to feel better. Hydrate and Humidify  Drink enough water to keep your pee (urine) clear or pale yellow.  Use a cool mist humidifier to keep the humidity level in your home above 50%.  Breathe in steam for 10-15 minutes, 3-4 times a day or as told by your doctor. You can do this in the bathroom while a hot shower is running.  Try not to spend time in cool or dry air. Rest  Rest as much as possible.  Sleep with your head raised (elevated).  Make sure to get enough sleep each night. General instructions  Put a warm, moist washcloth on your face 3-4 times a day or as told by your doctor. This will help with discomfort.  Wash your hands often with soap and water. If there is no soap and water, use hand sanitizer.  Do not smoke. Avoid being around people who are smoking (secondhand smoke).  Keep all follow-up visits as told by your doctor. This is important. Contact a doctor if:  You have a fever.  Your symptoms get worse.  Your symptoms do not get better within 10 days. Get help right away if:  You  have a very bad headache.  You cannot stop throwing up (vomiting).  You have pain or swelling around your face or eyes.  You have trouble seeing.  You feel confused.  Your neck is stiff.  You have trouble breathing. This information is not intended to replace advice given to you by your health care provider. Make sure you discuss any questions you have with your health care provider. Document Released: 03/28/2008 Document Revised: 06/05/2016 Document Reviewed: 08/05/2015 Elsevier Interactive Patient Education  2018 [employer]vier Inc.  

## 2017-08-12 NOTE — Progress Notes (Signed)
   HPI  Patient presents today here with acute illness.  Patient explains she has had 4 days of sneezing, cough, congestion, and facial pain and pressure. She also complains of chest tightness and mild shortness of breath.  She denies fever, chills, sweats.  She has history of lupus and is on immune suppressant, Imuran, and prednisone.  She states that she was on vacation this week and was ill the entire time.  PMH: Smoking status noted ROS: Per HPI  Objective: BP 119/81 (BP Location: Right Arm)   Pulse 93   Temp 97.7 F (36.5 C) (Oral)   Ht 5' 3.5" (1.613 m)   Wt 148 lb (67.1 kg)   BMI 25.81 kg/m  Gen: NAD, alert, cooperative with exam HEENT: NCAT, varix moist and clear, TMs normal bilaterally, tenderness to palpation of the left frontal sinus and bilateral maxillary sinuses CV: RRR, good S1/S2, no murmur Resp: CTABL, no wheezes, non-labored Ext: No edema, warm Neuro: Alert and oriented, No gross deficits  Assessment and plan:  #Acute maxillary sinusitis Treat with doxycycline, penicillin allergy Supportive care Return to clinic as needed  She was previously on a course of clindamycin for preseptal cellulitis, I recommended using probiotics and discussed the dangers of using antibiotics that are so broad-spectrum close together.   Meds ordered this encounter  Medications  . doxycycline (VIBRA-TABS) 100 MG tablet    Sig: Take 1 tablet (100 mg total) by mouth 2 (two) times daily. 1 po bid    Dispense:  20 tablet    Refill:  0    Laroy Apple, MD [facility] 08/12/2017, 11:56 AM

## 2017-08-15 ENCOUNTER — Ambulatory Visit (HOSPITAL_COMMUNITY): Payer: Self-pay | Admitting: Licensed Clinical Social Worker

## 2017-08-17 ENCOUNTER — Telehealth: Payer: Self-pay | Admitting: *Deleted

## 2017-08-17 DIAGNOSIS — J342 Deviated nasal septum: Secondary | ICD-10-CM | POA: Diagnosis not present

## 2017-08-17 DIAGNOSIS — J3489 Other specified disorders of nose and nasal sinuses: Secondary | ICD-10-CM | POA: Diagnosis not present

## 2017-08-17 DIAGNOSIS — M950 Acquired deformity of nose: Secondary | ICD-10-CM | POA: Diagnosis not present

## 2017-08-17 NOTE — Telephone Encounter (Signed)
Pt notified of recommendation appt scheduled

## 2017-08-17 NOTE — Telephone Encounter (Signed)
ptient without good improvement on dxy, stated on day 9 but this was prescribed 5 days ago.   .if no improvement of more resp problems will likely need to be seen again. However it is reasonable to finish course as this is only 5 days of Tx. With recent clinda course we should be very careful with antibiotics.   I could also consider sending albuterol for wheezing. Could also bump up prednisone for a short time ( like 5 -7 days) but she is slowly titrating this down so I am hesitatnt to do this as well.   Laroy Apple, MD [facility] 08/17/2017, 11:41 AM

## 2017-08-17 NOTE — Telephone Encounter (Signed)
Patient is on day 9 of doxycycline, she is not any better, wheezing is worse and cough also.

## 2017-08-18 ENCOUNTER — Ambulatory Visit: Payer: 59 | Admitting: Family

## 2017-09-20 DIAGNOSIS — M542 Cervicalgia: Secondary | ICD-10-CM | POA: Diagnosis not present

## 2017-09-20 DIAGNOSIS — Z9689 Presence of other specified functional implants: Secondary | ICD-10-CM | POA: Diagnosis not present

## 2017-09-20 DIAGNOSIS — M545 Low back pain: Secondary | ICD-10-CM | POA: Diagnosis not present

## 2017-10-09 ENCOUNTER — Encounter (HOSPITAL_COMMUNITY): Payer: Self-pay | Admitting: Psychiatry

## 2017-10-09 ENCOUNTER — Ambulatory Visit (HOSPITAL_COMMUNITY): Payer: 59 | Admitting: Psychiatry

## 2017-10-09 DIAGNOSIS — Z87891 Personal history of nicotine dependence: Secondary | ICD-10-CM | POA: Diagnosis not present

## 2017-10-09 DIAGNOSIS — Z915 Personal history of self-harm: Secondary | ICD-10-CM | POA: Diagnosis not present

## 2017-10-09 DIAGNOSIS — F3131 Bipolar disorder, current episode depressed, mild: Secondary | ICD-10-CM | POA: Diagnosis not present

## 2017-10-09 DIAGNOSIS — Z818 Family history of other mental and behavioral disorders: Secondary | ICD-10-CM | POA: Diagnosis not present

## 2017-10-09 DIAGNOSIS — F419 Anxiety disorder, unspecified: Secondary | ICD-10-CM

## 2017-10-09 DIAGNOSIS — Z79899 Other long term (current) drug therapy: Secondary | ICD-10-CM | POA: Diagnosis not present

## 2017-10-09 MED ORDER — LAMOTRIGINE 25 MG PO TABS: 75.0000 mg | ORAL_TABLET | Freq: Every day | ORAL | 2 refills | Status: DC

## 2017-10-09 MED ORDER — ALPRAZOLAM 0.5 MG PO TABS: ORAL_TABLET | ORAL | 2 refills | Status: DC

## 2017-10-09 MED ORDER — FLUOXETINE HCL 20 MG PO CAPS: 20.0000 mg | ORAL_CAPSULE | Freq: Every day | ORAL | 2 refills | Status: DC

## 2017-10-09 NOTE — Progress Notes (Signed)
BH MD/PA/NP OP Progress Note  10/09/2017 12:20 PM Maureen [name]  MRN:  [id number]  Chief Complaint: I am taking Prozac and it is helping my depression.  I am no longer taking prednisone.  HPI: Maureen [name] came for her follow-up appointment.  She admitted taking Prozac from her mother.  She admitted that she tried Prozac to see if it works and now she feels it is helping her depression.  She realized that she should not have taken someone else medication and she apologized that she will not do in the future.  Overall she describes her mood is better.  She is not emotional and tearful.  She is no longer taking prednisone.  She is scheduled to see her rheumatologist in few weeks to discuss what other options she has to take for lupus.  She is hoping to move out in March.  She admitted her husband continues to drink and she realized that she need to move out in the future.  Patient denies any hallucination, paranoia, suicidal thoughts or homicidal thought.  Since taking the Prozac she denies any feeling of hopelessness or worthlessness.  Patient denies drinking alcohol or using any illegal substances.  Her job is going very well.  She stopped Latuda and since then she has no longer manic symptoms.  Her appetite is okay.  Her vital signs are stable.  Visit Diagnosis:    ICD-10-CM   1. Bipolar affective disorder, currently depressed, mild (HCC) F31.31 lamoTRIgine (LAMICTAL) 25 MG tablet    ALPRAZolam (XANAX) 0.5 MG tablet    Past Psychiatric History: Reviewed. Patient has long history of drug addiction and diagnosed with depression and bipolar disorder in her early 29s. She has one psychiatric hospitalization at age 56 when she took overdose on pills and require hospitalization. She had another brief ER visit few years later when she threatened to kill herself. She admitted history of severe mood swing, mania, impulsive behavior and depression. She had tried lithium, Prozac, Zyprexa, Abilify and increase  dose of Lamictal with limited response. Recently we tried [place] but she developed manic symptoms .  Patient developed mania symptoms with antidepressant. She has seen psychiatrists at [place] and then day [place] [place] at [facility].  Past Medical History:  Past Medical History:  Diagnosis Date  . Abdominal pain   . Anxiety    takes Xanax daily as needed  . Arthritis   . Cancer ([place])    MOLE PRE  . Cerebrospinal fluid leak from spinal puncture 08/24/2012  . Chronic back pain   . Constipation    takes Dulcolax and Miralax daily as needed;also Fiber Pills  . Depression    takes Lamictal daily  . Fibromyalgia   . GERD (gastroesophageal reflux disease)    takes Omeprazole daily  . Headache(784.0)   . Heart murmur    slight  . History of bronchitis    last time many yrs ago(35yrs ago)  . Internal hemorrhoid   . Interstitial cystitis    no problems since beginning of Jan 2014  . Joint pain   . Joint swelling   . Lupus    takes Plaquenil daily  . Muscle spasm    takes Robaxin daily as needed  . Numbness and tingling in hands   . Osteoarthritis   . Pneumonia    hx of > 17yrs ago  . Raynaud's disease   . Urinary frequency   . Urinary urgency     Past Surgical History:  Procedure Laterality Date  . ABDOMINAL HERNIA REPAIR    . APPENDECTOMY    . CESAREAN SECTION    . CHOLECYSTECTOMY    . COLONOSCOPY    . DIAGNOSTIC LAPAROSCOPY     adhesions  . ENDOMETRIAL ABLATION    . ESOPHAGOGASTRODUODENOSCOPY N/A 05/08/2013   Procedure: ESOPHAGOGASTRODUODENOSCOPY (EGD);  Surgeon: Rogene Houston, MD;  Location: [facility];  Service: Endoscopy;  Laterality: N/A;  315  . OVARIAN CYST REMOVAL    . SHOULDER SURGERY Right    x 2  . SPINAL CORD STIMULATOR INSERTION N/A 06/13/2014   Procedure: LUMBAR SPINAL CORD STIMULATOR INSERTION;  Surgeon: Bonna Gains, MD;  Location: [facility];  Service: Neurosurgery;  Laterality: N/A;  . SPINAL FUSION     x 2    . TONSILLECTOMY    . TUBAL LIGATION    . uterine ablation    . VAGINAL HYSTERECTOMY  8/12    Family Psychiatric History: Reviewed.  Family History:  Family History  Problem Relation Age of Onset  . Hyperlipidemia Mother   . Depression Mother   . Anxiety disorder Mother   . Ovarian cancer Maternal Aunt   . Colon cancer Maternal Uncle   . Colon cancer Paternal Aunt   . Breast cancer Maternal Grandmother   . Alzheimer's disease Maternal Grandmother     Social History:  Social History   Socioeconomic History  . Marital status: Legally Separated    Spouse name: None  . Number of children: None  . Years of education: None  . Highest education level: None  Social Needs  . Financial resource strain: None  . Food insecurity - worry: None  . Food insecurity - inability: None  . Transportation needs - medical: None  . Transportation needs - non-medical: None  Occupational History  . None  Tobacco Use  . Smoking status: Former Smoker    Packs/day: 0.50    Years: 20.00    Pack years: 10.00    Types: Cigarettes    Last attempt to quit: 06/09/2008    Years since quitting: 9.3  . Smokeless tobacco: Never Used  Substance and Sexual Activity  . Alcohol use: No    Alcohol/week: 0.0 oz  . Drug use: No  . Sexual activity: Yes    Partners: Male    Birth control/protection: Surgical  Other Topics Concern  . None  Social History Narrative  . None    Allergies:  Allergies  Allergen Reactions  . Amoxicillin Rash  . Penicillins Rash    Has patient had a PCN reaction causing immediate rash, facial/tongue/throat swelling, SOB or lightheadedness with hypotension: yes Has patient had a PCN reaction causing severe rash involving mucus membranes or skin necrosis: yes pt did experience rash but it was not severe  Has patient had a PCN reaction that required hospitalization: no Has patient had a PCN reaction occurring within the last 10 years: no If all of the above answers are  "NO", then may proceed with Cephalosporin use.   . Tegretol [Carbamazepine] Rash    Metabolic Disorder Labs: No results found for: HGBA1C, MPG No results found for: PROLACTIN No results found for: CHOL, TRIG, HDL, CHOLHDL, VLDL, LDLCALC Lab Results  Component Value Date   TSH 1.000 02/18/2015    Therapeutic Level Labs: No results found for: LITHIUM No results found for: VALPROATE No components found for:  CBMZ  Current Medications: Current Outpatient Medications  Medication Sig Dispense Refill  . ALPRAZolam (XANAX) 0.5  MG tablet Take 1 tab at bed time as 2nd as needed 35 tablet 2  . carisoprodol (SOMA) 350 MG tablet Take 350 mg by mouth 2 (two) times daily as needed for muscle spasms.   2  . FLUoxetine (PROZAC) 10 MG capsule Take 10 mg by mouth daily.    Marland Kitchen gabapentin (NEURONTIN) 300 MG capsule Take 1,200 mg by mouth at bedtime.     . hydrochlorothiazide (HYDRODIURIL) 25 MG tablet TAKE 1 TABLET (25 MG TOTAL) BY MOUTH DAILY. 90 tablet 0  . HYDROcodone-acetaminophen (NORCO) 7.5-325 MG tablet Take 1 tablet by mouth every 6 (six) hours as needed for moderate pain. 120 tablet 0  . lamoTRIgine (LAMICTAL) 25 MG tablet Take 3 tablets (75 mg total) by mouth daily. 90 tablet 2  . ondansetron (ZOFRAN-ODT) 4 MG disintegrating tablet Take 1 tablet (4 mg total) by mouth every 8 (eight) hours as needed for nausea or vomiting. 20 tablet 1  . oxyCODONE-acetaminophen (PERCOCET/ROXICET) 5-325 MG tablet Take 1 tablet by mouth at bedtime as needed. Pain.  0  . polyethylene glycol (MIRALAX / GLYCOLAX) packet TAKE 17 G BY MOUTH DAILY. 30 packet 5  . azaTHIOprine (IMURAN) 50 MG tablet Take 150 mg by mouth daily.     Marland Kitchen doxycycline (VIBRA-TABS) 100 MG tablet Take 1 tablet (100 mg total) by mouth 2 (two) times daily. 1 po bid (Patient not taking: Reported on 10/09/2017) 20 tablet 0  . predniSONE (DELTASONE) 1 MG tablet Take 3 mg by mouth daily with breakfast.     No current facility-administered medications  for this visit.      Musculoskeletal: Strength & Muscle Tone: within normal limits Gait & Station: normal Patient leans: N/A  Psychiatric Specialty Exam: ROS  Blood pressure 116/76, pulse 74, height 5\' 4"  (1.626 m), weight 148 lb (67.1 kg), SpO2 96 %.Body mass index is 25.4 kg/m.  General Appearance: Casual  Eye Contact:  Good  Speech:  Clear and Coherent  Volume:  Normal  Mood:  Euthymic  Affect:  Appropriate  Thought Process:  Goal Directed  Orientation:  Full (Time, Place, and Person)  Thought Content: Logical   Suicidal Thoughts:  No  Homicidal Thoughts:  No  Memory:  Immediate;   Good Recent;   Good Remote;   Good  Judgement:  Good  Insight:  Good  Psychomotor Activity:  Normal  Concentration:  Concentration: Good and Attention Span: Good  Recall:  Good  Fund of Knowledge: Good  Language: Good  Akathisia:  No  Handed:  Right  AIMS (if indicated): not done  Assets:  Communication Skills Desire for Improvement Housing Resilience  ADL's:  Intact  Cognition: WNL  Sleep:  Good   Screenings: PHQ2-9     Office Visit from 06/30/2017 in [place] Visit from 05/19/2017 in [place] Office Visit from 10/06/2016 in [place] Visit from 09/05/2014 in [place]  PHQ-2 Total Score  0  0  2  0  PHQ-9 Total Score  No data  No data  10  No data       Assessment and Plan: Bipolar disorder type I.  Anxiety disorder NOS.  Patient is taking her mother's Prozac.  Discussed about medication policy and should not use someone else medication.  Patient apologized and promised that she will not use someone's medication in the future.  However she feels the Prozac working very well for her.  I will continue Prozac 20  mg daily, Lamictal 75 mg daily and Xanax 0.5 mg as needed for insomnia and anxiety.  Patient is not interested in counseling because she is busy and does not  have time for therapy.  I recommended to call us back if she has any question, concern or if she feels worsening of the symptoms.  Follow-up in 3 months.   [name], MD 10/09/2017, 12:20 PM

## 2017-10-11 DIAGNOSIS — M329 Systemic lupus erythematosus, unspecified: Secondary | ICD-10-CM | POA: Diagnosis not present

## 2017-10-11 DIAGNOSIS — Z79899 Other long term (current) drug therapy: Secondary | ICD-10-CM | POA: Diagnosis not present

## 2017-10-11 DIAGNOSIS — Z92241 Personal history of systemic steroid therapy: Secondary | ICD-10-CM | POA: Diagnosis not present

## 2017-10-11 DIAGNOSIS — I7300 Raynaud's syndrome without gangrene: Secondary | ICD-10-CM | POA: Diagnosis not present

## 2017-10-13 DIAGNOSIS — Z79899 Other long term (current) drug therapy: Secondary | ICD-10-CM | POA: Diagnosis not present

## 2017-11-21 ENCOUNTER — Encounter: Payer: Self-pay | Admitting: Family Medicine

## 2017-11-21 ENCOUNTER — Ambulatory Visit: Payer: 59 | Admitting: Family Medicine

## 2017-11-21 VITALS — BP 114/77 | HR 70 | Temp 97.4°F | Ht 64.0 in | Wt 150.0 lb

## 2017-11-21 DIAGNOSIS — S60032A Contusion of left middle finger without damage to nail, initial encounter: Secondary | ICD-10-CM

## 2017-11-21 MED ORDER — DICLOFENAC SODIUM 75 MG PO TBEC: 75.0000 mg | DELAYED_RELEASE_TABLET | Freq: Two times a day (BID) | ORAL | 0 refills | Status: DC | PRN

## 2017-11-21 NOTE — Progress Notes (Signed)
Subjective: CC: finger swelling PCP: Sharion Balloon, FNP XBD:ZHGDJM R Heumann is a 52 y.o. female presenting to clinic today for:  1. Finger swelling Patient reports that she was at work earlier today when she noticed that her middle finger became painful and swollen.  She denies preceding injury.  She does report some bruising and discoloration of the middle finger.  She is able to flex and extend finger without difficulty.  She notes that she has a past medical history of raynauds syndrome and SLE.  She was worried that this may be a DVT, so she came to the office for evaluation.  No medications used for issue.   ROS: Per HPI  Allergies  Allergen Reactions  . Amoxicillin Rash  . Penicillins Rash    Has patient had a PCN reaction causing immediate rash, facial/tongue/throat swelling, SOB or lightheadedness with hypotension: yes Has patient had a PCN reaction causing severe rash involving mucus membranes or skin necrosis: yes pt did experience rash but it was not severe  Has patient had a PCN reaction that required hospitalization: no Has patient had a PCN reaction occurring within the last 10 years: no If all of the above answers are "NO", then may proceed with Cephalosporin use.   . Tegretol [Carbamazepine] Rash   Past Medical History:  Diagnosis Date  . Abdominal pain   . Anxiety    takes Xanax daily as needed  . Arthritis   . Cancer ([place])    MOLE PRE  . Cerebrospinal fluid leak from spinal puncture 08/24/2012  . Chronic back pain   . Constipation    takes Dulcolax and Miralax daily as needed;also Fiber Pills  . Depression    takes Lamictal daily  . Fibromyalgia   . GERD (gastroesophageal reflux disease)    takes Omeprazole daily  . Headache(784.0)   . Heart murmur    slight  . History of bronchitis    last time many yrs ago(13yrs ago)  . Internal hemorrhoid   . Interstitial cystitis    no problems since beginning of Jan 2014  . Joint pain   . Joint swelling   .  Lupus    takes Plaquenil daily  . Muscle spasm    takes Robaxin daily as needed  . Numbness and tingling in hands   . Osteoarthritis   . Pneumonia    hx of > 34yrs ago  . Raynaud's disease   . Urinary frequency   . Urinary urgency     Current Outpatient Medications:  .  ALPRAZolam (XANAX) 0.5 MG tablet, Take 1 tab at bed time as 2nd as needed, Disp: 35 tablet, Rfl: 2 .  azaTHIOprine (IMURAN) 50 MG tablet, Take 150 mg by mouth daily. , Disp: , Rfl:  .  carisoprodol (SOMA) 350 MG tablet, Take 350 mg by mouth 2 (two) times daily as needed for muscle spasms. , Disp: , Rfl: 2 .  doxycycline (VIBRA-TABS) 100 MG tablet, Take 1 tablet (100 mg total) by mouth 2 (two) times daily. 1 po bid (Patient not taking: Reported on 10/09/2017), Disp: 20 tablet, Rfl: 0 .  FLUoxetine (PROZAC) 20 MG capsule, Take 1 capsule (20 mg total) by mouth daily., Disp: 30 capsule, Rfl: 2 .  gabapentin (NEURONTIN) 300 MG capsule, Take 1,200 mg by mouth at bedtime. , Disp: , Rfl:  .  hydrochlorothiazide (HYDRODIURIL) 25 MG tablet, TAKE 1 TABLET (25 MG TOTAL) BY MOUTH DAILY., Disp: 90 tablet, Rfl: 0 .  HYDROcodone-acetaminophen (NORCO) 7.5-325  MG tablet, Take 1 tablet by mouth every 6 (six) hours as needed for moderate pain., Disp: 120 tablet, Rfl: 0 .  lamoTRIgine (LAMICTAL) 25 MG tablet, Take 3 tablets (75 mg total) by mouth daily., Disp: 90 tablet, Rfl: 2 .  ondansetron (ZOFRAN-ODT) 4 MG disintegrating tablet, Take 1 tablet (4 mg total) by mouth every 8 (eight) hours as needed for nausea or vomiting., Disp: 20 tablet, Rfl: 1 .  oxyCODONE-acetaminophen (PERCOCET/ROXICET) 5-325 MG tablet, Take 1 tablet by mouth at bedtime as needed. Pain., Disp: , Rfl: 0 .  polyethylene glycol (MIRALAX / GLYCOLAX) packet, TAKE 17 G BY MOUTH DAILY., Disp: 30 packet, Rfl: 5 .  predniSONE (DELTASONE) 1 MG tablet, Take 3 mg by mouth daily with breakfast., Disp: , Rfl:  Social History   Socioeconomic History  . Marital status: Legally  Separated    Spouse name: Not on file  . Number of children: Not on file  . Years of education: Not on file  . Highest education level: Not on file  Social Needs  . Financial resource strain: Not on file  . Food insecurity - worry: Not on file  . Food insecurity - inability: Not on file  . Transportation needs - medical: Not on file  . Transportation needs - non-medical: Not on file  Occupational History  . Not on file  Tobacco Use  . Smoking status: Former Smoker    Packs/day: 0.50    Years: 20.00    Pack years: 10.00    Types: Cigarettes    Last attempt to quit: 06/09/2008    Years since quitting: 9.4  . Smokeless tobacco: Never Used  Substance and Sexual Activity  . Alcohol use: No    Alcohol/week: 0.0 oz  . Drug use: No  . Sexual activity: Yes    Partners: Male    Birth control/protection: Surgical  Other Topics Concern  . Not on file  Social History Narrative  . Not on file   Family History  Problem Relation Age of Onset  . Hyperlipidemia Mother   . Depression Mother   . Anxiety disorder Mother   . Ovarian cancer Maternal Aunt   . Colon cancer Maternal Uncle   . Colon cancer Paternal Aunt   . Breast cancer Maternal Grandmother   . Alzheimer's disease Maternal Grandmother     Objective: Office vital signs reviewed. BP 114/77   Pulse 70   Temp (!) 97.4 F (36.3 C) (Oral)   Ht 5\' 4"  (1.626 m)   Wt 150 lb (68 kg)   BMI 25.75 kg/m   Physical Examination:  General: Awake, alert, well nourished, well appearing, No acute distress MSK: Patient has full active range of motion of bilateral hands and fingers.  There is mild soft tissue swelling along the middle of the third digit on the left.  There is associated ecchymosis.  There is tenderness to palpation over this area.  No increased warmth. Skin: As above.  No skin breakdown.  Assessment/ Plan: 52 y.o. female   1. Contusion of left middle finger without damage to nail, initial encounter Appears to be a  superficial phlebitis versus contusion of the left middle finger.  I prescribed oral NSAIDs for her to use twice a day for the next 5 days.  Apply ice as needed.  Follow-up if no improvements or worsening symptoms.  Patient voiced good understanding of return precautions.   Meds ordered this encounter  Medications  . diclofenac (VOLTAREN) 75 MG EC tablet  Sig: Take 1 tablet (75 mg total) by mouth 2 (two) times daily as needed.    Dispense:  10 tablet    Refill:  0     Niley Helbig Windell Moulding, DO [place] [phone]

## 2017-11-21 NOTE — Patient Instructions (Signed)
It appears that you have broken a superficial blood vessel in the middle finger.  I suspect that the discoloration is bruising beneath the skin.  I prescribed an oral NSAID free to take twice a day for the next 5 days.  As we discussed apply ice and avoid aggravating activities.   Contusion A contusion is a deep bruise. Contusions happen when an injury causes bleeding under the skin. Symptoms of bruising include pain, swelling, and discolored skin. The skin may turn blue, purple, or yellow. Follow these instructions at home:  Rest the injured area.  If told, put ice on the injured area. ? Put ice in a plastic bag. ? Place a towel between your skin and the bag. ? Leave the ice on for 20 minutes, 2-3 times per day.  If told, put light pressure (compression) on the injured area using an elastic bandage. Make sure the bandage is not too tight. Remove it and put it back on as told by your doctor.  If possible, raise (elevate) the injured area above the level of your heart while you are sitting or lying down.  Take over-the-counter and prescription medicines only as told by your doctor. Contact a doctor if:  Your symptoms do not get better after several days of treatment.  Your symptoms get worse.  You have trouble moving the injured area. Get help right away if:  You have very bad pain.  You have a loss of feeling (numbness) in a hand or foot.  Your hand or foot turns pale or cold. This information is not intended to replace advice given to you by your health care provider. Make sure you discuss any questions you have with your health care provider. Document Released: 03/28/2008 Document Revised: 03/17/2016 Document Reviewed: 02/25/2015 Elsevier Interactive Patient Education  2018 [employer].

## 2017-11-27 DIAGNOSIS — M950 Acquired deformity of nose: Secondary | ICD-10-CM | POA: Diagnosis not present

## 2017-11-27 DIAGNOSIS — J343 Hypertrophy of nasal turbinates: Secondary | ICD-10-CM | POA: Diagnosis not present

## 2017-11-27 DIAGNOSIS — J342 Deviated nasal septum: Secondary | ICD-10-CM | POA: Diagnosis not present

## 2017-12-05 ENCOUNTER — Encounter: Payer: Self-pay | Admitting: Nurse Practitioner

## 2017-12-05 ENCOUNTER — Ambulatory Visit: Payer: 59 | Admitting: Nurse Practitioner

## 2017-12-05 VITALS — BP 110/79 | HR 109 | Temp 102.2°F | Ht 64.0 in | Wt 149.0 lb

## 2017-12-05 DIAGNOSIS — R6889 Other general symptoms and signs: Secondary | ICD-10-CM

## 2017-12-05 DIAGNOSIS — R300 Dysuria: Secondary | ICD-10-CM

## 2017-12-05 DIAGNOSIS — R509 Fever, unspecified: Secondary | ICD-10-CM

## 2017-12-05 LAB — MICROSCOPIC EXAMINATION
Epithelial Cells (non renal): NONE SEEN /hpf (ref 0–10)
RENAL EPITHEL UA: NONE SEEN /HPF

## 2017-12-05 LAB — URINALYSIS, COMPLETE
Bilirubin, UA: NEGATIVE
Glucose, UA: NEGATIVE
Leukocytes, UA: NEGATIVE
Nitrite, UA: NEGATIVE
Protein, UA: NEGATIVE
Specific Gravity, UA: 1.015 (ref 1.005–1.030)
Urobilinogen, Ur: 0.2 mg/dL (ref 0.2–1.0)
pH, UA: 7 (ref 5.0–7.5)

## 2017-12-05 LAB — VERITOR FLU A/B WAIVED
INFLUENZA B: NEGATIVE
Influenza A: NEGATIVE

## 2017-12-05 MED ORDER — OSELTAMIVIR PHOSPHATE 75 MG PO CAPS: 75.0000 mg | ORAL_CAPSULE | Freq: Two times a day (BID) | ORAL | 0 refills | Status: DC

## 2017-12-05 NOTE — Progress Notes (Signed)
Subjective:    Patient ID: Maureen [name], female    DOB: 1966-01-23, 52 y.o.   MRN: [id number]  HPI Patient comes in today c/o  Feels like she has been ran over by a train. Achy all over. Fever 102.2, cough and congestion. Also wants urine checked because it is dark.    Review of Systems  Constitutional: Positive for appetite change, chills and fever.  HENT: Positive for congestion, rhinorrhea and sore throat. Negative for ear pain and trouble swallowing.   Respiratory: Positive for cough.   Musculoskeletal: Positive for myalgias.  Neurological: Positive for headaches.  Psychiatric/Behavioral: Negative.   All other systems reviewed and are negative.      Objective:   Physical Exam  Constitutional: She is oriented to person, place, and time. She appears well-developed and well-nourished.  HENT:  Right Ear: Hearing, tympanic membrane, external ear and ear [name] normal.  Left Ear: Hearing, tympanic membrane, external ear and ear [name] normal.  Nose: Mucosal edema and rhinorrhea present. Right sinus exhibits no maxillary sinus tenderness and no frontal sinus tenderness. Left sinus exhibits no maxillary sinus tenderness and no frontal sinus tenderness.  Mouth/Throat: Uvula is midline, oropharynx is clear and moist and mucous membranes are normal.  Eyes: EOM are normal.  Neck: Trachea normal, normal range of motion and full passive range of motion without pain. Neck supple. No JVD present. Carotid bruit is not present. No thyromegaly present.  Cardiovascular: Normal rate, regular rhythm, normal heart sounds and intact distal pulses. Exam reveals no gallop and no friction rub.  No murmur heard. Pulmonary/Chest: Effort normal and breath sounds normal.  Abdominal: Soft. Bowel sounds are normal. She exhibits no distension and no mass. There is no tenderness.  Musculoskeletal: Normal range of motion.  Lymphadenopathy:    She has no cervical adenopathy.  Neurological: She is alert and  oriented to person, place, and time. She has normal reflexes.  Skin: Skin is warm and dry.  Face flushed Skin hot to touch  Psychiatric: She has a normal mood and affect. Her behavior is normal. Judgment and thought content normal.   BP 110/79   Pulse (!) 109   Temp (!) 102.2 F (39 C) (Oral)   Ht 5\' 4"  (1.626 m)   Wt 149 lb (67.6 kg)   BMI 25.58 kg/m   Flu negative   UA ok  Assessment & Plan:   1. Dysuria   2. Fever, unspecified fever cause   3. Flu-like symptoms   Medications  . oseltamivir (TAMIFLU) 75 MG capsule    Sig: Take 1 capsule (75 mg total) by mouth 2 (two) times daily.    Dispense:  10 capsule    Refill:  0    Order Specific Question:   Supervising Provider    Answer:   VINCENT, CAROL L [[id number]]   1. Take meds as prescribed 2. Use a cool mist humidifier especially during the winter months and when heat has been humid. 3. Use saline nose sprays frequently 4. Saline irrigations of the nose can be very helpful if done frequently.  * 4X daily for 1 week*  * Use of a nettie pot can be helpful with this. Follow directions with this* 5. Drink plenty of fluids 6. Keep thermostat turn down low 7.For any cough or congestion  Use plain Mucinex- regular strength or max strength is fine   * Children- consult with Pharmacist for dosing 8. For fever or aces or pains- take tylenol or ibuprofen appropriate  for age and weight.  * for fevers greater than 101 orally you may alternate ibuprofen and tylenol every  3 hours.   Mary-Margaret Hassell Done, FNP

## 2017-12-05 NOTE — Patient Instructions (Signed)

## 2017-12-07 ENCOUNTER — Telehealth: Payer: Self-pay | Admitting: Nurse Practitioner

## 2017-12-07 NOTE — Telephone Encounter (Signed)
Note up front, pt aware

## 2017-12-11 DIAGNOSIS — Z79899 Other long term (current) drug therapy: Secondary | ICD-10-CM | POA: Diagnosis not present

## 2017-12-11 DIAGNOSIS — Z5181 Encounter for therapeutic drug level monitoring: Secondary | ICD-10-CM | POA: Diagnosis not present

## 2017-12-11 DIAGNOSIS — M329 Systemic lupus erythematosus, unspecified: Secondary | ICD-10-CM | POA: Diagnosis not present

## 2017-12-15 DIAGNOSIS — J3489 Other specified disorders of nose and nasal sinuses: Secondary | ICD-10-CM | POA: Diagnosis not present

## 2017-12-15 DIAGNOSIS — J343 Hypertrophy of nasal turbinates: Secondary | ICD-10-CM | POA: Diagnosis not present

## 2017-12-15 DIAGNOSIS — M950 Acquired deformity of nose: Secondary | ICD-10-CM | POA: Diagnosis not present

## 2017-12-15 DIAGNOSIS — J342 Deviated nasal septum: Secondary | ICD-10-CM | POA: Diagnosis not present

## 2017-12-19 DIAGNOSIS — Z9689 Presence of other specified functional implants: Secondary | ICD-10-CM | POA: Diagnosis not present

## 2017-12-19 DIAGNOSIS — Z5181 Encounter for therapeutic drug level monitoring: Secondary | ICD-10-CM | POA: Diagnosis not present

## 2017-12-19 DIAGNOSIS — Z79891 Long term (current) use of opiate analgesic: Secondary | ICD-10-CM | POA: Diagnosis not present

## 2017-12-19 DIAGNOSIS — Z79899 Other long term (current) drug therapy: Secondary | ICD-10-CM | POA: Diagnosis not present

## 2017-12-19 DIAGNOSIS — M545 Low back pain: Secondary | ICD-10-CM | POA: Diagnosis not present

## 2017-12-19 DIAGNOSIS — M542 Cervicalgia: Secondary | ICD-10-CM | POA: Diagnosis not present

## 2018-01-08 ENCOUNTER — Encounter (HOSPITAL_COMMUNITY): Payer: Self-pay | Admitting: Psychiatry

## 2018-01-08 ENCOUNTER — Ambulatory Visit (INDEPENDENT_AMBULATORY_CARE_PROVIDER_SITE_OTHER): Payer: 59 | Admitting: Pediatrics

## 2018-01-08 ENCOUNTER — Encounter: Payer: Self-pay | Admitting: Pediatrics

## 2018-01-08 ENCOUNTER — Ambulatory Visit (INDEPENDENT_AMBULATORY_CARE_PROVIDER_SITE_OTHER): Payer: 59 | Admitting: Psychiatry

## 2018-01-08 VITALS — BP 122/74 | HR 67 | Ht 64.0 in | Wt 151.2 lb

## 2018-01-08 VITALS — BP 104/65 | HR 81 | Temp 97.5°F | Ht 64.0 in | Wt 150.2 lb

## 2018-01-08 DIAGNOSIS — Z810 Family history of intellectual disabilities: Secondary | ICD-10-CM | POA: Diagnosis not present

## 2018-01-08 DIAGNOSIS — Z915 Personal history of self-harm: Secondary | ICD-10-CM

## 2018-01-08 DIAGNOSIS — F3131 Bipolar disorder, current episode depressed, mild: Secondary | ICD-10-CM | POA: Diagnosis not present

## 2018-01-08 DIAGNOSIS — Z87891 Personal history of nicotine dependence: Secondary | ICD-10-CM

## 2018-01-08 DIAGNOSIS — J019 Acute sinusitis, unspecified: Secondary | ICD-10-CM | POA: Diagnosis not present

## 2018-01-08 DIAGNOSIS — Z818 Family history of other mental and behavioral disorders: Secondary | ICD-10-CM

## 2018-01-08 DIAGNOSIS — F419 Anxiety disorder, unspecified: Secondary | ICD-10-CM | POA: Diagnosis not present

## 2018-01-08 MED ORDER — ALPRAZOLAM 0.5 MG PO TABS: ORAL_TABLET | ORAL | 2 refills | Status: DC

## 2018-01-08 MED ORDER — FLUOXETINE HCL 10 MG PO CAPS: 10.0000 mg | ORAL_CAPSULE | Freq: Every day | ORAL | 2 refills | Status: DC

## 2018-01-08 MED ORDER — LAMOTRIGINE 25 MG PO TABS: 75.0000 mg | ORAL_TABLET | Freq: Every day | ORAL | 2 refills | Status: DC

## 2018-01-08 MED ORDER — DOXYCYCLINE HYCLATE 100 MG PO TABS: 100.0000 mg | ORAL_TABLET | Freq: Two times a day (BID) | ORAL | 0 refills | Status: DC

## 2018-01-08 NOTE — Progress Notes (Signed)
BH MD/PA/NP OP Progress Note  01/08/2018 1:51 PM Maureen [name]  MRN:  [id number]  Chief Complaint: I noticed Prozac causing irritability and frustration.  But it is helping my depression.  HPI: Maureen [name] came for her follow-up appointment.  She is under a lot of stress because her 51 year old daughter is pregnant and due in October.  Her older daughter is also pregnant and due any day.  She is concerned about her younger daughter because she had a problem with drugs but she is pleased that since she got pregnant she is not using drugs.  Patient's mother also moved him closer to her house.  Overall she feels Prozac helping the depression but she noticed some time irritability, frustration, mood swings.  She like to reduce the Prozac.  She is taking Lamictal and denies any rash or any itching.  She denies any hallucination, paranoia, suicidal thoughts or homicidal thought.  Patient denies drinking alcohol or using any illegal substances.  She takes Xanax most of the time 0.5 mg but very rarely takes second if needed.  Her appetite is okay.  Her energy level is fair.  Visit Diagnosis:    ICD-10-CM   1. Bipolar affective disorder, currently depressed, mild (HCC) F31.31 lamoTRIgine (LAMICTAL) 25 MG tablet    FLUoxetine (PROZAC) 10 MG capsule    ALPRAZolam (XANAX) 0.5 MG tablet    Past Psychiatric History: Reviewed. Patient has long history of drug addiction and diagnosed with depression and bipolar disorder in her early 54s. She has one psychiatric hospitalization at age 86 when she took overdose on pills and require hospitalization. She had another brief ER visit few years later when she threatened to kill herself. She admitted history of severe mood swing, mania, impulsive behavior and depression. She had tried lithium, Prozac, Zyprexa, Abilify and increase dose of Lamictal with limited response. We tried [name] but she developed manic symptoms. Patient developed mania symptoms with antidepressant.  She has [facility] psychiatrists [name].  Past Medical History:  Past Medical History:  Diagnosis Date  . Abdominal pain   . Anxiety    takes Xanax daily as needed  . Arthritis   . Cancer ([place])    MOLE PRE  . Cerebrospinal fluid leak from spinal puncture 08/24/2012  . Chronic back pain   . Constipation    takes Dulcolax and Miralax daily as needed;also Fiber Pills  . Depression    takes Lamictal daily  . Fibromyalgia   . GERD (gastroesophageal reflux disease)    takes Omeprazole daily  . Headache(784.0)   . Heart murmur    slight  . History of bronchitis    last time many yrs ago(81yrs ago)  . Internal hemorrhoid   . Interstitial cystitis    no problems since beginning of Jan 2014  . Joint pain   . Joint swelling   . Lupus    takes Plaquenil daily  . Muscle spasm    takes Robaxin daily as needed  . Numbness and tingling in hands   . Osteoarthritis   . Pneumonia    hx of > 74yrs ago  . Raynaud's disease   . Urinary frequency   . Urinary urgency     Past Surgical History:  Procedure Laterality Date  . ABDOMINAL HERNIA REPAIR    . APPENDECTOMY    . CESAREAN SECTION    . CHOLECYSTECTOMY    . COLONOSCOPY    . DIAGNOSTIC  LAPAROSCOPY     adhesions  . ENDOMETRIAL ABLATION    . ESOPHAGOGASTRODUODENOSCOPY N/A 05/08/2013   Procedure: ESOPHAGOGASTRODUODENOSCOPY (EGD);  Surgeon: Rogene Houston, MD;  Location: [facility];  Service: Endoscopy;  Laterality: N/A;  315  . NASAL SEPTUM SURGERY    . OVARIAN CYST REMOVAL    . SHOULDER SURGERY Right    x 2  . SPINAL CORD STIMULATOR INSERTION N/A 06/13/2014   Procedure: LUMBAR SPINAL CORD STIMULATOR INSERTION;  Surgeon: Bonna Gains, MD;  Location: [facility];  Service: Neurosurgery;  Laterality: N/A;  . SPINAL FUSION     x 2   . TONSILLECTOMY    . TUBAL LIGATION    . uterine ablation    . VAGINAL HYSTERECTOMY  8/12    Family Psychiatric History:  Reviewed.  Family History:  Family History  Problem Relation Age of Onset  . Hyperlipidemia Mother   . Depression Mother   . Anxiety disorder Mother   . Ovarian cancer Maternal Aunt   . Colon cancer Maternal Uncle   . Colon cancer Paternal Aunt   . Breast cancer Maternal Grandmother   . Alzheimer's disease Maternal Grandmother     Social History:  Social History   Socioeconomic History  . Marital status: Legally Separated    Spouse name: None  . Number of children: None  . Years of education: None  . Highest education level: None  Social Needs  . Financial resource strain: None  . Food insecurity - worry: None  . Food insecurity - inability: None  . Transportation needs - medical: None  . Transportation needs - non-medical: None  Occupational History  . None  Tobacco Use  . Smoking status: Former Smoker    Packs/day: 0.50    Years: 20.00    Pack years: 10.00    Types: Cigarettes    Last attempt to quit: 06/09/2008    Years since quitting: 9.5  . Smokeless tobacco: Never Used  Substance and Sexual Activity  . Alcohol use: No    Alcohol/week: 0.0 oz  . Drug use: No  . Sexual activity: Yes    Partners: Male    Birth control/protection: Surgical  Other Topics Concern  . None  Social History Narrative  . None    Allergies:  Allergies  Allergen Reactions  . Amoxicillin Rash  . Penicillins Rash    Has patient had a PCN reaction causing immediate rash, facial/tongue/throat swelling, SOB or lightheadedness with hypotension: yes Has patient had a PCN reaction causing severe rash involving mucus membranes or skin necrosis: yes pt did experience rash but it was not severe  Has patient had a PCN reaction that required hospitalization: no Has patient had a PCN reaction occurring within the last 10 years: no If all of the above answers are "NO", then may proceed with Cephalosporin use.   . Tegretol [Carbamazepine] Rash    Metabolic Disorder Labs: No results found  for: HGBA1C, MPG No results found for: PROLACTIN No results found for: CHOL, TRIG, HDL, CHOLHDL, VLDL, LDLCALC Lab Results  Component Value Date   TSH 1.000 02/18/2015    Therapeutic Level Labs: No results found for: LITHIUM No results found for: VALPROATE No components found for:  CBMZ  Current Medications: Current Outpatient Medications  Medication Sig Dispense Refill  . ALPRAZolam (XANAX) 0.5 MG tablet Take 1 tab at bed time as 2nd as needed 35 tablet 2  . carisoprodol (SOMA) 350 MG tablet Take 350 mg by mouth  2 (two) times daily as needed for muscle spasms.   2  . FLUoxetine (PROZAC) 20 MG capsule Take 1 capsule (20 mg total) by mouth daily. 30 capsule 2  . gabapentin (NEURONTIN) 300 MG capsule Take 1,200 mg by mouth at bedtime.     . hydrochlorothiazide (HYDRODIURIL) 25 MG tablet TAKE 1 TABLET (25 MG TOTAL) BY MOUTH DAILY. 90 tablet 0  . HYDROcodone-acetaminophen (NORCO) 7.5-325 MG tablet Take 1 tablet by mouth every 6 (six) hours as needed for moderate pain. 120 tablet 0  . lamoTRIgine (LAMICTAL) 25 MG tablet Take 3 tablets (75 mg total) by mouth daily. 90 tablet 2  . oxyCODONE-acetaminophen (PERCOCET/ROXICET) 5-325 MG tablet Take 1 tablet by mouth at bedtime as needed. Pain.  0  . polyethylene glycol (MIRALAX / GLYCOLAX) packet TAKE 17 G BY MOUTH DAILY. 30 packet 5  . azaTHIOprine (IMURAN) 50 MG tablet Take 150 mg by mouth daily.     . diclofenac (VOLTAREN) 75 MG EC tablet Take 1 tablet (75 mg total) by mouth 2 (two) times daily as needed. (Patient not taking: Reported on 01/08/2018) 10 tablet 0  . ondansetron (ZOFRAN-ODT) 4 MG disintegrating tablet Take 1 tablet (4 mg total) by mouth every 8 (eight) hours as needed for nausea or vomiting. (Patient not taking: Reported on 01/08/2018) 20 tablet 1   No current facility-administered medications for this visit.      Musculoskeletal: Strength & Muscle Tone: within normal limits Gait & Station: normal Patient leans:  N/A  Psychiatric Specialty Exam: ROS  Blood pressure 122/74, pulse 67, height 5\' 4"  (1.626 m), weight 151 lb 3.2 oz (68.6 kg).Body mass index is 25.95 kg/m.  General Appearance: Casual  Eye Contact:  Good  Speech:  Clear and Coherent  Volume:  Normal  Mood:  Anxious  Affect:  Appropriate  Thought Process:  Goal Directed  Orientation:  Full (Time, Place, and Person)  Thought Content: Rumination   Suicidal Thoughts:  No  Homicidal Thoughts:  No  Memory:  Immediate;   Good Recent;   Good Remote;   Good  Judgement:  Good  Insight:  Good  Psychomotor Activity:  Normal  Concentration:  Concentration: Fair and Attention Span: Fair  Recall:  [place] of Knowledge: Good  Language: Good  Akathisia:  No  Handed:  Right  AIMS (if indicated): not done  Assets:  Communication Skills Desire for Improvement Housing Resilience Social Support  ADL's:  Intact  Cognition: WNL  Sleep:  Fair   Screenings: PHQ2-9     Clinical Support from 12/05/2017 in [place] Visit from 11/21/2017 in [place] Visit from 06/30/2017 in [place] Office Visit from 05/19/2017 in [place] Visit from 10/06/2016 in [place]  PHQ-2 Total Score  0  0  0  0  2  PHQ-9 Total Score  No data  No data  No data  No data  10       Assessment and Plan: Bipolar disorder type I.  Anxiety disorder NOS.  Recommended to try Prozac 10 mg to help with irritability.  Patient does not want to stop the Prozac because it is helping her depression.  Reassurance given.  Recommended counseling but patient declined.  Continue Lamictal 75 mg daily and Xanax 0.5 mg as prescribed.  Recommended to call us back if she has any question or any concern.  Follow-up in 3 months.   Arlyce Harman  Ahnesti Townsend, MD 01/08/2018, 1:51 PM

## 2018-01-08 NOTE — Patient Instructions (Signed)
Fever reducer and headache: tylenol and ibuprofen, can take together or alternating   Sinus pressure:  Nasal steroid such as flonase/fluticaone or nasocort daily Can also take daily antihistamine such as loratadine/claritin or cetirizine/zyrtec  Sinus rinses/irritation: [place] or similar with distilled water 2-3 times a day to clear out sinuses or Normal saline nasal spray  Sore throat:  Throat lozenges chloroseptic spray  Stick with bland foods Drink lots of fluids  

## 2018-01-08 NOTE — Progress Notes (Signed)
  Subjective:   Patient ID: Maureen [name], female    DOB: 22-Jan-1966, 52 y.o.   MRN: [id number] CC: sinus problems   HPI: Maureen [name] is a 52 y.o. female presenting for sinus problems.  Had surgery 3 weeks ago to fix a deviated septum.  Has had swelling, nasal drainage since then.  Had the flu the week before the surgery.  She thinks for the last week she has had even more nasal drainage, tightness across her nose and across her forehead.  No fevers that she knows of.  Appetite has been okay.  Thinks the nasal discharge has been getting thicker.  Has been using saltwater nose sprays regularly.  Taking Allegra regularly and recently switched to Zyrtec.  Tried an oral decongestant, she thinks it made it worse.  Taking ibuprofen as she is able to, does agitate her stomach.  She does have a history of lupus, is on immunosuppressants.  She held these for the surgery.  Now back on them.  She spoke with her surgeon's office about above symptoms, they told her to follow-up with her primary care doctor.  Relevant past medical, surgical, family and social history reviewed. Allergies and medications reviewed and updated. Social History   Tobacco Use  Smoking Status Former Smoker  . Packs/day: 0.50  . Years: 20.00  . Pack years: 10.00  . Types: Cigarettes  . Last attempt to quit: 06/09/2008  . Years since quitting: 9.5  Smokeless Tobacco Never Used   ROS: Per HPI   Objective:    BP 104/65   Pulse 81   Temp (!) 97.5 F (36.4 C) (Oral)   Ht 5\' 4"  (1.626 m)   Wt 150 lb 3.2 oz (68.1 kg)   BMI 25.78 kg/m   Wt Readings from Last 3 Encounters:  01/08/18 150 lb 3.2 oz (68.1 kg)  12/05/17 149 lb (67.6 kg)  11/21/17 150 lb (68 kg)    Gen: NAD, alert, cooperative with exam, NCAT EYES: EOMI, no conjunctival injection, or no icterus ENT: Splayed light reflex right TM, slightly pink bilaterally.  OP without erythema LYMPH: no cervical LAD.  Tender to palpation over frontal sinuses, maxillary  sinuses. CV: NRRR, normal S1/S2, no murmur, distal pulses 2+ b/l Resp: CTABL, no wheezes, normal WOB Abd: +BS, soft, NTND. no guarding or organomegaly Ext: No edema, warm Neuro: Alert and oriented, strength equal b/l UE and LE, coordination grossly normal MSK: normal muscle bulk  Assessment & Plan:  Maureen [name] was seen today for sinus problems.  Diagnoses and all orders for this visit:  Acute non-recurrent sinusitis, unspecified location Encourage sinus rinses, ibuprofen as able.  Start below.  Return precautions discussed. -     doxycycline (VIBRA-TABS) 100 MG tablet; Take 1 tablet (100 mg total) by mouth 2 (two) times daily.   Follow up plan: Return if symptoms worsen or fail to improve. Assunta Found, MD [place]

## 2018-01-27 ENCOUNTER — Telehealth (HOSPITAL_COMMUNITY): Payer: Self-pay

## 2018-01-27 NOTE — Telephone Encounter (Signed)
Patient has decreased her dose of Prozac, she is still having issues with it and would like to stop. She said she is really having a lot of irritability, agitation and sharing information that she is not meaning to share. Please review and advise, thank you

## 2018-01-29 ENCOUNTER — Other Ambulatory Visit (HOSPITAL_COMMUNITY): Payer: Self-pay | Admitting: Psychiatry

## 2018-01-29 NOTE — Telephone Encounter (Signed)
Called patient and informed her of the message from Dr. Adele Schilder. Patient stated that she had stop taking the medication a few days ago and she is feeling much better.

## 2018-01-29 NOTE — Telephone Encounter (Signed)
She should stop the Prozac.

## 2018-03-05 DIAGNOSIS — I7300 Raynaud's syndrome without gangrene: Secondary | ICD-10-CM | POA: Diagnosis not present

## 2018-03-05 DIAGNOSIS — M329 Systemic lupus erythematosus, unspecified: Secondary | ICD-10-CM | POA: Diagnosis not present

## 2018-03-05 DIAGNOSIS — Z79899 Other long term (current) drug therapy: Secondary | ICD-10-CM | POA: Diagnosis not present

## 2018-03-08 DIAGNOSIS — M545 Low back pain: Secondary | ICD-10-CM | POA: Diagnosis not present

## 2018-03-08 DIAGNOSIS — Z9689 Presence of other specified functional implants: Secondary | ICD-10-CM | POA: Diagnosis not present

## 2018-03-08 DIAGNOSIS — M542 Cervicalgia: Secondary | ICD-10-CM | POA: Diagnosis not present

## 2018-03-21 ENCOUNTER — Telehealth: Payer: Self-pay | Admitting: Obstetrics and Gynecology

## 2018-03-21 NOTE — Telephone Encounter (Signed)
Spoke with patient. Patient states that she has been having left breast pain. Reports her breast has felt swollen. Thought it was due to needing a new bra. Purchased a new bra and symptoms have persisted. States she has scar tissue from a previous broken rib under the left breast and was wondering if this could be causing discomfort. Denies redness, warmth, or nipple discharge. Advised will need to be seen for further evaluation. Patient is agreeable. Appointment scheduled for 6/5 at 1:15 pm with Dr.Jertson. Patient declines earlier appointment.  Routing to provider for final review. Patient agreeable to disposition. Will close encounter.

## 2018-03-21 NOTE — Telephone Encounter (Signed)
Patient is having issues with her left breast and scar tissue. Last seen 10/20/15

## 2018-03-26 NOTE — Progress Notes (Signed)
GYNECOLOGY  VISIT   HPI: 52 y.o.   Legally Separated  Caucasian  female   [phone] with No LMP recorded. Patient has had a hysterectomy.   here for left breast pain. The pain has been intermittent in the last few months. The pain is fairly generalized, there most of the time. The pain is aching, up to a 4/10 in severity. She has tried new bras. The other breast isn't uncomfortable.  Last mammogram was in 11/17, cat D, negative  GYNECOLOGIC HISTORY: No LMP recorded. Patient has had a hysterectomy. Contraception: Hysterectomy Menopausal hormone therapy: none        OB History    Gravida  2   Para  2   Term  1   Preterm  1   AB      Living  3     SAB      TAB      Ectopic      Multiple  1   Live Births  3              Patient Active Problem List   Diagnosis Date Noted  . Greater trochanteric bursitis of right hip 08/20/2015  . Fibrositis 01/29/2014  . Fibromyalgia 01/29/2014  . Atypical chest pain 09/02/2013  . Polypharmacy 05/01/2013  . Other long term (current) drug therapy 05/01/2013  . Arthritis 04/16/2013  . Abdominal pain, epigastric 03/26/2013  . Back pain, chronic 12/05/2012  . Connective tissue disease, undifferentiated ([place]) 12/05/2012  . Raynaud's syndrome without gangrene 12/05/2012  . Systemic lupus erythematosus ([place]) 12/05/2012  . Cerebrospinal fluid leak from spinal puncture 08/24/2012  . UNSPECIFIED DISORDER OF STOMACH AND DUODENUM 08/26/2008    Past Medical History:  Diagnosis Date  . Abdominal pain   . Anxiety    takes Xanax daily as needed  . Arthritis   . Cancer ([place])    MOLE PRE  . Cerebrospinal fluid leak from spinal puncture 08/24/2012  . Chronic back pain   . Constipation    takes Dulcolax and Miralax daily as needed;also Fiber Pills  . Depression    takes Lamictal daily  . Fibromyalgia   . GERD (gastroesophageal reflux disease)    takes Omeprazole daily  . Headache(784.0)   . Heart murmur    slight  . History of  bronchitis    last time many yrs ago(55yrs ago)  . Internal hemorrhoid   . Interstitial cystitis    no problems since beginning of Jan 2014  . Joint pain   . Joint swelling   . Lupus ([place])    takes Plaquenil daily  . Muscle spasm    takes Robaxin daily as needed  . Numbness and tingling in hands   . Osteoarthritis   . Pneumonia    hx of > 58yrs ago  . Raynaud's disease   . Urinary frequency   . Urinary urgency     Past Surgical History:  Procedure Laterality Date  . ABDOMINAL HERNIA REPAIR    . APPENDECTOMY    . CESAREAN SECTION    . CHOLECYSTECTOMY    . COLONOSCOPY    . DIAGNOSTIC LAPAROSCOPY     adhesions  . ENDOMETRIAL ABLATION    . ESOPHAGOGASTRODUODENOSCOPY N/A 05/08/2013   Procedure: ESOPHAGOGASTRODUODENOSCOPY (EGD);  Surgeon: Rogene Houston, MD;  Location: [facility];  Service: Endoscopy;  Laterality: N/A;  315  . NASAL SEPTUM SURGERY    . OVARIAN CYST REMOVAL    . SHOULDER SURGERY Right  x 2  . SPINAL CORD STIMULATOR INSERTION N/A 06/13/2014   Procedure: LUMBAR SPINAL CORD STIMULATOR INSERTION;  Surgeon: Bonna Gains, MD;  Location: [facility];  Service: Neurosurgery;  Laterality: N/A;  . SPINAL FUSION     x 2   . TONSILLECTOMY    . TUBAL LIGATION    . uterine ablation    . VAGINAL HYSTERECTOMY  8/12    Current Outpatient Medications  Medication Sig Dispense Refill  . ALPRAZolam (XANAX) 0.5 MG tablet Take 1 tab at bed time as 2nd as needed 35 tablet 2  . carisoprodol (SOMA) 350 MG tablet Take 350 mg by mouth 2 (two) times daily as needed for muscle spasms.   2  . Cholecalciferol (VITAMIN D3) 5000 units CAPS Take 1 tablet by mouth daily.    . folic acid (FOLVITE) 1 MG tablet Take 1 mg by mouth daily.  5  . gabapentin (NEURONTIN) 300 MG capsule Take 1,200 mg by mouth at bedtime.     . hydrochlorothiazide (HYDRODIURIL) 25 MG tablet TAKE 1 TABLET (25 MG TOTAL) BY MOUTH DAILY. 90 tablet 0  . HYDROcodone-acetaminophen (NORCO) 7.5-325 MG tablet Take 1  tablet by mouth every 6 (six) hours as needed for moderate pain. 120 tablet 0  . lamoTRIgine (LAMICTAL) 25 MG tablet Take 3 tablets (75 mg total) by mouth daily. 90 tablet 2  . methotrexate (RHEUMATREX) 2.5 MG tablet TAKE 4 TABLETS (10 MG TOTAL) BY MOUTH EVERY 7 DAYS.  0  . oxyCODONE-acetaminophen (PERCOCET/ROXICET) 5-325 MG tablet Take 1 tablet by mouth at bedtime as needed. Pain.  0   No current facility-administered medications for this visit.      ALLERGIES: Amoxicillin; Penicillins; and Tegretol [carbamazepine]  Family History  Problem Relation Age of Onset  . Hyperlipidemia Mother   . Depression Mother   . Anxiety disorder Mother   . Ovarian cancer Maternal Aunt   . Colon cancer Maternal Uncle   . Colon cancer Paternal Aunt   . Breast cancer Maternal Grandmother   . Alzheimer's disease Maternal Grandmother     Social History   Socioeconomic History  . Marital status: Legally Separated    Spouse name: Not on file  . Number of children: Not on file  . Years of education: Not on file  . Highest education level: Not on file  Occupational History  . Not on file  Social Needs  . Financial resource strain: Not on file  . Food insecurity:    Worry: Not on file    Inability: Not on file  . Transportation needs:    Medical: Not on file    Non-medical: Not on file  Tobacco Use  . Smoking status: Former Smoker    Packs/day: 0.50    Years: 20.00    Pack years: 10.00    Types: Cigarettes    Last attempt to quit: 06/09/2008    Years since quitting: 9.8  . Smokeless tobacco: Never Used  Substance and Sexual Activity  . Alcohol use: No    Alcohol/week: 0.0 oz  . Drug use: No  . Sexual activity: Yes    Partners: Male    Birth control/protection: Surgical  Lifestyle  . Physical activity:    Days per week: Not on file    Minutes per session: Not on file  . Stress: Not on file  Relationships  . Social connections:    Talks on phone: Not on file    Gets together: Not on  file  Attends religious service: Not on file    Active member of club or organization: Not on file    Attends meetings of clubs or organizations: Not on file    Relationship status: Not on file  . Intimate partner violence:    Fear of current or ex partner: Not on file    Emotionally abused: Not on file    Physically abused: Not on file    Forced sexual activity: Not on file  Other Topics Concern  . Not on file  Social History Narrative  . Not on file    Review of Systems  Constitutional: Negative.   HENT: Negative.   Eyes: Negative.   Respiratory: Negative.   Cardiovascular: Negative.   Gastrointestinal: Positive for nausea and vomiting.       Bloatin  Genitourinary: Negative.   Musculoskeletal: Negative.        Muscle/joint aches  Skin: Negative.   Neurological: Negative.   Endo/Heme/Allergies:       Heat/cold intolerance  Psychiatric/Behavioral: Negative.     PHYSICAL EXAMINATION:    BP (!) 100/58 (BP Location: Right Arm, Patient Position: Sitting, Cuff Size: Normal)   Pulse 76   Ht 5' 3.25" (1.607 m)   Wt 151 lb 6.4 oz (68.7 kg)   BMI 26.61 kg/m     General appearance: alert, cooperative and appears stated age Breasts: in the left breast at 1 o'clock is an increased area of nodularity, bean shaped 2 x 1 cm, tender, mobile. No other lumps, diffuse tenderness on the left, not tender in the right breast.  No supraclavicular or axillary adenopathy.  Patient examined sitting and supine   ASSESSMENT Left breast pain Left breast lump at 1 o'clock    PLAN Diagnostic breast imaging Information on breast pain given Recommend she f/u for a repeat breast exam and annual exam in 3 months   An After Visit Summary was printed and given to the patient.

## 2018-03-27 DIAGNOSIS — J3489 Other specified disorders of nose and nasal sinuses: Secondary | ICD-10-CM | POA: Diagnosis not present

## 2018-03-27 DIAGNOSIS — M950 Acquired deformity of nose: Secondary | ICD-10-CM | POA: Diagnosis not present

## 2018-03-28 ENCOUNTER — Other Ambulatory Visit: Payer: Self-pay

## 2018-03-28 ENCOUNTER — Ambulatory Visit: Payer: 59 | Admitting: Obstetrics and Gynecology

## 2018-03-28 ENCOUNTER — Encounter: Payer: Self-pay | Admitting: Obstetrics and Gynecology

## 2018-03-28 VITALS — BP 100/58 | HR 76 | Ht 63.25 in | Wt 151.4 lb

## 2018-03-28 DIAGNOSIS — N644 Mastodynia: Secondary | ICD-10-CM

## 2018-03-28 DIAGNOSIS — N6321 Unspecified lump in the left breast, upper outer quadrant: Secondary | ICD-10-CM | POA: Diagnosis not present

## 2018-03-28 NOTE — Progress Notes (Signed)
Patient scheduled while in office for bilateral Dx MMG and left breast US, if needed. Scheduled at [place] on 04/03/18 arriving at 12:40pm for 1pm appt. AEX rescheduled for 06/28/18 at 1:15pm with Dr. Talbert Nan to include breast recheck. Patient verbalizes understanding and is agreeable.

## 2018-03-28 NOTE — Patient Instructions (Signed)
To try and decrease your breast pain, you should have a well fitting supportive bra, cut back on caffeine, and use ice or heat as needed. Some women find relief with the supplement evening primrose oil.  

## 2018-04-03 ENCOUNTER — Ambulatory Visit
Admission: RE | Admit: 2018-04-03 | Discharge: 2018-04-03 | Disposition: A | Discharge status: Home / Self Care | Payer: 59 | Source: Ambulatory Visit | Attending: Obstetrics and Gynecology | Admitting: Obstetrics and Gynecology

## 2018-04-03 DIAGNOSIS — N6321 Unspecified lump in the left breast, upper outer quadrant: Secondary | ICD-10-CM

## 2018-04-03 DIAGNOSIS — N644 Mastodynia: Secondary | ICD-10-CM

## 2018-04-03 DIAGNOSIS — N6489 Other specified disorders of breast: Secondary | ICD-10-CM | POA: Diagnosis not present

## 2018-04-03 DIAGNOSIS — R922 Inconclusive mammogram: Secondary | ICD-10-CM | POA: Diagnosis not present

## 2018-04-10 ENCOUNTER — Ambulatory Visit (HOSPITAL_COMMUNITY): Payer: Self-pay | Admitting: Psychiatry

## 2018-04-11 ENCOUNTER — Encounter (HOSPITAL_COMMUNITY): Payer: Self-pay | Admitting: Psychiatry

## 2018-04-11 ENCOUNTER — Ambulatory Visit (INDEPENDENT_AMBULATORY_CARE_PROVIDER_SITE_OTHER): Payer: 59 | Admitting: Psychiatry

## 2018-04-11 VITALS — BP 102/66 | HR 73 | Ht 63.5 in | Wt 151.0 lb

## 2018-04-11 DIAGNOSIS — F411 Generalized anxiety disorder: Secondary | ICD-10-CM | POA: Diagnosis not present

## 2018-04-11 DIAGNOSIS — F3131 Bipolar disorder, current episode depressed, mild: Secondary | ICD-10-CM

## 2018-04-11 MED ORDER — LAMOTRIGINE 25 MG PO TABS: 75.0000 mg | ORAL_TABLET | Freq: Every day | ORAL | 2 refills | Status: DC

## 2018-04-11 MED ORDER — ALPRAZOLAM 0.5 MG PO TABS: ORAL_TABLET | ORAL | 2 refills | Status: DC

## 2018-04-11 NOTE — Progress Notes (Addendum)
BH MD/PA/NP OP Progress Note  04/11/2018 12:55 PM Maureen [name]  MRN:  [id number]  Chief Complaint: I stopped taking Prozac because it was making him more irritable.  I am feeling better now.  HPI: Patient came for her follow-up appointment.  She is no longer taking Prozac because it was making her more irritable and angry and agitated.  She is feeling better on Lamictal.  She takes Xanax at bedtime.  Recently her physician is started her on methotrexate for joint pain.  She is hoping it helps her lupus and joint pain.  She is pleased that her daughter who is pregnant stop doing drugs.  She is due in October.  Patient is still lives with her mother but hoping to move out soon to have her own place.  Patient denies any irritability, anger, mania, psychosis or any hallucination.  Her appetite is okay.  Her vital signs are stable.  She has no tremors, rash or any itching.  Patient denies drinking alcohol or using any illegal substances.  She denies any feeling of hopelessness or worthlessness.  Visit Diagnosis:    ICD-10-CM   1. Bipolar affective disorder, currently depressed, mild (HCC) F31.31 lamoTRIgine (LAMICTAL) 25 MG tablet    ALPRAZolam (XANAX) 0.5 MG tablet  2. GAD (generalized anxiety disorder) F41.1     Past Psychiatric History: Reviewed. Patient has long history of drug addiction and diagnosed with depression and bipolar disorder in her early 30s. She has one psychiatric hospitalization at age 72 when she took overdose on her pills. She had another brief ER visit few years later when she threatened to kill herself. She admitted history of severe mood swing, mania, impulsive behavior and depression. She had tried lithium, Prozac, Zyprexa, Abilify and increase dose of Lamictal with limited response. We tried [place] but she developed manic symptoms. Patient developed mania symptoms with antidepressant. She has seen psychiatrists at [facility] and then day [place] at  [facility] mental [facility].   Past Medical History:  Past Medical History:  Diagnosis Date  . Abdominal pain   . Anxiety    takes Xanax daily as needed  . Arthritis   . Cancer ([place])    MOLE PRE  . Cerebrospinal fluid leak from spinal puncture 08/24/2012  . Chronic back pain   . Constipation    takes Dulcolax and Miralax daily as needed;also Fiber Pills  . Depression    takes Lamictal daily  . Fibromyalgia   . GERD (gastroesophageal reflux disease)    takes Omeprazole daily  . Headache(784.0)   . Heart murmur    slight  . History of bronchitis    last time many yrs ago(14yrs ago)  . Internal hemorrhoid   . Interstitial cystitis    no problems since beginning of Jan 2014  . Joint pain   . Joint swelling   . Lupus ([place])    takes Plaquenil daily  . Muscle spasm    takes Robaxin daily as needed  . Numbness and tingling in hands   . Osteoarthritis   . Pneumonia    hx of > 8yrs ago  . Raynaud's disease   . Urinary frequency   . Urinary urgency     Past Surgical History:  Procedure Laterality Date  . ABDOMINAL HERNIA REPAIR    . APPENDECTOMY    . CESAREAN SECTION    . CHOLECYSTECTOMY    . COLONOSCOPY    . DIAGNOSTIC LAPAROSCOPY     adhesions  .  ENDOMETRIAL ABLATION    . ESOPHAGOGASTRODUODENOSCOPY N/A 05/08/2013   Procedure: ESOPHAGOGASTRODUODENOSCOPY (EGD);  Surgeon: Rogene Houston, MD;  Location: [facility] SUITE;  Service: Endoscopy;  Laterality: N/A;  315  . NASAL SEPTUM SURGERY    . OVARIAN CYST REMOVAL    . SHOULDER SURGERY Right    x 2  . SPINAL CORD STIMULATOR INSERTION N/A 06/13/2014   Procedure: LUMBAR SPINAL CORD STIMULATOR INSERTION;  Surgeon: Bonna Gains, MD;  Location: [facility] NEURO ORS;  Service: Neurosurgery;  Laterality: N/A;  . SPINAL FUSION     x 2   . TONSILLECTOMY    . TUBAL LIGATION    . uterine ablation    . VAGINAL HYSTERECTOMY  8/12    Family Psychiatric History: Reviewed.  Family History:  Family History  Problem Relation Age of  Onset  . Hyperlipidemia Mother   . Depression Mother   . Anxiety disorder Mother   . Ovarian cancer Maternal Aunt   . Colon cancer Maternal Uncle   . Colon cancer Paternal Aunt   . Breast cancer Maternal Grandmother   . Alzheimer's disease Maternal Grandmother     Social History:  Social History   Socioeconomic History  . Marital status: Legally Separated    Spouse name: Not on file  . Number of children: Not on file  . Years of education: Not on file  . Highest education level: Not on file  Occupational History  . Not on file  Social Needs  . Financial resource strain: Not on file  . Food insecurity:    Worry: Not on file    Inability: Not on file  . Transportation needs:    Medical: Not on file    Non-medical: Not on file  Tobacco Use  . Smoking status: Former Smoker    Packs/day: 0.50    Years: 20.00    Pack years: 10.00    Types: Cigarettes    Last attempt to quit: 06/09/2008    Years since quitting: 9.8  . Smokeless tobacco: Never Used  Substance and Sexual Activity  . Alcohol use: No    Alcohol/week: 0.0 oz  . Drug use: No  . Sexual activity: Yes    Partners: Male    Birth control/protection: Surgical  Lifestyle  . Physical activity:    Days per week: Not on file    Minutes per session: Not on file  . Stress: Not on file  Relationships  . Social connections:    Talks on phone: Not on file    Gets together: Not on file    Attends religious service: Not on file    Active member of club or organization: Not on file    Attends meetings of clubs or organizations: Not on file    Relationship status: Not on file  Other Topics Concern  . Not on file  Social History Narrative  . Not on file    Allergies:  Allergies  Allergen Reactions  . Amoxicillin Rash  . Penicillins Rash    Has patient had a PCN reaction causing immediate rash, facial/tongue/throat swelling, SOB or lightheadedness with hypotension: yes Has patient had a PCN reaction causing severe  rash involving mucus membranes or skin necrosis: yes pt did experience rash but it was not severe  Has patient had a PCN reaction that required hospitalization: no Has patient had a PCN reaction occurring within the last 10 years: no If all of the above answers are "NO", then may proceed with Cephalosporin  use.   . Tegretol [Carbamazepine] Rash    Metabolic Disorder Labs: No results found for: HGBA1C, MPG No results found for: PROLACTIN No results found for: CHOL, TRIG, HDL, CHOLHDL, VLDL, LDLCALC Lab Results  Component Value Date   TSH 1.000 02/18/2015    Therapeutic Level Labs: No results found for: LITHIUM No results found for: VALPROATE No components found for:  CBMZ  Current Medications: Current Outpatient Medications  Medication Sig Dispense Refill  . ALPRAZolam (XANAX) 0.5 MG tablet Take 1 tab at bed time as 2nd as needed 35 tablet 2  . carisoprodol (SOMA) 350 MG tablet Take 350 mg by mouth 2 (two) times daily as needed for muscle spasms.   2  . Cholecalciferol (VITAMIN D3) 5000 units CAPS Take 1 tablet by mouth daily.    . folic acid (FOLVITE) 1 MG tablet Take 1 mg by mouth daily.  5  . gabapentin (NEURONTIN) 300 MG capsule Take 1,200 mg by mouth at bedtime.     . hydrochlorothiazide (HYDRODIURIL) 25 MG tablet TAKE 1 TABLET (25 MG TOTAL) BY MOUTH DAILY. 90 tablet 0  . HYDROcodone-acetaminophen (NORCO) 7.5-325 MG tablet Take 1 tablet by mouth every 6 (six) hours as needed for moderate pain. 120 tablet 0  . lamoTRIgine (LAMICTAL) 25 MG tablet Take 3 tablets (75 mg total) by mouth daily. 90 tablet 2  . methotrexate (RHEUMATREX) 2.5 MG tablet TAKE 4 TABLETS (10 MG TOTAL) BY MOUTH EVERY 7 DAYS.  0  . oxyCODONE-acetaminophen (PERCOCET/ROXICET) 5-325 MG tablet Take 1 tablet by mouth at bedtime as needed. Pain.  0   No current facility-administered medications for this visit.      Musculoskeletal: Strength & Muscle Tone: within normal limits Gait & Station: normal Patient  leans: N/A  Psychiatric Specialty Exam: Review of Systems  Musculoskeletal: Positive for joint pain.    Blood pressure 102/66, pulse 73, height 5' 3.5" (1.613 m), weight 151 lb (68.5 kg), SpO2 97 %.Body mass index is 26.33 kg/m.  General Appearance: Casual  Eye Contact:  Good  Speech:  Clear and Coherent  Volume:  Normal  Mood:  Anxious  Affect:  Appropriate  Thought Process:  Goal Directed  Orientation:  Full (Time, Place, and Person)  Thought Content: Logical   Suicidal Thoughts:  No  Homicidal Thoughts:  No  Memory:  Immediate;   Good Recent;   Good Remote;   Good  Judgement:  Good  Insight:  Good  Psychomotor Activity:  Normal  Concentration:  Concentration: Good and Attention Span: Good  Recall:  Good  Fund of Knowledge: Good  Language: Good  Akathisia:  No  Handed:  Right  AIMS (if indicated): not done  Assets:  Communication Skills Desire for Improvement Housing Resilience Social Support  ADL's:  Intact  Cognition: WNL  Sleep:  Good   Screenings: PHQ2-9     Office Visit from 01/08/2018 in [place] from 12/05/2017 in [place] Visit from 11/21/2017 in [place] Office Visit from 06/30/2017 in [place] Office Visit from 05/19/2017 in [place]  PHQ-2 Total Score  0  0  0  0  0       Assessment and Plan: Bipolar disorder type I.  Generalized anxiety disorder.  Discontinue Prozac.  Patient is no longer taking it.  Continue Lamictal 75 mg daily and Xanax 0.5 mg as needed for anxiety.  She has no rash, itching, tremors or shakes.  Patient is not interested in counseling.  Recommended to call us back if she has any question or any concern.  Follow-up in 3 months.   [name], MD 04/11/2018, 12:55 PM

## 2018-04-21 ENCOUNTER — Other Ambulatory Visit: Payer: Self-pay | Admitting: Family

## 2018-05-14 ENCOUNTER — Ambulatory Visit: Payer: 59 | Admitting: Obstetrics and Gynecology

## 2018-05-15 ENCOUNTER — Encounter: Payer: Self-pay | Admitting: Family

## 2018-05-15 ENCOUNTER — Ambulatory Visit (INDEPENDENT_AMBULATORY_CARE_PROVIDER_SITE_OTHER): Payer: 59 | Admitting: Family

## 2018-05-15 VITALS — BP 120/87 | HR 68 | Ht 63.5 in | Wt 151.0 lb

## 2018-05-15 DIAGNOSIS — R238 Other skin changes: Secondary | ICD-10-CM | POA: Diagnosis not present

## 2018-05-15 MED ORDER — TRIAMCINOLONE ACETONIDE 0.5 % EX OINT: 1.0000 "application " | TOPICAL_OINTMENT | Freq: Two times a day (BID) | CUTANEOUS | 0 refills | Status: DC

## 2018-05-15 NOTE — Progress Notes (Signed)
   Subjective:    Patient ID: Maureen [name], female    DOB: [birth year], 52 y.o.   MRN: [id number]  Chief Complaint  Patient presents with  . Rash    Rash  This is a new problem. The current episode started in the past 7 days. The problem is unchanged. The affected locations include the right upper leg, left upper leg, right lower leg, left lower leg and back. The rash is characterized by redness. She was exposed to an insect bite/sting. Pertinent negatives include no congestion, cough, diarrhea, fatigue, fever, shortness of breath or sore throat. Past treatments include antihistamine and anti-itch cream. The treatment provided mild relief.      Review of Systems  Constitutional: Negative for fatigue and fever.  HENT: Negative for congestion and sore throat.   Respiratory: Negative for cough and shortness of breath.   Gastrointestinal: Negative for diarrhea.  Skin: Positive for rash.  All other systems reviewed and are negative.      Objective:   Physical Exam  Constitutional: She is oriented to person, place, and time. She appears well-developed and well-nourished. No distress.  HENT:  Head: Normocephalic and atraumatic.  Eyes: Pupils are equal, round, and reactive to light.  Neck: Normal range of motion. Neck supple. No thyromegaly present.  Cardiovascular: Normal rate, regular rhythm, normal heart sounds and intact distal pulses.  No murmur heard. Pulmonary/Chest: Effort normal and breath sounds normal. No respiratory distress. She has no wheezes.  Abdominal: Soft. Bowel sounds are normal. She exhibits no distension. There is no tenderness.  Musculoskeletal: Normal range of motion. She exhibits no edema or tenderness.  Neurological: She is alert and oriented to person, place, and time. She has normal reflexes. No cranial nerve deficit.  Skin: Skin is warm and dry. Rash noted. Rash is vesicular (scattered vesiscular rash on legs, approx 6 on bilateral legs).     Psychiatric:  She has a normal mood and affect. Her behavior is normal. Judgment and thought content normal.  Vitals reviewed.     BP 120/87   Pulse 68   Ht 5' 3.5" (1.613 m)   Wt 151 lb (68.5 kg)   BMI 26.33 kg/m      Assessment & Plan:  Vasilia was seen today for rash.  Diagnoses and all orders for this visit:  Rash, vesicular -     triamcinolone ointment (KENALOG) 0.5 %; Apply 1 application topically 2 (two) times daily.  Do not pick or scratch More than likely contact dermatitis However, could be viral Let us know if symptoms worsen or do not improve   Evelina Dun, FNP

## 2018-05-15 NOTE — Patient Instructions (Signed)
Blisters, Adult A blister is a raised bubble of skin filled with liquid. Blisters often develop in an area of the skin that repeatedly rubs or presses against another surface (friction blister). Friction blisters can occur on any part of the body, but they usually develop on the hands or feet. Long-term pressure on the same area of the skin can also lead to areas of hardened skin (calluses). What are the causes? A blister can be caused by:  An injury.  A burn.  An allergic reaction.  An infection.  Exposure to irritating chemicals.  Friction, especially in an area with a lot of heat and moisture.  Friction blisters often result from:  Sports.  Repetitive activities.  Using tools and doing other activities without wearing gloves.  Shoes that are too tight or too loose.  What are the signs or symptoms? A blister is often round and looks like a bump. It may:  Itch.  Be painful to the touch.  Before a blister forms, the skin may:  Become red.  Feel warm.  Itch.  Be painful to the touch.  How is this diagnosed? A blister is diagnosed with a physical exam. How is this treated? Treatment usually involves protecting the area where the blister has formed until the skin has healed. Other treatments may include:  A bandage (dressing) to cover the blister.  Extra padding around and over the blister, so that it does not rub on anything.  Antibiotic ointment.  Most blisters break open, dry up, and go away on their own within 1-2 weeks. Blisters that are very painful may be drained before they break open on their own. If the blister is large or painful, it can be drained by: 1. Sterilizing a small needle with rubbing alcohol. 2. Washing your hands with soap and water. 3. Inserting the needle in the edge of the blister to make a small hole. Some fluid will drain out of the hole. Let the top or roof of the blister stay in place. This helps the skin heal. 4. Washing the  blister with mild soap and water. 5. Covering the blister with antibiotic ointment, if prescribed by your health care provider, and a dressing.  Some blisters may need to be drained by a health care provider. Follow these instructions at home:  Protect the area where the blister has formed as told by your health care provider.  Keep your blister clean and dry. This helps to prevent infection.  Do not pop your blister. This can cause infection.  If you were prescribed an antibiotic, use it as told by your health care provider. Do not stop using the antibiotic even if your condition improves.  Wear different shoes until the blister heals.  Avoid the activity that caused the blister until your blister heals.  Check your blister every day for signs of infection. Check for: ? More redness, swelling, or pain. ? More fluid or blood. ? Warmth. ? Pus or a bad smell. ? The blister getting better and then getting worse. How is this prevented? Taking these steps can help to prevent blisters that are caused by friction. Make sure you:  Wear comfortable shoes that fit well.  Always wear socks with shoes.  Wear extra socks or use tape, bandages, or pads over blister-prone areas as needed. You may also apply petroleum jelly under bandages in blister-prone areas.  Wear protective gear, such as gloves, when participating in sports or activities that can cause blisters.  Wear loose-fitting,   moisture-wicking clothes when participating in sports or activities.  Use powders as needed to keep your feet dry.  Contact a health care provider if:  You have more redness, swelling, or pain around your blister.  You have more fluid or blood coming from your blister.  Your blister feels warm to the touch.  You have pus or a bad smell coming from your blister.  You have a fever or chills.  Your blister gets better and then it gets worse. This information is not intended to replace advice given to  you by your health care provider. Make sure you discuss any questions you have with your health care provider. Document Released: 11/17/2004 Document Revised: 06/08/2016 Document Reviewed: 04/22/2016 Elsevier Interactive Patient Education  2018 [employer]  

## 2018-05-21 ENCOUNTER — Other Ambulatory Visit: Payer: Self-pay | Admitting: Family

## 2018-06-05 DIAGNOSIS — M545 Low back pain: Secondary | ICD-10-CM | POA: Diagnosis not present

## 2018-06-05 DIAGNOSIS — R030 Elevated blood-pressure reading, without diagnosis of hypertension: Secondary | ICD-10-CM | POA: Diagnosis not present

## 2018-06-05 DIAGNOSIS — M542 Cervicalgia: Secondary | ICD-10-CM | POA: Diagnosis not present

## 2018-06-07 DIAGNOSIS — M359 Systemic involvement of connective tissue, unspecified: Secondary | ICD-10-CM | POA: Diagnosis not present

## 2018-06-07 DIAGNOSIS — Z79899 Other long term (current) drug therapy: Secondary | ICD-10-CM | POA: Diagnosis not present

## 2018-06-07 DIAGNOSIS — Z5181 Encounter for therapeutic drug level monitoring: Secondary | ICD-10-CM | POA: Diagnosis not present

## 2018-06-07 DIAGNOSIS — I7300 Raynaud's syndrome without gangrene: Secondary | ICD-10-CM | POA: Diagnosis not present

## 2018-06-11 DIAGNOSIS — M329 Systemic lupus erythematosus, unspecified: Secondary | ICD-10-CM | POA: Diagnosis not present

## 2018-06-22 DIAGNOSIS — M9901 Segmental and somatic dysfunction of cervical region: Secondary | ICD-10-CM | POA: Diagnosis not present

## 2018-06-22 DIAGNOSIS — M5414 Radiculopathy, thoracic region: Secondary | ICD-10-CM | POA: Diagnosis not present

## 2018-06-22 DIAGNOSIS — M9902 Segmental and somatic dysfunction of thoracic region: Secondary | ICD-10-CM | POA: Diagnosis not present

## 2018-06-27 ENCOUNTER — Telehealth: Payer: Self-pay | Admitting: Obstetrics and Gynecology

## 2018-06-27 NOTE — Telephone Encounter (Signed)
Patient called and left a message at lunch cancelling her appointment for and AEX and breast recheck with Dr. Talbert Nan for tomorrow, 06/27/18 at 1:15. I returned her call to reschedule the appointment but had to leave her a message to call back.   Routing to the provider for [place].

## 2018-06-28 ENCOUNTER — Ambulatory Visit: Payer: Self-pay | Admitting: Obstetrics and Gynecology

## 2018-06-28 DIAGNOSIS — M9902 Segmental and somatic dysfunction of thoracic region: Secondary | ICD-10-CM | POA: Diagnosis not present

## 2018-06-28 DIAGNOSIS — M5414 Radiculopathy, thoracic region: Secondary | ICD-10-CM | POA: Diagnosis not present

## 2018-06-28 DIAGNOSIS — M9901 Segmental and somatic dysfunction of cervical region: Secondary | ICD-10-CM | POA: Diagnosis not present

## 2018-07-09 ENCOUNTER — Ambulatory Visit (HOSPITAL_COMMUNITY): Payer: 59 | Admitting: Psychiatry

## 2018-07-09 ENCOUNTER — Encounter (HOSPITAL_COMMUNITY): Payer: Self-pay | Admitting: Psychiatry

## 2018-07-09 ENCOUNTER — Ambulatory Visit (HOSPITAL_COMMUNITY): Payer: Self-pay | Admitting: Psychiatry

## 2018-07-09 DIAGNOSIS — Z87891 Personal history of nicotine dependence: Secondary | ICD-10-CM

## 2018-07-09 DIAGNOSIS — Z818 Family history of other mental and behavioral disorders: Secondary | ICD-10-CM | POA: Diagnosis not present

## 2018-07-09 DIAGNOSIS — F3131 Bipolar disorder, current episode depressed, mild: Secondary | ICD-10-CM

## 2018-07-09 DIAGNOSIS — Z79899 Other long term (current) drug therapy: Secondary | ICD-10-CM

## 2018-07-09 DIAGNOSIS — F411 Generalized anxiety disorder: Secondary | ICD-10-CM

## 2018-07-09 MED ORDER — LAMOTRIGINE 25 MG PO TABS: 75.0000 mg | ORAL_TABLET | Freq: Every day | ORAL | 2 refills | Status: DC

## 2018-07-09 MED ORDER — ALPRAZOLAM 0.5 MG PO TABS: ORAL_TABLET | ORAL | 2 refills | Status: DC

## 2018-07-09 NOTE — Progress Notes (Signed)
BH MD/PA/NP OP Progress Note  07/09/2018 11:10 AM Maureen [name]  MRN:  [id number]  Chief Complaint: I am doing better.  My daughter is due next month.  HPI: Patient came for her follow-up appointment.  She is taking her medication as prescribed.  Recently she had a flareup of lupus and she was given steroid which makes her more irritable and manic.  She is no longer taking the steroids.  Recently she seen her physician and prescribed a new medication for lupus.  She is no longer taking methotrexate because she could not tolerate.  She is happy that her daughter moved out as she enjoys her privacy.  Her daughter is due next month.  Patient denies any mania, psychosis or any hallucination.  She takes Xanax as needed.  She like her current dose of Lamictal which is helping her mood swing and irritability.  She denies drinking or using any illegal substances.  Her job is going very well.  Patient denies any feeling of hopelessness or worthlessness.  She denies any suicidal thoughts.  Her appetite is okay.  Her energy level is okay.  Visit Diagnosis:    ICD-10-CM   1. Bipolar affective disorder, currently depressed, mild (HCC) F31.31 lamoTRIgine (LAMICTAL) 25 MG tablet    ALPRAZolam (XANAX) 0.5 MG tablet    Past Psychiatric History: Reviewed. Patient has long history of drug addiction and diagnosed with depression and bipolar disorder in her early 35s. She has one psychiatric hospitalization at age 81 when she took overdose on her pills. She had another brief ER visit few years later when she threatened to kill herself. She admitted history of severe mood swing, mania, impulsive behavior and depression. She had tried lithium, Prozac, Zyprexa, Abilify and increase dose of Lamictal with limited response. Wetried [place] but she developed manic symptoms.Patient developed mania symptoms with antidepressant. She has seen psychiatrists at [place] and then day [place] at [facility] mental health.  Past Medical History:  Past Medical History:  Diagnosis Date  . Abdominal pain   . Anxiety    takes Xanax daily as needed  . Arthritis   . Cancer ([place])    MOLE PRE  . Cerebrospinal fluid leak from spinal puncture 08/24/2012  . Chronic back pain   . Constipation    takes Dulcolax and Miralax daily as needed;also Fiber Pills  . Depression    takes Lamictal daily  . Fibromyalgia   . GERD (gastroesophageal reflux disease)    takes Omeprazole daily  . Headache(784.0)   . Heart murmur    slight  . History of bronchitis    last time many yrs ago(25yrs ago)  . Internal hemorrhoid   . Interstitial cystitis    no problems since beginning of Jan 2014  . Joint pain   . Joint swelling   . Lupus ([place])    takes Plaquenil daily  . Muscle spasm    takes Robaxin daily as needed  . Numbness and tingling in hands   . Osteoarthritis   . Pneumonia    hx of > 57yrs ago  . Raynaud's disease   . Urinary frequency   . Urinary urgency     Past Surgical History:  Procedure Laterality Date  . ABDOMINAL HERNIA REPAIR    . APPENDECTOMY    . CESAREAN SECTION    . CHOLECYSTECTOMY    . COLONOSCOPY    . DIAGNOSTIC LAPAROSCOPY     adhesions  . ENDOMETRIAL ABLATION    .  ESOPHAGOGASTRODUODENOSCOPY N/A 05/08/2013   Procedure: ESOPHAGOGASTRODUODENOSCOPY (EGD);  Surgeon: Rogene Houston, MD;  Location: [facility];  Service: Endoscopy;  Laterality: N/A;  315  . NASAL SEPTUM SURGERY    . OVARIAN CYST REMOVAL    . SHOULDER SURGERY Right    x 2  . SPINAL CORD STIMULATOR INSERTION N/A 06/13/2014   Procedure: LUMBAR SPINAL CORD STIMULATOR INSERTION;  Surgeon: Bonna Gains, MD;  Location: [facility];  Service: Neurosurgery;  Laterality: N/A;  . SPINAL FUSION     x 2   . TONSILLECTOMY    . TUBAL LIGATION    . uterine ablation    . VAGINAL HYSTERECTOMY  8/12    Family Psychiatric History: Reviewed.  Family History:  Family History  Problem Relation Age of Onset  .  Hyperlipidemia Mother   . Depression Mother   . Anxiety disorder Mother   . Ovarian cancer Maternal Aunt   . Colon cancer Maternal Uncle   . Colon cancer Paternal Aunt   . Breast cancer Maternal Grandmother   . Alzheimer's disease Maternal Grandmother     Social History:  Social History   Socioeconomic History  . Marital status: Legally Separated    Spouse name: Not on file  . Number of children: Not on file  . Years of education: Not on file  . Highest education level: Not on file  Occupational History  . Not on file  Social Needs  . Financial resource strain: Not on file  . Food insecurity:    Worry: Not on file    Inability: Not on file  . Transportation needs:    Medical: Not on file    Non-medical: Not on file  Tobacco Use  . Smoking status: Former Smoker    Packs/day: 0.50    Years: 20.00    Pack years: 10.00    Types: Cigarettes    Last attempt to quit: 06/09/2008    Years since quitting: 10.0  . Smokeless tobacco: Never Used  Substance and Sexual Activity  . Alcohol use: No    Alcohol/week: 0.0 standard drinks  . Drug use: No  . Sexual activity: Yes    Partners: Male    Birth control/protection: Surgical  Lifestyle  . Physical activity:    Days per week: Not on file    Minutes per session: Not on file  . Stress: Not on file  Relationships  . Social connections:    Talks on phone: Not on file    Gets together: Not on file    Attends religious service: Not on file    Active member of club or organization: Not on file    Attends meetings of clubs or organizations: Not on file    Relationship status: Not on file  Other Topics Concern  . Not on file  Social History Narrative  . Not on file    Allergies:  Allergies  Allergen Reactions  . Amoxicillin Rash  . Penicillins Rash    Has patient had a PCN reaction causing immediate rash, facial/tongue/throat swelling, SOB or lightheadedness with hypotension: yes Has patient had a PCN reaction causing  severe rash involving mucus membranes or skin necrosis: yes pt did experience rash but it was not severe  Has patient had a PCN reaction that required hospitalization: no Has patient had a PCN reaction occurring within the last 10 years: no If all of the above answers are "NO", then may proceed with Cephalosporin use.   . Tegretol [  Carbamazepine] Rash    Metabolic Disorder Labs: No results found for: HGBA1C, MPG No results found for: PROLACTIN No results found for: CHOL, TRIG, HDL, CHOLHDL, VLDL, LDLCALC Lab Results  Component Value Date   TSH 1.000 02/18/2015    Therapeutic Level Labs: No results found for: LITHIUM No results found for: VALPROATE No components found for:  CBMZ  Current Medications: Current Outpatient Medications  Medication Sig Dispense Refill  . ALPRAZolam (XANAX) 0.5 MG tablet Take 1 tab at bed time as 2nd as needed 35 tablet 2  . carisoprodol (SOMA) 350 MG tablet Take 350 mg by mouth 2 (two) times daily as needed for muscle spasms.   2  . Cholecalciferol (VITAMIN D3) 5000 units CAPS Take 1 tablet by mouth daily.    . folic acid (FOLVITE) 1 MG tablet Take 1 mg by mouth daily.  5  . gabapentin (NEURONTIN) 300 MG capsule Take 1,200 mg by mouth at bedtime.     . hydrochlorothiazide (HYDRODIURIL) 25 MG tablet TAKE 1 TABLET BY MOUTH EVERY DAY 30 tablet 5  . HYDROcodone-acetaminophen (NORCO) 7.5-325 MG tablet Take 1 tablet by mouth every 6 (six) hours as needed for moderate pain. 120 tablet 0  . lamoTRIgine (LAMICTAL) 25 MG tablet Take 3 tablets (75 mg total) by mouth daily. 90 tablet 2  . methotrexate (RHEUMATREX) 2.5 MG tablet TAKE 4 TABLETS (10 MG TOTAL) BY MOUTH EVERY 7 DAYS.  0  . oxyCODONE-acetaminophen (PERCOCET/ROXICET) 5-325 MG tablet Take 1 tablet by mouth at bedtime as needed. Pain.  0  . Quinacrine HCl POWD 100 mg by Misc.(Non-Drug; Combo Route) route daily.    Marland Kitchen triamcinolone ointment (KENALOG) 0.5 % Apply 1 application topically 2 (two) times daily. 30  g 0   No current facility-administered medications for this visit.      Musculoskeletal: Strength & Muscle Tone: within normal limits Gait & Station: normal Patient leans: N/A  Psychiatric Specialty Exam: Review of Systems  Musculoskeletal: Positive for joint pain.    Blood pressure 122/74, height 5\' 4"  (1.626 m), weight 150 lb (68 kg).Body mass index is 25.75 kg/m.  General Appearance: Casual  Eye Contact:  Good  Speech:  Clear and Coherent  Volume:  Normal  Mood:  Anxious  Affect:  Appropriate  Thought Process:  Goal Directed  Orientation:  Full (Time, Place, and Person)  Thought Content: Logical   Suicidal Thoughts:  No  Homicidal Thoughts:  No  Memory:  Immediate;   Good Recent;   Good Remote;   Good  Judgement:  Good  Insight:  Good  Psychomotor Activity:  Normal  Concentration:  Concentration: Good and Attention Span: Good  Recall:  Good  Fund of Knowledge: Good  Language: Good  Akathisia:  No  Handed:  Right  AIMS (if indicated): not done  Assets:  Communication Skills Desire for Improvement Resilience  ADL's:  Intact  Cognition: WNL  Sleep:  Good   Screenings: PHQ2-9     Office Visit from 01/08/2018 in [place] from 12/05/2017 in [place] Office Visit from 11/21/2017 in [place] Office Visit from 06/30/2017 in [place] Office Visit from 05/19/2017 in [place]  PHQ-2 Total Score  0  0  0  0  0       Assessment and Plan: Bipolar disorder type I.  Generalized anxiety disorder.  Patient is a stable on her current medication.  Continue Lamictal 75 mg daily  and Xanax 0.5 mg as needed for anxiety.  Patient has no rash, itching or shakes.  Recommended to call us back if she has any question or any concern.  Patient is not interested in counseling.  Follow-up in 3 months.   [name], MD 07/09/2018, 11:10 AM

## 2018-07-13 ENCOUNTER — Ambulatory Visit (HOSPITAL_COMMUNITY): Payer: Self-pay | Admitting: Psychiatry

## 2018-08-03 DIAGNOSIS — G894 Chronic pain syndrome: Secondary | ICD-10-CM | POA: Diagnosis not present

## 2018-08-03 DIAGNOSIS — Z9689 Presence of other specified functional implants: Secondary | ICD-10-CM | POA: Diagnosis not present

## 2018-08-03 DIAGNOSIS — Z4542 Encounter for adjustment and management of neuropacemaker (brain) (peripheral nerve) (spinal cord): Secondary | ICD-10-CM | POA: Diagnosis not present

## 2018-08-03 DIAGNOSIS — M961 Postlaminectomy syndrome, not elsewhere classified: Secondary | ICD-10-CM | POA: Diagnosis not present

## 2018-08-07 DIAGNOSIS — M545 Low back pain: Secondary | ICD-10-CM | POA: Diagnosis not present

## 2018-08-10 DIAGNOSIS — M47816 Spondylosis without myelopathy or radiculopathy, lumbar region: Secondary | ICD-10-CM | POA: Diagnosis not present

## 2018-08-10 DIAGNOSIS — M545 Low back pain: Secondary | ICD-10-CM | POA: Diagnosis not present

## 2018-08-28 DIAGNOSIS — M545 Low back pain: Secondary | ICD-10-CM | POA: Diagnosis not present

## 2018-08-28 DIAGNOSIS — Z6825 Body mass index (BMI) 25.0-25.9, adult: Secondary | ICD-10-CM | POA: Diagnosis not present

## 2018-08-30 ENCOUNTER — Other Ambulatory Visit: Payer: Self-pay | Admitting: Orthopedic Surgery

## 2018-08-30 DIAGNOSIS — T84296A Other mechanical complication of internal fixation device of vertebrae, initial encounter: Secondary | ICD-10-CM

## 2018-08-31 ENCOUNTER — Ambulatory Visit
Admission: RE | Admit: 2018-08-31 | Discharge: 2018-08-31 | Disposition: A | Discharge status: Home / Self Care | Payer: Self-pay | Source: Ambulatory Visit | Attending: Orthopedic Surgery | Admitting: Orthopedic Surgery

## 2018-08-31 DIAGNOSIS — M47816 Spondylosis without myelopathy or radiculopathy, lumbar region: Secondary | ICD-10-CM | POA: Diagnosis not present

## 2018-08-31 DIAGNOSIS — R200 Anesthesia of skin: Secondary | ICD-10-CM | POA: Diagnosis not present

## 2018-08-31 DIAGNOSIS — T84296A Other mechanical complication of internal fixation device of vertebrae, initial encounter: Secondary | ICD-10-CM

## 2018-09-07 DIAGNOSIS — T84296A Other mechanical complication of internal fixation device of vertebrae, initial encounter: Secondary | ICD-10-CM | POA: Diagnosis not present

## 2018-09-07 DIAGNOSIS — T85698A Other mechanical complication of other specified internal prosthetic devices, implants and grafts, initial encounter: Secondary | ICD-10-CM | POA: Diagnosis not present

## 2018-09-25 DIAGNOSIS — T85698A Other mechanical complication of other specified internal prosthetic devices, implants and grafts, initial encounter: Secondary | ICD-10-CM | POA: Diagnosis not present

## 2018-09-25 DIAGNOSIS — T84296A Other mechanical complication of internal fixation device of vertebrae, initial encounter: Secondary | ICD-10-CM | POA: Diagnosis not present

## 2018-09-26 DIAGNOSIS — M359 Systemic involvement of connective tissue, unspecified: Secondary | ICD-10-CM | POA: Diagnosis not present

## 2018-09-26 DIAGNOSIS — D8989 Other specified disorders involving the immune mechanism, not elsewhere classified: Secondary | ICD-10-CM | POA: Diagnosis not present

## 2018-09-28 DIAGNOSIS — Z01810 Encounter for preprocedural cardiovascular examination: Secondary | ICD-10-CM | POA: Diagnosis not present

## 2018-10-01 DIAGNOSIS — I7300 Raynaud's syndrome without gangrene: Secondary | ICD-10-CM | POA: Diagnosis not present

## 2018-10-01 DIAGNOSIS — Z01810 Encounter for preprocedural cardiovascular examination: Secondary | ICD-10-CM | POA: Diagnosis not present

## 2018-10-03 DIAGNOSIS — T85840A Pain due to nervous system prosthetic devices, implants and grafts, initial encounter: Secondary | ICD-10-CM | POA: Diagnosis not present

## 2018-10-03 DIAGNOSIS — T85192A Other mechanical complication of implanted electronic neurostimulator (electrode) of spinal cord, initial encounter: Secondary | ICD-10-CM | POA: Diagnosis not present

## 2018-10-03 DIAGNOSIS — T85698A Other mechanical complication of other specified internal prosthetic devices, implants and grafts, initial encounter: Secondary | ICD-10-CM | POA: Diagnosis not present

## 2018-10-03 DIAGNOSIS — Z969 Presence of functional implant, unspecified: Secondary | ICD-10-CM | POA: Diagnosis not present

## 2018-10-03 DIAGNOSIS — T84296A Other mechanical complication of internal fixation device of vertebrae, initial encounter: Secondary | ICD-10-CM | POA: Diagnosis not present

## 2018-10-08 ENCOUNTER — Encounter (HOSPITAL_COMMUNITY): Payer: Self-pay | Admitting: Psychiatry

## 2018-10-08 ENCOUNTER — Ambulatory Visit (INDEPENDENT_AMBULATORY_CARE_PROVIDER_SITE_OTHER): Payer: 59 | Admitting: Psychiatry

## 2018-10-08 VITALS — BP 118/71 | HR 74 | Ht 64.0 in | Wt 149.0 lb

## 2018-10-08 DIAGNOSIS — F3131 Bipolar disorder, current episode depressed, mild: Secondary | ICD-10-CM

## 2018-10-08 DIAGNOSIS — F411 Generalized anxiety disorder: Secondary | ICD-10-CM

## 2018-10-08 MED ORDER — LAMOTRIGINE 25 MG PO TABS: 75.0000 mg | ORAL_TABLET | Freq: Every day | ORAL | 2 refills | Status: DC

## 2018-10-08 MED ORDER — ALPRAZOLAM 0.5 MG PO TABS: ORAL_TABLET | ORAL | 2 refills | Status: DC

## 2018-10-08 NOTE — Progress Notes (Signed)
BH MD/PA/NP OP Progress Note  10/08/2018 1:14 PM Maureen [name]  MRN:  [id number]  Chief Complaint: I have a lot of back pain.  I had surgery last Wednesday and I still recovering from it.  HPI: Maureen [name] came for her follow-up appointment.  She had surgery last Wednesday.  Her spinal stimulator was taken out because of battery issues.  She had a hard time in surgery and she reported that she has complications.  She still have difficulty walking and sitting on one place for long time.  However she has to go back to work this Friday.  She is stressed about work.  1 of the coworker left and she is doing 2 person job.  She is looking actively defiant and other place in the same company.  She works for [employer]  She admitted sometimes poor sleep because of pain.  However she is very resistant and reluctant to change her medication.  She is taking Xanax which is helping her sleep and anxiety.  Rarely she takes second dose if needed.  She is also concerned about her ex-husband who quit his job and drinking heavily and she is trying to help him financially to pay the utility bills.  Patient excited to have her third grand baby in October.  She denies any crying spells, suicidal thoughts or homicidal thought.  Her energy level is fair.  She denies drinking or using any illegal substances.  She denies any panic attack.  Visit Diagnosis:    ICD-10-CM   1. GAD (generalized anxiety disorder) F41.1   2. Bipolar affective disorder, currently depressed, mild (HCC) F31.31 lamoTRIgine (LAMICTAL) 25 MG tablet    ALPRAZolam (XANAX) 0.5 MG tablet    Past Psychiatric History: Reviewed. History of drug addiction, depression and bipolar disorder in her early 34s. History of overdose on pill and hospitalized at age 12.  She had another brief ER visit few years later when threatened to kill herself. History of severe mood swing, mania impulsive behavior.  Antidepressant cause mania.  Tried lithium, Prozac, Zyprexa,  Abilify and increase dose of Lamictal with limited response. Wetried [place] but developed manic symptoms.[facility].    Past Medical History:  Past Medical History:  Diagnosis Date  . Abdominal pain   . Anxiety    takes Xanax daily as needed  . Arthritis   . Cancer ([place])    MOLE PRE  . Cerebrospinal fluid leak from spinal puncture 08/24/2012  . Chronic back pain   . Constipation    takes Dulcolax and Miralax daily as needed;also Fiber Pills  . Depression    takes Lamictal daily  . Fibromyalgia   . GERD (gastroesophageal reflux disease)    takes Omeprazole daily  . Headache(784.0)   . Heart murmur    slight  . History of bronchitis    last time many yrs ago(43yrs ago)  . Internal hemorrhoid   . Interstitial cystitis    no problems since beginning of Jan 2014  . Joint pain   . Joint swelling   . Lupus ([place])    takes Plaquenil daily  . Muscle spasm    takes Robaxin daily as needed  . Numbness and tingling in hands   . Osteoarthritis   . Pneumonia    hx of > 53yrs ago  . Raynaud's disease   . Urinary frequency   . Urinary urgency     Past Surgical History:  Procedure Laterality Date  .  ABDOMINAL HERNIA REPAIR    . APPENDECTOMY    . CESAREAN SECTION    . CHOLECYSTECTOMY    . COLONOSCOPY    . DIAGNOSTIC LAPAROSCOPY     adhesions  . ENDOMETRIAL ABLATION    . ESOPHAGOGASTRODUODENOSCOPY N/A 05/08/2013   Procedure: ESOPHAGOGASTRODUODENOSCOPY (EGD);  Surgeon: Rogene Houston, MD;  Location: [facility];  Service: Endoscopy;  Laterality: N/A;  315  . NASAL SEPTUM SURGERY    . OVARIAN CYST REMOVAL    . SHOULDER SURGERY Right    x 2  . SPINAL CORD STIMULATOR INSERTION N/A 06/13/2014   Procedure: LUMBAR SPINAL CORD STIMULATOR INSERTION;  Surgeon: Bonna Gains, MD;  Location: [facility];  Service: Neurosurgery;  Laterality: N/A;  . SPINAL FUSION     x 2   . TONSILLECTOMY    . TUBAL LIGATION    . uterine ablation     . VAGINAL HYSTERECTOMY  8/12    Family Psychiatric History: Viewed.  Family History:  Family History  Problem Relation Age of Onset  . Hyperlipidemia Mother   . Depression Mother   . Anxiety disorder Mother   . Ovarian cancer Maternal Aunt   . Colon cancer Maternal Uncle   . Colon cancer Paternal Aunt   . Breast cancer Maternal Grandmother   . Alzheimer's disease Maternal Grandmother     Social History:  Social History   Socioeconomic History  . Marital status: Legally Separated    Spouse name: Not on file  . Number of children: Not on file  . Years of education: Not on file  . Highest education level: Not on file  Occupational History  . Not on file  Social Needs  . Financial resource strain: Not on file  . Food insecurity:    Worry: Not on file    Inability: Not on file  . Transportation needs:    Medical: Not on file    Non-medical: Not on file  Tobacco Use  . Smoking status: Former Smoker    Packs/day: 0.50    Years: 20.00    Pack years: 10.00    Types: Cigarettes    Last attempt to quit: 06/09/2008    Years since quitting: 10.3  . Smokeless tobacco: Never Used  Substance and Sexual Activity  . Alcohol use: No    Alcohol/week: 0.0 standard drinks  . Drug use: No  . Sexual activity: Yes    Partners: Male    Birth control/protection: Surgical  Lifestyle  . Physical activity:    Days per week: Not on file    Minutes per session: Not on file  . Stress: Not on file  Relationships  . Social connections:    Talks on phone: Not on file    Gets together: Not on file    Attends religious service: Not on file    Active member of club or organization: Not on file    Attends meetings of clubs or organizations: Not on file    Relationship status: Not on file  Other Topics Concern  . Not on file  Social History Narrative  . Not on file    Allergies:  Allergies  Allergen Reactions  . Amoxicillin Rash  . Penicillins Rash    Has patient had a PCN  reaction causing immediate rash, facial/tongue/throat swelling, SOB or lightheadedness with hypotension: yes Has patient had a PCN reaction causing severe rash involving mucus membranes or skin necrosis: yes pt did experience rash but it was not  severe  Has patient had a PCN reaction that required hospitalization: no Has patient had a PCN reaction occurring within the last 10 years: no If all of the above answers are "NO", then may proceed with Cephalosporin use.   . Tegretol [Carbamazepine] Rash    Metabolic Disorder Labs: No results found for: HGBA1C, MPG No results found for: PROLACTIN No results found for: CHOL, TRIG, HDL, CHOLHDL, VLDL, LDLCALC Lab Results  Component Value Date   TSH 1.000 02/18/2015    Therapeutic Level Labs: No results found for: LITHIUM No results found for: VALPROATE No components found for:  CBMZ  Current Medications: Current Outpatient Medications  Medication Sig Dispense Refill  . ALPRAZolam (XANAX) 0.5 MG tablet Take 1 tab at bed time as 2nd as needed 35 tablet 2  . carisoprodol (SOMA) 350 MG tablet Take 350 mg by mouth 2 (two) times daily as needed for muscle spasms.   2  . gabapentin (NEURONTIN) 300 MG capsule Take 1,200 mg by mouth at bedtime.     . hydrochlorothiazide (HYDRODIURIL) 25 MG tablet TAKE 1 TABLET BY MOUTH EVERY DAY 30 tablet 5  . HYDROcodone-acetaminophen (NORCO) 7.5-325 MG tablet Take 1 tablet by mouth every 6 (six) hours as needed for moderate pain. 120 tablet 0  . lamoTRIgine (LAMICTAL) 25 MG tablet Take 3 tablets (75 mg total) by mouth daily. 90 tablet 2  . oxyCODONE-acetaminophen (PERCOCET/ROXICET) 5-325 MG tablet Take 1 tablet by mouth at bedtime as needed. Pain.  0  . Quinacrine HCl POWD 100 mg by Misc.(Non-Drug; Combo Route) route daily.    . Cholecalciferol (VITAMIN D3) 5000 units CAPS Take 1 tablet by mouth daily.    . folic acid (FOLVITE) 1 MG tablet Take 1 mg by mouth daily.  5  . triamcinolone ointment (KENALOG) 0.5 %  Apply 1 application topically 2 (two) times daily. (Patient not taking: Reported on 10/08/2018) 30 g 0   No current facility-administered medications for this visit.      Musculoskeletal: Strength & Muscle Tone: within normal limits Gait & Station: normal Patient leans: Difficulty walking because of back pain.  Psychiatric Specialty Exam: Review of Systems  Musculoskeletal: Positive for back pain and joint pain.    Blood pressure 118/71, pulse 74, height 5\' 4"  (1.626 m), weight 149 lb (67.6 kg), SpO2 98 %.Body mass index is 25.58 kg/m.  General Appearance: Casual  Eye Contact:  Good  Speech:  Clear and Coherent  Volume:  Normal  Mood:  Anxious  Affect:  Congruent  Thought Process:  Goal Directed  Orientation:  Full (Time, Place, and Person)  Thought Content: Logical   Suicidal Thoughts:  No  Homicidal Thoughts:  No  Memory:  Immediate;   Good Recent;   Good Remote;   Good  Judgement:  Good  Insight:  Good  Psychomotor Activity:  Normal  Concentration:  Concentration: Good and Attention Span: Good  Recall:  Good  Fund of Knowledge: Good  Language: Good  Akathisia:  No  Handed:  Right  AIMS (if indicated): not done  Assets:  Communication Skills Desire for Improvement Housing Resilience Talents/Skills  ADL's:  Intact  Cognition: WNL  Sleep:  Fair   Screenings: PHQ2-9     Office Visit from 01/08/2018 in [place] from 12/05/2017 in [place] Office Visit from 11/21/2017 in [place] Office Visit from 06/30/2017 in [place] Office Visit from 05/19/2017 in [place]  PHQ-2  Total Score  0  0  0  0  0       Assessment and Plan: Bipolar disorder type I.  Generalized anxiety disorder.  Reassurance given.  Patient having a hard time recovering from her back surgery.  She still has a lot of pain.  She does not want to take spinal  stimulator again since she is concerned about another surgery and procedure.  I offered therapy but patient not interested because of financial reason.  I recommended that if her employer offered free counseling then she should explore.  She agreed with the plan.  She does not want to change or increase her medication.  Continue Lamictal 75 mg daily and Xanax 0.5 mg as needed for anxiety.  I gave 5 extra tablet in case she needed a second dose.  Discussed benzodiazepine dependence tolerance and withdrawal.  Recommended to call us back if is any question or any concern.  Follow-up in 3 months.   [name], MD 10/08/2018, 1:14 PM

## 2018-10-31 DIAGNOSIS — M797 Fibromyalgia: Secondary | ICD-10-CM | POA: Diagnosis not present

## 2018-10-31 DIAGNOSIS — M359 Systemic involvement of connective tissue, unspecified: Secondary | ICD-10-CM | POA: Diagnosis not present

## 2018-10-31 DIAGNOSIS — M150 Primary generalized (osteo)arthritis: Secondary | ICD-10-CM | POA: Diagnosis not present

## 2018-11-06 ENCOUNTER — Other Ambulatory Visit: Payer: Self-pay | Admitting: Orthopedic Surgery

## 2018-11-06 DIAGNOSIS — M4312 Spondylolisthesis, cervical region: Secondary | ICD-10-CM

## 2018-11-11 ENCOUNTER — Ambulatory Visit
Admission: RE | Admit: 2018-11-11 | Discharge: 2018-11-11 | Disposition: A | Discharge status: Home / Self Care | Payer: Self-pay | Source: Ambulatory Visit | Attending: Orthopedic Surgery | Admitting: Orthopedic Surgery

## 2018-11-11 DIAGNOSIS — M4312 Spondylolisthesis, cervical region: Secondary | ICD-10-CM

## 2018-11-11 DIAGNOSIS — M542 Cervicalgia: Secondary | ICD-10-CM | POA: Diagnosis not present

## 2018-11-15 ENCOUNTER — Ambulatory Visit: Payer: 59 | Admitting: Family Medicine

## 2018-11-15 VITALS — BP 131/87 | HR 74 | Temp 99.1°F | Ht 64.0 in | Wt 148.0 lb

## 2018-11-15 DIAGNOSIS — N3001 Acute cystitis with hematuria: Secondary | ICD-10-CM | POA: Diagnosis not present

## 2018-11-15 DIAGNOSIS — R300 Dysuria: Secondary | ICD-10-CM | POA: Diagnosis not present

## 2018-11-15 LAB — URINALYSIS
Bilirubin, UA: NEGATIVE
Glucose, UA: NEGATIVE
Ketones, UA: NEGATIVE
Nitrite, UA: NEGATIVE
PH UA: 6.5 (ref 5.0–7.5)
PROTEIN UA: NEGATIVE
Specific Gravity, UA: <1.005 — ABNORMAL LOW (ref 1.005–1.030)
Urobilinogen, Ur: 0.2 mg/dL (ref 0.2–1.0)

## 2018-11-15 MED ORDER — CEPHALEXIN 500 MG PO CAPS: 500.0000 mg | ORAL_CAPSULE | Freq: Two times a day (BID) | ORAL | 0 refills | Status: AC

## 2018-11-15 MED ORDER — PHENAZOPYRIDINE HCL 100 MG PO TABS: 100.0000 mg | ORAL_TABLET | Freq: Three times a day (TID) | ORAL | 0 refills | Status: DC | PRN

## 2018-11-15 NOTE — Patient Instructions (Signed)
You have been prescribed cephalexin to take twice daily for the next week.  Have also given you a short course of Pyridium.  I would use this no longer than 2 days while you get on antibiotics.  Otherwise, it may mask any persistent symptoms.  I sent your urine for urine culture and you will be contacted with results once available.  If you develop any worsening symptoms or signs, nausea, vomiting, true fevers, gross blood in your urine seek immediate medical attention.   Urinary Tract Infection, Adult A urinary tract infection (UTI) is an infection of any part of the urinary tract. The urinary tract includes:  The kidneys.  The ureters.  The bladder.  The urethra. These organs make, store, and get rid of pee (urine) in the body. What are the causes? This is caused by germs (bacteria) in your genital area. These germs grow and cause swelling (inflammation) of your urinary tract. What increases the risk? You are more likely to develop this condition if:  You have a small, thin tube (catheter) to drain pee.  You cannot control when you pee or poop (incontinence).  You are female, and: ? You use these methods to prevent pregnancy: ? A medicine that kills sperm (spermicide). ? A device that blocks sperm (diaphragm). ? You have low levels of a female hormone (estrogen). ? You are pregnant.  You have genes that add to your risk.  You are sexually active.  You take antibiotic medicines.  You have trouble peeing because of: ? A prostate that is bigger than normal, if you are female. ? A blockage in the part of your body that drains pee from the bladder (urethra). ? A kidney stone. ? A nerve condition that affects your bladder (neurogenic bladder). ? Not getting enough to drink. ? Not peeing often enough.  You have other conditions, such as: ? Diabetes. ? A weak disease-fighting system (immune system). ? Sickle cell disease. ? Gout. ? Injury of the spine. What are the signs or  symptoms? Symptoms of this condition include:  Needing to pee right away (urgently).  Peeing often.  Peeing small amounts often.  Pain or burning when peeing.  Blood in the pee.  Pee that smells bad or not like normal.  Trouble peeing.  Pee that is cloudy.  Fluid coming from the vagina, if you are female.  Pain in the belly or lower back. Other symptoms include:  Throwing up (vomiting).  No urge to eat.  Feeling mixed up (confused).  Being tired and grouchy (irritable).  A fever.  Watery poop (diarrhea). How is this treated? This condition may be treated with:  Antibiotic medicine.  Other medicines.  Drinking enough water. Follow these instructions at home:  Medicines  Take over-the-counter and prescription medicines only as told by your doctor.  If you were prescribed an antibiotic medicine, take it as told by your doctor. Do not stop taking it even if you start to feel better. General instructions  Make sure you: ? Pee until your bladder is empty. ? Do not hold pee for a long time. ? Empty your bladder after sex. ? Wipe from front to back after pooping if you are a female. Use each tissue one time when you wipe.  Drink enough fluid to keep your pee pale yellow.  Keep all follow-up visits as told by your doctor. This is important. Contact a doctor if:  You do not get better after 1-2 days.  Your symptoms go away  and then come back. Get help right away if:  You have very bad back pain.  You have very bad pain in your lower belly.  You have a fever.  You are sick to your stomach (nauseous).  You are throwing up. Summary  A urinary tract infection (UTI) is an infection of any part of the urinary tract.  This condition is caused by germs in your genital area.  There are many risk factors for a UTI. These include having a small, thin tube to drain pee and not being able to control when you pee or poop.  Treatment includes antibiotic  medicines for germs.  Drink enough fluid to keep your pee pale yellow. This information is not intended to replace advice given to you by your health care provider. Make sure you discuss any questions you have with your health care provider. Document Released: 03/28/2008 Document Revised: 04/19/2018 Document Reviewed: 04/19/2018 Elsevier Interactive Patient Education  2019 [employer].

## 2018-11-15 NOTE — Progress Notes (Signed)
Subjective: CC: uti PCP: Sharion Balloon, FNP MVE:[facility] R Babic is a 53 y.o. female presenting to clinic today for:  1. Urinary symptoms Patient reports a several day h/o dysuria, urgency, frequency.  She reports couple month history of urinary odor but was told that she had no urinary tract infection.  She reports a history of interstitial cystitis and chronic low back pain has noticed that her low back pain seems to be worse than normal.  Denieshematuria, fevers, chills, abdominal pain, nausea, vomiting, vaginal discharge.  Patient has used nothing for symptoms.  She tolerated Keflex a few years ago without difficulty but does have a severe penicillin allergy.   ROS: Per HPI  Allergies  Allergen Reactions  . Amoxicillin Rash  . Penicillins Rash    Has patient had a PCN reaction causing immediate rash, facial/tongue/throat swelling, SOB or lightheadedness with hypotension: yes Has patient had a PCN reaction causing severe rash involving mucus membranes or skin necrosis: yes pt did experience rash but it was not severe  Has patient had a PCN reaction that required hospitalization: no Has patient had a PCN reaction occurring within the last 10 years: no If all of the above answers are "NO", then may proceed with Cephalosporin use.   . Tegretol [Carbamazepine] Rash   Past Medical History:  Diagnosis Date  . Abdominal pain   . Anxiety    takes Xanax daily as needed  . Arthritis   . Cancer ([place])    MOLE PRE  . Cerebrospinal fluid leak from spinal puncture 08/24/2012  . Chronic back pain   . Constipation    takes Dulcolax and Miralax daily as needed;also Fiber Pills  . Depression    takes Lamictal daily  . Fibromyalgia   . GERD (gastroesophageal reflux disease)    takes Omeprazole daily  . Headache(784.0)   . Heart murmur    slight  . History of bronchitis    last time many yrs ago(37yrs ago)  . Internal hemorrhoid   . Interstitial cystitis    no problems since beginning  of Jan 2014  . Joint pain   . Joint swelling   . Lupus ([place])    takes Plaquenil daily  . Muscle spasm    takes Robaxin daily as needed  . Numbness and tingling in hands   . Osteoarthritis   . Pneumonia    hx of > 52yrs ago  . Raynaud's disease   . Urinary frequency   . Urinary urgency     Current Outpatient Medications:  .  ALPRAZolam (XANAX) 0.5 MG tablet, Take 1 tab at bed time as 2nd as needed, Disp: 35 tablet, Rfl: 2 .  carisoprodol (SOMA) 350 MG tablet, Take 350 mg by mouth 2 (two) times daily as needed for muscle spasms. , Disp: , Rfl: 2 .  Cholecalciferol (VITAMIN D3) 5000 units CAPS, Take 1 tablet by mouth daily., Disp: , Rfl:  .  folic acid (FOLVITE) 1 MG tablet, Take 1 mg by mouth daily., Disp: , Rfl: 5 .  gabapentin (NEURONTIN) 300 MG capsule, Take 1,200 mg by mouth at bedtime. , Disp: , Rfl:  .  hydrochlorothiazide (HYDRODIURIL) 25 MG tablet, TAKE 1 TABLET BY MOUTH EVERY DAY, Disp: 30 tablet, Rfl: 5 .  HYDROcodone-acetaminophen (NORCO) 7.5-325 MG tablet, Take 1 tablet by mouth every 6 (six) hours as needed for moderate pain., Disp: 120 tablet, Rfl: 0 .  lamoTRIgine (LAMICTAL) 25 MG tablet, Take 3 tablets (75 mg total) by mouth daily.,  Disp: 90 tablet, Rfl: 2 .  oxyCODONE-acetaminophen (PERCOCET/ROXICET) 5-325 MG tablet, Take 1 tablet by mouth at bedtime as needed. Pain., Disp: , Rfl: 0 .  Quinacrine HCl POWD, 100 mg by Misc.(Non-Drug; Combo Route) route daily., Disp: , Rfl:  .  triamcinolone ointment (KENALOG) 0.5 %, Apply 1 application topically 2 (two) times daily. (Patient not taking: Reported on 10/08/2018), Disp: 30 g, Rfl: 0 Social History   Socioeconomic History  . Marital status: Legally Separated    Spouse name: Not on file  . Number of children: Not on file  . Years of education: Not on file  . Highest education level: Not on file  Occupational History  . Not on file  Social Needs  . Financial resource strain: Not on file  . Food insecurity:    Worry: Not  on file    Inability: Not on file  . Transportation needs:    Medical: Not on file    Non-medical: Not on file  Tobacco Use  . Smoking status: Former Smoker    Packs/day: 0.50    Years: 20.00    Pack years: 10.00    Types: Cigarettes    Last attempt to quit: 06/09/2008    Years since quitting: 10.4  . Smokeless tobacco: Never Used  Substance and Sexual Activity  . Alcohol use: No    Alcohol/week: 0.0 standard drinks  . Drug use: No  . Sexual activity: Yes    Partners: Male    Birth control/protection: Surgical  Lifestyle  . Physical activity:    Days per week: Not on file    Minutes per session: Not on file  . Stress: Not on file  Relationships  . Social connections:    Talks on phone: Not on file    Gets together: Not on file    Attends religious service: Not on file    Active member of club or organization: Not on file    Attends meetings of clubs or organizations: Not on file    Relationship status: Not on file  . Intimate partner violence:    Fear of current or ex partner: Not on file    Emotionally abused: Not on file    Physically abused: Not on file    Forced sexual activity: Not on file  Other Topics Concern  . Not on file  Social History Narrative  . Not on file   Family History  Problem Relation Age of Onset  . Hyperlipidemia Mother   . Depression Mother   . Anxiety disorder Mother   . Ovarian cancer Maternal Aunt   . Colon cancer Maternal Uncle   . Colon cancer Paternal Aunt   . Breast cancer Maternal Grandmother   . Alzheimer's disease Maternal Grandmother     Objective: Office vital signs reviewed. BP 131/87   Pulse 74   Temp 99.1 F (37.3 C) (Oral)   Ht 5\' 4"  (1.626 m)   Wt 148 lb (67.1 kg)   BMI 25.40 kg/m   Physical Examination:  General: Awake, alert, well nourished, No acute distress GU: Suprapubic tenderness to palpation present.  She also has mild CVA tenderness palpation on the right.  Assessment/ Plan: 53 y.o. female   1.  Acute cystitis with hematuria She is mildly febrile here in office and 99.1 F.  Otherwise seems to be well-appearing.  Physical exam notable for suprapubic tenderness and mild CVA tenderness to palpation.  Urinalysis with 1+ blood and trace leukocytes.  I sent this for  urine culture.  Unfortunately urine dip not available at this hour.  Empiric treatment with oral antibiotics sent.  Keflex p.o. twice daily for 7 days.  Pyridium up to 3 times daily as needed.  Home care instructions reviewed and reasons return evaluation discussed.  She will follow-up PRN. - Urinalysis - Urine Culture   Orders Placed This Encounter  Procedures  . Urine Culture  . Urinalysis   Meds ordered this encounter  Medications  . cephALEXin (KEFLEX) 500 MG capsule    Sig: Take 1 capsule (500 mg total) by mouth 2 (two) times daily for 7 days.    Dispense:  14 capsule    Refill:  0  . phenazopyridine (PYRIDIUM) 100 MG tablet    Sig: Take 1 tablet (100 mg total) by mouth 3 (three) times daily as needed for pain.    Dispense:  10 tablet    Refill:  0     Ida Milbrath Windell Moulding, DO [place] [phone]

## 2018-11-17 LAB — URINE CULTURE

## 2018-11-20 NOTE — Progress Notes (Signed)
53 y.o. [id number] Legally Separated White or Caucasian Not Hispanic or Latino female here for annual exam.  H/O hysterectomy She has a h/o IC. She was treated last week for a UTI, the culture was negative. She did have 1+ RBC. She c/o mid lower back pain. Last week she had right flank pain.  She c/o urinary frequency, urgency, voiding small amounts, dysuria. Worse part is pain in her lower back.  She had fever of 99, normally runs low. She has a script for pyridium that she hasn't picked up. She c/o worsening hot flashes, ~20 x a day. No night sweats, sleeps okay.  She tried black cohosh, didn't help.  Not sexually active. Bowel is better, no longer having constipation.     No LMP recorded. Patient has had a hysterectomy.          Sexually active: No.  The current method of family planning is status post hysterectomy.    Exercising: No.  The patient does not participate in regular exercise at present. Smoker:  Former smoker   Health Maintenance: Pap:  2012 WNL per patient History of abnormal Pap:  no MMG:  04/03/2018 Bir[place] 1 negative Colonoscopy:  08-19-14 WNL -repeat in 10 years  BMD:   Never TDaP:  Unsure, declines.  Gardasil: N/A   reports that she quit smoking about 10 years ago. Her smoking use included cigarettes. She has a 10.00 pack-year smoking history. She has never used smokeless tobacco. She reports that she does not drink alcohol or use drugs. Rare ETOH. 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 3 kids, 27, twins are 24. She lives alone.   Past Medical History:  Diagnosis Date  . Abdominal pain   . Anxiety    takes Xanax daily as needed  . Arthritis   . Cancer ([place])    MOLE PRE  . Cerebrospinal fluid leak from spinal puncture 08/24/2012  . Chronic back pain   . Constipation    takes Dulcolax and Miralax daily as needed;also Fiber Pills  . Depression    takes Lamictal daily  . Fibromyalgia   . GERD (gastroesophageal reflux disease)    takes Omeprazole daily  .  Headache(784.0)   . Heart murmur    slight  . History of bronchitis    last time many yrs ago(13yrs ago)  . Internal hemorrhoid   . Interstitial cystitis    no problems since beginning of Jan 2014  . Joint pain   . Joint swelling   . Lupus ([place])    takes Plaquenil daily  . Muscle spasm    takes Robaxin daily as needed  . Numbness and tingling in hands   . Osteoarthritis   . Pneumonia    hx of > 67yrs ago  . Raynaud's disease   . Urinary frequency   . Urinary urgency     Past Surgical History:  Procedure Laterality Date  . ABDOMINAL HERNIA REPAIR    . APPENDECTOMY    . CESAREAN SECTION    . CHOLECYSTECTOMY    . COLONOSCOPY    . DIAGNOSTIC LAPAROSCOPY     adhesions  . ENDOMETRIAL ABLATION    . ESOPHAGOGASTRODUODENOSCOPY N/A 05/08/2013   Procedure: ESOPHAGOGASTRODUODENOSCOPY (EGD);  Surgeon: Rogene Houston, MD;  Location: [facility];  Service: Endoscopy;  Laterality: N/A;  315  . NASAL SEPTUM SURGERY    . OVARIAN CYST REMOVAL    . SHOULDER SURGERY Right    x 2  . SPINAL CORD STIMULATOR INSERTION  N/A 06/13/2014   Procedure: LUMBAR SPINAL CORD STIMULATOR INSERTION;  Surgeon: Bonna Gains, MD;  Location: [facility];  Service: Neurosurgery;  Laterality: N/A;  . SPINAL FUSION     x 2   . TONSILLECTOMY    . TUBAL LIGATION    . uterine ablation    . VAGINAL HYSTERECTOMY  8/12    Current Outpatient Medications  Medication Sig Dispense Refill  . ALPRAZolam (XANAX) 0.5 MG tablet Take 1 tab at bed time as 2nd as needed 35 tablet 2  . carisoprodol (SOMA) 350 MG tablet Take 350 mg by mouth 2 (two) times daily as needed for muscle spasms.   2  . cephALEXin (KEFLEX) 500 MG capsule Take 1 capsule (500 mg total) by mouth 2 (two) times daily for 7 days. 14 capsule 0  . Cholecalciferol (VITAMIN D3) 5000 units CAPS Take 1 tablet by mouth daily.    Marland Kitchen gabapentin (NEURONTIN) 300 MG capsule Take 1,200 mg by mouth at bedtime.     . hydrochlorothiazide (HYDRODIURIL) 25 MG tablet TAKE 1  TABLET BY MOUTH EVERY DAY 30 tablet 5  . HYDROcodone-acetaminophen (NORCO) 7.5-325 MG tablet Take 1 tablet by mouth every 6 (six) hours as needed for moderate pain. 120 tablet 0  . lamoTRIgine (LAMICTAL) 25 MG tablet Take 3 tablets (75 mg total) by mouth daily. 90 tablet 2  . oxyCODONE-acetaminophen (PERCOCET/ROXICET) 5-325 MG tablet Take 1 tablet by mouth at bedtime as needed. Pain.  0  . phenazopyridine (PYRIDIUM) 100 MG tablet Take 1 tablet (100 mg total) by mouth 3 (three) times daily as needed for pain. 10 tablet 0  . Quinacrine HCl POWD 100 mg by Misc.(Non-Drug; Combo Route) route daily.    Marland Kitchen triamcinolone ointment (KENALOG) 0.5 % Apply 1 application topically 2 (two) times daily. 30 g 0  . FLUoxetine (PROZAC) 10 MG capsule      No current facility-administered medications for this visit.     Family History  Problem Relation Age of Onset  . Hyperlipidemia Mother   . Depression Mother   . Anxiety disorder Mother   . Ovarian cancer Maternal Aunt   . Colon cancer Maternal Uncle   . Colon cancer Paternal Aunt   . Breast cancer Maternal Grandmother   . Alzheimer's disease Maternal Grandmother     Review of Systems  Gastrointestinal: Positive for nausea.  Endocrine: Positive for heat intolerance.  Genitourinary: Positive for dysuria, frequency, hematuria and urgency.       Breast pain    Musculoskeletal: Positive for arthralgias and myalgias.       Swelling   Skin: Positive for rash.       Hair loss  Psychiatric/Behavioral: Positive for dysphoric mood.  All other systems reviewed and are negative.   Exam:   BP 124/88 (BP Location: Right Arm, Patient Position: Sitting, Cuff Size: Normal)   Pulse 78   Temp 98.1 F (36.7 C) (Oral)   Resp 14   Ht 5' 3.75" (1.619 m)   Wt 147 lb 1.6 oz (66.7 kg)   BMI 25.45 kg/m   Weight change: @WEIGHTCHANGE @ Height:   Height: 5' 3.75" (161.9 cm)  Ht Readings from Last 3 Encounters:  11/21/18 5' 3.75" (1.619 m)  11/15/18 5\' 4"  (1.626 m)   05/15/18 5' 3.5" (1.613 m)    General appearance: alert, cooperative and appears stated age Head: Normocephalic, without obvious abnormality, atraumatic Neck: no adenopathy, supple, symmetrical, trachea midline and thyroid normal to inspection and palpation Lungs: clear to  auscultation bilaterally Cardiovascular: regular rate and rhythm Breasts: normal appearance, no masses or tenderness Abdomen: soft, non-tender; non distended,  no masses,  no organomegaly Extremities: extremities normal, atraumatic, no cyanosis or edema Skin: Skin color, texture, turgor normal. No rashes or lesions Lymph nodes: Cervical, supraclavicular, and axillary nodes normal. No abnormal inguinal nodes palpated Neurologic: Grossly normal   Pelvic: External genitalia:  no lesions              Urethra:  normal appearing urethra with no masses, tenderness or lesions              Bartholins and Skenes: normal                 Vagina: atrophic appearing vagina with normal color and discharge, no lesions              Cervix: absent               Bimanual Exam:  Uterus:  uterus absent              Adnexa: no mass, fullness, tenderness               Rectovaginal: Confirms               Anus:  normal sphincter tone, no lesions  Chaperone was present for exam.  A:  Well Woman with normal exam  Multiple medical problems  Interstitial cystitis, bad symptoms currently  Hematuria  Severe Hot flashes, not helped with over the counter medication. She is on gabapentin and not tolerating it. H/O bipolar disorder, doesn't want to take SSRI at this time. She desires ERT (h/o hysterectomy)  H/O lupus, no h/o blood clots, I'm unable to find any mention as to antiphospholipid antibodies in her chart    P:   No pap needed   Patient is weaning off of gabapentin (side effect of rage)  Doesn't want to take an SSRI  Desires ERT, discussed the risks, particularly of blood clot or breast cancer. She understands she could be at a  higher risk of clotting with the lupus, still wants to try ERT. The hotflashes aren't tolerable  Transdermal ERT 0.025 mg, will have her confirm it is okay with her Rheumatologist  F/U in one month  Mammogram and colonoscopy UTD  Will place referral to urology  Urine for ua, c&s   CC Dr Rinaldo Ratel

## 2018-11-21 ENCOUNTER — Telehealth: Payer: Self-pay

## 2018-11-21 ENCOUNTER — Ambulatory Visit (INDEPENDENT_AMBULATORY_CARE_PROVIDER_SITE_OTHER): Payer: 59 | Admitting: Obstetrics and Gynecology

## 2018-11-21 ENCOUNTER — Encounter: Payer: Self-pay | Admitting: Obstetrics and Gynecology

## 2018-11-21 ENCOUNTER — Other Ambulatory Visit: Payer: Self-pay

## 2018-11-21 VITALS — BP 124/88 | HR 78 | Temp 98.1°F | Resp 14 | Ht 63.75 in | Wt 147.1 lb

## 2018-11-21 DIAGNOSIS — N3010 Interstitial cystitis (chronic) without hematuria: Secondary | ICD-10-CM

## 2018-11-21 DIAGNOSIS — R300 Dysuria: Secondary | ICD-10-CM

## 2018-11-21 DIAGNOSIS — Z01419 Encounter for gynecological examination (general) (routine) without abnormal findings: Secondary | ICD-10-CM

## 2018-11-21 DIAGNOSIS — R350 Frequency of micturition: Secondary | ICD-10-CM | POA: Diagnosis not present

## 2018-11-21 DIAGNOSIS — R3129 Other microscopic hematuria: Secondary | ICD-10-CM

## 2018-11-21 DIAGNOSIS — R3915 Urgency of urination: Secondary | ICD-10-CM

## 2018-11-21 DIAGNOSIS — R232 Flushing: Secondary | ICD-10-CM | POA: Diagnosis not present

## 2018-11-21 LAB — POCT URINALYSIS DIPSTICK
Bilirubin, UA: NEGATIVE
GLUCOSE UA: NEGATIVE
Ketones, UA: NEGATIVE
LEUKOCYTES UA: NEGATIVE
Nitrite, UA: NEGATIVE
Protein, UA: NEGATIVE
Urobilinogen, UA: 0.2 E.U./dL
pH, UA: 5 (ref 5.0–8.0)

## 2018-11-21 MED ORDER — ESTRADIOL 0.025 MG/24HR TD PTTW: 1.0000 | MEDICATED_PATCH | TRANSDERMAL | 1 refills | Status: DC

## 2018-11-21 NOTE — Telephone Encounter (Signed)
Clean catch urine culture and micro sent. Spoke with patient who is agreeable and will speak with Rheumatology prior to starting ERT.  Routing to provider and will close encounter.

## 2018-11-21 NOTE — Patient Instructions (Signed)
Menopause and Hormone Replacement Therapy What is hormone replacement therapy?  Hormone replacement therapy (HRT) is the use of artificial (synthetic) hormones to replace hormones that your body stops producing during menopause. Menopause is the normal time of life when menstrual periods stop completely and the ovaries stop producing the female hormones estrogen and progesterone. This lack of hormones can affect your health and cause undesirable symptoms. HRT can relieve some of those symptoms. What are my options for HRT? HRT may consist of the synthetic hormones estrogen and progestin, or it may consist of only estrogen (estrogen-only therapy). You and your health care provider will decide which form of HRT is best for you. If you choose to be on HRT and you have a uterus, estrogen and progestin are usually prescribed. Estrogen-only therapy is used for women who do not have a uterus. Possible options for taking HRT include:  Pills.  Patches.  Gels.  Sprays.  Vaginal cream.  Vaginal rings.  Vaginal inserts. The amount of hormone(s) that you take and how long you take the hormone(s) varies depending on your individual health. It is important to:  Begin HRT with the lowest possible dosage.  Stop HRT as soon as your health care provider tells you to stop.  Work with your health care provider so that you feel informed and comfortable with your decisions. What are the benefits of HRT? HRT can reduce the frequency and severity of menopausal symptoms. Benefits of HRT vary depending on the menopausal symptoms that you have, the severity of your symptoms, and your overall health. HRT may help to improve the following menopausal symptoms:  Hot flashes and night sweats. These are sudden feelings of heat that spread over the face and body. The skin may turn red, like a blush. Night sweats are hot flashes that happen while you are sleeping or trying to sleep.  Bone loss (osteoporosis). The  body loses calcium more quickly after menopause, causing the bones to become weaker. This can increase the risk for bone breaks (fractures).  Vaginal dryness. The lining of the vagina can become thin and dry, which can cause pain during sexual intercourse or cause infection, burning, or itching.  Urinary tract infections.  Urinary incontinence. This is a decreased ability to control when you urinate.  Irritability.  Short-term memory problems. What are the risks of HRT? Risks of HRT vary depending on your individual health and medical history. Risks of HRT also depend on whether you receive both estrogen and progestin or you receive estrogen only.HRT may increase the risk of:  Spotting. This is when a small amount of bloodleaks from the vagina unexpectedly.  Endometrial cancer. This cancer is in the lining of the uterus (endometrium).  Breast cancer.  Increased density of breast tissue. This can make it harder to find breast cancer on a breast X-ray (mammogram).  Stroke.  Heart attack.  Blood clots.  Gallbladder disease. Risks of HRT can increase if you have any of the following conditions:  Endometrial cancer.  Liver disease.  Heart disease.  Breast cancer.  History of blood clots.  History of stroke. How should I care for myself while I am on HRT?  Take over-the-counter and prescription medicines only as told by your health care provider.  Get mammograms, pelvic exams, and medical checkups as often as told by your health care provider.  Have Pap tests done as often as told by your health care provider. A Pap test is sometimes called a Pap smear. It is a   screening test that is used to check for signs of cancer of the cervix and vagina. A Pap test can also identify the presence of infection or precancerous changes. Pap tests may be done: ? Every 3 years, starting at age 86. ? Every 5 years, starting after age 33, in combination with testing for human papillomavirus  (HPV). ? More often or less often depending on other medical conditions you have, your age, and other risk factors.  It is your responsibility to get your Pap test results. Ask your health care provider or the department performing the test when your results will be ready.  Keep all follow-up visits as told by your health care provider. This is important. When should I seek medical care? Talk with your health care provider if:  You have any of these: ? Pain or swelling in your legs. ? Shortness of breath. ? Chest pain. ? Lumps or changes in your breasts or armpits. ? Slurred speech. ? Pain, burning, or bleeding when you urine.  You develop any of these: ? Unusual vaginal bleeding. ? Dizziness or headaches. ? Weakness or numbness in any part of your arms or legs. ? Pain in your abdomen. This information is not intended to replace advice given to you by your health care provider. Make sure you discuss any questions you have with your health care provider. Document Released: 07/09/2003 Document Revised: 05/25/2017 Document Reviewed: 04/13/2015 Elsevier Interactive Patient Education  2019 [place] AND DIET:  We recommended that you start or continue a regular exercise program for good health. Regular exercise means any activity that makes your heart beat faster and makes you sweat.  We recommend exercising at least 30 minutes per day at least 3 days a week, preferably 4 or 5.  We also recommend a diet low in fat and sugar.  Inactivity, poor dietary choices and obesity can cause diabetes, heart attack, stroke, and kidney damage, among others.    ALCOHOL AND SMOKING:  Women should limit their alcohol intake to no more than 7 drinks/beers/glasses of wine (combined, not each!) per week. Moderation of alcohol intake to this level decreases your risk of breast cancer and liver damage. And of course, no recreational drugs are part of a healthy lifestyle.  And absolutely no smoking or  even second hand smoke. Most people know smoking can cause heart and lung diseases, but did you know it also contributes to weakening of your bones? Aging of your skin?  Yellowing of your teeth and nails?  CALCIUM AND VITAMIN D:  Adequate intake of calcium and Vitamin D are recommended.  The recommendations for exact amounts of these supplements seem to change often, but generally speaking 1,200 mg of calcium (between diet and supplement) and 800 units of Vitamin D per day seems prudent. Certain women may benefit from higher intake of Vitamin D.  If you are among these women, your doctor will have told you during your visit.    PAP SMEARS:  Pap smears, to check for cervical cancer or precancers,  have traditionally been done yearly, although recent scientific advances have shown that most women can have pap smears less often.  However, every woman still should have a physical exam from her gynecologist every year. It will include a breast check, inspection of the vulva and vagina to check for abnormal growths or skin changes, a visual exam of the cervix, and then an exam to evaluate the size and shape of the uterus and ovaries.  And after 52 years of age, a rectal exam is indicated to check for rectal cancers. We will also provide age appropriate advice regarding health maintenance, like when you should have certain vaccines, screening for sexually transmitted diseases, bone density testing, colonoscopy, mammograms, etc.   MAMMOGRAMS:  All women over 22 years old should have a yearly mammogram. Many facilities now offer a "3D" mammogram, which may cost around $50 extra out of pocket. If possible,  we recommend you accept the option to have the 3D mammogram performed.  It both reduces the number of women who will be called back for extra views which then turn out to be normal, and it is better than the routine mammogram at detecting truly abnormal areas.    COLON CANCER SCREENING: Now recommend starting at  age 2. At this time colonoscopy is not covered for routine screening until 50. There are take home tests that can be done between 45-49.   COLONOSCOPY:  Colonoscopy to screen for colon cancer is recommended for all women at age 88.  We know, you hate the idea of the prep.  We agree, BUT, having colon cancer and not knowing it is worse!!  Colon cancer so often starts as a polyp that can be seen and removed at colonscopy, which can quite literally save your life!  And if your first colonoscopy is normal and you have no family history of colon cancer, most women don't have to have it again for 10 years.  Once every ten years, you can do something that may end up saving your life, right?  We will be happy to help you get it scheduled when you are ready.  Be sure to check your insurance coverage so you understand how much it will cost.  It may be covered as a preventative service at no cost, but you should check your particular policy.      Breast Self-Awareness Breast self-awareness means being familiar with how your breasts look and feel. It involves checking your breasts regularly and reporting any changes to your health care provider. Practicing breast self-awareness is important. A change in your breasts can be a sign of a serious medical problem. Being familiar with how your breasts look and feel allows you to find any problems early, when treatment is more likely to be successful. All women should practice breast self-awareness, including women who have had breast implants. How to do a breast self-exam One way to learn what is normal for your breasts and whether your breasts are changing is to do a breast self-exam. To do a breast self-exam: Look for Changes  1. Remove all the clothing above your waist. 2. Stand in front of a mirror in a room with good lighting. 3. Put your hands on your hips. 4. Push your hands firmly downward. 5. Compare your breasts in the mirror. Look for differences between  them (asymmetry), such as: ? Differences in shape. ? Differences in size. ? Puckers, dips, and bumps in one breast and not the other. 6. Look at each breast for changes in your skin, such as: ? Redness. ? Scaly areas. 7. Look for changes in your nipples, such as: ? Discharge. ? Bleeding. ? Dimpling. ? Redness. ? A change in position. Feel for Changes Carefully feel your breasts for lumps and changes. It is best to do this while lying on your back on the floor and again while sitting or standing in the shower or tub with soapy water on your skin.  Feel each breast in the following way:  Place the arm on the side of the breast you are examining above your head.  Feel your breast with the other hand.  Start in the nipple area and make  inch (2 cm) overlapping circles to feel your breast. Use the pads of your three middle fingers to do this. Apply light pressure, then medium pressure, then firm pressure. The light pressure will allow you to feel the tissue closest to the skin. The medium pressure will allow you to feel the tissue that is a little deeper. The firm pressure will allow you to feel the tissue close to the ribs.  Continue the overlapping circles, moving downward over the breast until you feel your ribs below your breast.  Move one finger-width toward the center of the body. Continue to use the  inch (2 cm) overlapping circles to feel your breast as you move slowly up toward your collarbone.  Continue the up and down exam using all three pressures until you reach your armpit.  Write Down What You Find  Write down what is normal for each breast and any changes that you find. Keep a written record with breast changes or normal findings for each breast. By writing this information down, you do not need to depend only on memory for size, tenderness, or location. Write down where you are in your menstrual cycle, if you are still menstruating. If you are having trouble noticing  differences in your breasts, do not get discouraged. With time you will become more familiar with the variations in your breasts and more comfortable with the exam. How often should I examine my breasts? Examine your breasts every month. If you are breastfeeding, the best time to examine your breasts is after a feeding or after using a breast pump. If you menstruate, the best time to examine your breasts is 5-7 days after your period is over. During your period, your breasts are lumpier, and it may be more difficult to notice changes. When should I see my health care provider? See your health care provider if you notice:  A change in shape or size of your breasts or nipples.  A change in the skin of your breast or nipples, such as a reddened or scaly area.  Unusual discharge from your nipples.  A lump or thick area that was not there before.  Pain in your breasts.  Anything that concerns you.

## 2018-11-21 NOTE — Telephone Encounter (Signed)
-----   Message from Salvadore Dom, MD sent at 11/21/2018  1:07 PM EST ----- Please send cc and make sure she checks with rheumatology prior to starting ERT

## 2018-11-22 LAB — URINALYSIS, MICROSCOPIC ONLY
Bacteria, UA: NONE SEEN
CASTS: NONE SEEN /LPF

## 2018-11-22 LAB — URINE CULTURE: ORGANISM ID, BACTERIA: NO GROWTH

## 2018-11-26 DIAGNOSIS — R030 Elevated blood-pressure reading, without diagnosis of hypertension: Secondary | ICD-10-CM | POA: Diagnosis not present

## 2018-11-26 DIAGNOSIS — M542 Cervicalgia: Secondary | ICD-10-CM | POA: Diagnosis not present

## 2018-11-26 DIAGNOSIS — M545 Low back pain: Secondary | ICD-10-CM | POA: Diagnosis not present

## 2018-12-12 DIAGNOSIS — M461 Sacroiliitis, not elsewhere classified: Secondary | ICD-10-CM | POA: Diagnosis not present

## 2018-12-19 NOTE — Progress Notes (Signed)
GYNECOLOGY  VISIT   HPI: 53 y.o.   Legally Separated White or Caucasian Not Hispanic or Latino  female   [phone] with No LMP recorded. Patient has had a hysterectomy.   here for 1 month follow up on transdermal ERT.  She was started on 0.025 mg estrogen patch last month. She is about 25% better. She does okay at night, on gabapentin, trying to come off of it. She has lessened her dose.  Rheumatologist was okay with her being on the ERT.  She would like to go up to the 0.05 mg patch.  She was sexually active a year ago, it was painful. She would like to be active again (no current partner).  No side effects from the patch.     GYNECOLOGIC HISTORY: No LMP recorded. Patient has had a hysterectomy. Contraception: Hysterectomy  Menopausal hormone therapy: Estrogen patch        OB History    Gravida  2   Para  2   Term  1   Preterm  1   AB      Living  3     SAB      TAB      Ectopic      Multiple  1   Live Births  3              Patient Active Problem List   Diagnosis Date Noted  . Greater trochanteric bursitis of right hip 08/20/2015  . Fibrositis 01/29/2014  . Fibromyalgia 01/29/2014  . Atypical chest pain 09/02/2013  . Polypharmacy 05/01/2013  . Other long term (current) drug therapy 05/01/2013  . Arthritis 04/16/2013  . Abdominal pain, epigastric 03/26/2013  . Back pain, chronic 12/05/2012  . Connective tissue disease, undifferentiated ([place]) 12/05/2012  . Raynaud's syndrome without gangrene 12/05/2012  . Systemic lupus erythematosus ([place]) 12/05/2012  . Cerebrospinal fluid leak from spinal puncture 08/24/2012  . UNSPECIFIED DISORDER OF STOMACH AND DUODENUM 08/26/2008    Past Medical History:  Diagnosis Date  . Abdominal pain   . Anxiety    takes Xanax daily as needed  . Arthritis   . Cancer ([place])    MOLE PRE  . Cerebrospinal fluid leak from spinal puncture 08/24/2012  . Chronic back pain   . Constipation    takes Dulcolax and Miralax daily as  needed;also Fiber Pills  . Depression    takes Lamictal daily  . Fibromyalgia   . GERD (gastroesophageal reflux disease)    takes Omeprazole daily  . Headache(784.0)   . Heart murmur    slight  . History of bronchitis    last time many yrs ago(23yrs ago)  . Internal hemorrhoid   . Interstitial cystitis    no problems since beginning of Jan 2014  . Joint pain   . Joint swelling   . Lupus ([place])    takes Plaquenil daily  . Muscle spasm    takes Robaxin daily as needed  . Numbness and tingling in hands   . Osteoarthritis   . Pneumonia    hx of > 34yrs ago  . Raynaud's disease   . Urinary frequency   . Urinary urgency     Past Surgical History:  Procedure Laterality Date  . ABDOMINAL HERNIA REPAIR    . APPENDECTOMY    . CESAREAN SECTION    . CHOLECYSTECTOMY    . COLONOSCOPY    . DIAGNOSTIC LAPAROSCOPY     adhesions  . ENDOMETRIAL ABLATION    .  ESOPHAGOGASTRODUODENOSCOPY N/A 05/08/2013   Procedure: ESOPHAGOGASTRODUODENOSCOPY (EGD);  Surgeon: Rogene Houston, MD;  Location: [facility];  Service: Endoscopy;  Laterality: N/A;  315  . NASAL SEPTUM SURGERY    . OVARIAN CYST REMOVAL    . SHOULDER SURGERY Right    x 2  . SPINAL CORD STIMULATOR INSERTION N/A 06/13/2014   Procedure: LUMBAR SPINAL CORD STIMULATOR INSERTION;  Surgeon: Bonna Gains, MD;  Location: [facility];  Service: Neurosurgery;  Laterality: N/A;  . SPINAL FUSION     x 2   . TONSILLECTOMY    . TUBAL LIGATION    . uterine ablation    . VAGINAL HYSTERECTOMY  8/12    Current Outpatient Medications  Medication Sig Dispense Refill  . ALPRAZolam (XANAX) 0.5 MG tablet Take 1 tab at bed time as 2nd as needed 35 tablet 2  . carisoprodol (SOMA) 350 MG tablet Take 350 mg by mouth 2 (two) times daily as needed for muscle spasms.   2  . Cholecalciferol (VITAMIN D3) 5000 units CAPS Take 1 tablet by mouth daily.    Marland Kitchen estradiol (VIVELLE-DOT) 0.025 MG/24HR Place 1 patch onto the skin 2 (two) times a week. 8 patch 1   . gabapentin (NEURONTIN) 300 MG capsule Take 1,200 mg by mouth at bedtime.     . hydrochlorothiazide (HYDRODIURIL) 25 MG tablet TAKE 1 TABLET BY MOUTH EVERY DAY 30 tablet 5  . HYDROcodone-acetaminophen (NORCO) 7.5-325 MG tablet Take 1 tablet by mouth every 6 (six) hours as needed for moderate pain. 120 tablet 0  . lamoTRIgine (LAMICTAL) 25 MG tablet Take 3 tablets (75 mg total) by mouth daily. 90 tablet 2  . oxyCODONE-acetaminophen (PERCOCET/ROXICET) 5-325 MG tablet Take 1 tablet by mouth at bedtime as needed. Pain.  0  . Quinacrine HCl POWD 100 mg by Misc.(Non-Drug; Combo Route) route daily.    . TURMERIC PO Take by mouth daily.    Marland Kitchen FLUoxetine (PROZAC) 10 MG capsule      No current facility-administered medications for this visit.      ALLERGIES: Amoxicillin; Penicillins; and Tegretol [carbamazepine]  Family History  Problem Relation Age of Onset  . Hyperlipidemia Mother   . Depression Mother   . Anxiety disorder Mother   . Ovarian cancer Maternal Aunt   . Colon cancer Maternal Uncle   . Colon cancer Paternal Aunt   . Breast cancer Maternal Grandmother   . Alzheimer's disease Maternal Grandmother     Social History   Socioeconomic History  . Marital status: Legally Separated    Spouse name: Not on file  . Number of children: Not on file  . Years of education: Not on file  . Highest education level: Not on file  Occupational History  . Not on file  Social Needs  . Financial resource strain: Not on file  . Food insecurity:    Worry: Not on file    Inability: Not on file  . Transportation needs:    Medical: Not on file    Non-medical: Not on file  Tobacco Use  . Smoking status: Former Smoker    Packs/day: 0.50    Years: 20.00    Pack years: 10.00    Types: Cigarettes    Last attempt to quit: 06/09/2008    Years since quitting: 10.5  . Smokeless tobacco: Never Used  Substance and Sexual Activity  . Alcohol use: No    Alcohol/week: 0.0 standard drinks  . Drug  use: No  . Sexual  activity: Not Currently    Partners: Male    Birth control/protection: Surgical  Lifestyle  . Physical activity:    Days per week: Not on file    Minutes per session: Not on file  . Stress: Not on file  Relationships  . Social connections:    Talks on phone: Not on file    Gets together: Not on file    Attends religious service: Not on file    Active member of club or organization: Not on file    Attends meetings of clubs or organizations: Not on file    Relationship status: Not on file  . Intimate partner violence:    Fear of current or ex partner: Not on file    Emotionally abused: Not on file    Physically abused: Not on file    Forced sexual activity: Not on file  Other Topics Concern  . Not on file  Social History Narrative  . Not on file    Review of Systems  Constitutional:       Hot flashes  HENT: Negative.   Eyes: Negative.   Respiratory: Negative.   Cardiovascular: Negative.   Gastrointestinal: Negative.   Genitourinary: Positive for frequency and urgency.  Skin: Positive for rash.       Hair loss  Neurological: Negative.   Endo/Heme/Allergies:       Heat intolerance  Psychiatric/Behavioral: Positive for depression.    PHYSICAL EXAMINATION:    BP 122/82 (BP Location: Right Arm, Patient Position: Sitting, Cuff Size: Normal)   Pulse 72   Wt 151 lb 12.8 oz (68.9 kg)   BMI 26.26 kg/m     General appearance: alert, cooperative and appears stated age  ASSESSMENT Vasomotor symptoms, some improvement with the 0.025 mg patch H/O vaginal atrophy, not currently sexually active.    PLAN She would like to go up to the 0.05 mg patch (declines the 0.375 mg patch). She understands the risk She will call if she wants vaginal estrogen. We discussed that she may not need the vaginal estrogen on the estrogen patch    An After Visit Summary was printed and given to the patient.  ~15 minutes face to face time of which over 50% was spent in  counseling.

## 2018-12-20 ENCOUNTER — Other Ambulatory Visit: Payer: Self-pay

## 2018-12-20 ENCOUNTER — Ambulatory Visit: Payer: 59 | Admitting: Obstetrics and Gynecology

## 2018-12-20 ENCOUNTER — Encounter: Payer: Self-pay | Admitting: Obstetrics and Gynecology

## 2018-12-20 VITALS — BP 122/82 | HR 72 | Wt 151.8 lb

## 2018-12-20 DIAGNOSIS — N951 Menopausal and female climacteric states: Secondary | ICD-10-CM | POA: Diagnosis not present

## 2018-12-20 DIAGNOSIS — Z79890 Hormone replacement therapy (postmenopausal): Secondary | ICD-10-CM

## 2018-12-20 DIAGNOSIS — N952 Postmenopausal atrophic vaginitis: Secondary | ICD-10-CM

## 2018-12-20 MED ORDER — ESTRADIOL 0.05 MG/24HR TD PTTW: 1.0000 | MEDICATED_PATCH | TRANSDERMAL | 3 refills | Status: DC

## 2018-12-31 DIAGNOSIS — N393 Stress incontinence (female) (male): Secondary | ICD-10-CM | POA: Diagnosis not present

## 2018-12-31 DIAGNOSIS — R311 Benign essential microscopic hematuria: Secondary | ICD-10-CM | POA: Diagnosis not present

## 2018-12-31 DIAGNOSIS — N3021 Other chronic cystitis with hematuria: Secondary | ICD-10-CM | POA: Diagnosis not present

## 2018-12-31 DIAGNOSIS — R102 Pelvic and perineal pain: Secondary | ICD-10-CM | POA: Diagnosis not present

## 2019-01-02 DIAGNOSIS — M461 Sacroiliitis, not elsewhere classified: Secondary | ICD-10-CM | POA: Diagnosis not present

## 2019-01-08 DIAGNOSIS — R311 Benign essential microscopic hematuria: Secondary | ICD-10-CM | POA: Diagnosis not present

## 2019-01-08 DIAGNOSIS — Z0389 Encounter for observation for other suspected diseases and conditions ruled out: Secondary | ICD-10-CM | POA: Diagnosis not present

## 2019-01-15 ENCOUNTER — Ambulatory Visit (INDEPENDENT_AMBULATORY_CARE_PROVIDER_SITE_OTHER): Payer: 59 | Admitting: Psychiatry

## 2019-01-15 ENCOUNTER — Other Ambulatory Visit: Payer: Self-pay

## 2019-01-15 ENCOUNTER — Encounter (HOSPITAL_COMMUNITY): Payer: Self-pay | Admitting: Psychiatry

## 2019-01-15 VITALS — BP 112/71 | HR 76 | Temp 97.9°F | Ht 64.0 in | Wt 154.0 lb

## 2019-01-15 DIAGNOSIS — F3131 Bipolar disorder, current episode depressed, mild: Secondary | ICD-10-CM | POA: Diagnosis not present

## 2019-01-15 DIAGNOSIS — Z79899 Other long term (current) drug therapy: Secondary | ICD-10-CM

## 2019-01-15 DIAGNOSIS — F411 Generalized anxiety disorder: Secondary | ICD-10-CM | POA: Diagnosis not present

## 2019-01-15 MED ORDER — LAMOTRIGINE 25 MG PO TABS: 75.0000 mg | ORAL_TABLET | Freq: Every day | ORAL | 2 refills | Status: DC

## 2019-01-15 MED ORDER — DIAZEPAM 2 MG PO TABS: ORAL_TABLET | ORAL | 0 refills | Status: DC

## 2019-01-15 NOTE — Progress Notes (Signed)
BH MD/PA/NP OP Progress Note  01/15/2019 2:03 PM Maureen [name]  MRN:  [id number]  Chief Complaint: Want to try Valium instead of Xanax.  I have a lot of pain and I heard that Valium does help chronic pain and muscle stiffness.  HPI: Maureen [name] came for her appointment.  She is taking her medication as prescribed.  She like to try Valium instead of Xanax as she used to take Valium for her chronic muscle spasm and since she is no longer using spinal stimulator her pain is increased.  She is stressed about pandemic coronavirus as she is taking immunosuppressants but taking extra precaution when she goes outside.  She is doing social distancing.  She is taking Lamictal and denies any rash or any shakes.  She is sleeping good.  She denies any mania or any psychosis.  She denies any suicidal thoughts or homicidal thought.  She is not drinking or using any illegal substances.  Her appetite is okay.  Energy level is fair.  She has chronic pain.  Visit Diagnosis:    ICD-10-CM   1. GAD (generalized anxiety disorder) F41.1 diazepam (VALIUM) 2 MG tablet  2. Bipolar affective disorder, currently depressed, mild (HCC) F31.31 lamoTRIgine (LAMICTAL) 25 MG tablet    diazepam (VALIUM) 2 MG tablet    Past Psychiatric History: Reviewed. H/o drug addiction, depression and bipolar disorder since 26s. H/O overdose and inpatient at age 51.  H/o brief ER visit when threatened to kill herself. H/O mood swing, mania impulsive behavior.  Antidepressant cause mania.  Tried lithium, Prozac, Zyprexa, Abilify and increase dose of Lamictal with limited response. Wetried [place] but developed manic symptoms.Saw psychiatrist at [facility] and [facility] mental [facility].    Past Medical History:  Past Medical History:  Diagnosis Date  . Abdominal pain   . Anxiety    takes Xanax daily as needed  . Arthritis   . Cancer ([place])    MOLE PRE  . Cerebrospinal fluid leak from spinal puncture 08/24/2012  . Chronic back pain   .  Constipation    takes Dulcolax and Miralax daily as needed;also Fiber Pills  . Depression    takes Lamictal daily  . Fibromyalgia   . GERD (gastroesophageal reflux disease)    takes Omeprazole daily  . Headache(784.0)   . Heart murmur    slight  . History of bronchitis    last time many yrs ago(52yrs ago)  . Internal hemorrhoid   . Interstitial cystitis    no problems since beginning of Jan 2014  . Joint pain   . Joint swelling   . Lupus ([place])    takes Plaquenil daily  . Muscle spasm    takes Robaxin daily as needed  . Numbness and tingling in hands   . Osteoarthritis   . Pneumonia    hx of > 11yrs ago  . Raynaud's disease   . Urinary frequency   . Urinary urgency     Past Surgical History:  Procedure Laterality Date  . ABDOMINAL HERNIA REPAIR    . APPENDECTOMY    . CESAREAN SECTION    . CHOLECYSTECTOMY    . COLONOSCOPY    . DIAGNOSTIC LAPAROSCOPY     adhesions  . ENDOMETRIAL ABLATION    . ESOPHAGOGASTRODUODENOSCOPY N/A 05/08/2013   Procedure: ESOPHAGOGASTRODUODENOSCOPY (EGD);  Surgeon: Rogene Houston, MD;  Location: [facility] SUITE;  Service: Endoscopy;  Laterality: N/A;  315  . NASAL SEPTUM SURGERY    . OVARIAN CYST REMOVAL    .  SHOULDER SURGERY Right    x 2  . SPINAL CORD STIMULATOR INSERTION N/A 06/13/2014   Procedure: LUMBAR SPINAL CORD STIMULATOR INSERTION;  Surgeon: Bonna Gains, MD;  Location: [facility] NEURO ORS;  Service: Neurosurgery;  Laterality: N/A;  . SPINAL FUSION     x 2   . TONSILLECTOMY    . TUBAL LIGATION    . uterine ablation    . VAGINAL HYSTERECTOMY  8/12    Family Psychiatric History: Reviewed.  Family History:  Family History  Problem Relation Age of Onset  . Hyperlipidemia Mother   . Depression Mother   . Anxiety disorder Mother   . Ovarian cancer Maternal Aunt   . Colon cancer Maternal Uncle   . Colon cancer Paternal Aunt   . Breast cancer Maternal Grandmother   . Alzheimer's disease Maternal Grandmother     Social History:   Social History   Socioeconomic History  . Marital status: Legally Separated    Spouse name: Not on file  . Number of children: Not on file  . Years of education: Not on file  . Highest education level: Not on file  Occupational History  . Not on file  Social Needs  . Financial resource strain: Not on file  . Food insecurity:    Worry: Not on file    Inability: Not on file  . Transportation needs:    Medical: Not on file    Non-medical: Not on file  Tobacco Use  . Smoking status: Former Smoker    Packs/day: 0.50    Years: 20.00    Pack years: 10.00    Types: Cigarettes    Last attempt to quit: 06/09/2008    Years since quitting: 10.6  . Smokeless tobacco: Never Used  Substance and Sexual Activity  . Alcohol use: No    Alcohol/week: 0.0 standard drinks  . Drug use: No  . Sexual activity: Not Currently    Partners: Male    Birth control/protection: Surgical  Lifestyle  . Physical activity:    Days per week: Not on file    Minutes per session: Not on file  . Stress: Not on file  Relationships  . Social connections:    Talks on phone: Not on file    Gets together: Not on file    Attends religious service: Not on file    Active member of club or organization: Not on file    Attends meetings of clubs or organizations: Not on file    Relationship status: Not on file  Other Topics Concern  . Not on file  Social History Narrative  . Not on file    Allergies:  Allergies  Allergen Reactions  . Amoxicillin Rash  . Penicillins Rash    Has patient had a PCN reaction causing immediate rash, facial/tongue/throat swelling, SOB or lightheadedness with hypotension: yes Has patient had a PCN reaction causing severe rash involving mucus membranes or skin necrosis: yes pt did experience rash but it was not severe  Has patient had a PCN reaction that required hospitalization: no Has patient had a PCN reaction occurring within the last 10 years: no If all of the above answers  are "NO", then may proceed with Cephalosporin use.   . Tegretol [Carbamazepine] Rash    Metabolic Disorder Labs: No results found for: HGBA1C, MPG No results found for: PROLACTIN No results found for: CHOL, TRIG, HDL, CHOLHDL, VLDL, LDLCALC Lab Results  Component Value Date   TSH 1.000 02/18/2015  Therapeutic Level Labs: No results found for: LITHIUM No results found for: VALPROATE No components found for:  CBMZ  Current Medications: Current Outpatient Medications  Medication Sig Dispense Refill  . ALPRAZolam (XANAX) 0.5 MG tablet Take 1 tab at bed time as 2nd as needed 35 tablet 2  . carisoprodol (SOMA) 350 MG tablet Take 350 mg by mouth 2 (two) times daily as needed for muscle spasms.   2  . Cholecalciferol (VITAMIN D3) 5000 units CAPS Take 1 tablet by mouth daily.    Marland Kitchen estradiol (VIVELLE-DOT) 0.05 MG/24HR patch Place 1 patch (0.05 mg total) onto the skin 2 (two) times a week. 24 patch 3  . FLUoxetine (PROZAC) 10 MG capsule     . gabapentin (NEURONTIN) 300 MG capsule Take 1,200 mg by mouth at bedtime.     . hydrochlorothiazide (HYDRODIURIL) 25 MG tablet TAKE 1 TABLET BY MOUTH EVERY DAY 30 tablet 5  . HYDROcodone-acetaminophen (NORCO) 7.5-325 MG tablet Take 1 tablet by mouth every 6 (six) hours as needed for moderate pain. 120 tablet 0  . lamoTRIgine (LAMICTAL) 25 MG tablet Take 3 tablets (75 mg total) by mouth daily. 90 tablet 2  . oxyCODONE-acetaminophen (PERCOCET/ROXICET) 5-325 MG tablet Take 1 tablet by mouth at bedtime as needed. Pain.  0  . Quinacrine HCl POWD 100 mg by Misc.(Non-Drug; Combo Route) route daily.    . TURMERIC PO Take by mouth daily.     No current facility-administered medications for this visit.      Musculoskeletal: Strength & Muscle Tone: within normal limits Gait & Station: normal Patient leans: N/A  Psychiatric Specialty Exam: ROS  Blood pressure 112/71, pulse 76, temperature 97.9 F (36.6 C), height 5\' 4"  (1.626 m), weight 154 lb (69.9  kg).Body mass index is 26.43 kg/m.  General Appearance: Casual  Eye Contact:  Good  Speech:  Clear and Coherent  Volume:  Normal  Mood:  Anxious  Affect:  Appropriate  Thought Process:  Goal Directed  Orientation:  Full (Time, Place, and Person)  Thought Content: Rumination   Suicidal Thoughts:  No  Homicidal Thoughts:  No  Memory:  Immediate;   Good Recent;   Good Remote;   Good  Judgement:  Good  Insight:  Good  Psychomotor Activity:  Normal  Concentration:  Concentration: Fair and Attention Span: Fair  Recall:  Good  Fund of Knowledge: Good  Language: Good  Akathisia:  No  Handed:  Right  AIMS (if indicated): not done  Assets:  Communication Skills Desire for Improvement Housing Resilience Social Support Talents/Skills  ADL's:  Intact  Cognition: WNL  Sleep:  Fair   Screenings: PHQ2-9     Office Visit from 11/15/2018 in [place] Visit from 01/08/2018 in [place] from 12/05/2017 in [place] Office Visit from 11/21/2017 in [place] Visit from 06/30/2017 in [place]  PHQ-2 Total Score  0  0  0  0  0       Assessment and Plan: Bipolar disorder type I.  Generalized anxiety disorder.  Discuss role of benzodiazepine to help chronic pain.  However she had tried Valium in the past with better outcome of her chronic pain.  I explained we can try low-dose Valium however if she needed a higher dose than she should consult pain specialist.  I will discontinue Xanax 0.5 mg and try Valium 2 mg as needed for anxiety which may also help her  chronic pain and muscle spasm.  She had tried higher dose of Lamictal that did not help her.  She like to continue on Lamictal 75 mg daily.  She is not interested in therapy.  Recommended to call us back if is any question or any concern.  She is doing social distancing due to taking  immunosuppressant as trying to avoid to get infected with pandemic coronavirus.  Recommended to call us back if she is any question or any concern.  Discussed benzodiazepine dependence tolerance withdrawal and interaction with other pain medication.  I will see her again in 3 months.   [name], MD 01/15/2019, 2:03 PM

## 2019-01-18 ENCOUNTER — Encounter: Payer: Self-pay | Admitting: Family

## 2019-01-18 ENCOUNTER — Other Ambulatory Visit: Payer: Self-pay

## 2019-01-18 ENCOUNTER — Ambulatory Visit (INDEPENDENT_AMBULATORY_CARE_PROVIDER_SITE_OTHER): Payer: 59 | Admitting: Family

## 2019-01-18 DIAGNOSIS — J301 Allergic rhinitis due to pollen: Secondary | ICD-10-CM | POA: Diagnosis not present

## 2019-01-18 MED ORDER — CETIRIZINE HCL 10 MG PO TABS: 10.0000 mg | ORAL_TABLET | Freq: Every day | ORAL | 11 refills | Status: DC

## 2019-01-18 MED ORDER — FLUTICASONE PROPIONATE 50 MCG/ACT NA SUSP: 2.0000 | Freq: Every day | NASAL | 6 refills | Status: DC

## 2019-01-18 NOTE — Progress Notes (Signed)
   Virtual Visit via telephone Note  I connected with Maureen [name] on 01/18/19 at 12:59pm  by telephone and verified that I am speaking with the correct person using two identifiers. Maureen [name] is currently located at home and no one is currently with her during visit. The provider, Evelina Dun, FNP is located in their office at time of visit.  I discussed the limitations, risks, security and privacy concerns of performing an evaluation and management service by telephone and the availability of in person appointments. I also discussed with the patient that there may be a patient responsible charge related to this service. The patient expressed understanding and agreed to proceed.   History and Present Illness:   HPI PT complaints of sneezing, rhinorrhea, post-nasal drainage, and slight sore throat that started 01/14/19 that is gradually worsen. She states her sore throat pain has resolved, but continues to sneeze.  She denies any cough, fever,  SOB, chest tightness, or body aches.   She states she started taking Claritin daily. States she went to work this morning and was sneezing and her work sent her home. She states she had a slight panic attack then thinking she might have symptoms of COVID-19, but now feels better. Since hr Claritin "kicked in" she states her symptoms are much improved.    Review of Systems  All other systems reviewed and are negative.      Observations/Objective: No SOB or distress noted on phone call.   Assessment and Plan: 1. Allergic rhinitis due to pollen, unspecified seasonality Avoid allergens Start flonase every night and zyrtec or Claritin  in the AM Symptoms do not sound like COVID-19 symptoms, No cough or SOB heard on phone call Note given to return to work on Monday RTO if symptoms worsen or do not improve  - cetirizine (ZYRTEC) 10 MG tablet; Take 1 tablet (10 mg total) by mouth daily.  Dispense: 30 tablet; Refill: 11 - fluticasone  (FLONASE) 50 MCG/ACT nasal spray; Place 2 sprays into both nostrils daily.  Dispense: 16 g; Refill: 6       I discussed the assessment and treatment plan with the patient. The patient was provided an opportunity to ask questions and all were answered. The patient agreed with the plan and demonstrated an understanding of the instructions.   The patient was advised to call back or seek an in-person evaluation if the symptoms worsen or if the condition fails to improve as anticipated.  The above assessment and management plan was discussed with the patient. The patient verbalized understanding of and has agreed to the management plan. Patient is aware to call the clinic if symptoms persist or worsen. Patient is aware when to return to the clinic for a follow-up visit. Patient educated on when it is appropriate to go to the emergency department.    Called ended 1:11pm, I provided 12 minutes of non-face-to-face time during this encounter.    Evelina Dun, FNP

## 2019-02-07 DIAGNOSIS — N3021 Other chronic cystitis with hematuria: Secondary | ICD-10-CM | POA: Diagnosis not present

## 2019-02-07 DIAGNOSIS — R311 Benign essential microscopic hematuria: Secondary | ICD-10-CM | POA: Diagnosis not present

## 2019-02-11 ENCOUNTER — Telehealth (HOSPITAL_COMMUNITY): Payer: Self-pay

## 2019-02-11 DIAGNOSIS — F411 Generalized anxiety disorder: Secondary | ICD-10-CM

## 2019-02-11 MED ORDER — ALPRAZOLAM 0.5 MG PO TABS: ORAL_TABLET | ORAL | 1 refills | Status: DC

## 2019-02-11 NOTE — Telephone Encounter (Signed)
Call return and spoke to patient.  She endorsed having constipation and not able to sleep well with Valium and like to go back to Xanax 0.5 mg at bedtime.  She was getting 35 tablets a month as sometimes she takes during the day to help with anxiety.  We will discontinue Valium and resume Xanax.

## 2019-02-11 NOTE — Telephone Encounter (Signed)
Patient requests to be changed back to Xanax, she states the diazepam does not work as well and causes constipation. She wants to know if you can write for 35 tabs so that she can occasionally take one during the day. Please review and advise, thank you

## 2019-03-07 DIAGNOSIS — M4312 Spondylolisthesis, cervical region: Secondary | ICD-10-CM | POA: Diagnosis not present

## 2019-03-07 DIAGNOSIS — M961 Postlaminectomy syndrome, not elsewhere classified: Secondary | ICD-10-CM | POA: Diagnosis not present

## 2019-03-25 ENCOUNTER — Ambulatory Visit (INDEPENDENT_AMBULATORY_CARE_PROVIDER_SITE_OTHER): Payer: 59

## 2019-03-25 ENCOUNTER — Other Ambulatory Visit: Payer: Self-pay

## 2019-03-25 ENCOUNTER — Encounter: Payer: Self-pay | Admitting: Family

## 2019-03-25 ENCOUNTER — Ambulatory Visit (INDEPENDENT_AMBULATORY_CARE_PROVIDER_SITE_OTHER): Payer: 59 | Admitting: Family

## 2019-03-25 VITALS — BP 105/74 | HR 69 | Temp 98.0°F | Ht 63.0 in | Wt 151.4 lb

## 2019-03-25 DIAGNOSIS — S99922A Unspecified injury of left foot, initial encounter: Secondary | ICD-10-CM | POA: Diagnosis not present

## 2019-03-25 DIAGNOSIS — S92501A Displaced unspecified fracture of right lesser toe(s), initial encounter for closed fracture: Secondary | ICD-10-CM | POA: Diagnosis not present

## 2019-03-25 MED ORDER — DICLOFENAC SODIUM 75 MG PO TBEC: 75.0000 mg | DELAYED_RELEASE_TABLET | Freq: Two times a day (BID) | ORAL | 0 refills | Status: DC

## 2019-03-25 NOTE — Patient Instructions (Signed)
Toe Fracture  A toe fracture is a break in one of the toe bones (phalanges). A toe fracture may be:  · A crack in the surface of the bone (stress fracture). This often occurs in athletes.  · A break all the way through the bone (complete fracture).  What are the causes?  This condition may be caused by:  · Direct impact, such as from dropping a heavy object on your toe.  · Stubbing your toe.  · Twisting or stretching your toe out of place.  · Overuse or repetitive exercise.  What increases the risk?  You are more likely to develop this condition if you:  · Play contact sports.  · Have a condition that causes the bones to become thin and brittle (osteoporosis).  · Have a low calcium level.  What are the signs or symptoms?  The main symptoms of this condition are swelling and pain in the toe. Other symptoms include:  · Bruising.  · Stiffness.  · Numbness.  · A change in the way the toe looks.  · Broken bones that poke through the skin.  · Blood beneath the toenail.  How is this diagnosed?  This condition is diagnosed with a physical exam. You may also have X-rays.  How is this treated?  Treatment for this condition depends on the type of fracture and its severity. Treatment may include:  · Taping the broken toe to a toe that is next to it (buddy taping). This is the most common treatment for fractures in which the bone has not moved out of place (non-displaced fracture).  · Wearing a shoe that has a wide, rigid sole to protect the toe and to limit its movement.  · Wearing a walking cast.  · Having a procedure to move the toe back into place.  · Surgery. This may be needed if the:  ? Pieces of broken bone are out of place (displaced).  ? Bone breaks through the skin.  · Physical therapy. This is done to help regain movement and strength in the toe.  You may need follow-up X-rays to make sure that the bone is healing well and staying in position.  Follow these instructions at home:  If you have a shoe:  · Wear the shoe  as told by your health care provider. Remove it only as told by your health care provider.  · Loosen the shoe if your toes tingle, become numb, or turn cold and blue.  · Keep the shoe clean and dry.  If you have a cast:  · Do not put pressure on any part of the cast until it is fully hardened. This may take several hours.  · Do not stick anything inside the cast to scratch your skin. Doing that increases your risk of infection.  · Check the skin around the cast every day. Tell your health care provider about any concerns.  · You may put lotion on dry skin around the edges of the cast. Do not put lotion on the skin underneath the cast.  · Keep the cast clean and dry.  Bathing  · Do not take baths, swim, or use a hot tub until your health care provider approves. Ask your health care provider if you can take showers.  · If the shoe or cast is not waterproof:  ? Do not let it get wet.  ? Cover it with a watertight covering when you take a bath or a shower.  Activity  ·   Do not use the injured foot to support your body weight until your health care provider says that you can. Use crutches as directed.  · Ask your health care provider:  ? What activities are safe for you during recovery.  ? What activities you need to avoid.  · Do physical therapy exercises as directed.  Driving  · Do not drive or use heavy machinery while taking pain medicine.  · Do not drive while wearing a cast on a foot that you use for driving.  Managing pain, stiffness, and swelling    · If directed, put ice on painful areas:  ? Put ice in a plastic bag.  ? Place a towel between your skin and the bag.  § If you have a shoe, remove it as told by your health care provider.  § If you have a cast, place a towel between your cast and the bag.  ? Leave the ice on for 20 minutes, 2-3 times per day.  · Raise (elevate) the injured area above the level of your heart while you are sitting or lying down.  General instructions  · If your toe was treated with  buddy taping, follow your health care provider's instructions for changing the gauze and tape. Change it more often if:  ? The gauze and tape get wet. If this happens, dry the space between the toes.  ? The gauze and tape are too tight and cause your toe to become pale or numb.  · If you were not given a protective shoe, wear sturdy, supportive shoes. Your shoes should not pinch your toes and should not fit tightly against your toes.  · Do not use any products that contain nicotine or tobacco, such as cigarettes and e-cigarettes. These can delay bone healing. If you need help quitting, ask your health care provider.  · Take over-the-counter and prescription medicines only as told by your health care provider.  · Keep all follow-up visits as told by your health care provider. This is important.  Contact a health care provider if you have:  · Pain that gets worse or does not get better with medicine.  · A fever.  · A bad smell coming from your cast.  Get help right away if you have:  · Any of the following in your toes or your foot:  ? Numbness that gets worse.  ? Tingling.  ? Coldness.  ? Blue skin.  · Redness or swelling that gets worse.  · Pain that suddenly becomes severe.  Summary   · A toe fracture is a break in one of the toe bones (phalanges).  · Treatment depends on how severe your fracture is and how the pieces of the broken bone line up with each other. Treatment may include buddy taping, wearing a shoe or a cast, or using crutches.  · Ice and elevate your foot to help lessen the pain and swelling.  This information is not intended to replace advice given to you by your health care provider. Make sure you discuss any questions you have with your health care provider.  Document Released: 10/07/2000 Document Revised: 01/23/2018 Document Reviewed: 11/21/2017  Elsevier Interactive Patient Education © 2019 [employer]

## 2019-03-25 NOTE — Progress Notes (Signed)
   Subjective:    Patient ID: Maureen [name], female    DOB: 05-05-1966, 53 y.o.   MRN: [id number]  Chief Complaint  Patient presents with  . injured toe on left foot    Foot Injury   The incident occurred 2 days ago. The injury mechanism was a direct blow. Pain location: left fifth toe. The quality of the pain is described as aching. The pain is at a severity of 7/10. The pain is moderate. The pain has been constant since onset. The symptoms are aggravated by weight bearing. She has tried rest, acetaminophen and NSAIDs for the symptoms. The treatment provided mild relief.      Review of Systems  All other systems reviewed and are negative.      Objective:   Physical Exam Vitals signs reviewed.  Constitutional:      General: She is not in acute distress.    Appearance: She is well-developed.  Neck:     Musculoskeletal: Normal range of motion and neck supple.     Thyroid: No thyromegaly.  Cardiovascular:     Rate and Rhythm: Normal rate and regular rhythm.     Heart sounds: Normal heart sounds. No murmur.  Pulmonary:     Effort: Pulmonary effort is normal. No respiratory distress.     Breath sounds: Normal breath sounds. No wheezing.  Abdominal:     General: Bowel sounds are normal. There is no distension.     Palpations: Abdomen is soft.     Tenderness: There is no abdominal tenderness.  Musculoskeletal: Normal range of motion.        General: No tenderness.  Skin:    General: Skin is warm and dry.  Neurological:     Mental Status: She is alert and oriented to person, place, and time.     Cranial Nerves: No cranial nerve deficit.     Deep Tendon Reflexes: Reflexes are normal and symmetric.  Psychiatric:        Behavior: Behavior normal.        Thought Content: Thought content normal.        Judgment: Judgment normal.     BP 105/74   Pulse 69   Temp 98 F (36.7 C) (Oral)   Ht 5\' 3"  (1.6 m)   Wt 151 lb 6.4 oz (68.7 kg)   BMI 26.82 kg/m      Assessment &  Plan:  Maureen [name] comes in today with chief complaint of injured toe on left foot   Diagnosis and orders addressed:  1. Injury of toe on left foot, initial encounter - DG Foot Complete Left; Future - diclofenac (VOLTAREN) 75 MG EC tablet; Take 1 tablet (75 mg total) by mouth 2 (two) times daily.  Dispense: 30 tablet; Refill: 0  2. Closed fracture of phalanx of right fifth toe, initial encounter - diclofenac (VOLTAREN) 75 MG EC tablet; Take 1 tablet (75 mg total) by mouth 2 (two) times daily.  Dispense: 30 tablet; Refill: 0   Toe buddy taped and boot applied Ice Rest  Continue pain medication as needed RTO in 3 weeks for repeat x-ray   Evelina Dun, FNP

## 2019-04-16 ENCOUNTER — Encounter: Payer: Self-pay | Admitting: Family

## 2019-04-16 ENCOUNTER — Ambulatory Visit: Payer: 59 | Admitting: Family

## 2019-04-17 ENCOUNTER — Other Ambulatory Visit: Payer: Self-pay

## 2019-04-17 ENCOUNTER — Ambulatory Visit (INDEPENDENT_AMBULATORY_CARE_PROVIDER_SITE_OTHER): Payer: 59 | Admitting: Psychiatry

## 2019-04-17 ENCOUNTER — Encounter (HOSPITAL_COMMUNITY): Payer: Self-pay | Admitting: Psychiatry

## 2019-04-17 DIAGNOSIS — F411 Generalized anxiety disorder: Secondary | ICD-10-CM | POA: Diagnosis not present

## 2019-04-17 DIAGNOSIS — F3131 Bipolar disorder, current episode depressed, mild: Secondary | ICD-10-CM | POA: Diagnosis not present

## 2019-04-17 MED ORDER — LAMOTRIGINE 25 MG PO TABS: 75.0000 mg | ORAL_TABLET | Freq: Every day | ORAL | 2 refills | Status: DC

## 2019-04-17 MED ORDER — ALPRAZOLAM 0.5 MG PO TABS: ORAL_TABLET | ORAL | 2 refills | Status: DC

## 2019-04-17 NOTE — Progress Notes (Signed)
Virtual Visit via Telephone Note  I connected with Maureen [name] on 04/17/19 at  1:00 PM EDT by telephone and verified that I am speaking with the correct person using two identifiers.   I discussed the limitations, risks, security and privacy concerns of performing an evaluation and management service by telephone and the availability of in person appointments. I also discussed with the patient that there may be a patient responsible charge related to this service. The patient expressed understanding and agreed to proceed.   History of Present Illness: Patient was evaluated by phone session.  On her last visit we try Valium but it causes constipation and did not felt that it was working.  She is back on Xanax 0.5 mg at bedtime and some nights she takes extra if she is anxious.  She admitted sometimes mood swings and irritability but she is not interested to change or add any medication.  She lives by herself and she feel that helps a lot.  Sometimes her children give her a stress as recently 1 of her daughter's boyfriend decided to have using drugs and currently her daughter is separated from him.  Patient concerned about her daughter because they have small kids.  She is still in touch with her ex-husband because of house and children but she feels much better since she is living by herself.  She is working at [employer] and job is stressful.  She is not drinking or using any illegal substances.  She is concerned and anxious about COVID-19 because she takes immunosuppressants.  She denies any panic attack.  She denies any mania, hallucination, crying spells or any suicidal thoughts.  She has no rash, itching or tremors.  She is not interested in therapy.  Past Psychiatric History: Reviewed. H/o drug addiction,depression and bipolar disorder since 74s. H/O overdose and inpatient at age 20.H/o brief ER visit when threatened to kill herself. H/O mood swing, maniaimpulsive  behavior. Antidepressant cause mania. Triedlithium, Prozac, Zyprexa, Abilify and increase dose of Lamictal with limited response. Wetried [place] but developed manic symptoms and Valium caused constipation.[facility]    Psychiatric Specialty Exam: Physical Exam  ROS  There were no vitals taken for this visit.There is no height or weight on file to calculate BMI.  General Appearance: NA  Eye Contact:  NA  Speech:  Clear and Coherent  Volume:  Increased  Mood:  Anxious  Affect:  NA  Thought Process:  Goal Directed  Orientation:  Full (Time, Place, and Person)  Thought Content:  Logical  Suicidal Thoughts:  No  Homicidal Thoughts:  No  Memory:  Immediate;   Good Recent;   Good Remote;   Good  Judgement:  Good  Insight:  Good  Psychomotor Activity:  NA  Concentration:  Concentration: Fair and Attention Span: Fair  Recall:  Good  Fund of Knowledge:  Good  Language:  Good  Akathisia:  No  Handed:  Right  AIMS (if indicated):     Assets:  Communication Skills Desire for Improvement Housing Resilience Talents/Skills Transportation  ADL's:  Intact  Cognition:  WNL  Sleep:   fair      Assessment and Plan: Bipolar disorder type I.  Generalized anxiety disorder.  Discuss her chronic issues but patient believe her symptoms are stable and she is not interested to change the medication.  She had tried higher dose of Lamictal but having side effects.  She is not interested in therapy.  Encouraged to keep social distancing and use mask as she takes immunosuppressants.  Reassurance given.  Continue Lamictal 75 mg daily and Xanax 0.5 mg as needed at bedtime and she can take extra some nights.  We are providing 35 tablets of Xanax.  Discussed benzodiazepine dependence tolerance and withdrawal.  Recommended to call us back if is any question or any concern.  Follow-up in 3 months.  Follow Up Instructions:    I discussed the  assessment and treatment plan with the patient. The patient was provided an opportunity to ask questions and all were answered. The patient agreed with the plan and demonstrated an understanding of the instructions.   The patient was advised to call back or seek an in-person evaluation if the symptoms worsen or if the condition fails to improve as anticipated.  I provided 15 minutes of non-face-to-face time during this encounter.   [name], MD

## 2019-04-18 ENCOUNTER — Encounter: Payer: Self-pay | Admitting: Family

## 2019-04-18 ENCOUNTER — Ambulatory Visit (INDEPENDENT_AMBULATORY_CARE_PROVIDER_SITE_OTHER): Payer: 59 | Admitting: Family

## 2019-04-18 ENCOUNTER — Ambulatory Visit (INDEPENDENT_AMBULATORY_CARE_PROVIDER_SITE_OTHER): Payer: 59

## 2019-04-18 VITALS — BP 113/75 | HR 73 | Temp 97.3°F | Ht 63.0 in | Wt 152.2 lb

## 2019-04-18 DIAGNOSIS — S92912D Unspecified fracture of left toe(s), subsequent encounter for fracture with routine healing: Secondary | ICD-10-CM

## 2019-04-18 NOTE — Patient Instructions (Signed)
Metatarsal Fracture A metatarsal fracture is a break in one of the five bones that connect the toes to the rest of the foot. This may also be called a forefoot fracture. A metatarsal fracture may be:  A crack in the surface of the bone (stress fracture). This often occurs in athletes.  A break all the way through the bone (complete fracture). The bone that connects to the little toe (fifth metatarsal) is most commonly fractured. Ballet dancers often fracture this bone. What are the causes? A metatarsal fracture may be caused by:  Sudden twisting of the foot.  Falling onto the foot.  Something heavy falling onto the foot.  Overuse or repetitive exercise. What increases the risk? This condition is more likely to develop in people who:  Play contact sports.  Do ballet.  Have a condition that causes the bones to become thin and brittle (osteoporosis).  Have a low calcium level. What are the signs or symptoms? Symptoms of this condition include:  Pain that gets worse when walking or standing.  Pain when pressing on the foot or moving the toes.  Swelling.  Bruising on the top or bottom of the foot. How is this diagnosed? This condition may be diagnosed based on:  Your symptoms.  Any recent foot injuries you have had.  A physical exam.  An X-ray of your foot. If you have a stress fracture, it may not show up on an X-ray, and you may need other imaging tests, such as: ? A bone scan. ? CT scan. ? MRI. How is this treated? Treatment depends on how severe your fracture is and how the pieces of the broken bone line up with each other (alignment). Treatment may involve:  Wearing a cast, splint, or supportive boot on your foot.  Using crutches, and not putting any weight on your foot.  Having surgery to align broken bones (open reduction and internal fixation, ORIF).  Physical therapy.  Follow-up visits and X-rays to make sure you are healing. Follow these instructions  at home: If you have a splint or a supportive boot:  Wear the splint or boot as told by your health care provider. Remove it only as told by your health care provider.  Loosen the splint or boot if your toes tingle, become numb, or turn cold and blue.  Keep the splint or boot clean.  If your splint or boot is not waterproof: ? Do not let it get wet. ? Cover it with a watertight covering when you take a bath or a shower. If you have a cast:  Do not stick anything inside the cast to scratch your skin. Doing that increases your risk for infection.  Check the skin around the cast every day. Tell your health care provider about any concerns.  You may put lotion on dry skin around the edges of the cast. Do not put lotion on the skin underneath the cast.  Keep the cast clean.  If the cast is not waterproof: ? Do not let it get wet. ? Cover it with a watertight covering when you take a bath or a shower. Activity  Do not use your affected leg to support your body weight until your health care provider says that you can. Use crutches as directed.  Ask your health care provider what activities are safe for you during recovery, and ask what activities you need to avoid.  Do physical therapy exercises as directed. Driving  Do not drive or use heavy machinery   while taking pain medicine.  Do not drive while wearing a cast, splint, or boot on a foot that you use for driving. Managing pain, stiffness, and swelling   If directed, put ice on painful areas: ? Put ice in a plastic bag. ? Place a towel between your skin and the bag.  If you have a removable splint or boot, remove it as told by your health care provider.  If you have a cast, place a towel between your cast and the bag. ? Leave the ice on for 20 minutes, 2-3 times a day.  Move your toes often to avoid stiffness and to lessen swelling.  Raise (elevate) your lower leg above the level of your heart while you are sitting or  lying down. General instructions  Do not put pressure on any part of the cast or splint until it is fully hardened. This may take several hours.  Take over-the-counter and prescription medicines only as told by your health care provider.  Do not use any products that contain nicotine or tobacco, such as cigarettes and e-cigarettes. These can delay bone healing. If you need help quitting, ask your health care provider.  Do not take baths, swim, or use a hot tub until your health care provider approves. Ask your health care provider if you may take showers.  Keep all follow-up visits as told by your health care provider. This is important. Contact a health care provider if you have:  Pain that gets worse or does not get better with medicine.  A fever.  A bad smell coming from your cast or splint. Get help right away if you have:  Any of the following in your toes or your foot, even after loosening your splint (if applicable): ? Numbness. ? Tingling. ? Coldness. ? Blue skin.  Redness or swelling that gets worse.  Pain that suddenly becomes severe. Summary  A metatarsal fracture is a break in one of the five bones that connect the toes to the rest of the foot.  Treatment depends on how severe your fracture is and how the pieces of the broken bone line up with each other (alignment). This may include wearing a cast, splint, or supportive boot, or using crutches. Sometimes surgery is needed to align the bones.  Ice and elevate your foot to help lessen the pain and swelling.  Make sure you know what symptoms should cause you to get help right away. This information is not intended to replace advice given to you by your health care provider. Make sure you discuss any questions you have with your health care provider. Document Released: 07/02/2002 Document Revised: 11/06/2017 Document Reviewed: 11/06/2017 Elsevier Interactive Patient Education  2019 Elsevier Inc[employer]  

## 2019-04-18 NOTE — Progress Notes (Signed)
Subjective:    Patient ID: Maureen [name], female    DOB: 1966/01/08, 53 y.o.   MRN: [id number]  Chief Complaint  Patient presents with  . recheck toe   Pt presents to the office today to recheck left foot. She was seen on 03/25/19 and found to have a minimally angulated fracture at shaft of proximal phalanx.   She reports she continues to have intermittent aching pain of 5 out 10. She states she "put pressure" on it, but can not wear any type of shoe.  Foot Injury  The incident occurred more than 1 week ago. The injury mechanism was a direct blow. Pain location: left foot. The pain is at a severity of 5/10. The pain is moderate. The pain has been intermittent since onset. Pertinent negatives include no numbness or tingling. She reports no foreign bodies present. The symptoms are aggravated by movement and weight bearing. She has tried non-weight bearing and rest for the symptoms. The treatment provided mild relief.      Review of Systems  Neurological: Negative for tingling and numbness.  All other systems reviewed and are negative.      Objective:   Physical Exam Vitals signs reviewed.  Constitutional:      General: She is not in acute distress.    Appearance: She is well-developed.  HENT:     Head: Normocephalic and atraumatic.     Right Ear: Tympanic membrane normal.     Left Ear: Tympanic membrane normal.  Eyes:     Pupils: Pupils are equal, round, and reactive to light.  Neck:     Musculoskeletal: Normal range of motion and neck supple.     Thyroid: No thyromegaly.  Cardiovascular:     Rate and Rhythm: Normal rate and regular rhythm.     Heart sounds: Normal heart sounds. No murmur.  Pulmonary:     Effort: Pulmonary effort is normal. No respiratory distress.     Breath sounds: Normal breath sounds. No wheezing.  Abdominal:     General: Bowel sounds are normal. There is no distension.     Palpations: Abdomen is soft.     Tenderness: There is no abdominal  tenderness.  Musculoskeletal:        General: Swelling and tenderness present.     Comments: Tenderness and mild swelling of fifth phalanx, mild ecchymosis present   Skin:    General: Skin is warm and dry.  Neurological:     Mental Status: She is alert and oriented to person, place, and time.     Cranial Nerves: No cranial nerve deficit.     Deep Tendon Reflexes: Reflexes are normal and symmetric.  Psychiatric:        Behavior: Behavior normal.        Thought Content: Thought content normal.        Judgment: Judgment normal.       BP 113/75   Pulse 73   Temp (!) 97.3 F (36.3 C) (Oral)   Ht 5\' 3"  (1.6 m)   Wt 152 lb 3.2 oz (69 kg)   BMI 26.96 kg/m      Assessment & Plan:  Maureen [name] comes in today with chief complaint of recheck toe   Diagnosis and orders addressed:  1. Closed nondisplaced fracture of phalanx of toe of left foot with routine healing, unspecified toe, subsequent encounter Will do x-ray  Rest Ice If nonhealing will send to Ortho - DG Foot Complete Left; Future  [employer]  Lenna Gilford, [place]

## 2019-07-11 ENCOUNTER — Encounter: Payer: Self-pay | Admitting: Family

## 2019-07-11 ENCOUNTER — Telehealth (INDEPENDENT_AMBULATORY_CARE_PROVIDER_SITE_OTHER): Payer: 59 | Admitting: Family

## 2019-07-11 DIAGNOSIS — H0014 Chalazion left upper eyelid: Secondary | ICD-10-CM | POA: Diagnosis not present

## 2019-07-11 MED ORDER — BACITRACIN 500 UNIT/GM OP OINT: 1.0000 "application " | TOPICAL_OINTMENT | Freq: Four times a day (QID) | OPHTHALMIC | 0 refills | Status: DC

## 2019-07-11 NOTE — Progress Notes (Signed)
   Virtual Visit via Video Note Due to COVID-19 pandemic this visit was conducted virtually. This visit type was conducted due to national recommendations for restrictions regarding the COVID-19 Pandemic (e.g. social distancing, sheltering in place) in an effort to limit this patient's exposure and mitigate transmission in our community. All issues noted in this document were discussed and addressed.  A physical exam was not performed with this format.  I connected with Maureen [name] on 07/11/19 at 4:29 pm by video  and verified that I am speaking with the correct person using two identifiers. Maureen [name] is currently located at work and no one  is currently with her during visit. The provider, Evelina Dun, FNP is located in their office at time of visit.  I discussed the limitations, risks, security and privacy concerns of performing an evaluation and management service by telephone and the availability of in person appointments. I also discussed with the patient that there may be a patient responsible charge related to this service. The patient expressed understanding and agreed to proceed.   History and Present Illness:  HPI Pt calls the office today with a stye of left upper  Eye lid that started about 7 days ago. She reports intermittent aching pain of 3 out 10. States she used warm compresses that helps  mildly with the swelling. Denies any discharge, fever, redness, or vision changes. She does report that the spot is hard and goes up her eyelid.    Review of Systems  Eyes: Positive for pain. Negative for blurred vision, double vision, photophobia, discharge and redness.  All other systems reviewed and are negative.    Observations/Objective: No SOB or distress noted   Assessment and Plan: 1. Chalazion of left upper eyelid Warm compresses Discussed that looking at the area it looks my like a cyst than bacteria. I will give her antibiotic ointment.  Do not pick or  Squeeze   RTO if symptoms worsen or do not improve - bacitracin ophthalmic ointment; Place 1 application into the left eye 4 (four) times daily. apply to eye  Dispense: 3.5 g; Refill: 0     I discussed the assessment and treatment plan with the patient. The patient was provided an opportunity to ask questions and all were answered. The patient agreed with the plan and demonstrated an understanding of the instructions.   The patient was advised to call back or seek an in-person evaluation if the symptoms worsen or if the condition fails to improve as anticipated.  The above assessment and management plan was discussed with the patient. The patient verbalized understanding of and has agreed to the management plan. Patient is aware to call the clinic if symptoms persist or worsen. Patient is aware when to return to the clinic for a follow-up visit. Patient educated on when it is appropriate to go to the emergency department.   Time call ended:  4:45 pm   I provided 16 minutes of face-to-face time during this encounter.    Evelina Dun, FNP

## 2019-07-18 ENCOUNTER — Encounter (HOSPITAL_COMMUNITY): Payer: Self-pay | Admitting: Psychiatry

## 2019-07-18 ENCOUNTER — Other Ambulatory Visit: Payer: Self-pay

## 2019-07-18 ENCOUNTER — Ambulatory Visit (INDEPENDENT_AMBULATORY_CARE_PROVIDER_SITE_OTHER): Payer: 59 | Admitting: Psychiatry

## 2019-07-18 DIAGNOSIS — F411 Generalized anxiety disorder: Secondary | ICD-10-CM

## 2019-07-18 DIAGNOSIS — F3131 Bipolar disorder, current episode depressed, mild: Secondary | ICD-10-CM

## 2019-07-18 MED ORDER — LAMOTRIGINE 25 MG PO TABS: 75.0000 mg | ORAL_TABLET | Freq: Every day | ORAL | 2 refills | Status: DC

## 2019-07-18 MED ORDER — ALPRAZOLAM 0.5 MG PO TABS: ORAL_TABLET | ORAL | 2 refills | Status: DC

## 2019-07-18 NOTE — Progress Notes (Signed)
Virtual Visit via Telephone Note  I connected with Maureen [name] on 07/18/19 at  2:20 PM EDT by telephone and verified that I am speaking with the correct person using two identifiers.   I discussed the limitations, risks, security and privacy concerns of performing an evaluation and management service by telephone and the availability of in person appointments. I also discussed with the patient that there may be a patient responsible charge related to this service. The patient expressed understanding and agreed to proceed.   History of Present Illness: Patient was evaluated by phone session.  She is stressed that because of her daughter who is using drugs and she had a baby from her boyfriend.  Her daughter is now staying with her father.  She is sad about the situation.  She started Al-Anon so she can help her daughter.  She also endorses stress at work but she also reported that she had decided to cut down her pain medicine.  She does not want to take too much pain medicine though she still have pain but now she uses less and only if she needed.  She is taking Soma, Percocet and hydrocodone.  She is no longer taking gabapentin.  She is sleeping okay.  She is not drinking or using any illegal substances.  She denies any mania or psychosis but is still sometimes gets emotional and irritable.  She does not want to increase her Lamictal as in the past she had tried higher dose but could not handle it.  She admitted taking Xanax but she is also trying to cut down the dose and some days she is only take half at bedtime.  She denies any suicidal thoughts or homicidal thought.  She denies any mania or psychosis.  She is not having any tremors shakes or any rash with the Lamictal.  She is not interested in therapy.    Past Psychiatric History:Reviewed. H/odrug addiction,depression and bipolar d/o since20s. H/O overdoseand inpatientat age 53.H/obrief ER visit when threatened to kill herself. H/Omood  swing, maniaimpulsive behavior. Antidepressant cause mania. Triedlithium, Prozac, Zyprexa, Abilify and increase dose of Lamictal with limited response. Wetried [place] but developed manic symptoms and Valium caused constipation.[facility]   Psychiatric Specialty Exam: Physical Exam  ROS  There were no vitals taken for this visit.There is no height or weight on file to calculate BMI.  General Appearance: NA  Eye Contact:  NA  Speech:  Normal Rate  Volume:  Normal  Mood:  Anxious  Affect:  NA  Thought Process:  Goal Directed  Orientation:  Full (Time, Place, and Person)  Thought Content:  Logical  Suicidal Thoughts:  No  Homicidal Thoughts:  No  Memory:  Immediate;   Good Recent;   Good Remote;   Good  Judgement:  Fair  Insight:  Good  Psychomotor Activity:  NA  Concentration:  Concentration: Fair and Attention Span: Fair  Recall:  Good  Fund of Knowledge:  Good  Language:  Good  Akathisia:  No  Handed:  Right  AIMS (if indicated):     Assets:  Communication Skills Desire for Improvement Housing Resilience Social Support Talents/Skills  ADL's:  Intact  Cognition:  WNL  Sleep:         Assessment and Plan: Bipolar disorder type I.  Generalized anxiety disorder.  Reassurance given.  One more time I encourage to see therapist but patient refused.  She like to keep her current medication which she  feel helping her but also wants to continue Al-Anon to help her daughter who is using drugs.  Discussed medication side effects and benefits.  Continue Lamictal 75 mg daily and Xanax 0.5 mg as needed.  She is no longer taking gabapentin from her primary care physician.  Recommended to call us back if she has any question or any concern.  Follow-up in 3 months.  Follow Up Instructions:    I discussed the assessment and treatment plan with the patient. The patient was provided an opportunity to ask questions and all were  answered. The patient agreed with the plan and demonstrated an understanding of the instructions.   The patient was advised to call back or seek an in-person evaluation if the symptoms worsen or if the condition fails to improve as anticipated.  I provided 20 minutes of non-face-to-face time during this encounter.   [name], MD

## 2019-07-22 ENCOUNTER — Ambulatory Visit (INDEPENDENT_AMBULATORY_CARE_PROVIDER_SITE_OTHER): Payer: 59

## 2019-07-22 ENCOUNTER — Ambulatory Visit: Payer: 59 | Admitting: Physician Assistant

## 2019-07-22 ENCOUNTER — Encounter: Payer: Self-pay | Admitting: Physician Assistant

## 2019-07-22 ENCOUNTER — Other Ambulatory Visit: Payer: Self-pay

## 2019-07-22 DIAGNOSIS — M542 Cervicalgia: Secondary | ICD-10-CM | POA: Diagnosis not present

## 2019-07-22 DIAGNOSIS — M25512 Pain in left shoulder: Secondary | ICD-10-CM | POA: Diagnosis not present

## 2019-07-22 DIAGNOSIS — M545 Low back pain, unspecified: Secondary | ICD-10-CM

## 2019-07-23 ENCOUNTER — Other Ambulatory Visit: Payer: Self-pay | Admitting: Physician Assistant

## 2019-07-23 MED ORDER — PREDNISONE 10 MG (48) PO TBPK: ORAL_TABLET | ORAL | 0 refills | Status: DC
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
## 2019-10-03 ENCOUNTER — Other Ambulatory Visit: Payer: Self-pay

## 2019-10-03 DIAGNOSIS — Z20822 Contact with and (suspected) exposure to covid-19: Secondary | ICD-10-CM

## 2019-10-05 LAB — NOVEL CORONAVIRUS, NAA: SARS-CoV-2, NAA: NOT DETECTED

## 2019-10-14 ENCOUNTER — Ambulatory Visit (HOSPITAL_COMMUNITY): Payer: 59 | Admitting: Psychiatry

## 2019-10-21 ENCOUNTER — Other Ambulatory Visit: Payer: Self-pay

## 2019-10-21 ENCOUNTER — Ambulatory Visit (INDEPENDENT_AMBULATORY_CARE_PROVIDER_SITE_OTHER): Payer: 59 | Admitting: Psychiatry

## 2019-10-21 ENCOUNTER — Encounter (HOSPITAL_COMMUNITY): Payer: Self-pay | Admitting: Psychiatry

## 2019-10-21 DIAGNOSIS — F3131 Bipolar disorder, current episode depressed, mild: Secondary | ICD-10-CM | POA: Diagnosis not present

## 2019-10-21 DIAGNOSIS — F411 Generalized anxiety disorder: Secondary | ICD-10-CM

## 2019-10-21 MED ORDER — ALPRAZOLAM 0.5 MG PO TABS: ORAL_TABLET | ORAL | 2 refills | Status: DC

## 2019-10-21 MED ORDER — LAMOTRIGINE 25 MG PO TABS: 75.0000 mg | ORAL_TABLET | Freq: Every day | ORAL | 2 refills | Status: DC

## 2019-10-21 NOTE — Progress Notes (Signed)
Virtual Visit via Telephone Note  I connected with Maureen [name] on 10/21/19 at  2:20 PM EST by telephone and verified that I am speaking with the correct person using two identifiers.   I discussed the limitations, risks, security and privacy concerns of performing an evaluation and management service by telephone and the availability of in person appointments. I also discussed with the patient that there may be a patient responsible charge related to this service. The patient expressed understanding and agreed to proceed.   History of Present Illness: Patient was evaluated by phone session.  She is taking her medication as prescribed.  She is now start walking and hiking 2-3 times in a month.  She lives close by to hang in [place] and she enjoys hiking.  She is pleased the current medicine is working.  She had a good Christmas despite lot of family issues.  Her pain is under control with pain management.  She is working and so far no issues at work.  She still concerned about her daughter who is partying but not sure if she is using heavy drugs.  She had decided to start therapy next year as she believes it will be affordable next year.  Patient denies any mania, impulsive behavior, severe irritability, psychosis or any depression.  She is still gets some time emotional when she believes sucked in in the family drama but so far she is handling much better.  She takes Xanax at night and very rarely she takes half tablet during the day.  She has no rash, itching, tremors or shakes.  She does not want to change medication.  Past Psychiatric History:Reviewed. H/odrug addiction,anxiety and bipolar d/o. H/O overdoseand inpatient. H/obrief ER visit when threatened to kill herself. Antidepressant cause mania. Triedlithium, Prozac, Zyprexa, Abilify and increase dose of Lamictal with limited response. Wetried [place] but developed manic symptomsand Valium caused constipation.[facility]    Psychiatric Specialty Exam: Physical Exam  Review of Systems  There were no vitals taken for this visit.There is no height or weight on file to calculate BMI.  General Appearance: NA  Eye Contact:  NA  Speech:  Clear and Coherent and Normal Rate  Volume:  Normal  Mood:  Anxious  Affect:  NA  Thought Process:  Goal Directed  Orientation:  Full (Time, Place, and Person)  Thought Content:  Logical  Suicidal Thoughts:  No  Homicidal Thoughts:  No  Memory:  Immediate;   Good Recent;   Good Remote;   Good  Judgement:  Intact  Insight:  Present  Psychomotor Activity:  NA  Concentration:  Concentration: Fair and Attention Span: Fair  Recall:  Good  Fund of Knowledge:  Good  Language:  Good  Akathisia:  No  Handed:  Right  AIMS (if indicated):     Assets:  Communication Skills Desire for Improvement Housing Resilience Social Support  ADL's:  Intact  Cognition:  WNL  Sleep:   good      Assessment and Plan: Bipolar disorder type I.  Generalized anxiety disorder.  Patient like to keep her current medication since she feel her medicines working.  Now she agreed to see a therapist starting next year and also like to continue Al-Anon to help her daughter who she believe using drugs.  Discussed medication side effects and benefits.  Continue Lamictal 25 mg daily and Xanax 0.5 mg at bedtime and half tablet as needed during the day.  Recommended to  call us back if she has any question or any concern.  Follow-up in 3 months.  Follow Up Instructions:    I discussed the assessment and treatment plan with the patient. The patient was provided an opportunity to ask questions and all were answered. The patient agreed with the plan and demonstrated an understanding of the instructions.   The patient was advised to call back or seek an in-person evaluation if the symptoms worsen or if the condition fails to improve as anticipated.  I provided 20  minutes of non-face-to-face time during this encounter.   [name], MD

## 2019-11-04 ENCOUNTER — Ambulatory Visit (INDEPENDENT_AMBULATORY_CARE_PROVIDER_SITE_OTHER): Payer: 59 | Admitting: Nurse Practitioner

## 2019-11-04 ENCOUNTER — Other Ambulatory Visit: Payer: Self-pay

## 2019-11-04 DIAGNOSIS — J029 Acute pharyngitis, unspecified: Secondary | ICD-10-CM | POA: Diagnosis not present

## 2019-11-04 MED ORDER — CEFDINIR 300 MG PO CAPS: 300.0000 mg | ORAL_CAPSULE | Freq: Two times a day (BID) | ORAL | 0 refills | Status: DC

## 2019-11-04 NOTE — Progress Notes (Signed)
   Virtual Visit via telephone Note Due to COVID-19 pandemic this visit was conducted virtually. This visit type was conducted due to national recommendations for restrictions regarding the COVID-19 Pandemic (e.g. social distancing, sheltering in place) in an effort to limit this patient's exposure and mitigate transmission in our community. All issues noted in this document were discussed and addressed.  A physical exam was not performed with this format.  I connected with Maureen [name] on 11/04/19 at 4:40 by telephone and verified that I am speaking with the correct person using two identifiers. Maureen [name] is currently located at home and no one is currently with her during visit. The provider, Mary-Margaret Hassell Done, FNP is located in their office at time of visit.  I discussed the limitations, risks, security and privacy concerns of performing an evaluation and management service by telephone and the availability of in person appointments. I also discussed with the patient that there may be a patient responsible charge related to this service. The patient expressed understanding and agreed to proceed.   History and Present Illness:   Chief Complaint: sore throat  HPI Patient calls in c/o sore throat with blisters and her ear started hurting. This has been gong on for over a week. She had covid testing done on Saturday and it was negative.   Review of Systems  Constitutional: Negative for chills and fever.  HENT: Positive for sore throat.   Respiratory: Negative.   Neurological: Positive for headaches.  Psychiatric/Behavioral: Negative.   All other systems reviewed and are negative.    Observations/Objective: Alert and oriented- answers all questions appropriately No distress    Assessment and Plan: [name] in today with chief complaint of No chief complaint on file.   1. Pharyngitis, unspecified etiology Force fluids Motrin or tylenol OTC OTC  decongestant Throat lozenges if help New toothbrush in 3 days Meds ordered this encounter  Medications  . cefdinir (OMNICEF) 300 MG capsule    Sig: Take 1 capsule (300 mg total) by mouth 2 (two) times daily. 1 po BID    Dispense:  20 capsule    Refill:  0    Order Specific Question:   Supervising Provider    Answer:   Caryl Pina A [id number]   * I attempted to tell patient this was viral then she kept adding on the number of days she has had it. Now says it has ben greater then 10 days.   Follow Up Instructions: prn    I discussed the assessment and treatment plan with the patient. The patient was provided an opportunity to ask questions and all were answered. The patient agreed with the plan and demonstrated an understanding of the instructions.   The patient was advised to call back or seek an in-person evaluation if the symptoms worsen or if the condition fails to improve as anticipated.  The above assessment and management plan was discussed with the patient. The patient verbalized understanding of and has agreed to the management plan. Patient is aware to call the clinic if symptoms persist or worsen. Patient is aware when to return to the clinic for a follow-up visit. Patient educated on when it is appropriate to go to the emergency department.   Time call ended:  4:55  I provided 15 minutes of non-face-to-face time during this encounter.    Mary-Margaret Hassell Done, FNP

## 2019-11-25 NOTE — Progress Notes (Deleted)
54 y.o. [id number] Legally Separated White or Caucasian Not Hispanic or Latino female here for annual exam.      No LMP recorded. Patient has had a hysterectomy.          Sexually active: {yes no:314532}  The current method of family planning is {contraception:315051}.    Exercising: {yes no:314532}  {types:19826} Smoker:  {YES NO:22349}  Health Maintenance: Pap:  2012 normal per patient  History of abnormal Pap:  no MMG: 04/03/18  BMD:   Density C Bi-rads 1 neg  Colonoscopy: 08-19-14 WNL -repeat in 10 years  TDaP:  *** Gardasil: NA  reports that she quit smoking about 11 years ago. Her smoking use included cigarettes. She has a 10.00 pack-year smoking history. She has never used smokeless tobacco. She reports that she does not drink alcohol or use drugs.  Past Medical History:  Diagnosis Date  . Abdominal pain   . Anxiety    takes Xanax daily as needed  . Arthritis   . Cancer ([place])    MOLE PRE  . Cerebrospinal fluid leak from spinal puncture 08/24/2012  . Chronic back pain   . Constipation    takes Dulcolax and Miralax daily as needed;also Fiber Pills  . Depression    takes Lamictal daily  . Fibromyalgia   . GERD (gastroesophageal reflux disease)    takes Omeprazole daily  . Headache(784.0)   . Heart murmur    slight  . History of bronchitis    last time many yrs ago(16yrs ago)  . Internal hemorrhoid   . Interstitial cystitis    no problems since beginning of Jan 2014  . Joint pain   . Joint swelling   . Lupus ([place])    takes Plaquenil daily  . Muscle spasm    takes Robaxin daily as needed  . Numbness and tingling in hands   . Osteoarthritis   . Pneumonia    hx of > 70yrs ago  . Raynaud's disease   . Urinary frequency   . Urinary urgency     Past Surgical History:  Procedure Laterality Date  . ABDOMINAL HERNIA REPAIR    . APPENDECTOMY    . CESAREAN SECTION    . CHOLECYSTECTOMY    . COLONOSCOPY    . DIAGNOSTIC LAPAROSCOPY     adhesions  . ENDOMETRIAL  ABLATION    . ESOPHAGOGASTRODUODENOSCOPY N/A 05/08/2013   Procedure: ESOPHAGOGASTRODUODENOSCOPY (EGD);  Surgeon: Rogene Houston, MD;  Location: [facility];  Service: Endoscopy;  Laterality: N/A;  315  . NASAL SEPTUM SURGERY    . OVARIAN CYST REMOVAL    . SHOULDER SURGERY Right    x 2  . SPINAL CORD STIMULATOR INSERTION N/A 06/13/2014   Procedure: LUMBAR SPINAL CORD STIMULATOR INSERTION;  Surgeon: Bonna Gains, MD;  Location: [facility];  Service: Neurosurgery;  Laterality: N/A;  . SPINAL FUSION     x 2   . TONSILLECTOMY    . TUBAL LIGATION    . uterine ablation    . VAGINAL HYSTERECTOMY  8/12    Current Outpatient Medications  Medication Sig Dispense Refill  . ALPRAZolam (XANAX) 0.5 MG tablet Take one tab daily at bed time and 2nd during the day if needed for anxiety 35 tablet 2  . bacitracin ophthalmic ointment Place 1 application into the left eye 4 (four) times daily. apply to eye 3.5 g 0  . carisoprodol (SOMA) 350 MG tablet Take 350 mg by mouth 2 (two) times daily as  needed for muscle spasms.   2  . cefdinir (OMNICEF) 300 MG capsule Take 1 capsule (300 mg total) by mouth 2 (two) times daily. 1 po BID 20 capsule 0  . cetirizine (ZYRTEC) 10 MG tablet Take 1 tablet (10 mg total) by mouth daily. 30 tablet 11  . Cholecalciferol (VITAMIN D3) 5000 units CAPS Take 1 tablet by mouth daily.    . diclofenac (VOLTAREN) 75 MG EC tablet Take 1 tablet (75 mg total) by mouth 2 (two) times daily. 30 tablet 0  . estradiol (VIVELLE-DOT) 0.05 MG/24HR patch Place 1 patch (0.05 mg total) onto the skin 2 (two) times a week. 24 patch 3  . fluticasone (FLONASE) 50 MCG/ACT nasal spray Place 2 sprays into both nostrils daily. 16 g 6  . hydrochlorothiazide (HYDRODIURIL) 25 MG tablet TAKE 1 TABLET BY MOUTH EVERY DAY 30 tablet 5  . HYDROcodone-acetaminophen (NORCO) 7.5-325 MG tablet Take 1 tablet by mouth every 6 (six) hours as needed for moderate pain. 120 tablet 0  . lamoTRIgine (LAMICTAL) 25 MG tablet  Take 3 tablets (75 mg total) by mouth daily. 90 tablet 2  . oxyCODONE-acetaminophen (PERCOCET/ROXICET) 5-325 MG tablet Take 1 tablet by mouth at bedtime as needed. Pain.  0  . predniSONE (STERAPRED UNI-PAK 48 TAB) 10 MG (48) TBPK tablet Take as directed for 12 days 48 tablet 0  . Quinacrine HCl POWD 100 mg by Misc.(Non-Drug; Combo Route) route daily.    . TURMERIC PO Take by mouth daily.     No current facility-administered medications for this visit.    Family History  Problem Relation Age of Onset  . Hyperlipidemia Mother   . Depression Mother   . Anxiety disorder Mother   . Ovarian cancer Maternal Aunt   . Colon cancer Maternal Uncle   . Colon cancer Paternal Aunt   . Breast cancer Maternal Grandmother   . Alzheimer's disease Maternal Grandmother     Review of Systems  Exam:   There were no vitals taken for this visit.  Weight change: @WEIGHTCHANGE @ Height:      Ht Readings from Last 3 Encounters:  07/22/19 5\' 3"  (1.6 m)  04/18/19 5\' 3"  (1.6 m)  03/25/19 5\' 3"  (1.6 m)    General appearance: alert, cooperative and appears stated age Head: Normocephalic, without obvious abnormality, atraumatic Neck: no adenopathy, supple, symmetrical, trachea midline and thyroid {CHL AMB PHY EX THYROID NORM [phone]::"normal to inspection and palpation"} Lungs: clear to auscultation bilaterally Cardiovascular: regular rate and rhythm Breasts: {Exam; breast:13139::"normal appearance, no masses or tenderness"} Abdomen: soft, non-tender; non distended,  no masses,  no organomegaly Extremities: extremities normal, atraumatic, no cyanosis or edema Skin: Skin color, texture, turgor normal. No rashes or lesions Lymph nodes: Cervical, supraclavicular, and axillary nodes normal. No abnormal inguinal nodes palpated Neurologic: Grossly normal   Pelvic: External genitalia:  no lesions              Urethra:  normal appearing urethra with no masses, tenderness or lesions               Bartholins and Skenes: normal                 Vagina: normal appearing vagina with normal color and discharge, no lesions              Cervix: {CHL AMB PHY EX CERVIX NORM DEFAULT:[phone]::"no lesions"}               Bimanual Exam:  Uterus:  {CHL  AMB PHY EX UTERUS NORM DEFAULT:[phone]::"normal size, contour, position, consistency, mobility, non-tender"}              Adnexa: {CHL AMB PHY EX ADNEXA NO MASS DEFAULT:[phone]::"no mass, fullness, tenderness"}               Rectovaginal: Confirms               Anus:  normal sphincter tone, no lesions  *** chaperoned for the exam.  A:  Well Woman with normal exam  P:

## 2019-11-26 ENCOUNTER — Other Ambulatory Visit: Payer: Self-pay

## 2019-11-27 ENCOUNTER — Ambulatory Visit: Payer: 59 | Admitting: Obstetrics and Gynecology

## 2019-11-27 ENCOUNTER — Telehealth: Payer: Self-pay | Admitting: Obstetrics and Gynecology

## 2019-11-27 NOTE — Telephone Encounter (Signed)
Spoke with patient. Patient has 3 wks of current patch left, may need to change due to cost. Patient would like to discuss dosage change with Dr. Talbert Nan. R/s AEX to 12/18/19 at 10:30am with Dr. Talbert Nan. Patient will further discuss with Dr. Talbert Nan at this visit.   Questions answered.   Routing to provider for final review. Patient is agreeable to disposition. Will close encounter.

## 2019-11-27 NOTE — Telephone Encounter (Signed)
Patient is calling to request a replacement medication for the estradiol patch due to increased cost.

## 2019-12-03 ENCOUNTER — Encounter: Payer: 59 | Admitting: Family

## 2019-12-16 NOTE — Progress Notes (Signed)
54 y.o. [id number] Legally Separated White or Caucasian Not Hispanic or Latino female here for annual exam.  Patient states that her estradiol patching has gone up from $10 to $70. She started on the estrogen patch last year. She is on the 0.05 mg patch. She still gets some hot flashes, helped with her vaginal dryness. Currently having hot flashes, still bothering her. She would like to try the next higher dose. She states that her Rheumatologist is aware and okay with her being on ERT.  Not sexually active, she has just started dating.   She has a h/o IC, the estrogen has also helped that. Huge change.   No LMP recorded. Patient has had a hysterectomy.          Sexually active: no The current method of family planning is status post hysterectomy.    Exercising: Yes.    Hiking  Smoker:  no  Health Maintenance: Pap:  2012 WNL per patient History of abnormal Pap:  no MMG:  04/03/18 Density C Bi-rads 1 neg, missed last year with covid.  BMD:   Never  Colonoscopy: 08-19-14 WNL -repeat in 10 years  TDaP:  Unsure, declines  Gardasil: NA   reports that she quit smoking about 11 years ago. Her smoking use included cigarettes. She has a 10.00 pack-year smoking history. She has never used smokeless tobacco. She reports that she does not drink alcohol or use drugs. Just occasional ETOH. 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 3 grown kids.   Past Medical History:  Diagnosis Date  . Abdominal pain   . Anxiety    takes Xanax daily as needed  . Arthritis   . Cancer ([place])    MOLE PRE  . Cerebrospinal fluid leak from spinal puncture 08/24/2012  . Chronic back pain   . Constipation    takes Dulcolax and Miralax daily as needed;also Fiber Pills  . Depression    takes Lamictal daily  . Fibromyalgia   . GERD (gastroesophageal reflux disease)    takes Omeprazole daily  . Headache(784.0)   . Heart murmur    slight  . History of bronchitis    last time many yrs ago(49yrs ago)  . Internal hemorrhoid    . Interstitial cystitis    no problems since beginning of Jan 2014  . Joint pain   . Joint swelling   . Lupus ([place])    takes Plaquenil daily  . Muscle spasm    takes Robaxin daily as needed  . Numbness and tingling in hands   . Osteoarthritis   . Pneumonia    hx of > 48yrs ago  . Raynaud's disease   . Urinary frequency   . Urinary urgency     Past Surgical History:  Procedure Laterality Date  . ABDOMINAL HERNIA REPAIR    . APPENDECTOMY    . CESAREAN SECTION    . CHOLECYSTECTOMY    . COLONOSCOPY    . DIAGNOSTIC LAPAROSCOPY     adhesions  . ENDOMETRIAL ABLATION    . ESOPHAGOGASTRODUODENOSCOPY N/A 05/08/2013   Procedure: ESOPHAGOGASTRODUODENOSCOPY (EGD);  Surgeon: Rogene Houston, MD;  [facility];  Service: Endoscopy;  Laterality: N/A;  315  . NASAL SEPTUM SURGERY    . OVARIAN CYST REMOVAL    . SHOULDER SURGERY Right    x 2  . SPINAL CORD STIMULATOR INSERTION N/A 06/13/2014   Procedure: LUMBAR SPINAL CORD STIMULATOR INSERTION;  Surgeon: Bonna Gains, MD;  Location: [facility];  Service: Neurosurgery;  Laterality: N/A;  . SPINAL FUSION     x 2   . TONSILLECTOMY    . TUBAL LIGATION    . uterine ablation    . VAGINAL HYSTERECTOMY  8/12    Current Outpatient Medications  Medication Sig Dispense Refill  . ALPRAZolam (XANAX) 0.5 MG tablet Take one tab daily at bed time and 2nd during the day if needed for anxiety 35 tablet 2  . carisoprodol (SOMA) 350 MG tablet Take 350 mg by mouth 2 (two) times daily as needed for muscle spasms.   2  . cetirizine (ZYRTEC) 10 MG tablet Take 1 tablet (10 mg total) by mouth daily. 30 tablet 11  . Cholecalciferol (VITAMIN D3) 5000 units CAPS Take 1 tablet by mouth daily.    Marland Kitchen HYDROcodone-acetaminophen (NORCO) 7.5-325 MG tablet Take 1 tablet by mouth every 6 (six) hours as needed for moderate pain. 120 tablet 0  . lamoTRIgine (LAMICTAL) 25 MG tablet Take 3 tablets (75 mg total) by mouth daily. 90 tablet 2  . predniSONE  (STERAPRED UNI-PAK 48 TAB) 10 MG (48) TBPK tablet Take as directed for 12 days 48 tablet 0  . Quinacrine HCl POWD 100 mg by Misc.(Non-Drug; Combo Route) route daily.    . TURMERIC PO Take by mouth daily.    Derrill Memo ON 12/19/2019] estradiol (VIVELLE-DOT) 0.075 MG/24HR Place 1 patch onto the skin 2 (two) times a week. 24 patch 3   No current facility-administered medications for this visit.    Family History  Problem Relation Age of Onset  . Hyperlipidemia Mother   . Depression Mother   . Anxiety disorder Mother   . Ovarian cancer Maternal Aunt   . Colon cancer Maternal Uncle   . Colon cancer Paternal Aunt   . Breast cancer Maternal Grandmother   . Alzheimer's disease Maternal Grandmother     Review of Systems  All other systems reviewed and are negative.   Exam:   BP 118/84   Pulse 79   Temp 98.1 F (36.7 C)   Ht 5\' 3"  (1.6 m)   Wt 147 lb (66.7 kg)   SpO2 95%   BMI 26.04 kg/m   Weight change: @WEIGHTCHANGE @ Height:   Height: 5\' 3"  (160 cm)  Ht Readings from Last 3 Encounters:  12/18/19 5\' 3"  (1.6 m)  07/22/19 5\' 3"  (1.6 m)  04/18/19 5\' 3"  (1.6 m)    General appearance: alert, cooperative and appears stated age Head: Normocephalic, without obvious abnormality, atraumatic Neck: no adenopathy, supple, symmetrical, trachea midline and thyroid normal to inspection and palpation Lungs: clear to auscultation bilaterally Cardiovascular: regular rate and rhythm Breasts: normal appearance, no masses or tenderness Abdomen: soft, non-tender; non distended,  no masses,  no organomegaly Extremities: extremities normal, atraumatic, no cyanosis or edema Skin: Skin color, texture, turgor normal. No rashes or lesions Lymph nodes: Cervical, supraclavicular, and axillary nodes normal. No abnormal inguinal nodes palpated Neurologic: Grossly normal   Pelvic: External genitalia:  no lesions              Urethra:  normal appearing urethra with no masses, tenderness or lesions               Bartholins and Skenes: normal                 Vagina: normal appearing vagina with normal color and discharge, no lesions              Cervix: absent  Bimanual Exam:  Uterus:  uterus absent              Adnexa: no mass, fullness, tenderness               Rectovaginal: Confirms               Anus:  normal sphincter tone, no lesions  Gae Dry chaperoned for the exam.  A:  Well Woman with normal exam  ERT, she is aware of the risks wants to continue. Desires next higher dose.   H/O lupus like connective tissue disorder  P:   No pap needed  Labs with primary  Mammogram overdue, she will schedule. Aware she needs to get the mammogram to be on estrogen  Discussed breast self exam  Discussed calcium and vit D intake  Colonoscopy UTD    CC: Maureen Dun, FNP Dr Wendall Mola, Rheumatology Department [facility])

## 2019-12-17 ENCOUNTER — Other Ambulatory Visit: Payer: Self-pay

## 2019-12-18 ENCOUNTER — Ambulatory Visit: Payer: 59 | Admitting: Obstetrics and Gynecology

## 2019-12-18 ENCOUNTER — Encounter: Payer: Self-pay | Admitting: Obstetrics and Gynecology

## 2019-12-18 VITALS — BP 118/84 | HR 79 | Temp 98.1°F | Ht 63.0 in | Wt 147.0 lb

## 2019-12-18 DIAGNOSIS — Z01419 Encounter for gynecological examination (general) (routine) without abnormal findings: Secondary | ICD-10-CM | POA: Diagnosis not present

## 2019-12-18 DIAGNOSIS — N951 Menopausal and female climacteric states: Secondary | ICD-10-CM | POA: Diagnosis not present

## 2019-12-18 DIAGNOSIS — Z79890 Hormone replacement therapy (postmenopausal): Secondary | ICD-10-CM | POA: Diagnosis not present

## 2019-12-18 MED ORDER — ESTRADIOL 0.075 MG/24HR TD PTTW: 1.0000 | MEDICATED_PATCH | TRANSDERMAL | 3 refills | Status: DC

## 2019-12-18 NOTE — Patient Instructions (Signed)

## 2020-01-09 ENCOUNTER — Ambulatory Visit: Payer: 59 | Admitting: Obstetrics and Gynecology

## 2020-01-21 ENCOUNTER — Other Ambulatory Visit: Payer: Self-pay

## 2020-01-21 ENCOUNTER — Ambulatory Visit (HOSPITAL_COMMUNITY): Payer: 59 | Admitting: Psychiatry

## 2020-01-23 ENCOUNTER — Ambulatory Visit: Payer: 59 | Admitting: Obstetrics and Gynecology

## 2020-01-23 ENCOUNTER — Other Ambulatory Visit: Payer: Self-pay

## 2020-01-23 ENCOUNTER — Encounter: Payer: Self-pay | Admitting: Obstetrics and Gynecology

## 2020-01-23 ENCOUNTER — Telehealth: Payer: Self-pay | Admitting: Obstetrics and Gynecology

## 2020-01-23 VITALS — BP 110/66 | HR 74 | Temp 98.4°F | Ht 63.0 in | Wt 146.6 lb

## 2020-01-23 DIAGNOSIS — R350 Frequency of micturition: Secondary | ICD-10-CM

## 2020-01-23 DIAGNOSIS — N760 Acute vaginitis: Secondary | ICD-10-CM | POA: Diagnosis not present

## 2020-01-23 DIAGNOSIS — R3915 Urgency of urination: Secondary | ICD-10-CM

## 2020-01-23 DIAGNOSIS — N3010 Interstitial cystitis (chronic) without hematuria: Secondary | ICD-10-CM | POA: Diagnosis not present

## 2020-01-23 LAB — POCT URINALYSIS DIPSTICK
Bilirubin, UA: NEGATIVE
Glucose, UA: NEGATIVE
Ketones, UA: NEGATIVE
Leukocytes, UA: NEGATIVE
Nitrite, UA: NEGATIVE
Protein, UA: NEGATIVE
Urobilinogen, UA: NEGATIVE E.U./dL — AB
pH, UA: 7 (ref 5.0–8.0)

## 2020-01-23 MED ORDER — BETAMETHASONE VALERATE 0.1 % EX OINT: 1.0000 "application " | TOPICAL_OINTMENT | Freq: Two times a day (BID) | CUTANEOUS | 0 refills | Status: DC

## 2020-01-23 NOTE — Telephone Encounter (Signed)
Patient is experiencing burning and uti symptoms.

## 2020-01-23 NOTE — Telephone Encounter (Signed)
Spoke back with pt. Pt scheduled for today at 3:30 pm with Dr Talbert Nan. Pt agreeable and verbalized understanding.   Routing to Dr Talbert Nan for review and will close encounter.

## 2020-01-23 NOTE — Progress Notes (Signed)
GYNECOLOGY  VISIT   HPI: 54 y.o.   Legally Separated White or Caucasian Not Hispanic or Latino  female   [phone] with No LMP recorded. Patient has had a hysterectomy.   here for Patient states that she hasnt had sex in 2 years and had sex 01/19/20 she says that she used a condom. Not painful. Patients states that she burning and is irritated.  She c/o a couple week h/o urinary urgency, frequency, some hesitancy to void, today started burning. She c/o vulvar irrtiation, swelling started today, painful, no discharge.     GYNECOLOGIC HISTORY: No LMP recorded. Patient has had a hysterectomy. Contraception: none  Menopausal hormone therapy: estradiol.         OB History    Gravida  2   Para  2   Term  1   Preterm  1   AB      Living  3     SAB      TAB      Ectopic      Multiple  1   Live Births  3              Patient Active Problem List   Diagnosis Date Noted  . Greater trochanteric bursitis of right hip 08/20/2015  . Fibrositis 01/29/2014  . Fibromyalgia 01/29/2014  . Atypical chest pain 09/02/2013  . Polypharmacy 05/01/2013  . Other long term (current) drug therapy 05/01/2013  . Arthritis 04/16/2013  . Abdominal pain, epigastric 03/26/2013  . Back pain, chronic 12/05/2012  . Connective tissue disease, undifferentiated ([place]) 12/05/2012  . Raynaud's syndrome without gangrene 12/05/2012  . Systemic lupus erythematosus ([place]) 12/05/2012  . Cerebrospinal fluid leak from spinal puncture 08/24/2012  . UNSPECIFIED DISORDER OF STOMACH AND DUODENUM 08/26/2008    Past Medical History:  Diagnosis Date  . Abdominal pain   . Anxiety    takes Xanax daily as needed  . Arthritis   . Cancer ([place])    MOLE PRE  . Cerebrospinal fluid leak from spinal puncture 08/24/2012  . Chronic back pain   . Constipation    takes Dulcolax and Miralax daily as needed;also Fiber Pills  . Depression    takes Lamictal daily  . Fibromyalgia   . GERD (gastroesophageal reflux disease)     takes Omeprazole daily  . Headache(784.0)   . Heart murmur    slight  . History of bronchitis    last time many yrs ago(73yrs ago)  . Internal hemorrhoid   . Interstitial cystitis    no problems since beginning of Jan 2014  . Joint pain   . Joint swelling   . Lupus ([place])    takes Plaquenil daily  . Muscle spasm    takes Robaxin daily as needed  . Numbness and tingling in hands   . Osteoarthritis   . Pneumonia    hx of > 22yrs ago  . Raynaud's disease   . Urinary frequency   . Urinary urgency     Past Surgical History:  Procedure Laterality Date  . ABDOMINAL HERNIA REPAIR    . APPENDECTOMY    . CESAREAN SECTION    . CHOLECYSTECTOMY    . COLONOSCOPY    . DIAGNOSTIC LAPAROSCOPY     adhesions  . ENDOMETRIAL ABLATION    . ESOPHAGOGASTRODUODENOSCOPY N/A 05/08/2013   Procedure: ESOPHAGOGASTRODUODENOSCOPY (EGD);  Surgeon: Rogene Houston, MD;  [facility];  Service: Endoscopy;  Laterality: N/A;  315  . NASAL SEPTUM SURGERY    .  OVARIAN CYST REMOVAL    . SHOULDER SURGERY Right    x 2  . SPINAL CORD STIMULATOR INSERTION N/A 06/13/2014   Procedure: LUMBAR SPINAL CORD STIMULATOR INSERTION;  Surgeon: Bonna Gains, MD;  Location: [facility];  Service: Neurosurgery;  Laterality: N/A;  . SPINAL FUSION     x 2   . TONSILLECTOMY    . TUBAL LIGATION    . uterine ablation    . VAGINAL HYSTERECTOMY  8/12    Current Outpatient Medications  Medication Sig Dispense Refill  . ALPRAZolam (XANAX) 0.5 MG tablet Take one tab daily at bed time and 2nd during the day if needed for anxiety 35 tablet 2  . carisoprodol (SOMA) 350 MG tablet Take 350 mg by mouth 2 (two) times daily as needed for muscle spasms.   2  . cetirizine (ZYRTEC) 10 MG tablet Take 1 tablet (10 mg total) by mouth daily. 30 tablet 11  . Cholecalciferol (VITAMIN D3) 5000 units CAPS Take 1 tablet by mouth daily.    Marland Kitchen estradiol (VIVELLE-DOT) 0.075 MG/24HR Place 1 patch onto the skin 2 (two) times a week. 24 patch  3  . HYDROcodone-acetaminophen (NORCO) 7.5-325 MG tablet Take 1 tablet by mouth every 6 (six) hours as needed for moderate pain. 120 tablet 0  . lamoTRIgine (LAMICTAL) 25 MG tablet Take 3 tablets (75 mg total) by mouth daily. 90 tablet 2  . predniSONE (STERAPRED UNI-PAK 48 TAB) 10 MG (48) TBPK tablet Take as directed for 12 days 48 tablet 0  . Quinacrine HCl POWD 100 mg by Misc.(Non-Drug; Combo Route) route daily.    . TURMERIC PO Take by mouth daily.     No current facility-administered medications for this visit.     ALLERGIES: Amoxicillin, Penicillins, and Tegretol [carbamazepine]  Family History  Problem Relation Age of Onset  . Hyperlipidemia Mother   . Depression Mother   . Anxiety disorder Mother   . Ovarian cancer Maternal Aunt   . Colon cancer Maternal Uncle   . Colon cancer Paternal Aunt   . Breast cancer Maternal Grandmother   . Alzheimer's disease Maternal Grandmother     Social History   Socioeconomic History  . Marital status: Legally Separated    Spouse name: Not on file  . Number of children: Not on file  . Years of education: Not on file  . Highest education level: Not on file  Occupational History  . Not on file  Tobacco Use  . Smoking status: Former Smoker    Packs/day: 0.50    Years: 20.00    Pack years: 10.00    Types: Cigarettes    Quit date: 06/09/2008    Years since quitting: 11.6  . Smokeless tobacco: Never Used  Substance and Sexual Activity  . Alcohol use: No    Alcohol/week: 0.0 standard drinks  . Drug use: No  . Sexual activity: Not Currently    Partners: Male    Birth control/protection: Surgical  Other Topics Concern  . Not on file  Social History Narrative  . Not on file   Social Determinants of Health   Financial Resource Strain:   . Difficulty of Paying Living Expenses:   Food Insecurity:   . Worried About Charity fundraiser in the Last Year:   . Arboriculturist in the Last Year:   Transportation Needs:   . Lexicographer (Medical):   Marland Kitchen Lack of Transportation (Non-Medical):   Physical Activity:   .  Days of Exercise per Week:   . Minutes of Exercise per Session:   Stress:   . Feeling of Stress :   Social Connections:   . Frequency of Communication with Friends and Family:   . Frequency of Social Gatherings with Friends and Family:   . Attends Religious Services:   . Active Member of Clubs or Organizations:   . Attends Archivist Meetings:   Marland Kitchen Marital Status:   Intimate Partner Violence:   . Fear of Current or Ex-Partner:   . Emotionally Abused:   Marland Kitchen Physically Abused:   . Sexually Abused:     Review of Systems  Genitourinary: Positive for dysuria, frequency, hematuria and urgency.  All other systems reviewed and are negative.   PHYSICAL EXAMINATION:    BP 110/66   Pulse 74   Temp 98.4 F (36.9 C)   Ht 5\' 3"  (1.6 m)   Wt 146 lb 9.6 oz (66.5 kg)   SpO2 97%   BMI 25.97 kg/m     General appearance: alert, cooperative and appears stated age Abdomen: soft, mildly tender in the SP region; non distended, no masses,  no organomegaly CVA: not tender  Pelvic: External genitalia:  no lesions, mild erythema              Urethra:  normal appearing urethra with no masses, tenderness or lesions              Bartholins and Skenes: normal                 Vagina: normal appearing vagina with a slight amount of thick, white vaginal discharge              Cervix: absent              Bimanual Exam:  Uterus:  uterus absent              Adnexa: no mass, fullness, tenderness              Bladder: tender  Chaperone was present for exam.  Urine dip: trace blood, other wise negative.   Wet prep: no clue, no trich, + wbc KOH: no yeast PH: 4   ASSESSMENT Cystitis symptoms, urine dip only + for trace blood H/O IC Vaginitis, negative slides    PLAN Send urine for ua, c&s Hydrate well Will not treat for UTI unless her culture is + or her symptoms worsen prior to her culture  returning Nuswab vaginitis panel sent Steroid ointment for vulvar discomfort

## 2020-01-23 NOTE — Telephone Encounter (Signed)
Spoke to pt. Pt states having burning and swelling with irritation that started today. Pt states has not been SA for 2 years until last Sunday and is now having sx. Pt has hx of cystitis. Pt requests to be seen today. Will review with Dr Talbert Nan.   Routing to Dr Talbert Nan for advise and recommendations.

## 2020-01-24 LAB — URINALYSIS, MICROSCOPIC ONLY
Bacteria, UA: NONE SEEN
Casts: NONE SEEN /lpf
RBC, Urine: NONE SEEN /hpf (ref 0–2)
WBC, UA: NONE SEEN /hpf (ref 0–5)

## 2020-01-25 LAB — URINE CULTURE

## 2020-01-27 LAB — NUSWAB VAGINITIS (VG)
Candida albicans, NAA: NEGATIVE
Candida glabrata, NAA: NEGATIVE
Trich vag by NAA: NEGATIVE

## 2020-02-19 ENCOUNTER — Other Ambulatory Visit: Payer: Self-pay | Admitting: Obstetrics and Gynecology

## 2020-02-19 ENCOUNTER — Ambulatory Visit
Admission: RE | Admit: 2020-02-19 | Discharge: 2020-02-19 | Disposition: A | Discharge status: Home / Self Care | Payer: 59 | Source: Ambulatory Visit | Attending: Obstetrics and Gynecology | Admitting: Obstetrics and Gynecology

## 2020-02-19 ENCOUNTER — Other Ambulatory Visit: Payer: Self-pay

## 2020-02-19 ENCOUNTER — Other Ambulatory Visit: Payer: Self-pay | Admitting: Family Medicine

## 2020-02-19 DIAGNOSIS — Z1231 Encounter for screening mammogram for malignant neoplasm of breast: Secondary | ICD-10-CM

## 2020-02-20 ENCOUNTER — Other Ambulatory Visit: Payer: Self-pay | Admitting: Obstetrics and Gynecology

## 2020-02-20 DIAGNOSIS — R928 Other abnormal and inconclusive findings on diagnostic imaging of breast: Secondary | ICD-10-CM

## 2020-02-28 ENCOUNTER — Other Ambulatory Visit: Payer: Self-pay

## 2020-02-28 ENCOUNTER — Ambulatory Visit
Admission: RE | Admit: 2020-02-28 | Discharge: 2020-02-28 | Disposition: A | Discharge status: Home / Self Care | Payer: 59 | Source: Ambulatory Visit | Attending: Obstetrics and Gynecology | Admitting: Obstetrics and Gynecology

## 2020-02-28 DIAGNOSIS — R928 Other abnormal and inconclusive findings on diagnostic imaging of breast: Secondary | ICD-10-CM

## 2020-03-26 ENCOUNTER — Encounter: Payer: Self-pay | Admitting: Obstetrics and Gynecology

## 2020-03-26 ENCOUNTER — Other Ambulatory Visit: Payer: Self-pay

## 2020-03-26 ENCOUNTER — Ambulatory Visit: Payer: 59 | Admitting: Obstetrics and Gynecology

## 2020-03-26 ENCOUNTER — Telehealth: Payer: Self-pay | Admitting: Obstetrics and Gynecology

## 2020-03-26 VITALS — BP 102/60 | HR 70 | Temp 97.9°F | Ht 63.0 in | Wt 145.0 lb

## 2020-03-26 DIAGNOSIS — N898 Other specified noninflammatory disorders of vagina: Secondary | ICD-10-CM

## 2020-03-26 DIAGNOSIS — Z113 Encounter for screening for infections with a predominantly sexual mode of transmission: Secondary | ICD-10-CM

## 2020-03-26 NOTE — Telephone Encounter (Signed)
Spoke with patient. Patient reports white vaginal d/c and cramping that started the past few days. Was seen at [facility] on 02/18/20 and tx for yeast with diflucan and BV with flagyl. Symptoms have returned. Denies vaginal odor, itching, urinary symptoms, or bleeding. Patient has recently become SA again, is concerned this may be contributing to the reoccurring symptoms. OV scheduled for today at 3:15pm with Dr. Talbert Nan. [id number] prescreen negative, precautions reviewed. Patient is aware this is a work in appt.   Last AEX 12/18/19  Routing to provider for final review. Patient is agreeable to disposition. Will close encounter.

## 2020-03-26 NOTE — Telephone Encounter (Signed)
Patient is concerned with vaginal discharge. Sending to triage to assist with scheduling.

## 2020-03-26 NOTE — Progress Notes (Signed)
GYNECOLOGY  VISIT   HPI: 54 y.o.   Legally Separated White or Caucasian Not Hispanic or Latino  female   [phone] with No LMP recorded. Patient has had a hysterectomy.   here for possible BV or yeast. Patient states that each time she has intercourse with new partner, she got severe vaginal irritation (they had used a condom). Was seen a month ago at urgent care and treated with Flagyl and Diflucan.  Currently has white discharge with low back pain after last intercourse 03/19/20 She was seen here in 4/21 with vaginitis symptoms and had a negative vaginitis panel.   She has had a new partner for the last month. The second time they had sex she was so sore and had terrible d/c, she was treated for BV and yeast. She had negative STD testing a few weeks ago. They are no longer using condoms.  Every time she has sex she gets irritated after. She had sex last Thursday, she currently has an increase in vaginal d/c, no itching, burning or irritation. The d/c is white, creamy, no odor. Mild pelvic cramping today and some low back pain (but has chronic back pain). No pain with sex.   GYNECOLOGIC HISTORY: No LMP recorded. Patient has had a hysterectomy. Contraception: hysterectomy Menopausal hormone therapy: Vivelle-Dot        OB History    Gravida  2   Para  2   Term  1   Preterm  1   AB      Living  3     SAB      TAB      Ectopic      Multiple  1   Live Births  3              Patient Active Problem List   Diagnosis Date Noted  . Greater trochanteric bursitis of right hip 08/20/2015  . Fibrositis 01/29/2014  . Fibromyalgia 01/29/2014  . Atypical chest pain 09/02/2013  . Polypharmacy 05/01/2013  . Other long term (current) drug therapy 05/01/2013  . Arthritis 04/16/2013  . Abdominal pain, epigastric 03/26/2013  . Back pain, chronic 12/05/2012  . Connective tissue disease, undifferentiated ([place]) 12/05/2012  . Raynaud's syndrome without gangrene 12/05/2012  . Systemic  lupus erythematosus ([place]) 12/05/2012  . Cerebrospinal fluid leak from spinal puncture 08/24/2012  . UNSPECIFIED DISORDER OF STOMACH AND DUODENUM 08/26/2008    Past Medical History:  Diagnosis Date  . Abdominal pain   . Anxiety    takes Xanax daily as needed  . Arthritis   . Cancer ([place])    MOLE PRE  . Cerebrospinal fluid leak from spinal puncture 08/24/2012  . Chronic back pain   . Constipation    takes Dulcolax and Miralax daily as needed;also Fiber Pills  . Depression    takes Lamictal daily  . Fibromyalgia   . GERD (gastroesophageal reflux disease)    takes Omeprazole daily  . Headache(784.0)   . Heart murmur    slight  . History of bronchitis    last time many yrs ago(57yrs ago)  . Internal hemorrhoid   . Interstitial cystitis    no problems since beginning of Jan 2014  . Joint pain   . Joint swelling   . Lupus ([place])    takes Plaquenil daily  . Muscle spasm    takes Robaxin daily as needed  . Numbness and tingling in hands   . Osteoarthritis   . Pneumonia    hx of >  58yrs ago  . Raynaud's disease   . Urinary frequency   . Urinary urgency     Past Surgical History:  Procedure Laterality Date  . ABDOMINAL HERNIA REPAIR    . APPENDECTOMY    . CESAREAN SECTION    . CHOLECYSTECTOMY    . COLONOSCOPY    . DIAGNOSTIC LAPAROSCOPY     adhesions  . ENDOMETRIAL ABLATION    . ESOPHAGOGASTRODUODENOSCOPY N/A 05/08/2013   Procedure: ESOPHAGOGASTRODUODENOSCOPY (EGD);  Surgeon: Rogene Houston, MD;  Location: [facility];  Service: Endoscopy;  Laterality: N/A;  315  . NASAL SEPTUM SURGERY    . OVARIAN CYST REMOVAL    . SHOULDER SURGERY Right    x 2  . SPINAL CORD STIMULATOR INSERTION N/A 06/13/2014   Procedure: LUMBAR SPINAL CORD STIMULATOR INSERTION;  Surgeon: Bonna Gains, MD;  Location: [facility];  Service: Neurosurgery;  Laterality: N/A;  . SPINAL FUSION     x 2   . TONSILLECTOMY    . TUBAL LIGATION    . uterine ablation    . VAGINAL HYSTERECTOMY  8/12     Current Outpatient Medications  Medication Sig Dispense Refill  . ALPRAZolam (XANAX) 0.5 MG tablet Take one tab daily at bed time and 2nd during the day if needed for anxiety 35 tablet 2  . carisoprodol (SOMA) 350 MG tablet Take 350 mg by mouth 2 (two) times daily as needed for muscle spasms.   2  . cetirizine (ZYRTEC) 10 MG tablet Take 1 tablet (10 mg total) by mouth daily. 30 tablet 11  . Cholecalciferol (VITAMIN D3) 5000 units CAPS Take 1 tablet by mouth daily.    Marland Kitchen estradiol (VIVELLE-DOT) 0.075 MG/24HR Place 1 patch onto the skin 2 (two) times a week. 24 patch 3  . HYDROcodone-acetaminophen (NORCO) 7.5-325 MG tablet Take 1 tablet by mouth every 6 (six) hours as needed for moderate pain. 120 tablet 0  . TURMERIC PO Take by mouth daily.     No current facility-administered medications for this visit.     ALLERGIES: Amoxicillin, Penicillins, and Tegretol [carbamazepine]  Family History  Problem Relation Age of Onset  . Hyperlipidemia Mother   . Depression Mother   . Anxiety disorder Mother   . Ovarian cancer Maternal Aunt   . Colon cancer Maternal Uncle   . Colon cancer Paternal Aunt   . Breast cancer Maternal Grandmother   . Alzheimer's disease Maternal Grandmother     Social History   Socioeconomic History  . Marital status: Legally Separated    Spouse name: Not on file  . Number of children: Not on file  . Years of education: Not on file  . Highest education level: Not on file  Occupational History  . Not on file  Tobacco Use  . Smoking status: Former Smoker    Packs/day: 0.50    Years: 20.00    Pack years: 10.00    Types: Cigarettes    Quit date: 06/09/2008    Years since quitting: 11.8  . Smokeless tobacco: Never Used  Substance and Sexual Activity  . Alcohol use: No    Alcohol/week: 0.0 standard drinks  . Drug use: No  . Sexual activity: Not Currently    Partners: Male    Birth control/protection: Surgical  Other Topics Concern  . Not on file  Social  History Narrative  . Not on file   Social Determinants of Health   Financial Resource Strain:   . Difficulty of Paying Living  Expenses:   Food Insecurity:   . Worried About Charity fundraiser in the Last Year:   . Arboriculturist in the Last Year:   Transportation Needs:   . Film/video editor (Medical):   Marland Kitchen Lack of Transportation (Non-Medical):   Physical Activity:   . Days of Exercise per Week:   . Minutes of Exercise per Session:   Stress:   . Feeling of Stress :   Social Connections:   . Frequency of Communication with Friends and Family:   . Frequency of Social Gatherings with Friends and Family:   . Attends Religious Services:   . Active Member of Clubs or Organizations:   . Attends Archivist Meetings:   Marland Kitchen Marital Status:   Intimate Partner Violence:   . Fear of Current or Ex-Partner:   . Emotionally Abused:   Marland Kitchen Physically Abused:   . Sexually Abused:     Review of Systems  Constitutional: Negative.   HENT: Negative.   Eyes: Negative.   Respiratory: Negative.   Cardiovascular: Negative.   Gastrointestinal: Negative.   Genitourinary: Positive for flank pain.       Vaginal discharge  Skin: Negative.   Neurological: Negative.   Endo/Heme/Allergies: Negative.   Psychiatric/Behavioral: Negative.     PHYSICAL EXAMINATION:    BP 102/60 (BP Location: Left Arm, Patient Position: Sitting, Cuff Size: Normal)   Pulse 70   Temp 97.9 F (36.6 C) (Temporal)   Ht 5\' 3"  (1.6 m)   Wt 145 lb (65.8 kg)   SpO2 99%   BMI 25.69 kg/m     General appearance: alert, cooperative and appears stated age Abdomen: soft, mildly tender in her lower abdomen, R>L; non distended, no masses,  no organomegaly  Pelvic: External genitalia:  no lesions              Urethra:  normal appearing urethra with no masses, tenderness or lesions              Bartholins and Skenes: normal                 Vagina: normal appearing vagina with normal color and discharge, no lesions               Cervix: absent              Bimanual Exam:  Uterus:  uterus absent              Adnexa: no mass, fullness, tenderness               Chaperone was present for exam.  Wet prep: no clue, no trich, few wbc KOH: no yeast PH: 4   ASSESSMENT Vaginal discharge, no abnormalities noted on exam, no findings of vaginal slides Screening STD Pelvic cramping, negative GYN exam    PLAN Nuswab for STD and vaginitis testing Information on vulvar skin care reviewed Sample of non lubricated condoms given

## 2020-03-27 LAB — HIV ANTIBODY (ROUTINE TESTING W REFLEX): HIV Screen 4th Generation wRfx: NONREACTIVE

## 2020-03-27 LAB — RPR: RPR Ser Ql: NONREACTIVE

## 2020-03-29 LAB — NUSWAB VAGINITIS PLUS (VG+)
Atopobium vaginae: HIGH Score — AB
Candida albicans, NAA: NEGATIVE
Candida glabrata, NAA: NEGATIVE
Chlamydia trachomatis, NAA: NEGATIVE
Neisseria gonorrhoeae, NAA: NEGATIVE
Trich vag by NAA: NEGATIVE

## 2020-04-02 ENCOUNTER — Telehealth: Payer: Self-pay

## 2020-04-02 NOTE — Telephone Encounter (Signed)
-----   Message from Salvadore Dom, MD sent at 04/02/2020  6:52 AM EDT ----- Please let the patient know that her vaginitis panel returned indeterminate for BV. See if she is still having symptoms of an increase in vaginal discharge, or if she has an odor. If she is having either of these symptoms then I would treat her, if not I wouldn't. Treatment is flagyl (either oral or vaginal, her choice), no ETOH while on Flagyl.  Oral: Flagyl 500 mg BID x 7 days, or Vaginal: Metrogel, 1 applicator per vagina q day x 5 days.

## 2020-04-02 NOTE — Telephone Encounter (Signed)
Spoke with pt. Pt given results and recommnedations per Dr Talbert Nan. Pt agreeable. Pt states sx are better and wants to wait it out over weekend since going to the beach. Pt states will call if sx are not resolving.  Advised will update Dr Talbert Nan. Pt verbalized understanding.  No Rx sent at this time.  Routing to Dr Talbert Nan Encounter closed.

## 2020-04-17 ENCOUNTER — Other Ambulatory Visit: Payer: Self-pay | Admitting: *Deleted

## 2020-04-17 NOTE — Telephone Encounter (Signed)
Medication refill request: Vivelle-dot  Last AEX:  12-18-19 JJ Next AEX: not currently scheduled  Last MMG (if hormonal medication request): 02-28-20 density C/BIRADS 2 benign  Refill authorized: Today, please advise.  Spoke with patient. Patient states she needs vivelle-dot sent to [facility] as it is cheaper through her insurance. RN advised would update Dr. Talbert Nan. Patient agreeable and appreciative of phone call.   Medication pended for #24, 2RF. Please refill if appropriate.

## 2020-04-19 MED ORDER — ESTRADIOL 0.075 MG/24HR TD PTTW: 1.0000 | MEDICATED_PATCH | TRANSDERMAL | 2 refills | Status: DC

## 2020-05-04 ENCOUNTER — Telehealth: Payer: Self-pay | Admitting: Obstetrics and Gynecology

## 2020-05-04 MED ORDER — METRONIDAZOLE 0.75 % VA GEL
1.0000 | Freq: Every day | VAGINAL | 0 refills | Status: AC
Start: 2020-05-04 — End: 2020-05-09

## 2020-05-04 NOTE — Telephone Encounter (Signed)
Spoke with pt. Pt given recommendations per Dr Talbert Nan. Pt agreeable. Pharmacy verified. Rx Metrogel sent to pharmacy # [id number], 0RF.   Pt advised to return call to office if sx not resolved after treatment. Pt agreeable.  Routing to Dr Talbert Nan for review.  Encounter closed.

## 2020-05-04 NOTE — Telephone Encounter (Signed)
Go ahead and treat her with flagyl (either oral or vaginal, her choice), no ETOH while on Flagyl.  Oral: Flagyl 500 mg BID x 7 days, or Vaginal: Metrogel, 1 applicator per vagina q day x 5 days.

## 2020-05-04 NOTE — Telephone Encounter (Signed)
Patient would like to discuss her recent results with a nurse.

## 2020-05-04 NOTE — Telephone Encounter (Signed)
OV 6/3 AEX 11/2019  Spoke with pt. Pt states still having BV sx from 03/26/20 vaginal swab. Pt had previously declined treatment due to going out of town x 2 weeks for vacation. Pt also states tried home remedies for treatment by taking probiotics, did not resolve sx.  Pt states still having vaginal discharge and odor. Pt wanting to know if can have treatment and not come back for an OV?  Pt advised will review with Dr Talbert Nan and return call with recommendations. Pt agreeable.   Routing to Dr Talbert Nan. Pharmacy verified.     Salvadore Dom, MD  04/02/2020 6:52 AM EDT     Please let the patient know that her vaginitis panel returned indeterminate for BV. See if she is still having symptoms of an increase in vaginal discharge, or if she has an odor. If she is having either of these symptoms then I would treat her, if not I wouldn't. Treatment is flagyl (either oral or vaginal, her choice), no ETOH while on Flagyl.  Oral: Flagyl 500 mg BID x 7 days, or Vaginal: Metrogel, 1 applicator per vagina q day x 5 days.

## 2020-05-04 NOTE — Telephone Encounter (Signed)
Patient asked if Dr.Jertson has responded to her message from earlier today?

## 2020-05-19 ENCOUNTER — Other Ambulatory Visit: Payer: Self-pay

## 2020-05-19 ENCOUNTER — Encounter: Payer: Self-pay | Admitting: Obstetrics and Gynecology

## 2020-05-19 ENCOUNTER — Ambulatory Visit: Payer: 59 | Admitting: Obstetrics and Gynecology

## 2020-05-19 ENCOUNTER — Telehealth: Payer: Self-pay | Admitting: Obstetrics and Gynecology

## 2020-05-19 VITALS — BP 100/60 | HR 66 | Ht 63.0 in | Wt 145.0 lb

## 2020-05-19 DIAGNOSIS — B373 Candidiasis of vulva and vagina: Secondary | ICD-10-CM | POA: Diagnosis not present

## 2020-05-19 DIAGNOSIS — N760 Acute vaginitis: Secondary | ICD-10-CM | POA: Diagnosis not present

## 2020-05-19 DIAGNOSIS — B9689 Other specified bacterial agents as the cause of diseases classified elsewhere: Secondary | ICD-10-CM

## 2020-05-19 DIAGNOSIS — N952 Postmenopausal atrophic vaginitis: Secondary | ICD-10-CM | POA: Diagnosis not present

## 2020-05-19 DIAGNOSIS — B3731 Acute candidiasis of vulva and vagina: Secondary | ICD-10-CM

## 2020-05-19 MED ORDER — METRONIDAZOLE 500 MG PO TABS: 500.0000 mg | ORAL_TABLET | Freq: Two times a day (BID) | ORAL | 0 refills | Status: DC

## 2020-05-19 MED ORDER — ESTRADIOL 10 MCG VA TABS
1.0000 | ORAL_TABLET | VAGINAL | 2 refills | Status: DC
Start: 2020-05-21 — End: 2020-08-25

## 2020-05-19 MED ORDER — FLUCONAZOLE 150 MG PO TABS: 150.0000 mg | ORAL_TABLET | Freq: Once | ORAL | 0 refills | Status: AC

## 2020-05-19 NOTE — Patient Instructions (Signed)
Atrophic Vaginitis  Atrophic vaginitis is a condition in which the tissues that line the vagina become dry and thin. This condition is most common in women who have stopped having regular menstrual periods (are in menopause). This usually starts when a woman is 45-53 years old. That is the time when a woman's estrogen levels begin to drop (decrease). Estrogen is a female hormone. It helps to keep the tissues of the vagina moist. It stimulates the vagina to produce a clear fluid that lubricates the vagina for sexual intercourse. This fluid also protects the vagina from infection. Lack of estrogen can cause the lining of the vagina to get thinner and dryer. The vagina may also shrink in size. It may become less elastic. Atrophic vaginitis tends to get worse over time as a woman's estrogen level drops. What are the causes? This condition is caused by the normal drop in estrogen that happens around the time of menopause. What increases the risk? Certain conditions or situations may lower a woman's estrogen level, leading to a higher risk for atrophic vaginitis. You are more likely to develop this condition if:  You are taking medicines that block estrogen.  You have had your ovaries removed.  You are being treated for cancer with X-ray (radiation) or medicines (chemotherapy).  You have given birth or are breastfeeding.  You are older than age 50.  You smoke. What are the signs or symptoms? Symptoms of this condition include:  Pain, soreness, or bleeding during sexual intercourse (dyspareunia).  Vaginal burning, irritation, or itching.  Pain or bleeding when a speculum is used in a vaginal exam (pelvic exam).  Having burning pain when passing urine.  Vaginal discharge that is brown or yellow. In some cases, there are no symptoms. How is this diagnosed? This condition is diagnosed by taking a medical history and doing a physical exam. This will include a pelvic exam that checks the  vaginal tissues. Though rare, you may also have other tests, including:  A urine test.  A test that checks the acid balance in your vagina (acid balance test). How is this treated? Treatment for this condition depends on how severe your symptoms are. Treatment may include:  Using an over-the-counter vaginal lubricant before sex.  Using a long-acting vaginal moisturizer.  Using low-dose vaginal estrogen for moderate to severe symptoms that do not respond to other treatments. Options include creams, tablets, and inserts (vaginal rings). Before you use a vaginal estrogen, tell your health care provider if you have a history of: ? Breast cancer. ? Endometrial cancer. ? Blood clots. If you are not sexually active and your symptoms are very mild, you may not need treatment. Follow these instructions at home: Medicines  Take over-the-counter and prescription medicines only as told by your health care provider. Do not use herbal or alternative medicines unless your health care provider says that you can.  Use over-the-counter creams, lubricants, or moisturizers for dryness only as directed by your health care provider. General instructions  If your atrophic vaginitis is caused by menopause, discuss all of your menopause symptoms and treatment options with your health care provider.  Do not douche.  Do not use products that can make your vagina dry. These include: ? Scented feminine sprays. ? Scented tampons. ? Scented soaps.  Vaginal intercourse can help to improve blood flow and elasticity of vaginal tissue. If it hurts to have sex, try using a lubricant or moisturizer just before having intercourse. Contact a health care provider if:    Your discharge looks different than normal.  Your vagina has an unusual smell.  You have new symptoms.  Your symptoms do not improve with treatment.  Your symptoms get worse. Summary  Atrophic vaginitis is a condition in which the tissues that  line the vagina become dry and thin. It is most common in women who have stopped having regular menstrual periods (are in menopause).  Treatment options include using vaginal lubricants and low-dose vaginal estrogen.  Contact a health care provider if your vagina has an unusual smell, or if your symptoms get worse or do not improve after treatment. This information is not intended to replace advice given to you by your health care provider. Make sure you discuss any questions you have with your health care provider. Document Revised: 09/22/2017 Document Reviewed: 07/06/2017 Elsevier Patient Education  2020 Elsevier Inc. Vaginitis Vaginitis is a condition in which the vaginal tissue swells and becomes red (inflamed). This condition is most often caused by a change in the normal balance of bacteria and yeast that live in the vagina. This change causes an overgrowth of certain bacteria or yeast, which causes the inflammation. There are different types of vaginitis, but the most common types are:  Bacterial vaginosis.  Yeast infection (candidiasis).  Trichomoniasis vaginitis. This is a sexually transmitted disease (STD).  Viral vaginitis.  Atrophic vaginitis.  Allergic vaginitis. What are the causes? The cause of this condition depends on the type of vaginitis. It can be caused by:  Bacteria (bacterial vaginosis).  Yeast, which is a fungus (yeast infection).  A parasite (trichomoniasis vaginitis).  A virus (viral vaginitis).  Low hormone levels (atrophic vaginitis). Low hormone levels can occur during pregnancy, breastfeeding, or after menopause.  Irritants, such as bubble baths, scented tampons, and feminine sprays (allergic vaginitis). Other factors can change the normal balance of the yeast and bacteria that live in the vagina. These include:  Antibiotic medicines.  Poor hygiene.  Diaphragms, vaginal sponges, spermicides, birth control pills, and intrauterine devices  (IUD).  Sex.  Infection.  Uncontrolled diabetes.  A weakened defense (immune) system. What increases the risk? This condition is more likely to develop in women who:  Smoke.  Use vaginal douches, scented tampons, or scented sanitary pads.  Wear tight-fitting pants.  Wear thong underwear.  Use oral birth control pills or an IUD.  Have sex without a condom.  Have multiple sex partners.  Have an STD.  Frequently use the spermicide nonoxynol-9.  Eat lots of foods high in sugar.  Have uncontrolled diabetes.  Have low estrogen levels.  Have a weakened immune system from an immune disorder or medical treatment.  Are pregnant or breastfeeding. What are the signs or symptoms? Symptoms vary depending on the cause of the vaginitis. Common symptoms include:  Abnormal vaginal discharge. ? The discharge is white, gray, or yellow with bacterial vaginosis. ? The discharge is thick, white, and cheesy with a yeast infection. ? The discharge is frothy and yellow or greenish with trichomoniasis.  A bad vaginal smell. The smell is fishy with bacterial vaginosis.  Vaginal itching, pain, or swelling.  Sex that is painful.  Pain or burning when urinating. Sometimes there are no symptoms. How is this diagnosed? This condition is diagnosed based on your symptoms and medical history. A physical exam, including a pelvic exam, will also be done. You may also have other tests, including:  Tests to determine the pH level (acidity or alkalinity) of your vagina.  A whiff test, to assess the odor   that results when a sample of your vaginal discharge is mixed with a potassium hydroxide solution.  Tests of vaginal fluid. A sample will be examined under a microscope. How is this treated? Treatment varies depending on the type of vaginitis you have. Your treatment may include:  Antibiotic creams or pills to treat bacterial vaginosis and trichomoniasis.  Antifungal medicines, such as  vaginal creams or suppositories, to treat a yeast infection.  Medicine to ease discomfort if you have viral vaginitis. Your sexual partner should also be treated.  Estrogen delivered in a cream, pill, suppository, or vaginal ring to treat atrophic vaginitis. If vaginal dryness occurs, lubricants and moisturizing creams may help. You may need to avoid scented soaps, sprays, or douches.  Stopping use of a product that is causing allergic vaginitis. Then using a vaginal cream to treat the symptoms. Follow these instructions at home: Lifestyle  Keep your genital area clean and dry. Avoid soap, and only rinse the area with water.  Do not douche or use tampons until your health care provider says it is okay to do so. Use sanitary pads, if needed.  Do not have sex until your health care provider approves. When you can return to sex, practice safe sex and use condoms.  Wipe from front to back. This avoids the spread of bacteria from the rectum to the vagina. General instructions  Take over-the-counter and prescription medicines only as told by your health care provider.  If you were prescribed an antibiotic medicine, take or use it as told by your health care provider. Do not stop taking or using the antibiotic even if you start to feel better.  Keep all follow-up visits as told by your health care provider. This is important. How is this prevented?  Use mild, non-scented products. Do not use things that can irritate the vagina, such as fabric softeners. Avoid the following products if they are scented: ? Feminine sprays. ? Detergents. ? Tampons. ? Feminine hygiene products. ? Soaps or bubble baths.  Let air reach your genital area. ? Wear cotton underwear to reduce moisture buildup. ? Avoid wearing underwear while you sleep. ? Avoid wearing tight pants and underwear or nylons without a cotton panel. ? Avoid wearing thong underwear.  Take off any wet clothing, such as bathing suits, as  soon as possible.  Practice safe sex and use condoms. Contact a health care provider if:  You have abdominal pain.  You have a fever.  You have symptoms that last for more than 2-3 days. Get help right away if:  You have a fever and your symptoms suddenly get worse. Summary  Vaginitis is a condition in which the vaginal tissue becomes inflamed.This condition is most often caused by a change in the normal balance of bacteria and yeast that live in the vagina.  Treatment varies depending on the type of vaginitis you have.  Do not douche, use tampons , or have sex until your health care provider approves. When you can return to sex, practice safe sex and use condoms. This information is not intended to replace advice given to you by your health care provider. Make sure you discuss any questions you have with your health care provider. Document Revised: 09/22/2017 Document Reviewed: 11/15/2016 Elsevier Patient Education  2020 Elsevier Inc.  

## 2020-05-19 NOTE — Telephone Encounter (Signed)
Patient is still having issues she would like to discuss with nurse.

## 2020-05-19 NOTE — Progress Notes (Signed)
GYNECOLOGY  VISIT   HPI: 54 y.o.   Legally Separated White or Caucasian Not Hispanic or Latino  female   [phone] with No LMP recorded. Patient has had a hysterectomy.   here for vaginal discharge she noticed that her vagina smelled like bleach. She is having vaginal dryness.  In 4/21 she was seen with vaginitis symptoms and had a negative vaginitis panel. In May she was seen at Urgent care with vaginitis symptoms and was treated with Flagyl and Diflucan.  Last month she was seen here and her vaginitis panel was indeterminate for BV so she was treated. Didn't take treatment until the beginning of July. Her vaginitis symptoms earlier this year started when she started being sexually active again.  She c/o dryness with sex and soreness after. Not using a lubricant.  Current symptoms started yesterday, slight odor, slight discharge yesterday. Slight itching last night and this morning. Last sexually active 2 days ago.   GYNECOLOGIC HISTORY: No LMP recorded. Patient has had a hysterectomy. Contraception:NA Menopausal hormone therapy: Vivelle dot         OB History    Gravida  2   Para  2   Term  1   Preterm  1   AB      Living  3     SAB      TAB      Ectopic      Multiple  1   Live Births  3              Patient Active Problem List   Diagnosis Date Noted   Greater trochanteric bursitis of right hip 08/20/2015   Fibrositis 01/29/2014   Fibromyalgia 01/29/2014   Atypical chest pain 09/02/2013   Polypharmacy 05/01/2013   Other long term (current) drug therapy 05/01/2013   Arthritis 04/16/2013   Abdominal pain, epigastric 03/26/2013   Back pain, chronic 12/05/2012   Connective tissue disease, undifferentiated ([place]) 12/05/2012   Raynaud's syndrome without gangrene 12/05/2012   Systemic lupus erythematosus ([place]) 12/05/2012   Cerebrospinal fluid leak from spinal puncture 08/24/2012   UNSPECIFIED DISORDER OF STOMACH AND DUODENUM 08/26/2008    Past  Medical History:  Diagnosis Date   Abdominal pain    Anxiety    takes Xanax daily as needed   Arthritis    Cancer ([place])    MOLE PRE   Cerebrospinal fluid leak from spinal puncture 08/24/2012   Chronic back pain    Constipation    takes Dulcolax and Miralax daily as needed;also Fiber Pills   Depression    takes Lamictal daily   Fibromyalgia    GERD (gastroesophageal reflux disease)    takes Omeprazole daily   Headache(784.0)    Heart murmur    slight   History of bronchitis    last time many yrs ago(66yrs ago)   Internal hemorrhoid    Interstitial cystitis    no problems since beginning of Jan 2014   Joint pain    Joint swelling    Lupus ([place])    takes Plaquenil daily   Muscle spasm    takes Robaxin daily as needed   Numbness and tingling in hands    Osteoarthritis    Pneumonia    hx of > 37yrs ago   Raynaud's disease    Urinary frequency    Urinary urgency     Past Surgical History:  Procedure Laterality Date   ABDOMINAL HERNIA REPAIR     APPENDECTOMY  CESAREAN SECTION     CHOLECYSTECTOMY     COLONOSCOPY     DIAGNOSTIC LAPAROSCOPY     adhesions   ENDOMETRIAL ABLATION     ESOPHAGOGASTRODUODENOSCOPY N/A 05/08/2013   Procedure: ESOPHAGOGASTRODUODENOSCOPY (EGD);  Surgeon: Rogene Houston, MD;  Location: [facility];  Service: Endoscopy;  Laterality: N/A;  315   NASAL SEPTUM SURGERY     OVARIAN CYST REMOVAL     SHOULDER SURGERY Right    x 2   SPINAL CORD STIMULATOR INSERTION N/A 06/13/2014   Procedure: LUMBAR SPINAL CORD STIMULATOR INSERTION;  Surgeon: Bonna Gains, MD;  Location: [facility];  Service: Neurosurgery;  Laterality: N/A;   SPINAL FUSION     x 2    TONSILLECTOMY     TUBAL LIGATION     uterine ablation     VAGINAL HYSTERECTOMY  8/12    Current Outpatient Medications  Medication Sig Dispense Refill   Cholecalciferol (VITAMIN D3) 5000 units CAPS Take 1 tablet by mouth daily.     estradiol  (VIVELLE-DOT) 0.075 MG/24HR Place 1 patch onto the skin 2 (two) times a week. 24 patch 2   TURMERIC PO Take by mouth daily.     No current facility-administered medications for this visit.     ALLERGIES: Amoxicillin, Penicillins, and Tegretol [carbamazepine]  Family History  Problem Relation Age of Onset   Hyperlipidemia Mother    Depression Mother    Anxiety disorder Mother    Ovarian cancer Maternal Aunt    Colon cancer Maternal Uncle    Colon cancer Paternal Aunt    Breast cancer Maternal Grandmother    Alzheimer's disease Maternal Grandmother     Social History   Socioeconomic History   Marital status: Legally Separated    Spouse name: Not on file   Number of children: Not on file   Years of education: Not on file   Highest education level: Not on file  Occupational History   Not on file  Tobacco Use   Smoking status: Former Smoker    Packs/day: 0.50    Years: 20.00    Pack years: 10.00    Types: Cigarettes    Quit date: 06/09/2008    Years since quitting: 11.9   Smokeless tobacco: Never Used  Vaping Use   Vaping Use: Never used  Substance and Sexual Activity   Alcohol use: No    Alcohol/week: 0.0 standard drinks   Drug use: No   Sexual activity: Not Currently    Partners: Male    Birth control/protection: Surgical  Other Topics Concern   Not on file  Social History Narrative   Not on file   Social Determinants of Health   Financial Resource Strain:    Difficulty of Paying Living Expenses:   Food Insecurity:    Worried About Charity fundraiser in the Last Year:    Arboriculturist in the Last Year:   Transportation Needs:    Film/video editor (Medical):    Lack of Transportation (Non-Medical):   Physical Activity:    Days of Exercise per Week:    Minutes of Exercise per Session:   Stress:    Feeling of Stress :   Social Connections:    Frequency of Communication with Friends and Family:    Frequency of  Social Gatherings with Friends and Family:    Attends Religious Services:    Active Member of Clubs or Organizations:    Attends Archivist Meetings:  Marital Status:   Intimate Partner Violence:    Fear of Current or Ex-Partner:    Emotionally Abused:    Physically Abused:    Sexually Abused:     Review of Systems  All other systems reviewed and are negative.   PHYSICAL EXAMINATION:    BP (!) 100/60    Pulse 66    Ht 5\' 3"  (1.6 m)    Wt 145 lb (65.8 kg)    SpO2 100%    BMI 25.69 kg/m     General appearance: alert, cooperative and appears stated age  Pelvic: External genitalia:  no lesions              Urethra:  normal appearing urethra with no masses, tenderness or lesions              Bartholins and Skenes: normal                 Vagina: normal appearing vagina with an increase in thick white vaginal d/c with an odor              Cervix: absent              Chaperone was present for exam.  Wet prep: ++ clue, no trich, + wbc KOH: ++ yeast PH: 5   ASSESSMENT BV and yeast Atrophic vaginitis    PLAN Treat with flagyl and diflucan Start vaginal estrogen tablets Discussed vaginal lubrications Discussed condoms to decrease risk of recurrent BV, they both get irritated with condoms.  She will call with recurrent symptoms, may need suppression

## 2020-05-19 NOTE — Telephone Encounter (Signed)
Spoke with pt. Pt was last seen on 03/26/20 for vaginal discharge and had indeterminate for BV from [place]. Pt waited for treatment due to beach vacation. Pt then was called in [facility] Rx on 7/12 due to having same sx. Pt states did treatment as prescribed and was better for 1 week.  Pt states noticed sx x 2 days ago with vaginal odor, vaginal dryness and irritation. Pt denies vaginal bleeding or UTI sx.  Pt states has new partner x 4 months now and is SA.  Pt advised to come for OV for further evaluation. Pt agreeable.  Pt scheduled for OV with Dr Talbert Nan today 7/27 at 3 pm. Pt agreeable and verbalized understanding of date and time of appt.   Routing to Dr Talbert Nan for review. Encounter closed.

## 2020-05-26 ENCOUNTER — Ambulatory Visit: Payer: 59 | Admitting: Nurse Practitioner

## 2020-05-28 ENCOUNTER — Ambulatory Visit: Payer: 59 | Admitting: Nurse Practitioner

## 2020-05-28 ENCOUNTER — Other Ambulatory Visit: Payer: Self-pay

## 2020-05-28 ENCOUNTER — Encounter: Payer: Self-pay | Admitting: Nurse Practitioner

## 2020-05-28 VITALS — BP 115/79 | HR 64 | Temp 98.3°F | Ht 63.0 in | Wt 140.6 lb

## 2020-05-28 DIAGNOSIS — H539 Unspecified visual disturbance: Secondary | ICD-10-CM

## 2020-05-28 DIAGNOSIS — B370 Candidal stomatitis: Secondary | ICD-10-CM

## 2020-05-28 DIAGNOSIS — R202 Paresthesia of skin: Secondary | ICD-10-CM | POA: Diagnosis not present

## 2020-05-28 MED ORDER — NYSTATIN 100000 UNIT/ML MT SUSP: 5.0000 mL | Freq: Four times a day (QID) | OROMUCOSAL | 0 refills | Status: DC

## 2020-05-28 NOTE — Assessment & Plan Note (Addendum)
Patient is a 54 year old female who presents to clinic today with paresthesia.  Patient is reporting increased tingling in arms and legs and fatigue in the last couple of weeks.  Symptoms are progressively getting worse, patient reports a history of lupus and has stopped taking medication for lupus since last summer.  Patient has appointment with rheumatologist in September and will keep appointment.  Paresthesia is not well controlled.  Patient has not reported any falls or headaches.  B12 lab completed.  CBC.   Follow-up as needed for worsening or uncontrolled symptoms.

## 2020-05-28 NOTE — Assessment & Plan Note (Signed)
Patient is reporting new vision loss/changes in the last couple of weeks.  Patient reports losing her sight for 45 minutes at a time especially when working on the computer.  Patient is anxious about changes in her vision. Patient has not changed any of her routine. Referral to ophthalmology was completed.  Education provided with printed handouts.

## 2020-05-28 NOTE — Patient Instructions (Addendum)
Paresthesia Patient is a 54 year old female who presents to clinic today with paresthesia.  Patient is reporting increased tingling in arms and legs and fatigue in the last couple of weeks.  Symptoms are progressively getting worse, patient reports a history of lupus and has stopped taking medication for lupus since last summer.  Patient has appointment with rheumatologist in September and will keep appointment.  Paresthesia is not well controlled.  Patient has not reported any falls or headaches.  B12 lab completed.  CBC.   Follow-up as needed for worsening or uncontrolled symptoms.  Oral yeast infection Patient is also reporting mouth pain. On assessment patient's tongue is heavily coated with oral candidiasis not well controlled, patient has not used anything to alleviate symptoms.  Patient reports white coating has been present for about a week. Patient started on oral nystatin swish and spit,  Vision changes Patient is reporting new vision loss/changes in the last couple of weeks.  Patient reports losing her sight for 45 minutes at a time especially when working on the computer.  Patient is anxious about changes in her vision. Patient has not changed any of her routine. Referral to ophthalmology was completed.  Education provided with printed handouts.    Paresthesia Paresthesia is a burning or prickling feeling. This feeling can happen in any part of the body. It often happens in the hands, arms, legs, or feet. Usually, it is not painful. In most cases, the feeling goes away in a short time and is not a sign of a serious problem. If you have paresthesia that lasts a long time, you may need to be seen by your doctor. Follow these instructions at home: Alcohol use   Do not drink alcohol if: ? Your doctor tells you not to drink. ? You are pregnant, may be pregnant, or are planning to become pregnant.  If you drink alcohol: ? Limit how much you use to:  0-1 drink a day for  women.  0-2 drinks a day for men. ? Be aware of how much alcohol is in your drink. In the [place] one drink equals one 12 oz bottle of beer (355 mL), one 5 oz glass of wine (148 mL), or one 1 oz glass of hard liquor (44 mL). Nutrition   Eat a healthy diet. This includes: ? Eating foods that have a lot of fiber in them, such as fresh fruits and vegetables, whole grains, and beans. ? Limiting foods that have a lot of fat and processed sugars in them, such as fried or sweet foods. General instructions  Take over-the-counter and prescription medicines only as told by your doctor.  Do not use any products that have nicotine or tobacco in them, such as cigarettes and e-cigarettes. If you need help quitting, ask your doctor.  If you have diabetes, work with your doctor to make sure your blood sugar stays in a healthy range.  If your feet feel numb: ? Check for redness, warmth, and swelling every day. ? Wear padded socks and comfortable shoes. These help protect your feet.  Keep all follow-up visits as told by your doctor. This is important. Contact a doctor if:  You have paresthesia that gets worse or does not go away.  Your burning or prickling feeling gets worse when you walk.  You have pain or cramps.  You feel dizzy.  You have a rash. Get help right away if you:  Feel weak.  Have trouble walking or moving.  Have problems speaking, understanding, or  seeing.  Feel confused.  Cannot control when you pee (urinate) or poop (have a bowel movement).  Lose feeling (have numbness) after an injury.  Have new weakness in an arm or leg.  Pass out (faint). Summary  Paresthesia is a burning or prickling feeling. It often happens in the hands, arms, legs, or feet.  In most cases, the feeling goes away in a short time and is not a sign of a serious problem.  If you have paresthesia that lasts a long time, you may need to be seen by your doctor. This information is not intended  to replace advice given to you by your health care provider. Make sure you discuss any questions you have with your health care provider. Document Revised: 11/05/2018 Document Reviewed: 10/19/2017 Elsevier Patient Education  2020 [place], Adult  Oral thrush is an infection in your mouth and throat. It causes white patches on your tongue and in your mouth. Follow these instructions at home: Helping with soreness   To lessen your pain: ? Drink cold liquids, like water and iced tea. ? Eat frozen ice pops or frozen juices. ? Eat foods that are easy to swallow, like gelatin and ice cream. ? Drink from a straw if the patches in your mouth are painful. General instructions  Take or use over-the-counter and prescription medicines only as told by your doctor. Medicine for oral thrush may be something to swallow, or it may be something to put on the infected area.  Eat plain yogurt that has live cultures in it. Read the label to make sure.  If you wear dentures: ? Take out your dentures before you go to bed. ? Brush them well. ? Soak them in a denture cleaner.  Rinse your mouth with warm salt-water many times a day. To make the salt-water mixture, completely dissolve 1/2-1 teaspoon of salt in 1 cup of warm water. Contact a doctor if:  Your problems are getting worse.  Your problems do not get better in less than 7 days with treatment.  Your infection is spreading. This may show as white patches on the skin outside of your mouth.  You are nursing your baby and you have redness and pain in the nipples. This information is not intended to replace advice given to you by your health care provider. Make sure you discuss any questions you have with your health care provider. Document Revised: 01/12/2018 Document Reviewed: 07/04/2016 Elsevier Patient Education  [place].

## 2020-05-28 NOTE — Progress Notes (Signed)
Established Patient Office Visit  Subjective:  Patient ID: Maureen [name], female    DOB: 07/20/1966  Age: 54 y.o. MRN: [id number]  CC:  Chief Complaint  Patient presents with   Vision Changes   Facial Burning/Tingling   Spasms    HPI Maureen [name] presents for paresthesia.    Patient is reporting increased tingling in her arms and legs and fatigue in the last couple of weeks.  Symptoms are progressively getting worse, patient reports a history of lupus and has stopped taking medication for lupus since last summer.  Patient has appointment with rheumatologist in September and will keep appointment.  Her paresthesias not well controlled.  Patient is not reporting any fever, nausea, and vomiting.  Concerning patient's yeast infection  Patient is reporting heavily coated white patch in her mouth, patient has not used anything to alleviate symptoms.  Patient reports white coating has been present for about a week.  No bleeding, or pain.  Vision changes  Patient is reporting new vision loss/changes in the last couple of weeks.  Patient reports losing her side for 45 minutes at a time especially when working on the computer.  Patient reports this incidences happen at least once or twice a week.  Patient is anxious about changes in her vision and would like to be looked at by an ophthalmologist.   Past Medical History:  Diagnosis Date   Abdominal pain    Anxiety    takes Xanax daily as needed   Arthritis    Cancer ([place])    MOLE PRE   Cerebrospinal fluid leak from spinal puncture 08/24/2012   Chronic back pain    Constipation    takes Dulcolax and Miralax daily as needed;also Fiber Pills   Depression    takes Lamictal daily   Fibromyalgia    GERD (gastroesophageal reflux disease)    takes Omeprazole daily   Headache(784.0)    Heart murmur    slight   History of bronchitis    last time many yrs ago(89yrs ago)   Internal hemorrhoid    Interstitial cystitis      no problems since beginning of Jan 2014   Joint pain    Joint swelling    Lupus ([name])    takes Plaquenil daily   Muscle spasm    takes Robaxin daily as needed   Numbness and tingling in hands    Osteoarthritis    Pneumonia    hx of > 74yrs ago   Raynaud's disease    Urinary frequency    Urinary urgency     Past Surgical History:  Procedure Laterality Date   ABDOMINAL HERNIA REPAIR     APPENDECTOMY     CESAREAN SECTION     CHOLECYSTECTOMY     COLONOSCOPY     DIAGNOSTIC LAPAROSCOPY     adhesions   ENDOMETRIAL ABLATION     ESOPHAGOGASTRODUODENOSCOPY N/A 05/08/2013   Procedure: ESOPHAGOGASTRODUODENOSCOPY (EGD);  Surgeon: Rogene Houston, MD;  Location: [facility];  Service: Endoscopy;  Laterality: N/A;  315   NASAL SEPTUM SURGERY     OVARIAN CYST REMOVAL     SHOULDER SURGERY Right    x 2   SPINAL CORD STIMULATOR INSERTION N/A 06/13/2014   Procedure: LUMBAR SPINAL CORD STIMULATOR INSERTION;  Surgeon: Bonna Gains, MD;  Location: [facility];  Service: Neurosurgery;  Laterality: N/A;   SPINAL FUSION     x 2    TONSILLECTOMY     TUBAL  LIGATION     uterine ablation     VAGINAL HYSTERECTOMY  8/12    Family History  Problem Relation Age of Onset   Hyperlipidemia Mother    Depression Mother    Anxiety disorder Mother    Ovarian cancer Maternal Aunt    Colon cancer Maternal Uncle    Colon cancer Paternal Aunt    Breast cancer Maternal Grandmother    Alzheimer's disease Maternal Grandmother     Social History   Socioeconomic History   Marital status: Legally Separated    Spouse name: Not on file   Number of children: Not on file   Years of education: Not on file   Highest education level: Not on file  Occupational History   Not on file  Tobacco Use   Smoking status: Former Smoker    Packs/day: 0.50    Years: 20.00    Pack years: 10.00    Types: Cigarettes    Quit date: 06/09/2008    Years since quitting: 11.9    Smokeless tobacco: Never Used  Vaping Use   Vaping Use: Never used  Substance and Sexual Activity   Alcohol use: No    Alcohol/week: 0.0 standard drinks   Drug use: No   Sexual activity: Not Currently    Partners: Male    Birth control/protection: Surgical  Other Topics Concern   Not on file  Social History Narrative   Not on file   Social Determinants of Health   Financial Resource Strain:    Difficulty of Paying Living Expenses:   Food Insecurity:    Worried About Charity fundraiser in the Last Year:    Arboriculturist in the Last Year:   Transportation Needs:    Film/video editor (Medical):    Lack of Transportation (Non-Medical):   Physical Activity:    Days of Exercise per Week:    Minutes of Exercise per Session:   Stress:    Feeling of Stress :   Social Connections:    Frequency of Communication with Friends and Family:    Frequency of Social Gatherings with Friends and Family:    Attends Religious Services:    Active Member of Clubs or Organizations:    Attends Music therapist:    Marital Status:   Intimate Partner Violence:    Fear of Current or Ex-Partner:    Emotionally Abused:    Physically Abused:    Sexually Abused:     Outpatient Medications Prior to Visit  Medication Sig Dispense Refill   Cholecalciferol (VITAMIN D3) 5000 units CAPS Take 1 tablet by mouth daily.     magnesium oxide (MAG-OX) 400 MG tablet Take 400 mg by mouth daily.     Omega-3 Fatty Acids (FISH OIL) 1000 MG CAPS Take by mouth.     TURMERIC PO Take by mouth daily.     estradiol (VIVELLE-DOT) 0.075 MG/24HR Place 1 patch onto the skin 2 (two) times a week. (Patient not taking: Reported on 05/28/2020) 24 patch 2   Estradiol 10 MCG TABS vaginal tablet Place 1 tablet (10 mcg total) vaginally 2 (two) times a week. (Patient not taking: Reported on 05/28/2020) 24 tablet 2   metroNIDAZOLE (FLAGYL) 500 MG tablet Take 1 tablet (500 mg total) by  mouth 2 (two) times daily. (Patient not taking: Reported on 05/28/2020) 14 tablet 0   No facility-administered medications prior to visit.    Allergies  Allergen Reactions   Amoxicillin Rash  Penicillins Rash    Has patient had a PCN reaction causing immediate rash, facial/tongue/throat swelling, SOB or lightheadedness with hypotension: yes Has patient had a PCN reaction causing severe rash involving mucus membranes or skin necrosis: yes pt did experience rash but it was not severe  Has patient had a PCN reaction that required hospitalization: no Has patient had a PCN reaction occurring within the last 10 years: no If all of the above answers are "NO", then may proceed with Cephalosporin use.    Tegretol [Carbamazepine] Rash    ROS Review of Systems  Constitutional: Positive for fatigue.  HENT: Negative.   Eyes: Negative.   Respiratory: Negative.   Cardiovascular: Negative.   Gastrointestinal: Negative.   Genitourinary: Negative.   Musculoskeletal: Negative.   Skin: Negative.       Objective:    Physical Exam Constitutional:      Appearance: Normal appearance.  HENT:     Head: Normocephalic.     Nose:     Comments: White coat on tongue   Eyes:     Conjunctiva/sclera: Conjunctivae normal.  Cardiovascular:     Rate and Rhythm: Normal rate and regular rhythm.  Pulmonary:     Effort: Pulmonary effort is normal.     Breath sounds: Normal breath sounds.  Abdominal:     General: Bowel sounds are normal.  Musculoskeletal:     Cervical back: Normal range of motion and neck supple.  Skin:    General: Skin is warm.  Neurological:     Mental Status: She is alert and oriented to person, place, and time.  Psychiatric:        Mood and Affect: Mood normal.        Behavior: Behavior normal.     BP 115/79    Pulse 64    Temp 98.3 F (36.8 C) (Temporal)    Ht 5\' 3"  (1.6 m)    Wt 140 lb 9.6 oz (63.8 kg)    BMI 24.91 kg/m  Wt Readings from Last 3 Encounters:  05/28/20  140 lb 9.6 oz (63.8 kg)  05/19/20 145 lb (65.8 kg)  03/26/20 145 lb (65.8 kg)     Health Maintenance Due  Topic Date Due   Hepatitis C Screening  Never done   COVID-19 Vaccine (1) Never done   TETANUS/TDAP  Never done   INFLUENZA VACCINE  05/24/2020    There are no preventive care reminders to display for this patient.  Lab Results  Component Value Date   TSH 1.000 02/18/2015   Lab Results  Component Value Date   WBC 3.9 06/30/2017   HGB 12.2 06/30/2017   HCT 37.2 06/30/2017   MCV 95 06/30/2017   PLT 238 06/30/2017   Lab Results  Component Value Date   NA 144 06/30/2017   K 4.3 06/30/2017   CO2 29 06/30/2017   GLUCOSE 86 06/30/2017   BUN 10 06/30/2017   CREATININE 0.78 06/30/2017   BILITOT <0.2 06/30/2017   ALKPHOS 60 06/30/2017   AST 17 06/30/2017   ALT 11 06/30/2017   PROT 6.7 06/30/2017   ALBUMIN 4.4 06/30/2017   CALCIUM 9.5 06/30/2017   ANIONGAP 11 06/09/2014      Assessment & Plan:  Paresthesia Patient is a 54 year old female who presents to clinic today with paresthesia.  Patient is reporting increased tingling in arms and legs and fatigue in the last couple of weeks.  Symptoms are progressively getting worse, patient reports a history of lupus and has stopped  taking medication for lupus since last summer.  Patient has appointment with rheumatologist in September and will keep appointment.  Paresthesia is not well controlled.  Patient has not reported any falls or headaches.  B12 lab completed.  CBC.   Follow-up as needed for worsening or uncontrolled symptoms.  Oral yeast infection Patient is also reporting mouth pain. On assessment patient's tongue is heavily coated with oral candidiasis not well controlled, patient has not used anything to alleviate symptoms.  Patient reports white coating has been present for about a week. Patient started on oral nystatin swish and spit,  Vision changes Patient is reporting new vision loss/changes in the last  couple of weeks.  Patient reports losing her sight for 45 minutes at a time especially when working on the computer.  Patient is anxious about changes in her vision. Patient has not changed any of her routine. Referral to ophthalmology was completed.  Education provided with printed handouts.  Problem List Items Addressed This Visit      Digestive   Oral yeast infection    Patient is also reporting mouth pain. On assessment patient's tongue is heavily coated with oral candidiasis not well controlled, patient has not used anything to alleviate symptoms.  Patient reports white coating has been present for about a week. Patient started on oral nystatin swish and spit,      Relevant Medications   nystatin (MYCOSTATIN) 100000 UNIT/ML suspension     Other   Vision changes    Patient is reporting new vision loss/changes in the last couple of weeks.  Patient reports losing her sight for 45 minutes at a time especially when working on the computer.  Patient is anxious about changes in her vision. Patient has not changed any of her routine. Referral to ophthalmology was completed.  Education provided with printed handouts.      Relevant Orders   Ambulatory referral to Ophthalmology   Paresthesia - Primary    Patient is a 54 year old female who presents to clinic today with paresthesia.  Patient is reporting increased tingling in arms and legs and fatigue in the last couple of weeks.  Symptoms are progressively getting worse, patient reports a history of lupus and has stopped taking medication for lupus since last summer.  Patient has appointment with rheumatologist in September and will keep appointment.  Paresthesia is not well controlled.  Patient has not reported any falls or headaches.  B12 lab completed.  CBC.   Follow-up as needed for worsening or uncontrolled symptoms.      Relevant Orders   Vitamin B12   CBC with Differential      Meds ordered this encounter  Medications    nystatin (MYCOSTATIN) 100000 UNIT/ML suspension    Sig: Take 5 mLs (500,000 Units total) by mouth 4 (four) times daily.    Dispense:  60 mL    Refill:  0    Order Specific Question:   Supervising Provider    Answer:   Caryl Pina A [[id number]]    Follow-up: Return if symptoms worsen or fail to improve.    Ivy Lynn, NP

## 2020-05-28 NOTE — Assessment & Plan Note (Signed)
Patient is also reporting mouth pain. On assessment patient's tongue is heavily coated with oral candidiasis not well controlled, patient has not used anything to alleviate symptoms.  Patient reports white coating has been present for about a week. Patient started on oral nystatin swish and spit,

## 2020-05-29 LAB — CBC WITH DIFFERENTIAL/PLATELET
Basophils Absolute: 0 10*3/uL (ref 0.0–0.2)
Basos: 1 %
EOS (ABSOLUTE): 0 10*3/uL (ref 0.0–0.4)
Eos: 1 %
Hematocrit: 39.9 % (ref 34.0–46.6)
Hemoglobin: 13.8 g/dL (ref 11.1–15.9)
Immature Grans (Abs): 0 10*3/uL (ref 0.0–0.1)
Immature Granulocytes: 0 %
Lymphocytes Absolute: 1.4 10*3/uL (ref 0.7–3.1)
Lymphs: 37 %
MCH: 32.9 pg (ref 26.6–33.0)
MCHC: 34.6 g/dL (ref 31.5–35.7)
MCV: 95 fL (ref 79–97)
Monocytes Absolute: 0.3 10*3/uL (ref 0.1–0.9)
Monocytes: 8 %
Neutrophils Absolute: 2 10*3/uL (ref 1.4–7.0)
Neutrophils: 53 %
Platelets: 273 10*3/uL (ref 150–450)
RBC: 4.19 x10E6/uL (ref 3.77–5.28)
RDW: 12.3 % (ref 11.7–15.4)
WBC: 3.9 10*3/uL (ref 3.4–10.8)

## 2020-05-29 LAB — VITAMIN B12: Vitamin B-12: 1032 pg/mL (ref 232–1245)

## 2020-08-10 ENCOUNTER — Telehealth: Payer: Self-pay

## 2020-08-10 NOTE — Telephone Encounter (Signed)
Patient seen JE- please advise on further testing.

## 2020-08-10 NOTE — Telephone Encounter (Signed)
Appointment scheduled.

## 2020-08-10 NOTE — Telephone Encounter (Signed)
Please have patient come in for evaluation, she may need some labs and scans to diagnose what exactly is going on and possible referral.   Thanks

## 2020-08-10 NOTE — Telephone Encounter (Signed)
  Incoming Patient Call  08/10/2020  What symptoms do you have? Upper back pain, she thinks it might be her liver or kidneys  How long have you been sick? For almost a year  Have you been seen for this problem? Yes she discussed this at last appt with Je she thinks she may need a scan  If your provider decides to give you a prescription, which pharmacy would you like for it to be sent to? [facility], she does not need medication just wants to talk to a nurse   Patient informed that this information will be sent to the clinical staff for review and that they should receive a follow up call.

## 2020-08-11 ENCOUNTER — Ambulatory Visit: Payer: 59 | Admitting: Family Medicine

## 2020-08-11 ENCOUNTER — Other Ambulatory Visit: Payer: Self-pay

## 2020-08-11 ENCOUNTER — Encounter: Payer: Self-pay | Admitting: Family Medicine

## 2020-08-11 VITALS — BP 109/75 | HR 67 | Temp 98.2°F | Ht 63.0 in | Wt 145.6 lb

## 2020-08-11 DIAGNOSIS — R399 Unspecified symptoms and signs involving the genitourinary system: Secondary | ICD-10-CM

## 2020-08-11 DIAGNOSIS — R1011 Right upper quadrant pain: Secondary | ICD-10-CM

## 2020-08-11 DIAGNOSIS — M545 Low back pain, unspecified: Secondary | ICD-10-CM | POA: Diagnosis not present

## 2020-08-11 LAB — MICROSCOPIC EXAMINATION
Bacteria, UA: NONE SEEN
RBC, Urine: NONE SEEN /hpf (ref 0–2)

## 2020-08-11 LAB — URINALYSIS, COMPLETE
Bilirubin, UA: NEGATIVE
Glucose, UA: NEGATIVE
Ketones, UA: NEGATIVE
Leukocytes,UA: NEGATIVE
Nitrite, UA: NEGATIVE
Protein,UA: NEGATIVE
Specific Gravity, UA: 1.01 (ref 1.005–1.030)
Urobilinogen, Ur: 0.2 mg/dL (ref 0.2–1.0)
pH, UA: 6.5 (ref 5.0–7.5)

## 2020-08-11 NOTE — Addendum Note (Signed)
Addended by: Earlene Plater on: 08/11/2020 04:36 PM   Modules accepted: Orders

## 2020-08-11 NOTE — Progress Notes (Signed)
Subjective:  Patient ID: [name], female    DOB: 09-23-1966  Age: 54 y.o. MRN: [id number]  CC: Back Pain (Mid Right, Intermittent)   HPI LIND AUSLEY presents for moderate pain in the right upper lumbar region ongoing for about a year and a half intermittently.  She says this does not feel like her usual back pain.  She says she knows back pain and this is not it.  She has chronic back pain from many years ago.  She has had a spinal stimulator and she has had 2 fusion surgeries in the L4-S1 region.  She is concerned that this pain may represent a kidney or liver problems it occurs over the posterior area waiting where they might be.  She also says she is having some frequency dysuria as well.  Urinalysis was obtained she has no fever chills or sweats.  No nausea vomiting diarrhea.  No abdominal or chest pain.  She rates the pain at a 5/10 it is throbbing with some sharp twinges.  It is not affected by activity to her knowledge and it is affected and made worse by eating tomatoes and other spicy things.  Depression screen [facility] 2/9 08/11/2020 05/28/2020 07/22/2019  Decreased Interest 0 0 0  Down, Depressed, Hopeless 0 0 0  PHQ - 2 Score 0 0 0  Altered sleeping - - -  Tired, decreased energy - - -  Change in appetite - - -  Feeling bad or failure about yourself  - - -  Trouble concentrating - - -  Moving slowly or fidgety/restless - - -  Suicidal thoughts - - -  PHQ-9 Score - - -    History Rylea has a past medical history of Abdominal pain, Anxiety, Arthritis, Cancer ([facility]), Cerebrospinal fluid leak from spinal puncture (08/24/2012), Chronic back pain, Constipation, Depression, Fibromyalgia, GERD (gastroesophageal reflux disease), Headache(784.0), Heart murmur, History of bronchitis, Internal hemorrhoid, Interstitial cystitis, Joint pain, Joint swelling, Lupus ([facility]), Muscle spasm, Numbness and tingling in hands, Osteoarthritis, Pneumonia, Raynaud's disease, Urinary frequency, and Urinary  urgency.   She has a past surgical history that includes Spinal fusion; Abdominal hernia repair; Cholecystectomy; uterine ablation; Vaginal hysterectomy (8/12); Tonsillectomy; Cesarean section; Shoulder surgery (Right); Esophagogastroduodenoscopy (N/A, 05/08/2013); Colonoscopy; Appendectomy; Ovarian cyst removal; Diagnostic laparoscopy; Spinal cord stimulator insertion (N/A, 06/13/2014); Endometrial ablation; Tubal ligation; and Nasal septum surgery.   Her family history includes Alzheimer's disease in her maternal grandmother; Anxiety disorder in her mother; Breast cancer in her maternal grandmother; Colon cancer in her maternal uncle and paternal aunt; Depression in her mother; Hyperlipidemia in her mother; Ovarian cancer in her maternal aunt.She reports that she quit smoking about 12 years ago. Her smoking use included cigarettes. She has a 10.00 pack-year smoking history. She has never used smokeless tobacco. She reports that she does not drink alcohol and does not use drugs.    ROS Review of Systems  Constitutional: Negative.   HENT: Negative.   Eyes: Negative for visual disturbance.  Respiratory: Negative for shortness of breath.   Cardiovascular: Negative for chest pain.  Gastrointestinal: Negative for abdominal pain.  Musculoskeletal: Positive for arthralgias and back pain.    Objective:  BP 109/75   Pulse 67   Temp 98.2 F (36.8 C) (Temporal)   Ht 5\' 3"  (1.6 m)   Wt 145 lb 9.6 oz (66 kg)   BMI 25.79 kg/m   BP Readings from Last 3 Encounters:  08/11/20 109/75  05/28/20 115/79  05/19/20 (!) 100/60  Wt Readings from Last 3 Encounters:  08/11/20 145 lb 9.6 oz (66 kg)  05/28/20 140 lb 9.6 oz (63.8 kg)  05/19/20 145 lb (65.8 kg)     Physical Exam Constitutional:      General: She is not in acute distress.    Appearance: She is well-developed.  HENT:     Head: Normocephalic and atraumatic.  Eyes:     Conjunctiva/sclera: Conjunctivae normal.     Pupils: Pupils are  equal, round, and reactive to light.  Cardiovascular:     Rate and Rhythm: Normal rate and regular rhythm.     Heart sounds: Normal heart sounds. No murmur heard.   Pulmonary:     Effort: Pulmonary effort is normal. No respiratory distress.     Breath sounds: Normal breath sounds. No wheezing or rales.  Abdominal:     General: Bowel sounds are normal. There is no distension.     Palpations: Abdomen is soft.     Tenderness: There is no abdominal tenderness.  Musculoskeletal:        General: Tenderness (right uper lumbar region laterally over the rean fossa.  ) present.     Cervical back: Normal range of motion and neck supple.     Lumbar back: Spasms and tenderness present. No deformity. Decreased range of motion.  Skin:    General: Skin is warm and dry.  Neurological:     Mental Status: She is alert and oriented to person, place, and time.     Deep Tendon Reflexes: Reflexes are normal and symmetric.  Psychiatric:        Behavior: Behavior normal.        Thought Content: Thought content normal.       Assessment & Plan:   Dalisa was seen today for back pain.  Diagnoses and all orders for this visit:  UTI symptoms -     Urine Culture; Future -     Urinalysis, Complete       I have discontinued [name] R. Kensinger's metroNIDAZOLE and nystatin. I am also having her maintain her Vitamin D3, TURMERIC PO, estradiol, Estradiol, Fish Oil, and magnesium oxide.  Allergies as of 08/11/2020      Reactions   Amoxicillin Rash   Penicillins Rash   Has patient had a PCN reaction causing immediate rash, facial/tongue/throat swelling, SOB or lightheadedness with hypotension: yes Has patient had a PCN reaction causing severe rash involving mucus membranes or skin necrosis: yes pt did experience rash but it was not severe  Has patient had a PCN reaction that required hospitalization: no Has patient had a PCN reaction occurring within the last 10 years: no If all of the above answers are  "NO", then may proceed with Cephalosporin use.   Tegretol [carbamazepine] Rash      Medication List       Accurate as of August 11, 2020  4:19 PM. If you have any questions, ask your nurse or doctor.        STOP taking these medications   metroNIDAZOLE 500 MG tablet Commonly known as: FLAGYL Stopped by: Claretta Fraise, MD   nystatin 100000 UNIT/ML suspension Commonly known as: MYCOSTATIN Stopped by: Claretta Fraise, MD     TAKE these medications   estradiol 0.075 MG/24HR Commonly known as: VIVELLE-DOT Place 1 patch onto the skin 2 (two) times a week.   Estradiol 10 MCG Tabs vaginal tablet Place 1 tablet (10 mcg total) vaginally 2 (two) times a week.   Fish Oil 1000  MG Caps Take by mouth.   magnesium oxide 400 MG tablet Commonly known as: MAG-OX Take 400 mg by mouth daily.   TURMERIC PO Take by mouth daily.   Vitamin D3 125 MCG (5000 UT) Caps Take 1 tablet by mouth daily.        Follow-up: No follow-ups on file.  Claretta Fraise, M.D.

## 2020-08-12 LAB — CMP14+EGFR
ALT: 17 IU/L (ref 0–32)
AST: 16 IU/L (ref 0–40)
Albumin/Globulin Ratio: 2 (ref 1.2–2.2)
Albumin: 4.3 g/dL (ref 3.8–4.9)
Alkaline Phosphatase: 54 IU/L (ref 44–121)
BUN/Creatinine Ratio: 16 (ref 9–23)
BUN: 10 mg/dL (ref 6–24)
Bilirubin Total: 0.3 mg/dL (ref 0.0–1.2)
CO2: 29 mmol/L (ref 20–29)
Calcium: 9 mg/dL (ref 8.7–10.2)
Chloride: 98 mmol/L (ref 96–106)
Creatinine, Ser: 0.64 mg/dL (ref 0.57–1.00)
GFR calc Af Amer: 118 mL/min/{1.73_m2} (ref >59)
GFR calc non Af Amer: 102 mL/min/{1.73_m2} (ref >59)
Globulin, Total: 2.1 g/dL (ref 1.5–4.5)
Glucose: 87 mg/dL (ref 65–99)
Potassium: 4.2 mmol/L (ref 3.5–5.2)
Sodium: 138 mmol/L (ref 134–144)
Total Protein: 6.4 g/dL (ref 6.0–8.5)

## 2020-08-12 LAB — CBC WITH DIFFERENTIAL/PLATELET
Basophils Absolute: 0 10*3/uL (ref 0.0–0.2)
Basos: 1 %
EOS (ABSOLUTE): 0 10*3/uL (ref 0.0–0.4)
Eos: 1 %
Hematocrit: 37.8 % (ref 34.0–46.6)
Hemoglobin: 13.2 g/dL (ref 11.1–15.9)
Immature Grans (Abs): 0 10*3/uL (ref 0.0–0.1)
Immature Granulocytes: 0 %
Lymphocytes Absolute: 1.2 10*3/uL (ref 0.7–3.1)
Lymphs: 36 %
MCH: 32.9 pg (ref 26.6–33.0)
MCHC: 34.9 g/dL (ref 31.5–35.7)
MCV: 94 fL (ref 79–97)
Monocytes Absolute: 0.3 10*3/uL (ref 0.1–0.9)
Monocytes: 8 %
Neutrophils Absolute: 1.8 10*3/uL (ref 1.4–7.0)
Neutrophils: 54 %
Platelets: 246 10*3/uL (ref 150–450)
RBC: 4.01 x10E6/uL (ref 3.77–5.28)
RDW: 12.4 % (ref 11.7–15.4)
WBC: 3.3 10*3/uL — ABNORMAL LOW (ref 3.4–10.8)

## 2020-08-12 LAB — AMYLASE: Amylase: 46 U/L (ref 31–110)

## 2020-08-12 LAB — LIPASE: Lipase: 26 U/L (ref 14–72)

## 2020-08-12 NOTE — Progress Notes (Signed)
Hello [name], ° °Your lab result is normal and/or stable.Some minor variations that are not significant are commonly marked abnormal, but do not represent any medical problem for you. ° °Best regards, °Draycen Leichter, M.D.

## 2020-08-14 LAB — URINE CULTURE

## 2020-08-16 NOTE — Progress Notes (Signed)
Hello Masae, ° °Your lab result is normal and/or stable.Some minor variations that are not significant are commonly marked abnormal, but do not represent any medical problem for you. ° °Best regards, °Mildreth Reek, M.D.

## 2020-08-25 ENCOUNTER — Telehealth: Payer: Self-pay

## 2020-08-25 MED ORDER — ESTRADIOL 10 MCG VA TABS: 1.0000 | ORAL_TABLET | VAGINAL | 1 refills | Status: DC

## 2020-08-25 NOTE — Telephone Encounter (Signed)
Patient's refill for yuvafem was denied.

## 2020-08-25 NOTE — Telephone Encounter (Signed)
Spoke with pt. Pt states needing refills for Yuvafem(estradiol tablets)  [facility]. Pt requesting 3 month supply until AEX. AEX scheduled for 12/17/20. [facility] and pt states almost out and did not [facility].   Sent # 24 tablets, 1 RF to get to [facility] to [facility].  Routing to Dr Talbert Nan for update and review.  Encounter closed

## 2020-08-28 ENCOUNTER — Telehealth: Payer: Self-pay

## 2020-08-28 ENCOUNTER — Other Ambulatory Visit: Payer: Self-pay

## 2020-08-28 NOTE — Telephone Encounter (Signed)
Called [employer] to change auth for CT from w & w/out - spoke to [employer]. - was not able to change auth as case number through insurance did not have matching ordering provider ([facility]) - I gave information for all providers in our office and per Thurmond Butts not matched the ordering provider they had on the case.  I have spoken to Tarren at [facility] and she will hold appt for patient until 930 AM before canceling - she is not able to hold any longer.  Patient is aware that imaging for Monday may need to be r/s

## 2020-08-28 NOTE — Progress Notes (Signed)
Error

## 2020-08-28 NOTE — Telephone Encounter (Signed)
Tareen with [facility] called and states that auth for CT abd/pelvis is for w/ & w/o contrast while the order is only w/o contrast. She needs to know which is correct and new auth if order is correct.  Please review and call her at [phone].

## 2020-08-31 ENCOUNTER — Ambulatory Visit
Admission: RE | Admit: 2020-08-31 | Discharge: 2020-08-31 | Disposition: A | Discharge status: Home / Self Care | Payer: 59 | Source: Ambulatory Visit | Attending: Family Medicine | Admitting: Family Medicine

## 2020-08-31 ENCOUNTER — Other Ambulatory Visit: Payer: Self-pay

## 2020-08-31 DIAGNOSIS — R1011 Right upper quadrant pain: Secondary | ICD-10-CM

## 2020-09-03 ENCOUNTER — Other Ambulatory Visit: Payer: Self-pay | Admitting: Family

## 2020-09-03 ENCOUNTER — Ambulatory Visit (HOSPITAL_COMMUNITY): Payer: 59

## 2020-09-24 ENCOUNTER — Telehealth: Payer: Self-pay | Admitting: Family

## 2020-09-24 NOTE — Telephone Encounter (Signed)
Patient aware.  No available appts for today 12/2 or tomorrow 12/3.  Patient states she will go to urgent care if worsens.

## 2020-09-24 NOTE — Telephone Encounter (Signed)
Pt thinks she broke toe and would like to get an xray

## 2020-09-24 NOTE — Telephone Encounter (Signed)
Please make her an appointment

## 2020-09-26 IMAGING — MG DIGITAL SCREENING BILAT W/ TOMO W/ CAD
8 series · 9 of 24 positions shown · non-contrast
Comparison: Previous exam(s).

CLINICAL DATA: Screening.

EXAM:
DIGITAL SCREENING BILATERAL MAMMOGRAM WITH TOMO AND CAD

[R CC synth-2D]
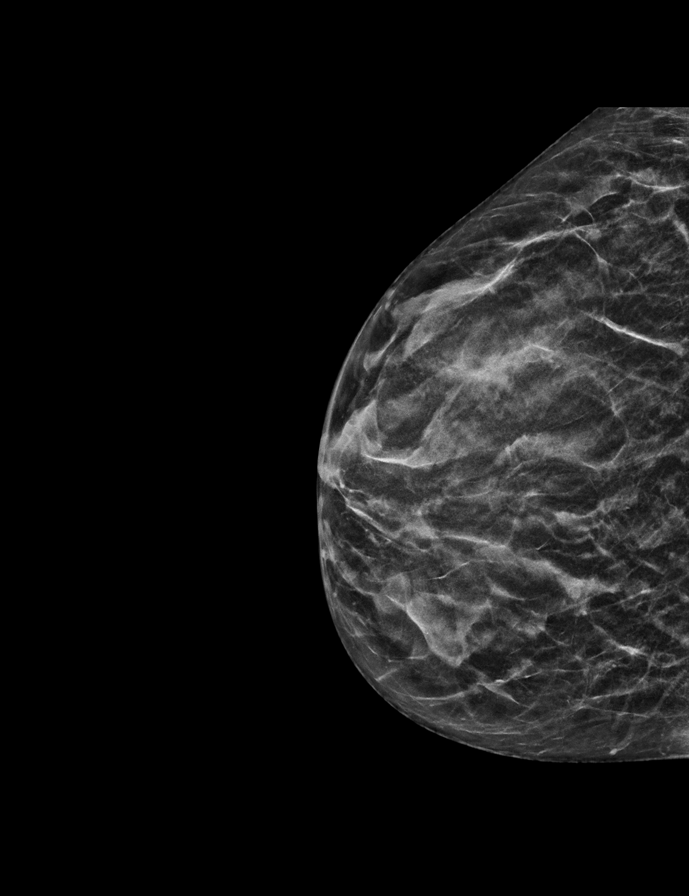

[L CC synth-2D]
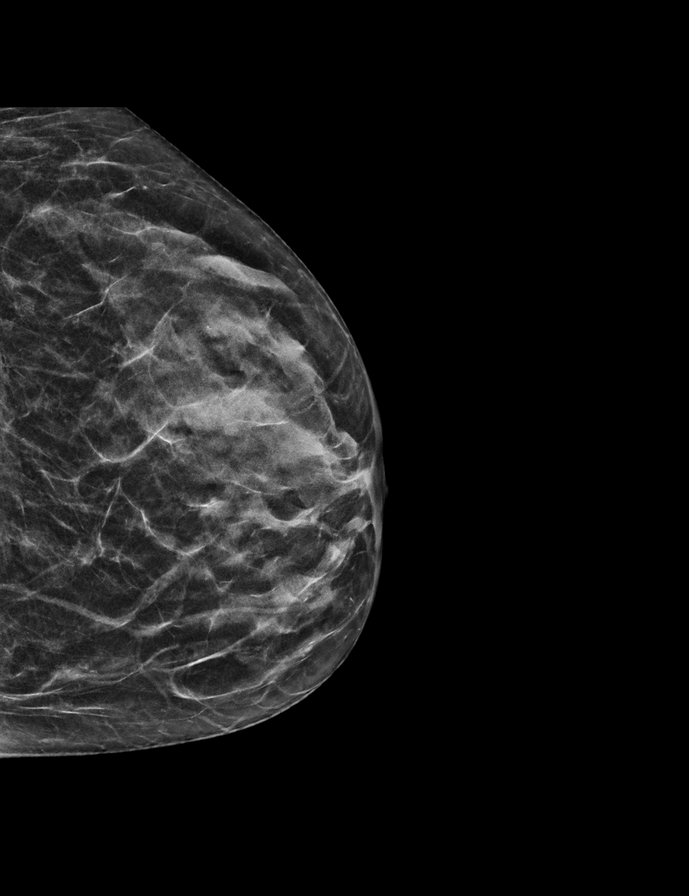

[L MLO synth-2D]
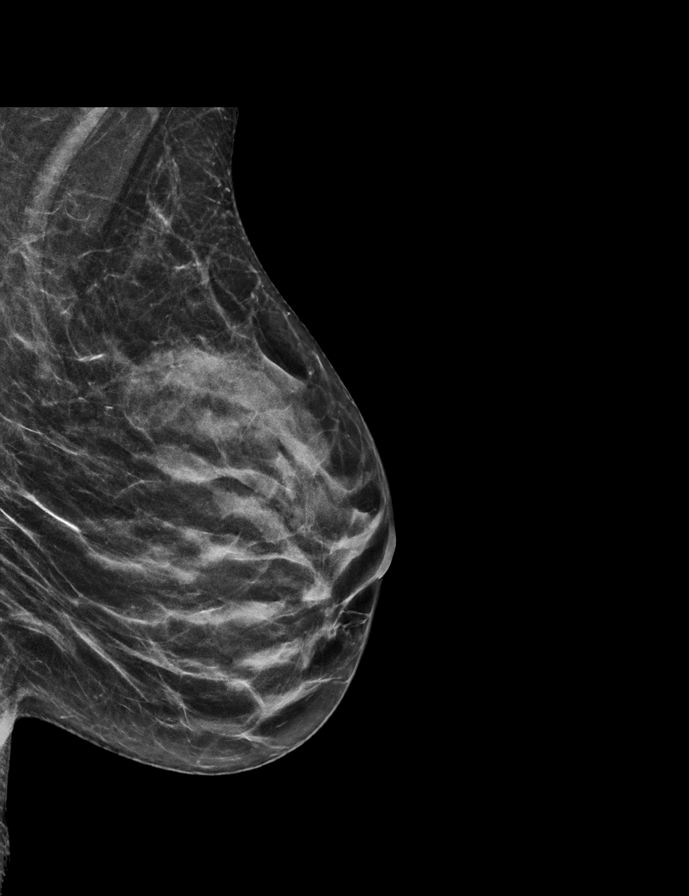

[R MLO synth-2D]
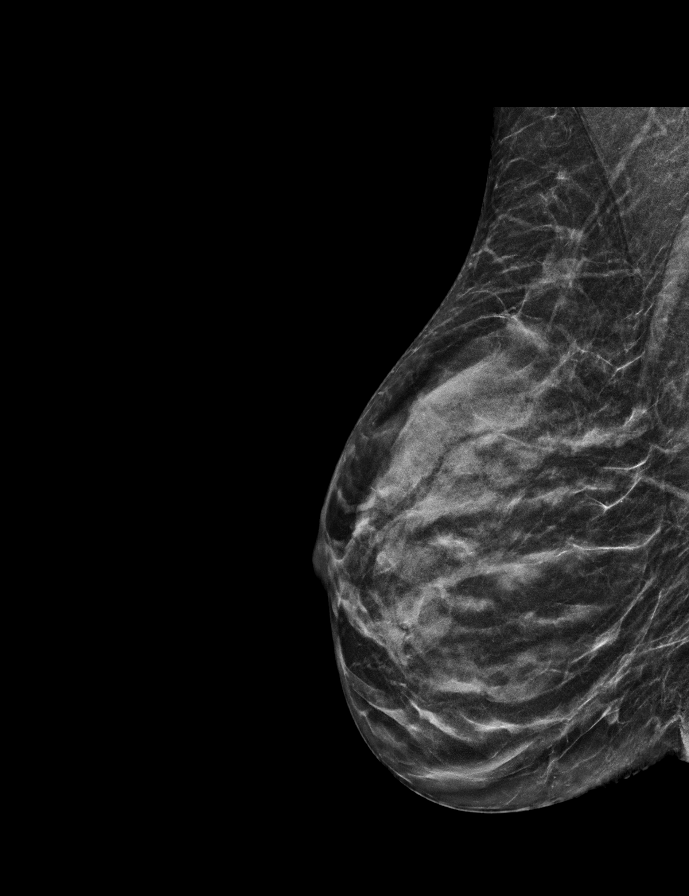

[L MLO tomo · 2 of 48 frames shown]
[frame 16/48]
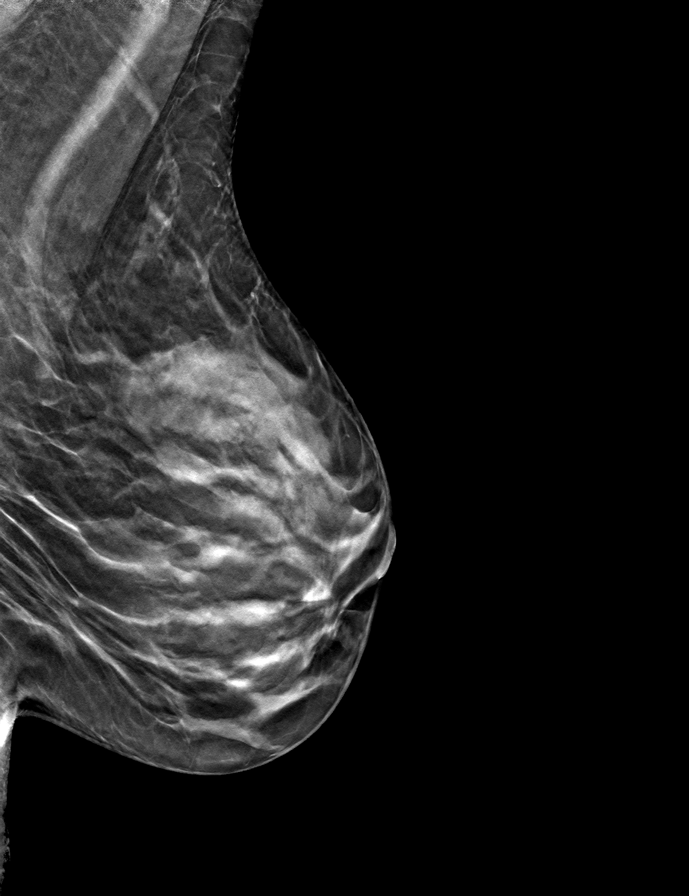
[frame 25/48]
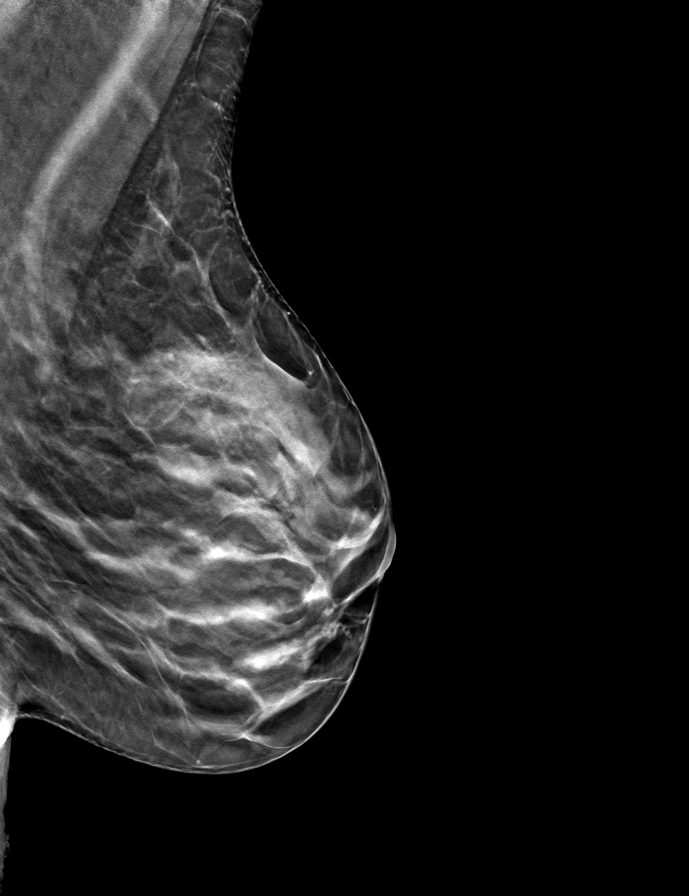

[R CC tomo · tomo slice 21/40.0]
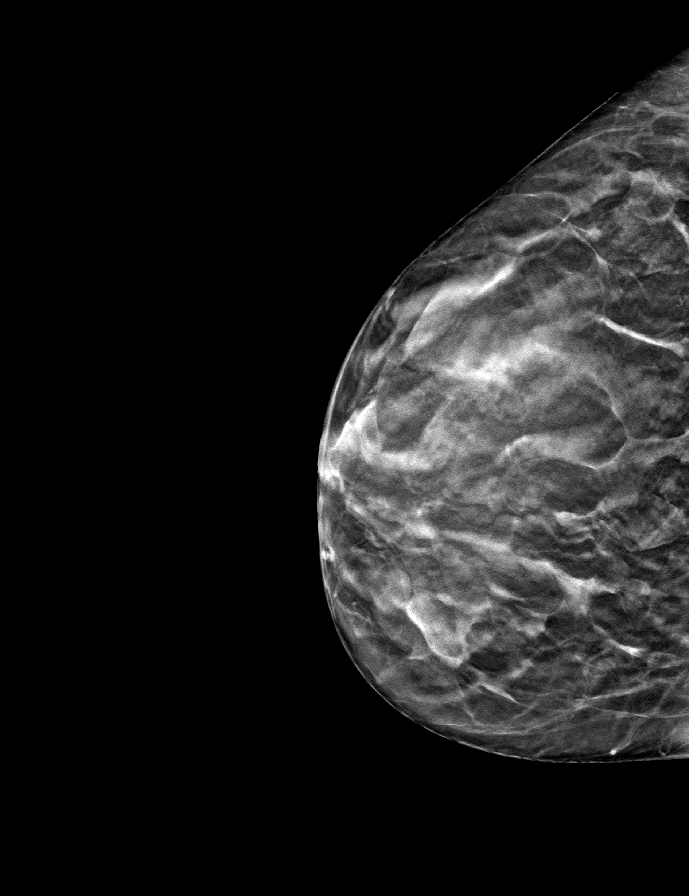

[R MLO tomo · tomo slice 23/44.0]
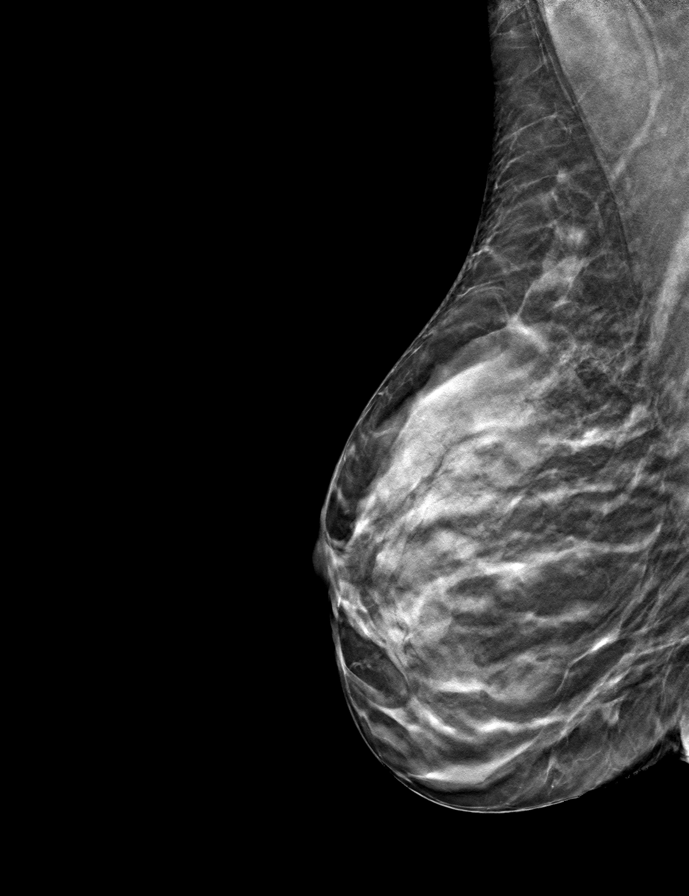

[L CC tomo · tomo slice 22/43.0]
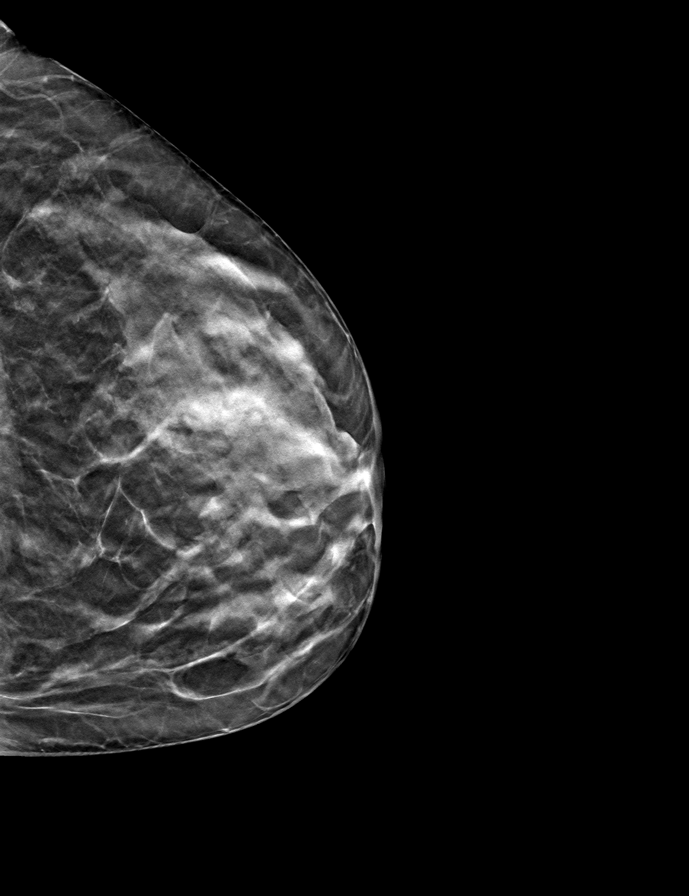

[9 of 24 positions shown; findings below may reference images not displayed]

ACR Breast Density Category c: The breast tissue is heterogeneously
dense, which may obscure small masses.
FINDINGS: In the right breast, a possible mass warrants further evaluation.
This possible mass is seen within the inner RIGHT breast, at
anterior depth, CC view only, slice 27.

In the left breast, no findings suspicious for malignancy. Images
were processed with CAD.
IMPRESSION: Further evaluation is suggested for possible mass in the right
breast.

RECOMMENDATION:
Diagnostic mammogram and possibly ultrasound of the right breast.
(Code:X9-[id number])

The patient will be contacted regarding the findings, and additional
imaging will be scheduled.

BI-RADS CATEGORY  0: Incomplete. Need additional imaging evaluation
and/or prior mammograms for comparison.

## 2020-11-26 ENCOUNTER — Ambulatory Visit (INDEPENDENT_AMBULATORY_CARE_PROVIDER_SITE_OTHER): Payer: 59 | Admitting: Family

## 2020-11-26 ENCOUNTER — Ambulatory Visit: Payer: 59 | Admitting: Family

## 2020-11-26 ENCOUNTER — Encounter: Payer: Self-pay | Admitting: Family

## 2020-11-26 DIAGNOSIS — J019 Acute sinusitis, unspecified: Secondary | ICD-10-CM | POA: Diagnosis not present

## 2020-11-26 MED ORDER — HYDROCHLOROTHIAZIDE 12.5 MG PO TABS: 12.5000 mg | ORAL_TABLET | Freq: Every day | ORAL | 3 refills | Status: DC

## 2020-11-26 MED ORDER — DOXYCYCLINE HYCLATE 100 MG PO TABS: 100.0000 mg | ORAL_TABLET | Freq: Two times a day (BID) | ORAL | 0 refills | Status: DC

## 2020-11-26 NOTE — Progress Notes (Signed)
   Virtual Visit via telephone Note Due to COVID-19 pandemic this visit was conducted virtually. This visit type was conducted due to national recommendations for restrictions regarding the COVID-19 Pandemic (e.g. social distancing, sheltering in place) in an effort to limit this patient's exposure and mitigate transmission in our community. All issues noted in this document were discussed and addressed.  A physical exam was not performed with this format.  I connected with Maureen [name] on 11/26/20 at 1:22 pm  by telephone and verified that I am speaking with the correct person using two identifiers. Maureen [name] is currently located at restaurant  and no one is currently with her during visit. The provider, Evelina Dun, FNP is located in their office at time of visit.  I discussed the limitations, risks, security and privacy concerns of performing an evaluation and management service by telephone and the availability of in person appointments. I also discussed with the patient that there may be a patient responsible charge related to this service. The patient expressed understanding and agreed to proceed.   History and Present Illness:  Pt calls the office today with sinus issues on going for a months. She reports she has taken 8 at home COVID tests and they have all been negative.   Sinusitis This is a new problem. The current episode started more than 1 month ago. The problem has been waxing and waning since onset. Her pain is at a severity of 4/10. The pain is moderate. Associated symptoms include congestion, coughing, headaches, neck pain, sinus pressure, sneezing, a sore throat and swollen glands. Pertinent negatives include no shortness of breath. Past treatments include oral decongestants. The treatment provided mild relief.     Review of Systems  HENT: Positive for congestion, sinus pressure, sneezing and sore throat.   Respiratory: Positive for cough. Negative for shortness of  breath.   Musculoskeletal: Positive for neck pain.  Neurological: Positive for headaches.     Observations/Objective: No SOB or distress note   Assessment and Plan: 1. Acute sinusitis, recurrence not specified, unspecified location - Take meds as prescribed - Use a cool mist humidifier  -Use saline nose sprays frequently -Force fluids -For any cough or congestion  Use plain Mucinex- regular strength or max strength is fine -For fever or aces or pains- take tylenol or ibuprofen. -Throat lozenges if help -RTO if symptoms worsen or do not improve  - doxycycline (VIBRA-TABS) 100 MG tablet; Take 1 tablet (100 mg total) by mouth 2 (two) times daily.  Dispense: 20 tablet; Refill: 0    I discussed the assessment and treatment plan with the patient. The patient was provided an opportunity to ask questions and all were answered. The patient agreed with the plan and demonstrated an understanding of the instructions.   The patient was advised to call back or seek an in-person evaluation if the symptoms worsen or if the condition fails to improve as anticipated.  The above assessment and management plan was discussed with the patient. The patient verbalized understanding of and has agreed to the management plan. Patient is aware to call the clinic if symptoms persist or worsen. Patient is aware when to return to the clinic for a follow-up visit. Patient educated on when it is appropriate to go to the emergency department.   Time call ended:   1:33 pm   I provided 11 minutes of non-face-to-face time during this encounter.    Evelina Dun, FNP

## 2020-12-04 ENCOUNTER — Other Ambulatory Visit: Payer: Self-pay | Admitting: Obstetrics and Gynecology

## 2020-12-08 ENCOUNTER — Other Ambulatory Visit: Payer: Self-pay | Admitting: Obstetrics and Gynecology

## 2020-12-08 NOTE — Telephone Encounter (Signed)
Medication refill request: vivelle- dot  Last AEX:  12-18-19 JJ  Next AEX: 12-17-20 Last MMG (if hormonal medication request): 02-28-20 density c/birads 2 benign  Refill authorized: Today, please advise.   Medication pended for #8, 0RF. Please refill if appropriate.

## 2020-12-17 ENCOUNTER — Ambulatory Visit (INDEPENDENT_AMBULATORY_CARE_PROVIDER_SITE_OTHER): Payer: 59 | Admitting: Obstetrics and Gynecology

## 2020-12-17 ENCOUNTER — Encounter: Payer: Self-pay | Admitting: Obstetrics and Gynecology

## 2020-12-17 VITALS — BP 110/72 | HR 77 | Resp 16 | Ht 63.0 in | Wt 147.0 lb

## 2020-12-17 DIAGNOSIS — N760 Acute vaginitis: Secondary | ICD-10-CM

## 2020-12-17 DIAGNOSIS — Z01419 Encounter for gynecological examination (general) (routine) without abnormal findings: Secondary | ICD-10-CM | POA: Diagnosis not present

## 2020-12-17 DIAGNOSIS — Z7189 Other specified counseling: Secondary | ICD-10-CM | POA: Diagnosis not present

## 2020-12-17 DIAGNOSIS — N952 Postmenopausal atrophic vaginitis: Secondary | ICD-10-CM | POA: Diagnosis not present

## 2020-12-17 LAB — WET PREP FOR TRICH, YEAST, CLUE

## 2020-12-17 MED ORDER — ESTRADIOL 0.075 MG/24HR TD PTTW: MEDICATED_PATCH | TRANSDERMAL | 3 refills | Status: DC

## 2020-12-17 MED ORDER — ESTRADIOL 10 MCG VA TABS: 1.0000 | ORAL_TABLET | VAGINAL | 3 refills | Status: DC

## 2020-12-17 NOTE — Patient Instructions (Signed)

## 2020-12-17 NOTE — Progress Notes (Signed)
55 y.o. [id number] Legally Separated White or Caucasian Not Hispanic or Latino female here for annual exam.  Patient states that she is having vaginal irritation. She states that she had had white discharge and pain with sex.  She c/o a 1.5 week h/o vaginal irritation, dryness, white vaginal d/c and pain with intercourse. She has been taking probiotics, symptoms are a little better but still bothering her. She hasn't been consistent in her vaginal estrogen. Plans to take regularly. Doesn't typically have pain with intercourse.   Same partner as last time, not STD concerns.   H/o hysterectomy, on transdermal estrogen, doing well. Wants to continue. Aware of risks.  She was started on vaginal estrogen last summer.   H/O IC, helped with estrogen. Still has issues, not as severe.   H/O lupus like connective tissue disorder.     No LMP recorded. Patient has had a hysterectomy.          Sexually active: Yes.    The current method of family planning is status post hysterectomy.    Exercising: Yes.    Walking Smoker:  no  Health Maintenance: Pap: 2012 WNL per patient History of abnormal Pap:  no MMG:  02/28/20 Diagnostic right ultrasound  BMD:   None  Colonoscopy: 08-19-14 WNL -repeat in 10 years  TDaP:  Unsure  Gardasil: NA   reports that she quit smoking about 12 years ago. Her smoking use included cigarettes. She has a 10.00 pack-year smoking history. She has never used smokeless tobacco. She reports that she does not drink alcohol and does not use drugs. 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 3 grown kids.   Past Medical History:  Diagnosis Date  . Abdominal pain   . Anxiety    takes Xanax daily as needed  . Arthritis   . Cancer ([place])    MOLE PRE  . Cerebrospinal fluid leak from spinal puncture 08/24/2012  . Chronic back pain   . Constipation    takes Dulcolax and Miralax daily as needed;also Fiber Pills  . Depression    takes Lamictal daily  . Fibromyalgia   . GERD  (gastroesophageal reflux disease)    takes Omeprazole daily  . Headache(784.0)   . Heart murmur    slight  . History of bronchitis    last time many yrs ago(80yrs ago)  . Internal hemorrhoid   . Interstitial cystitis    no problems since beginning of Jan 2014  . Joint pain   . Joint swelling   . Lupus ([place])    takes Plaquenil daily  . Muscle spasm    takes Robaxin daily as needed  . Numbness and tingling in hands   . Osteoarthritis   . Pneumonia    hx of > 70yrs ago  . Raynaud's disease   . Urinary frequency   . Urinary urgency     Past Surgical History:  Procedure Laterality Date  . ABDOMINAL HERNIA REPAIR    . APPENDECTOMY    . CESAREAN SECTION    . CHOLECYSTECTOMY    . COLONOSCOPY    . DIAGNOSTIC LAPAROSCOPY     adhesions  . ENDOMETRIAL ABLATION    . ESOPHAGOGASTRODUODENOSCOPY N/A 05/08/2013   Procedure: ESOPHAGOGASTRODUODENOSCOPY (EGD);  Surgeon: Rogene Houston, MD;  Location: [facility];  Service: Endoscopy;  Laterality: N/A;  315  . NASAL SEPTUM SURGERY    . OVARIAN CYST REMOVAL    . SHOULDER SURGERY Right    x 2  . SPINAL  CORD STIMULATOR INSERTION N/A 06/13/2014   Procedure: LUMBAR SPINAL CORD STIMULATOR INSERTION;  Surgeon: Bonna Gains, MD;  Location: [facility];  Service: Neurosurgery;  Laterality: N/A;  . SPINAL FUSION     x 2   . TONSILLECTOMY    . TUBAL LIGATION    . uterine ablation    . VAGINAL HYSTERECTOMY  8/12    Current Outpatient Medications  Medication Sig Dispense Refill  . Cholecalciferol (VITAMIN D3) 5000 units CAPS Take 1 tablet by mouth daily.    . Estradiol 10 MCG TABS vaginal tablet Place 1 tablet (10 mcg total) vaginally 2 (two) times a week. 24 tablet 1  . hydrochlorothiazide (HYDRODIURIL) 12.5 MG tablet Take 1 tablet (12.5 mg total) by mouth daily. 90 tablet 3  . magnesium oxide (MAG-OX) 400 MG tablet Take 400 mg by mouth daily.    . Omega-3 Fatty Acids (FISH OIL) 1000 MG CAPS Take by mouth.    . TURMERIC PO Take by mouth  daily.    Marland Kitchen VIVELLE-DOT 0.075 MG/24HR APPLY 1 PATCH TOPICALLY TO  SKIN TWICE WEEKLY 8 patch 0   No current facility-administered medications for this visit.    Family History  Problem Relation Age of Onset  . Hyperlipidemia Mother   . Depression Mother   . Anxiety disorder Mother   . Ovarian cancer Maternal Aunt   . Colon cancer Maternal Uncle   . Colon cancer Paternal Aunt   . Breast cancer Maternal Grandmother   . Alzheimer's disease Maternal Grandmother     Review of Systems  Genitourinary: Positive for vaginal discharge and vaginal pain.  All other systems reviewed and are negative.   Exam:   BP 110/72   Pulse 77   Resp 16   Ht 5\' 3"  (1.6 m)   Wt 147 lb (66.7 kg)   BMI 26.04 kg/m   Weight change: @WEIGHTCHANGE @ Height:   Height: 5\' 3"  (160 cm)  Ht Readings from Last 3 Encounters:  12/17/20 5\' 3"  (1.6 m)  08/11/20 5\' 3"  (1.6 m)  05/28/20 5\' 3"  (1.6 m)    General appearance: alert, cooperative and appears stated age Head: Normocephalic, without obvious abnormality, atraumatic Neck: no adenopathy, supple, symmetrical, trachea midline and thyroid normal to inspection and palpation Lungs: clear to auscultation bilaterally Cardiovascular: regular rate and rhythm Breasts: normal appearance, no masses or tenderness Abdomen: soft, non-tender; non distended,  no masses,  no organomegaly Extremities: extremities normal, atraumatic, no cyanosis or edema Skin: Skin color, texture, turgor normal. No rashes or lesions Lymph nodes: Cervical, supraclavicular, and axillary nodes normal. No abnormal inguinal nodes palpated Neurologic: Grossly normal   Pelvic: External genitalia:  no lesions              Urethra:  normal appearing urethra with no masses, tenderness or lesions              Bartholins and Skenes: normal                 Vagina: normal appearing vagina with normal color and discharge, no lesions              Cervix: absent               Bimanual Exam:  Uterus:   uterus absent              Adnexa: no mass, fullness, tenderness               Rectovaginal: Confirms  Anus:  normal sphincter tone, no lesions  Gae Dry chaperoned for the exam.  1. Well woman exam Discussed breast self exam Discussed calcium and vit D intake Will do labs with her primary Mammogram due in May Colonoscopy UTD No pap needed  2. Acute vaginitis - WET PREP FOR TRICH, YEAST, CLUE: negative Will send vaginitis panel  3. Vaginal atrophy Helped with vaginal estrogen - Estradiol 10 MCG TABS vaginal tablet; Place 1 tablet (10 mcg total) vaginally 2 (two) times a week.  Dispense: 24 tablet; Refill: 3  4. Counseling for estrogen replacement therapy Wants to continue, aware of risk. - estradiol (VIVELLE-DOT) 0.075 MG/24HR; APPLY 1 PATCH TOPICALLY TO  SKIN TWICE WEEKLY  Dispense: 24 patch; Refill: 3

## 2020-12-18 ENCOUNTER — Encounter: Payer: Self-pay | Admitting: Obstetrics and Gynecology

## 2020-12-25 LAB — SURESWAB BACTERIAL VAGINOSIS/ITIS
Atopobium vaginae: 6.1 Log (cells/mL)
BV CATEGORY: UNDETERMINED — AB
C. albicans, DNA: DETECTED — AB
C. glabrata, DNA: NOT DETECTED
C. parapsilosis, DNA: NOT DETECTED
C. tropicalis, DNA: NOT DETECTED
Gardnerella vaginalis: 7.4 Log (cells/mL)
LACTOBACILLUS SPECIES: 6.5 Log (cells/mL)
MEGASPHAERA SPECIES: NOT DETECTED Log (cells/mL)
Trichomonas vaginalis RNA: NOT DETECTED

## 2020-12-28 ENCOUNTER — Other Ambulatory Visit: Payer: Self-pay

## 2020-12-28 MED ORDER — FLUCONAZOLE 150 MG PO TABS: ORAL_TABLET | ORAL | 0 refills | Status: DC

## 2021-01-14 ENCOUNTER — Telehealth: Payer: Self-pay

## 2021-01-18 ENCOUNTER — Other Ambulatory Visit: Payer: Self-pay | Admitting: Family Medicine

## 2021-01-18 ENCOUNTER — Other Ambulatory Visit: Payer: 59

## 2021-01-18 ENCOUNTER — Other Ambulatory Visit: Payer: Self-pay

## 2021-01-18 ENCOUNTER — Encounter: Payer: 59 | Admitting: Family

## 2021-01-18 DIAGNOSIS — Z0000 Encounter for general adult medical examination without abnormal findings: Secondary | ICD-10-CM

## 2021-01-19 LAB — CBC WITH DIFFERENTIAL/PLATELET
Basophils Absolute: 0 10*3/uL (ref 0.0–0.2)
Basos: 1 %
EOS (ABSOLUTE): 0 10*3/uL (ref 0.0–0.4)
Eos: 1 %
Hematocrit: 40.1 % (ref 34.0–46.6)
Hemoglobin: 13.8 g/dL (ref 11.1–15.9)
Immature Grans (Abs): 0 10*3/uL (ref 0.0–0.1)
Immature Granulocytes: 0 %
Lymphocytes Absolute: 1.1 10*3/uL (ref 0.7–3.1)
Lymphs: 36 %
MCH: 32.2 pg (ref 26.6–33.0)
MCHC: 34.4 g/dL (ref 31.5–35.7)
MCV: 94 fL (ref 79–97)
Monocytes Absolute: 0.2 10*3/uL (ref 0.1–0.9)
Monocytes: 7 %
Neutrophils Absolute: 1.8 10*3/uL (ref 1.4–7.0)
Neutrophils: 55 %
Platelets: 248 10*3/uL (ref 150–450)
RBC: 4.29 x10E6/uL (ref 3.77–5.28)
RDW: 12.4 % (ref 11.7–15.4)
WBC: 3.2 10*3/uL — ABNORMAL LOW (ref 3.4–10.8)

## 2021-01-19 LAB — LIPID PANEL
Chol/HDL Ratio: 2.2 ratio (ref 0.0–4.4)
Cholesterol, Total: 207 mg/dL — ABNORMAL HIGH (ref 100–199)
HDL: 95 mg/dL (ref >39)
LDL Chol Calc (NIH): 101 mg/dL — ABNORMAL HIGH (ref 0–99)
Triglycerides: 63 mg/dL (ref 0–149)
VLDL Cholesterol Cal: 11 mg/dL (ref 5–40)

## 2021-01-19 LAB — CMP14+EGFR
ALT: 15 IU/L (ref 0–32)
AST: 19 IU/L (ref 0–40)
Albumin/Globulin Ratio: 1.9 (ref 1.2–2.2)
Albumin: 4.2 g/dL (ref 3.8–4.9)
Alkaline Phosphatase: 49 IU/L (ref 44–121)
BUN/Creatinine Ratio: 11 (ref 9–23)
BUN: 8 mg/dL (ref 6–24)
Bilirubin Total: 0.3 mg/dL (ref 0.0–1.2)
CO2: 24 mmol/L (ref 20–29)
Calcium: 9.6 mg/dL (ref 8.7–10.2)
Chloride: 101 mmol/L (ref 96–106)
Creatinine, Ser: 0.75 mg/dL (ref 0.57–1.00)
Globulin, Total: 2.2 g/dL (ref 1.5–4.5)
Glucose: 90 mg/dL (ref 65–99)
Potassium: 4.5 mmol/L (ref 3.5–5.2)
Sodium: 139 mmol/L (ref 134–144)
Total Protein: 6.4 g/dL (ref 6.0–8.5)
eGFR: 95 mL/min/{1.73_m2} (ref >59)

## 2021-01-19 NOTE — Telephone Encounter (Signed)
Ordered

## 2021-02-08 ENCOUNTER — Encounter: Payer: Self-pay | Admitting: Family

## 2021-02-08 ENCOUNTER — Ambulatory Visit (INDEPENDENT_AMBULATORY_CARE_PROVIDER_SITE_OTHER): Payer: 59 | Admitting: Family

## 2021-02-08 ENCOUNTER — Other Ambulatory Visit: Payer: Self-pay

## 2021-02-08 VITALS — BP 121/79 | HR 67 | Temp 97.6°F | Ht 63.0 in | Wt 145.6 lb

## 2021-02-08 DIAGNOSIS — M545 Low back pain, unspecified: Secondary | ICD-10-CM

## 2021-02-08 DIAGNOSIS — M329 Systemic lupus erythematosus, unspecified: Secondary | ICD-10-CM

## 2021-02-08 DIAGNOSIS — Z0001 Encounter for general adult medical examination with abnormal findings: Secondary | ICD-10-CM

## 2021-02-08 DIAGNOSIS — I700 Atherosclerosis of aorta: Secondary | ICD-10-CM

## 2021-02-08 DIAGNOSIS — Z9889 Other specified postprocedural states: Secondary | ICD-10-CM

## 2021-02-08 DIAGNOSIS — M797 Fibromyalgia: Secondary | ICD-10-CM

## 2021-02-08 DIAGNOSIS — G8929 Other chronic pain: Secondary | ICD-10-CM

## 2021-02-08 DIAGNOSIS — Z0000 Encounter for general adult medical examination without abnormal findings: Secondary | ICD-10-CM

## 2021-02-08 MED ORDER — BACLOFEN 10 MG PO TABS
10.0000 mg | ORAL_TABLET | Freq: Three times a day (TID) | ORAL | 2 refills | Status: DC
Start: 2021-02-08 — End: 2021-03-02

## 2021-02-08 NOTE — Progress Notes (Signed)
Subjective:    Patient ID: Maureen [name], female    DOB: 11-Mar-1966, 55 y.o.   MRN: [id number] Chief Complaint  Patient presents with  . Annual Exam    No pap.  Wants to check cholesterol  and CT, wants to discuss getting soma 30 day supply as needed?    Pt presents to the office today for CPE without pap. She is followed by GYN annually for paps. She was going to pain management, but stopped over the last year.  She is followed by Rheumatologists for Lupus & Fibromyalgia.   She has chronic back pain and states she still has pain medications she has "stored up" and takes as needed.   She has labs drawn on 01/18/21.  Back Pain This is a chronic problem. The current episode started more than 1 year ago. The problem occurs intermittently. The problem has been waxing and waning since onset. The pain is present in the lumbar spine. The quality of the pain is described as aching. The pain is at a severity of 5/10. The pain is moderate. She has tried bed rest, muscle relaxant and NSAIDs for the symptoms. The treatment provided mild relief.  Arthritis Presents for follow-up visit. She complains of pain, stiffness and joint warmth. Affected locations include the left MCP, right MCP, right knee and left knee. Her pain is at a severity of 5/10.      Review of Systems  Musculoskeletal: Positive for arthritis, back pain and stiffness.  All other systems reviewed and are negative.  Family History  Problem Relation Age of Onset  . Hyperlipidemia Mother   . Depression Mother   . Anxiety disorder Mother   . Ovarian cancer Maternal Aunt   . Colon cancer Maternal Uncle   . Colon cancer Paternal Aunt   . Breast cancer Maternal Grandmother   . Alzheimer's disease Maternal Grandmother    Social History   Socioeconomic History  . Marital status: Legally Separated    Spouse name: Not on file  . Number of children: Not on file  . Years of education: Not on file  . Highest education level: Not  on file  Occupational History  . Not on file  Tobacco Use  . Smoking status: Former Smoker    Packs/day: 0.50    Years: 20.00    Pack years: 10.00    Types: Cigarettes    Quit date: 06/09/2008    Years since quitting: 12.6  . Smokeless tobacco: Never Used  Vaping Use  . Vaping Use: Never used  Substance and Sexual Activity  . Alcohol use: No    Alcohol/week: 0.0 standard drinks  . Drug use: No  . Sexual activity: Not Currently    Partners: Male    Birth control/protection: Surgical  Other Topics Concern  . Not on file  Social History Narrative  . Not on file   Social Determinants of Health   Financial Resource Strain: Not on file  Food Insecurity: Not on file  Transportation Needs: Not on file  Physical Activity: Not on file  Stress: Not on file  Social Connections: Not on file       Objective:   Physical Exam Vitals reviewed.  Constitutional:      General: She is not in acute distress.    Appearance: She is well-developed.  HENT:     Head: Normocephalic and atraumatic.     Right Ear: Tympanic membrane normal.     Left Ear: Tympanic membrane normal.  Eyes:     Pupils: Pupils are equal, round, and reactive to light.  Neck:     Thyroid: No thyromegaly.  Cardiovascular:     Rate and Rhythm: Normal rate and regular rhythm.     Heart sounds: Normal heart sounds. No murmur heard.   Pulmonary:     Effort: Pulmonary effort is normal. No respiratory distress.     Breath sounds: Normal breath sounds. No wheezing.  Abdominal:     General: Bowel sounds are normal. There is no distension.     Palpations: Abdomen is soft.     Tenderness: There is no abdominal tenderness.  Musculoskeletal:        General: No tenderness. Normal range of motion.     Cervical back: Normal range of motion and neck supple.  Skin:    General: Skin is warm and dry.  Neurological:     Mental Status: She is alert and oriented to person, place, and time.     Cranial Nerves: No cranial  nerve deficit.     Deep Tendon Reflexes: Reflexes are normal and symmetric.  Psychiatric:        Behavior: Behavior normal.        Thought Content: Thought content normal.        Judgment: Judgment normal.       BP 121/79   Pulse 67   Temp 97.6 F (36.4 C) (Temporal)   Ht 5\' 3"  (1.6 m)   Wt 145 lb 9.6 oz (66 kg)   BMI 25.79 kg/m      Assessment & Plan:  Maureen [name] comes in today with chief complaint of Annual Exam (No pap.  Wants to check cholesterol  and CT, wants to discuss getting soma 30 day supply as needed?)   Diagnosis and orders addressed:  1. Annual physical exam  2. Systemic lupus erythematosus, unspecified SLE type, unspecified organ involvement status ([place])  3. H/O Spinal surgery - baclofen (LIORESAL) 10 MG tablet; Take 1 tablet (10 mg total) by mouth 3 (three) times daily.  Dispense: 90 each; Refill: 2  4. Fibromyalgi  5. Aortic atherosclerosis ([place]  6. Chronic low back pain, unspecified back pain laterality, unspecified whether sciatica present  - baclofen (LIORESAL) 10 MG tablet; Take 1 tablet (10 mg total) by mouth 3 (three) times daily.  Dispense: 90 each; Refill: 2   Labs reviewed Health Maintenance reviewed Diet and exercise encouraged  Follow up plan: 1 year   Evelina Dun, FNP

## 2021-02-08 NOTE — Patient Instructions (Signed)
Atherosclerosis  Atherosclerosis is narrowing and hardening of the arteries. Arteries are blood vessels that carry blood from the heart to all parts of the body. This blood contains oxygen. Arteries can become narrow or blocked from inflammation or from a buildup of fat, cholesterol, calcium, and other substances (plaque). Plaque decreases the amount of blood that can flow through the artery. Atherosclerosis can affect any artery in your body, including:  Heart arteries. Damage to these arteries may lead to coronary artery disease, which can cause a heart attack.  Brain arteries. Damage to these arteries may cause a stroke.  Leg, arm, and pelvis arteries. Peripheral artery disease (PAD) may result from damage to these arteries.  Kidney arteries. Kidney (renal) failure may result from damage to kidney arteries. Treatment may slow the disease and prevent further damage to your heart, brain, peripheral arteries, and kidneys. What are the causes? This condition develops slowly over many years. The inner layers of your arteries become damaged and allow the gradual buildup of plaque. The exact cause of atherosclerosis is not fully understood. Symptoms of atherosclerosis do not occur until an artery becomes narrow or blocked. What increases the risk? The following factors may make you more likely to develop this condition:  Being middle-aged or older.  Certain medical conditions, including: ? High blood pressure. ? High cholesterol. ? High blood fats (triglycerides). ? Diabetes. ? Sleep apnea.  A family history of atherosclerosis.  Being overweight.  Using products that contain tobacco or nicotine.  Not exercising enough (sedentary lifestyle).  Having a substance in your blood called C-reactive protein (CRP). This is a sign of increased levels of inflammation in your body.  Being stressed.  Drinking too much alcohol or using drugs, such as cocaine or methamphetamine. What are the  signs or symptoms? Symptoms of atherosclerosis may not be obvious until there is damage to an area of your body that is not getting enough blood. Sometimes, atherosclerosis does not cause symptoms. Symptoms of this condition include:  Coronary artery disease. This may cause chest pain and shortness of breath.  Decreased blood supply to your brain, which may cause a stroke. Signs of a stroke may include sudden: ? Weakness or numbness in your face, arm, or leg, especially on one side of your body. ? Trouble walking or difficulty moving your arms or legs. ? Loss of balance or coordination. ? Confusion. ? Slurred speech (dysarthria). ? Trouble speaking, or trouble understanding speech, or both (aphasia). ? Vision changes in one or both eyes. This may be double vision, blurred vision, or loss of vision. ? Severe headache with no known cause. The headache is often described as the worst headache ever experienced.  PAD, which may cause pain and numbness, often in your legs and hips.  Renal failure. This may cause tiredness, problems with urination, swelling, and itchy skin. How is this diagnosed? This condition is diagnosed based on your medical history and a physical exam. During the exam, your health care provider will:  Check your pulse in different places.  Listen for a "whooshing" sound over your arteries (bruit). You may also have tests, such as:  Blood tests to check your levels of cholesterol, triglycerides, and CRP.  Electrocardiogram (ECG) to check for heart damage.  Chest X-ray to see if you have an enlarged heart, which is a sign of heart failure.  Stress test to see how your heart reacts to exercise.  Echocardiogram to get images of the inside of your heart.  Ankle-brachial index  to compare blood pressure in your arms to blood pressure in your ankles.  Ultrasound of your peripheral arteries to check blood flow.  CT scan to check for damage to your heart or  brain.  X-rays of blood vessels after dye has been injected (angiogram) to check blood flow. How is this treated? This condition is treated with lifestyle changes as the first step. These may include:  Changing your diet.  Losing weight.  Reducing stress.  Exercising and being physically active more regularly.  Quitting smoking. You may also need medicine to:  Lower triglycerides and cholesterol.  Control blood pressure.  Prevent blood clots.  Lower inflammation in your body.  Control your blood sugar. Sometimes, surgery is needed to:  Remove plaque from an artery (endarterectomy).  Open or widen a narrowed heart artery (angioplasty).  Create a new path for your blood with one of these procedures: ? Heart (coronary) artery bypass graft surgery. ? Peripheral artery bypass graft surgery. Follow these instructions at home: Eating and drinking  Eat a heart-healthy diet. Talk with your health care provider or a diet and nutrition specialist (dietitian) if you need help. A heart-healthy diet involves: ? Limiting unhealthy fats and increasing healthy fats. Some examples of healthy fats are olive oil and canola oil. ? Eating plant-based foods, such as fruits, vegetables, nuts, whole grains, and legumes (such as peas and lentils).  If you drink alcohol: ? Limit how much you use to:  0-1 drink a day for nonpregnant women.  0-2 drinks a day for men. ? Be aware of how much alcohol is in your drink. In the [place] one drink equals one 12 oz bottle of beer (355 mL), one 5 oz glass of wine (148 mL), or one 1 oz glass of hard liquor (44 mL).   Lifestyle  Maintain a healthy weight. Lose weight if your health care provider says that you need to do that.  Follow an exercise program as told by your health care provider.  Do not use any products that contain nicotine or tobacco, such as cigarettes, e-cigarettes, and chewing tobacco. If you need help quitting, ask your health care  provider.  Do not use drugs.   General instructions  Take over-the-counter and prescription medicines only as told by your health care provider.  Manage other health conditions as told.  Keep all follow-up visits as told by your health care provider. This is important. Contact a health care provider if you have:  An irregular heartbeat.  Unexplained fatigue.  Trouble urinating, or you are producing less urine or foamy urine.  Swelling of your hands or feet, or itchy skin.  Unexplained pain or numbness in your legs or hips. Get help right away if:  You have any symptoms of a heart attack. These may be: ? Chest pain. This includes squeezing chest pain that may feel like indigestion (angina). ? Shortness of breath. ? Pain in your neck, jaw, arms, back, or stomach. ? Cold sweat. ? Nausea. ? Light-headedness.  You have any symptoms of a stroke. "BE FAST" is an easy way to remember the main warning signs of a stroke: ? B - Balance. Signs are dizziness, sudden trouble walking, or loss of balance. ? E - Eyes. Signs are trouble seeing or a sudden change in vision. ? F - Face. Signs are sudden weakness or numbness of the face, or the face or eyelid drooping on one side. ? A - Arms. Signs are weakness or numbness in an arm.  This happens suddenly and usually on one side of the body. ? S - Speech. Signs are sudden trouble speaking, slurred speech, or trouble understanding what people say. ? T - Time. Time to call emergency services. Write down what time symptoms started.  You have other signs of a stroke, such as: ? A sudden, severe headache with no known cause. ? Nausea or vomiting. ? Seizure. These symptoms may represent a serious problem that is an emergency. Do not wait to see if the symptoms will go away. Get medical help right away. Call your local emergency services (911 in the [place] Do not drive yourself to the hospital. Summary  Atherosclerosis is narrowing and hardening of  the arteries.  Arteries can become narrow from inflammation or from a buildup of fat, cholesterol, calcium, and other substances (plaque).  This condition may not cause any symptoms. If you do have symptoms, they are caused by damage to an area of your body that is not getting enough blood.  Treatment starts with lifestyle changes and may include medicines. In some cases, surgery is needed.  Get help right away if you have any symptoms of a heart attack or stroke. This information is not intended to replace advice given to you by your health care provider. Make sure you discuss any questions you have with your health care provider. Document Revised: 08/19/2019 Document Reviewed: 08/19/2019 Elsevier Patient Education  [place].

## 2021-03-02 ENCOUNTER — Ambulatory Visit (INDEPENDENT_AMBULATORY_CARE_PROVIDER_SITE_OTHER): Payer: 59

## 2021-03-02 ENCOUNTER — Other Ambulatory Visit: Payer: Self-pay

## 2021-03-02 ENCOUNTER — Ambulatory Visit: Payer: 59 | Admitting: Family Medicine

## 2021-03-02 ENCOUNTER — Encounter: Payer: Self-pay | Admitting: Family Medicine

## 2021-03-02 VITALS — BP 106/75 | HR 66 | Temp 98.0°F | Ht 63.0 in | Wt 148.4 lb

## 2021-03-02 DIAGNOSIS — S99922A Unspecified injury of left foot, initial encounter: Secondary | ICD-10-CM | POA: Diagnosis not present

## 2021-03-02 DIAGNOSIS — M545 Low back pain, unspecified: Secondary | ICD-10-CM | POA: Diagnosis not present

## 2021-03-02 DIAGNOSIS — G8929 Other chronic pain: Secondary | ICD-10-CM | POA: Diagnosis not present

## 2021-03-02 DIAGNOSIS — Z9889 Other specified postprocedural states: Secondary | ICD-10-CM

## 2021-03-02 MED ORDER — BACLOFEN 10 MG PO TABS: 10.0000 mg | ORAL_TABLET | Freq: Three times a day (TID) | ORAL | 2 refills | Status: DC

## 2021-03-02 NOTE — Progress Notes (Signed)
Chief Complaint  Patient presents with  . Toe Injury    Left small    HPI  Patient presents today for pain in left 5th toe for 1 day Wearing open toe shoe yesterday, kicked something accidentally with the toe.  Felt pain and buddy taped and iced it.  Its been hurting ever since.  It is swollen as well.  She says she has broken her toes several times over several years time.  Yes I guess okay as discussed says she can take Tylenol or small amounts of ibuprofen safely PMH: Smoking status noted ROS: Per HPI  Objective: BP 106/75   Pulse 66   Temp 98 F (36.7 C)   Ht 5\' 3"  (1.6 m)   Wt 148 lb 6.4 oz (67.3 kg)   SpO2 99%   BMI 26.29 kg/m  Gen: NAD, alert, cooperative with exam HEENT: NCAT, EOMI, PERRL CV: RRR, good S1/S2, no murmur Resp: CTABL, no wheezes, non-labored Abd: SNTND, BS present, no guarding or organomegaly Ext: No edema, warm Neuro: Alert and oriented, No gross deficits  Assessment and plan:  1. Injury of small toe, left, initial encounter   2. H/O Spinal surgery   3. Chronic low back pain, unspecified back pain laterality, unspecified whether sciatica present     Meds ordered this encounter  Medications  . baclofen (LIORESAL) 10 MG tablet    Sig: Take 1 tablet (10 mg total) by mouth 3 (three) times daily.    Dispense:  90 each    Refill:  2    Orders Placed This Encounter  Procedures  . DG Foot Complete Left    Standing Status:   Future    Number of Occurrences:   1    Standing Expiration Date:   03/02/2022    Order Specific Question:   Reason for Exam (SYMPTOM  OR DIAGNOSIS REQUIRED)    Answer:   toe injury    Order Specific Question:   Is patient pregnant?    Answer:   No    Order Specific Question:   Preferred imaging location?    Answer:   Internal  Wear postop shoe for 2 weeks whenever you are on your feet.  After that you can start to wean off based on pain.  If there is not continued improvement in the pain as you wean off of the shoe, follow-up  in 4 weeks.   Claretta Fraise, MD

## 2021-03-30 ENCOUNTER — Other Ambulatory Visit: Payer: Self-pay | Admitting: Obstetrics and Gynecology

## 2021-03-30 DIAGNOSIS — Z1231 Encounter for screening mammogram for malignant neoplasm of breast: Secondary | ICD-10-CM

## 2021-05-14 ENCOUNTER — Other Ambulatory Visit: Payer: Self-pay

## 2021-05-14 ENCOUNTER — Ambulatory Visit
Admission: RE | Admit: 2021-05-14 | Discharge: 2021-05-14 | Disposition: A | Discharge status: Home / Self Care | Payer: 59 | Source: Ambulatory Visit | Attending: Obstetrics and Gynecology | Admitting: Obstetrics and Gynecology

## 2021-05-14 DIAGNOSIS — Z1231 Encounter for screening mammogram for malignant neoplasm of breast: Secondary | ICD-10-CM

## 2021-07-07 ENCOUNTER — Other Ambulatory Visit: Payer: Self-pay | Admitting: Family Medicine

## 2021-07-07 DIAGNOSIS — Z9889 Other specified postprocedural states: Secondary | ICD-10-CM

## 2021-07-07 DIAGNOSIS — G8929 Other chronic pain: Secondary | ICD-10-CM

## 2021-07-07 DIAGNOSIS — M545 Low back pain, unspecified: Secondary | ICD-10-CM

## 2021-09-01 ENCOUNTER — Telehealth: Payer: Self-pay | Admitting: *Deleted

## 2021-09-01 ENCOUNTER — Ambulatory Visit: Payer: 59 | Admitting: Obstetrics and Gynecology

## 2021-09-01 ENCOUNTER — Encounter: Payer: Self-pay | Admitting: Obstetrics and Gynecology

## 2021-09-01 ENCOUNTER — Other Ambulatory Visit: Payer: Self-pay

## 2021-09-01 VITALS — BP 112/78 | HR 65 | Ht 63.0 in | Wt 146.6 lb

## 2021-09-01 DIAGNOSIS — R350 Frequency of micturition: Secondary | ICD-10-CM

## 2021-09-01 DIAGNOSIS — R140 Abdominal distension (gaseous): Secondary | ICD-10-CM

## 2021-09-01 DIAGNOSIS — N760 Acute vaginitis: Secondary | ICD-10-CM | POA: Diagnosis not present

## 2021-09-01 DIAGNOSIS — R102 Pelvic and perineal pain: Secondary | ICD-10-CM

## 2021-09-01 DIAGNOSIS — B9689 Other specified bacterial agents as the cause of diseases classified elsewhere: Secondary | ICD-10-CM

## 2021-09-01 DIAGNOSIS — Z7189 Other specified counseling: Secondary | ICD-10-CM

## 2021-09-01 LAB — WET PREP FOR TRICH, YEAST, CLUE

## 2021-09-01 MED ORDER — METRONIDAZOLE 500 MG PO TABS: 500.0000 mg | ORAL_TABLET | Freq: Two times a day (BID) | ORAL | 0 refills | Status: DC

## 2021-09-01 NOTE — Telephone Encounter (Signed)
-----   Message from Salvadore Dom, MD sent at 09/01/2021  3:26 PM EST ----- The patient is on vivelle dot, for some reason they aren't giving her the generic. Can you please reach out to [facility], she says it is too expensive. They told her they had a generic.  I'm confused. Thanks, Sharee Pimple

## 2021-09-01 NOTE — Progress Notes (Signed)
GYNECOLOGY  VISIT   HPI: 55 y.o.   Legally Separated White or Caucasian Not Hispanic or Latino  female   [phone] with No LMP recorded. Patient has had a hysterectomy.   here for vaginal pain while inserting vaginal estrogen. She has been using the vaginal estrogen 1 x a week, will go back up to 2 x a week. Also reports episodes leaking of stringy/chear/thick fluid w/ a force happened w/ insertion of estrogen and several times during IC. Doesn't feel she is leaking urine.  No smell or itching, put she is irritated.   In 2021 she had recurrent episodes of yeast and BV. Last treated for BV and yeast in 2/22.   She has IC. She c/o worsening urinary frequency and urgency, no dysuria.   She reports pelvic and back pain, started today. She has been having issues with bloating for months. She notices the bloating after eating. She has intermittent constipation.   She would like to discuss patch/cost. For some reason she isn't getting the generic patch.    GYNECOLOGIC HISTORY: No LMP recorded. Patient has had a hysterectomy. Contraception:hyst Menopausal hormone therapy: vivelle dot/ estradiol.         OB History     Gravida  2   Para  2   Term  1   Preterm  1   AB      Living  3      SAB      IAB      Ectopic      Multiple  1   Live Births  3              Patient Active Problem List   Diagnosis Date Noted   Aortic atherosclerosis ([place]) 02/08/2021   Vision changes 05/28/2020   Paresthesia 05/28/2020   Cervical spondylosis with radiculopathy 07/01/2016   Spondylolisthesis 07/01/2016   Greater trochanteric bursitis of right hip 08/20/2015   Chronic interstitial cystitis 11/25/2014   H/O Spinal surgery 06/24/2014   Fibrositis 01/29/2014   Fibromyalgia 01/29/2014   Lumbosacral radiculitis 10/09/2013   Lumbar post-laminectomy syndrome 09/17/2013   Atypical chest pain 09/02/2013   Polypharmacy 05/01/2013   Other long term (current) drug therapy 05/01/2013    Arthritis 04/16/2013   Abdominal pain, epigastric 03/26/2013   Back pain, chronic 12/05/2012   Connective tissue disease, undifferentiated ([place]) 12/05/2012   Raynaud's syndrome without gangrene 12/05/2012   Systemic lupus erythematosus ([place]) 12/05/2012   Cerebrospinal fluid leak from spinal puncture 08/24/2012   UNSPECIFIED DISORDER OF STOMACH AND DUODENUM 08/26/2008    Past Medical History:  Diagnosis Date   Abdominal pain    Anxiety    takes Xanax daily as needed   Arthritis    Cancer ([place])    MOLE PRE   Cerebrospinal fluid leak from spinal puncture 08/24/2012   Chronic back pain    Constipation    takes Dulcolax and Miralax daily as needed;also Fiber Pills   Depression    takes Lamictal daily   Fibromyalgia    GERD (gastroesophageal reflux disease)    takes Omeprazole daily   Headache(784.0)    Heart murmur    slight   History of bronchitis    last time many yrs ago(18yrs ago)   Internal hemorrhoid    Interstitial cystitis    no problems since beginning of Jan 2014   Joint pain    Joint swelling    Lupus ([place])    takes Plaquenil daily   Muscle spasm  takes Robaxin daily as needed   Numbness and tingling in hands    Osteoarthritis    Pneumonia    hx of > 60yrs ago   Raynaud's disease    Urinary frequency    Urinary urgency     Past Surgical History:  Procedure Laterality Date   ABDOMINAL HERNIA REPAIR     APPENDECTOMY     CESAREAN SECTION     CHOLECYSTECTOMY     COLONOSCOPY     DIAGNOSTIC LAPAROSCOPY     adhesions   ENDOMETRIAL ABLATION     ESOPHAGOGASTRODUODENOSCOPY N/A 05/08/2013   Procedure: ESOPHAGOGASTRODUODENOSCOPY (EGD);  Surgeon: Rogene Houston, MD;  Location: [facility];  Service: Endoscopy;  Laterality: N/A;  315   NASAL SEPTUM SURGERY     OVARIAN CYST REMOVAL     SHOULDER SURGERY Right    x 2   SPINAL CORD STIMULATOR INSERTION N/A 06/13/2014   Procedure: LUMBAR SPINAL CORD STIMULATOR INSERTION;  Surgeon: Bonna Gains, MD;  Location:  [facility];  Service: Neurosurgery;  Laterality: N/A;   SPINAL FUSION     x 2    TONSILLECTOMY     TUBAL LIGATION     uterine ablation     VAGINAL HYSTERECTOMY  8/12    Current Outpatient Medications  Medication Sig Dispense Refill   baclofen (LIORESAL) 10 MG tablet TAKE 1 TABLET BY MOUTH THREE TIMES A DAY 90 tablet 2   Cholecalciferol (VITAMIN D3) 5000 units CAPS Take 1 tablet by mouth daily.     estradiol (VIVELLE-DOT) 0.075 MG/24HR APPLY 1 PATCH TOPICALLY TO  SKIN TWICE WEEKLY 24 patch 3   Estradiol 10 MCG TABS vaginal tablet Place 1 tablet (10 mcg total) vaginally 2 (two) times a week. 24 tablet 3   hydrochlorothiazide (HYDRODIURIL) 12.5 MG tablet Take 1 tablet (12.5 mg total) by mouth daily. 90 tablet 3   magnesium oxide (MAG-OX) 400 MG tablet Take 400 mg by mouth daily.     Melatonin-Pyridoxine (MELATIN PO) Take by mouth.     Omega-3 Fatty Acids (FISH OIL) 1000 MG CAPS Take by mouth.     TURMERIC PO Take by mouth daily.     No current facility-administered medications for this visit.     ALLERGIES: Amoxicillin, Penicillins, and Tegretol [carbamazepine]  Family History  Problem Relation Age of Onset   Hyperlipidemia Mother    Depression Mother    Anxiety disorder Mother    Ovarian cancer Maternal Aunt    Colon cancer Maternal Uncle    Colon cancer Paternal Aunt    Breast cancer Maternal Grandmother    Alzheimer's disease Maternal Grandmother     Social History   Socioeconomic History   Marital status: Legally Separated    Spouse name: Not on file   Number of children: Not on file   Years of education: Not on file   Highest education level: Not on file  Occupational History   Not on file  Tobacco Use   Smoking status: Former    Packs/day: 0.50    Years: 20.00    Pack years: 10.00    Types: Cigarettes    Quit date: 06/09/2008    Years since quitting: 13.2   Smokeless tobacco: Never  Vaping Use   Vaping Use: Never used  Substance and Sexual Activity    Alcohol use: No    Alcohol/week: 0.0 standard drinks   Drug use: No   Sexual activity: Not Currently    Partners: Male  Birth control/protection: Surgical  Other Topics Concern   Not on file  Social History Narrative   Not on file   Social Determinants of Health   Financial Resource Strain: Not on file  Food Insecurity: Not on file  Transportation Needs: Not on file  Physical Activity: Not on file  Stress: Not on file  Social Connections: Not on file  Intimate Partner Violence: Not on file    ROS  PHYSICAL EXAMINATION:    There were no vitals taken for this visit.    General appearance: alert, cooperative and appears stated age Abdomen: soft, mildly tender in the region of the ascending and descending colon. Mildly distended, no masses,  no organomegaly  Pelvic: External genitalia:  no lesions              Urethra:  normal appearing urethra with no masses, tenderness or lesions              Bartholins and Skenes: normal                 Vagina: normal appearing vagina with an increase in watery/white vaginal d/c              Cervix: absent              Bimanual Exam:  Uterus:  uterus absent              Adnexa: no mass, fullness, tenderness                Chaperone was present for exam.  1. Acute vaginitis - WET PREP FOR TRICH, YEAST, CLUE  2. Pelvic pain Not tender on pelvic exam. Suspect her pain is GI  3. Bloating Discussed trying to eliminate dairy and then gluten to see if she feels better. F/U with primary or GI  4. Urinary frequency - Urinalysis,Complete w/RFL Culture  5. BV (bacterial vaginosis) - metroNIDAZOLE (FLAGYL) 500 MG tablet; Take 1 tablet (500 mg total) by mouth 2 (two) times daily.  Dispense: 14 tablet; Refill: 0

## 2021-09-01 NOTE — Telephone Encounter (Signed)
Per [facility] generic was sent in,but is the same cost as Brand due to insurance coverage. Reference # [id number] Patient would like an alternative option to  generic Vivelle DOT due to cost. I will route to Provider for recommendations

## 2021-09-03 LAB — URINALYSIS, COMPLETE W/RFL CULTURE
Bilirubin Urine: NEGATIVE
Glucose, UA: NEGATIVE
Hyaline Cast: NONE SEEN /LPF
Ketones, ur: NEGATIVE
Leukocyte Esterase: NEGATIVE
Nitrites, Initial: NEGATIVE
Protein, ur: NEGATIVE
Specific Gravity, Urine: 1.01 (ref 1.001–1.035)
pH: 6.5 (ref 5.0–8.0)

## 2021-09-03 LAB — URINE CULTURE
MICRO NUMBER:: 12614960
SPECIMEN QUALITY:: ADEQUATE

## 2021-09-03 LAB — CULTURE INDICATED

## 2021-09-03 MED ORDER — VIVELLE-DOT 0.075 MG/24HR TD PTTW: MEDICATED_PATCH | TRANSDERMAL | 1 refills | Status: DC

## 2021-09-03 NOTE — Telephone Encounter (Signed)
Spoke with pharmacy. Generic Climara 0.075 mg will be $20 for 3 month supply. Brand is not covered. I will route to Provider to get approval to send into pharmacy.

## 2021-09-03 NOTE — Telephone Encounter (Signed)
Is the climara generic more affordable? What options do they give [place]? Transdermal is a better option than oral.

## 2021-09-03 NOTE — Telephone Encounter (Signed)
Relayed message to patient she decided to decline climara patch. She will use generic Vivelle dot from [facility] instead of mail order pharmacy cost effective through local pharmacy . Patient states she will pay out of pocket and use a discount card. Does not want to switch because she likes the current medication she's on now. I will [facility] and rely message to Provider as well as close encounter.

## 2021-09-03 NOTE — Telephone Encounter (Signed)
Yes, please send in the generic climara patch, 0.075 mg, change q week. #12, no refills. Make sure she knows this patch lasts for a week. Thanks.

## 2021-09-24 ENCOUNTER — Encounter: Payer: Self-pay | Admitting: Obstetrics and Gynecology

## 2021-09-24 ENCOUNTER — Other Ambulatory Visit: Payer: Self-pay

## 2021-09-24 ENCOUNTER — Ambulatory Visit: Payer: 59 | Admitting: Obstetrics and Gynecology

## 2021-09-24 VITALS — BP 116/76

## 2021-09-24 DIAGNOSIS — R300 Dysuria: Secondary | ICD-10-CM | POA: Diagnosis not present

## 2021-09-24 DIAGNOSIS — N898 Other specified noninflammatory disorders of vagina: Secondary | ICD-10-CM

## 2021-09-24 LAB — URINALYSIS, COMPLETE W/RFL CULTURE
Bacteria, UA: NONE SEEN /HPF
Bilirubin Urine: NEGATIVE
Glucose, UA: NEGATIVE
Hgb urine dipstick: NEGATIVE
Hyaline Cast: NONE SEEN /LPF
Ketones, ur: NEGATIVE
Leukocyte Esterase: NEGATIVE
Nitrites, Initial: NEGATIVE
Protein, ur: NEGATIVE
RBC / HPF: NONE SEEN /HPF (ref 0–2)
Specific Gravity, Urine: 1.01 (ref 1.001–1.035)
WBC, UA: NONE SEEN /HPF (ref 0–5)
pH: 7 (ref 5.0–8.0)

## 2021-09-24 LAB — WET PREP FOR TRICH, YEAST, CLUE

## 2021-09-24 LAB — NO CULTURE INDICATED

## 2021-09-24 NOTE — Progress Notes (Signed)
GYNECOLOGY  VISIT   HPI: 55 y.o.   Legally Separated White or Caucasian Not Hispanic or Latino  female   [phone] with No LMP recorded. Patient has had a hysterectomy.   here for  dysuria and vaginal irritation. She has a h/o IC and multiple episodes of vaginitis.  She was treated with flagyl for BV last month, symptoms never completely went away. She has vulvar irritation and itching, slight odor after sex, no discharge. She has continued issues with her bladder and IC. Recently it has been worse, yesterday she had burning with urination, urinary odor, frequency, urgency, some leakage after she voids. The urethra feels irritated.   She is on the Vivelle dot and vaginal estrogen.   Past history: 4/21 negative vaginitis panel 5/21 treated for yeast and BV at urgent care 6/21 indeterminant for BV so treated 7/21 yeast and BV 2/22 BV and yeast 11/22 BV  GYNECOLOGIC HISTORY: No LMP recorded. Patient has had a hysterectomy. Contraception:Hyst Menopausal hormone therapy: Vivelle Dot        OB History     Gravida  2   Para  2   Term  1   Preterm  1   AB      Living  3      SAB      IAB      Ectopic      Multiple  1   Live Births  3              Patient Active Problem List   Diagnosis Date Noted   Aortic atherosclerosis ([place]) 02/08/2021   Vision changes 05/28/2020   Paresthesia 05/28/2020   Cervical spondylosis with radiculopathy 07/01/2016   Spondylolisthesis 07/01/2016   Greater trochanteric bursitis of right hip 08/20/2015   Chronic interstitial cystitis 11/25/2014   H/O Spinal surgery 06/24/2014   Fibrositis 01/29/2014   Fibromyalgia 01/29/2014   Lumbosacral radiculitis 10/09/2013   Lumbar post-laminectomy syndrome 09/17/2013   Atypical chest pain 09/02/2013   Polypharmacy 05/01/2013   Other long term (current) drug therapy 05/01/2013   Arthritis 04/16/2013   Abdominal pain, epigastric 03/26/2013   Back pain, chronic 12/05/2012   Connective tissue  disease, undifferentiated ([place]) 12/05/2012   Raynaud's syndrome without gangrene 12/05/2012   Systemic lupus erythematosus ([place]) 12/05/2012   Cerebrospinal fluid leak from spinal puncture 08/24/2012   UNSPECIFIED DISORDER OF STOMACH AND DUODENUM 08/26/2008    Past Medical History:  Diagnosis Date   Abdominal pain    Anxiety    takes Xanax daily as needed   Arthritis    Cancer ([place])    MOLE PRE   Cerebrospinal fluid leak from spinal puncture 08/24/2012   Chronic back pain    Constipation    takes Dulcolax and Miralax daily as needed;also Fiber Pills   Depression    takes Lamictal daily   Fibromyalgia    GERD (gastroesophageal reflux disease)    takes Omeprazole daily   Headache(784.0)    Heart murmur    slight   History of bronchitis    last time many yrs ago(30yrs ago)   Internal hemorrhoid    Interstitial cystitis    no problems since beginning of Jan 2014   Joint pain    Joint swelling    Lupus ([place])    takes Plaquenil daily   Muscle spasm    takes Robaxin daily as needed   Numbness and tingling in hands    Osteoarthritis    Pneumonia    hx  of > 57yrs ago   Raynaud's disease    Urinary frequency    Urinary urgency     Past Surgical History:  Procedure Laterality Date   ABDOMINAL HERNIA REPAIR     APPENDECTOMY     CESAREAN SECTION     CHOLECYSTECTOMY     COLONOSCOPY     DIAGNOSTIC LAPAROSCOPY     adhesions   ENDOMETRIAL ABLATION     ESOPHAGOGASTRODUODENOSCOPY N/A 05/08/2013   Procedure: ESOPHAGOGASTRODUODENOSCOPY (EGD);  Surgeon: Rogene Houston, MD;  [facility];  Service: Endoscopy;  Laterality: N/A;  315   NASAL SEPTUM SURGERY     OVARIAN CYST REMOVAL     SHOULDER SURGERY Right    x 2   SPINAL CORD STIMULATOR INSERTION N/A 06/13/2014   Procedure: LUMBAR SPINAL CORD STIMULATOR INSERTION;  Surgeon: Bonna Gains, MD;  Location: [facility];  Service: Neurosurgery;  Laterality: N/A;   SPINAL FUSION     x 2    TONSILLECTOMY     TUBAL  LIGATION     uterine ablation     VAGINAL HYSTERECTOMY  8/12    Current Outpatient Medications  Medication Sig Dispense Refill   Cholecalciferol (VITAMIN D3) 5000 units CAPS Take 1 tablet by mouth daily.     Estradiol 10 MCG TABS vaginal tablet Place 1 tablet (10 mcg total) vaginally 2 (two) times a week. 24 tablet 3   hydrochlorothiazide (HYDRODIURIL) 12.5 MG tablet Take 1 tablet (12.5 mg total) by mouth daily. 90 tablet 3   magnesium oxide (MAG-OX) 400 MG tablet Take 400 mg by mouth daily.     Melatonin-Pyridoxine (MELATIN PO) Take by mouth.     TURMERIC PO Take by mouth daily.     VIVELLE-DOT 0.075 MG/24HR APPLY 1 PATCH TOPICALLY TO  SKIN TWICE WEEKLY 24 patch 1   metroNIDAZOLE (FLAGYL) 500 MG tablet Take 1 tablet (500 mg total) by mouth 2 (two) times daily. (Patient not taking: Reported on 09/24/2021) 14 tablet 0   No current facility-administered medications for this visit.     ALLERGIES: Amoxicillin, Penicillins, and Tegretol [carbamazepine]  Family History  Problem Relation Age of Onset   Hyperlipidemia Mother    Depression Mother    Anxiety disorder Mother    Ovarian cancer Maternal Aunt    Colon cancer Maternal Uncle    Colon cancer Paternal Aunt    Breast cancer Maternal Grandmother    Alzheimer's disease Maternal Grandmother     Social History   Socioeconomic History   Marital status: Legally Separated    Spouse name: Not on file   Number of children: Not on file   Years of education: Not on file   Highest education level: Not on file  Occupational History   Not on file  Tobacco Use   Smoking status: Former    Packs/day: 0.50    Years: 20.00    Pack years: 10.00    Types: Cigarettes    Quit date: 06/09/2008    Years since quitting: 13.3   Smokeless tobacco: Never  Vaping Use   Vaping Use: Never used  Substance and Sexual Activity   Alcohol use: No    Alcohol/week: 0.0 standard drinks   Drug use: No   Sexual activity: Not Currently    Partners: Male     Birth control/protection: Surgical  Other Topics Concern   Not on file  Social History Narrative   Not on file   Social Determinants of Health   Financial  Resource Strain: Not on file  Food Insecurity: Not on file  Transportation Needs: Not on file  Physical Activity: Not on file  Stress: Not on file  Social Connections: Not on file  Intimate Partner Violence: Not on file    ROS  PHYSICAL EXAMINATION:    BP 116/76     General appearance: alert, cooperative and appears stated age   Pelvic: External genitalia:  no lesions, no erythema              Urethra:  normal appearing urethra with no masses, tenderness or lesions              Bartholins and Skenes: normal                 Vagina: normal appearing vagina with normal color and discharge, no lesions              Cervix: absent              Bimanual Exam:  Uterus:  uterus absent              Adnexa: no mass, fullness, tenderness              Bladder: tender  Chaperone was present for exam.  1. Vaginal irritation Treated for BV last month. Exam is normal.  - WET PREP FOR TRICH, YEAST, CLUE: negative -Discussed vulvar skin care -Declines topical steroid  2. Dysuria H/O IC - Urinalysis,Complete w/RFL Culture: negative -F/U with urology

## 2021-09-24 NOTE — Patient Instructions (Signed)
Interstitial Cystitis °Interstitial cystitis is inflammation of the bladder. This condition is also known as painful bladder syndrome. This may cause pain in the bladder area as well as a frequent and urgent need to urinate. The bladder is an organ that stores urine after the urine is made in the kidneys. °The severity of interstitial cystitis can vary from person to person. You may have flare-ups, and then your symptoms may go away for a while. For many people, it becomes a long-term (chronic) problem. °What are the causes? °The cause of this condition is not known. °What increases the risk? °The following factors may make you more likely to develop this condition: °Being female. °Having fibromyalgia. °Having irritable bowel syndrome (IBS). °Having endometriosis. °Having chronic fatigue syndrome. °This condition may be aggravated by: °Stress. °Smoking. °Spicy foods. °What are the signs or symptoms? °Symptoms of interstitial cystitis vary, and they can change over time. Symptoms may include: °Discomfort or pain in the bladder area, which is in the lower abdomen. Pain can range from mild to severe. The pain may change in intensity as the bladder fills with urine or as it empties. °Pain in the pelvic area, between the hip bones. °A constant urge to urinate. °Frequent urination. °Pain during urination. °Pain during sex. °Blood in the urine. °Feeling tired (fatigue). °For women, symptoms often get worse during menstruation. °How is this diagnosed? °This condition is diagnosed based on your symptoms, your medical history, and a physical exam. Your health care provider may need to rule out other conditions and may order other tests, such as: °Urine tests. °Cystoscopy. For this test, a tool similar to a very thin telescope is used to look into your bladder. °Biopsy. This involves taking a sample of tissue from the bladder to be examined under a microscope. °How is this treated? °There is no cure for this condition, but  treatment can help you control your symptoms. Work closely with your health care provider to find the most effective treatments for you. Treatment options may include: °Medicines to relieve pain and reduce how often you feel the need to urinate. This treatment may include: °A procedure where a small amount of medicine that eases irritation is put inside your bladder through a catheter (bladder instillation). °Lifestyle changes, such as changing your diet or taking steps to control stress. °Physical therapy. This may include: °Exercises to help relax the pelvic floor muscles. °Massage to relax tight muscles (myofascial release). °Learning ways to control when you urinate (bladder training). °Using a device that provides electrical stimulation to your nerves, which can relieve pain (neuromodulation therapy). The device is placed on your back, where it blocks the nerves that cause you to feel pain in your bladder area. °A procedure that stretches your bladder by filling it with air or fluid (hydrodistention). °Surgery. This is rare. It is only done for extreme cases, if other treatments do not help. °Follow these instructions at home: °Lifestyle °Learn and practice relaxation techniques, such as deep breathing and muscle relaxation. °Get care for your body and mental well-being, such as: °Cognitive behavioral therapy (CBT). This therapy changes the way you think or act in response to different situations. This may improve how you feel. °Seeing a mental health therapist to evaluate and treat depression, if necessary. °Work with your health care provider on other ways to manage pain. Acupuncture may be helpful. °Avoid drinking alcohol. °Do not use any products that contain nicotine or tobacco. These products include cigarettes, chewing tobacco, and vaping devices, such as   e-cigarettes. If you need help quitting, ask your health care provider. °Eating and drinking °Make dietary changes as recommended by your health care  provider. You may need to avoid: °Spicy foods. °Foods that contain a lot of potassium. °Limit your intake of drinks that increase your urge to urinate. These include alcohol and caffeinated drinks like soda, coffee, and tea. °Bladder training ° °Use bladder training techniques as directed. Techniques may include: °Urinating at scheduled times. °Training yourself to delay urination. °Keep a bladder diary. °Write down the times you urinate and any symptoms that you have. This can help you find out which foods, liquids, or activities make your symptoms worse. °Use your bladder diary to schedule bathroom trips. If you are away from home, plan to be near a bathroom at each of your scheduled times. °Make sure that you urinate just before you leave the house and just before you go to bed. °General instructions °Take over-the-counter and prescription medicines only as told by your health care provider. °Try a warm or cool compress over your bladder for comfort. °Avoid wearing tight clothing. °Do exercises to relax your pelvic floor muscles as told by your physical therapist. °Keep all follow-up visits. This is important. °Where to find more information °To find more information or a support group near you, visit: °[facility]: urologyhealth.org °Interstitial Cystitis Association: ichelp.org °Contact a health care provider if you have: °Symptoms that do not get better with treatment. °Pain or discomfort that gets worse. °More frequent urges to urinate. °A fever. °Get help right away if: °You have no control over when you urinate. °Summary °Interstitial cystitis is inflammation of the bladder. °This condition may cause pain in the bladder area as well as a frequent and urgent need to urinate. °You may have flare-ups of the condition, and then it may go away for a while. For many people, it becomes a long-term (chronic) problem. °There is no cure for interstitial cystitis, but treatment methods are available to  control your symptoms. °This information is not intended to replace advice given to you by your health care provider. Make sure you discuss any questions you have with your health care provider. °Document Revised: 05/15/2020 Document Reviewed: 05/15/2020 °Elsevier Patient Education © 2022 [employer] Inc. ° °

## 2021-09-27 ENCOUNTER — Ambulatory Visit: Payer: 59

## 2021-12-14 NOTE — Progress Notes (Signed)
56 y.o. [id number] Legally Separated White or Caucasian Not Hispanic or Latino female here for annual exam. H/O hysterectomy, on transdermal estrogen. Feels good on her current dose, doesn't want to decrease it. Not currently using the vaginal estrogen. ?She is sexually active, together for almost 2 years. No dyspareunia. Lives alone.  ? ?She is having a slight vaginal discharge. Started a couple of days ago. No odor, no irritation, no itching. ? ?She has intermittent constipation. She is having anal pressure, doesn't feel normal (sometimes). Feels irritated, not itchy, feels swollen sometimes. No pain to have a BM.  ? ? She see's Rheumatology for a lupus like syndrome (not officially diagnosed). She reports normal blood work, she has never been told she was at an increased of blood clots. She is seeing her Rheumatologist tomorrow and will confirm that he is okay with her being on estrogen.  ? ?H/O IC, stable. ? ?She is in chronic pain, she has rods and 12 screws in her back.  ? ?No LMP recorded. Patient has had a hysterectomy.          ?Sexually active: Yes.    ?The current method of family planning is status post hysterectomy.    ?Exercising: Yes.     Walking light weights  ?Smoker:  no ? ?Health Maintenance: ?Pap:   2012 WNL per patient ?History of abnormal Pap:  no ?MMG:  04/28/21 density D Bi-rads 1 neg  ?BMD:   none  ?Colonoscopy: 08/19/14 normal f/u 10 years  ?TDaP:  unsure ?Gardasil: n/a ? ? reports that she quit smoking about 13 years ago. Her smoking use included cigarettes. She has a 10.00 pack-year smoking history. She has never used smokeless tobacco. She reports that she does not drink alcohol and does not use drugs. Occasional ETOH. 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 3 grown kids. 3 grandchildren.  ? ?Past Medical History:  ?Diagnosis Date  ? Abdominal pain   ? Anxiety   ? takes Xanax daily as needed  ? Arthritis   ? Cancer [facility])   ? MOLE PRE  ? Cerebrospinal fluid leak from spinal puncture 08/24/2012   ? Chronic back pain   ? Constipation   ? takes Dulcolax and Miralax daily as needed;also Fiber Pills  ? Depression   ? takes Lamictal daily  ? Fibromyalgia   ? GERD (gastroesophageal reflux disease)   ? takes Omeprazole daily  ? Headache(784.0)   ? Heart murmur   ? slight  ? History of bronchitis   ? last time many yrs ago(40yrs ago)  ? Internal hemorrhoid   ? Interstitial cystitis   ? no problems since beginning of Jan 2014  ? Joint pain   ? Joint swelling   ? Lupus ([place])   ? takes Plaquenil daily  ? Muscle spasm   ? takes Robaxin daily as needed  ? Numbness and tingling in hands   ? Osteoarthritis   ? Pneumonia   ? hx of > 60yrs ago  ? Raynaud's disease   ? Urinary frequency   ? Urinary urgency   ? ? ?Past Surgical History:  ?Procedure Laterality Date  ? ABDOMINAL HERNIA REPAIR    ? APPENDECTOMY    ? CESAREAN SECTION    ? CHOLECYSTECTOMY    ? COLONOSCOPY    ? DIAGNOSTIC LAPAROSCOPY    ? adhesions  ? ENDOMETRIAL ABLATION    ? ESOPHAGOGASTRODUODENOSCOPY N/A 05/08/2013  ? Procedure: ESOPHAGOGASTRODUODENOSCOPY (EGD);  Surgeon: Rogene Houston, MD;  [facility];  Service: Endoscopy;  Laterality: N/A;  315  ? NASAL SEPTUM SURGERY    ? OVARIAN CYST REMOVAL    ? SHOULDER SURGERY Right   ? x 2  ? SPINAL CORD STIMULATOR INSERTION N/A 06/13/2014  ? Procedure: LUMBAR SPINAL CORD STIMULATOR INSERTION;  Surgeon: Bonna Gains, MD;  Location: [facility];  Service: Neurosurgery;  Laterality: N/A;  ? SPINAL FUSION    ? x 2   ? TONSILLECTOMY    ? TUBAL LIGATION    ? uterine ablation    ? VAGINAL HYSTERECTOMY  8/12  ? ? ?Current Outpatient Medications  ?Medication Sig Dispense Refill  ? Cholecalciferol (VITAMIN D3) 5000 units CAPS Take 1 tablet by mouth daily.    ? hydrochlorothiazide (HYDRODIURIL) 12.5 MG tablet Take 1 tablet (12.5 mg total) by mouth daily. 90 tablet 3  ? magnesium oxide (MAG-OX) 400 MG tablet Take 400 mg by mouth daily.    ? Melatonin-Pyridoxine (MELATIN PO) Take by mouth.    ? TURMERIC PO Take by  mouth daily.    ? VIVELLE-DOT 0.075 MG/24HR APPLY 1 PATCH TOPICALLY TO  SKIN TWICE WEEKLY 24 patch 1  ? Estradiol 10 MCG TABS vaginal tablet Place 1 tablet (10 mcg total) vaginally 2 (two) times a week. (Patient not taking: Reported on 12/22/2021) 24 tablet 3  ? ?No current facility-administered medications for this visit.  ? ? ?Family History  ?Problem Relation Age of Onset  ? Hyperlipidemia Mother   ? Depression Mother   ? Anxiety disorder Mother   ? Ovarian cancer Maternal Aunt   ? Colon cancer Maternal Uncle   ? Colon cancer Paternal Aunt   ? Breast cancer Maternal Grandmother   ? Alzheimer's disease Maternal Grandmother   ? ? ?Review of Systems  ?Genitourinary:  Positive for vaginal discharge.  ?All other systems reviewed and are negative. ? ?Exam:   ?BP 110/70   Pulse 74   Ht 5\' 3"  (1.6 m)   Wt 146 lb (66.2 kg)   SpO2 100%   BMI 25.86 kg/m?   Weight change: @WEIGHTCHANGE @ Height:   Height: 5\' 3"  (160 cm)  ?Ht Readings from Last 3 Encounters:  ?12/22/21 5\' 3"  (1.6 m)  ?09/01/21 5\' 3"  (1.6 m)  ?03/02/21 5\' 3"  (1.6 m)  ? ? ?General appearance: alert, cooperative and appears stated age ?Head: Normocephalic, without obvious abnormality, atraumatic ?Neck: no adenopathy, supple, symmetrical, trachea midline and thyroid normal to inspection and palpation ?Lungs: clear to auscultation bilaterally ?Cardiovascular: regular rate and rhythm ?Breasts: normal appearance, no masses or tenderness ?Abdomen: soft, non-tender; non distended,  no masses,  no organomegaly ?Extremities: extremities normal, atraumatic, no cyanosis or edema ?Skin: Skin color, texture, turgor normal. No rashes or lesions ?Lymph nodes: Cervical, supraclavicular, and axillary nodes normal. ?No abnormal inguinal nodes palpated ?Neurologic: Grossly normal ? ? ?Pelvic: External genitalia:  no lesions ?             Urethra:  normal appearing urethra with no masses, tenderness or lesions ?             Bartholins and Skenes: normal    ?             Vagina:  normal appearing vagina with normal color and discharge, no lesions ?             Cervix: absent ?              ?Bimanual Exam:  Uterus:  uterus absent ?  Adnexa: no mass, fullness, tenderness ?              Rectovaginal: Confirms ?              Anus:  normal sphincter tone, no lesions ? ?Gae Dry chaperoned for the exam. ? ?1. Well woman exam ?Discussed breast self exam ?Discussed calcium and vit D intake ?No pap needed ?Mammogram and colonoscopy are UTD ? ?2. Encounter for monitoring postmenopausal estrogen replacement therapy ?- VIVELLE-DOT 0.075 MG/24HR; APPLY 1 PATCH TOPICALLY TO  SKIN TWICE WEEKLY  Dispense: 24 patch; Refill: 3 ? ?3. Vaginal discharge ?- WET PREP FOR [place], YEAST, CLUE ? ?

## 2021-12-16 ENCOUNTER — Other Ambulatory Visit: Payer: Self-pay | Admitting: Obstetrics and Gynecology

## 2021-12-16 DIAGNOSIS — Z7189 Other specified counseling: Secondary | ICD-10-CM

## 2021-12-16 NOTE — Telephone Encounter (Signed)
MyChart msg sent re: the neccesity for a refill until annual on 12/22/21.

## 2021-12-22 ENCOUNTER — Ambulatory Visit (INDEPENDENT_AMBULATORY_CARE_PROVIDER_SITE_OTHER): Payer: 59 | Admitting: Obstetrics and Gynecology

## 2021-12-22 ENCOUNTER — Other Ambulatory Visit: Payer: Self-pay

## 2021-12-22 ENCOUNTER — Ambulatory Visit: Payer: 59 | Admitting: Obstetrics and Gynecology

## 2021-12-22 ENCOUNTER — Encounter: Payer: Self-pay | Admitting: Obstetrics and Gynecology

## 2021-12-22 VITALS — BP 110/70 | HR 74 | Ht 63.0 in | Wt 146.0 lb

## 2021-12-22 DIAGNOSIS — Z5181 Encounter for therapeutic drug level monitoring: Secondary | ICD-10-CM | POA: Diagnosis not present

## 2021-12-22 DIAGNOSIS — N898 Other specified noninflammatory disorders of vagina: Secondary | ICD-10-CM | POA: Diagnosis not present

## 2021-12-22 DIAGNOSIS — Z79890 Hormone replacement therapy (postmenopausal): Secondary | ICD-10-CM

## 2021-12-22 DIAGNOSIS — Z01419 Encounter for gynecological examination (general) (routine) without abnormal findings: Secondary | ICD-10-CM | POA: Diagnosis not present

## 2021-12-22 LAB — WET PREP FOR TRICH, YEAST, CLUE

## 2021-12-22 MED ORDER — VIVELLE-DOT 0.075 MG/24HR TD PTTW: MEDICATED_PATCH | TRANSDERMAL | 3 refills | Status: DC

## 2021-12-22 NOTE — Patient Instructions (Signed)

## 2021-12-28 ENCOUNTER — Encounter: Payer: Self-pay | Admitting: Family

## 2021-12-28 ENCOUNTER — Telehealth: Payer: Self-pay | Admitting: Family

## 2021-12-28 ENCOUNTER — Telehealth: Payer: 59 | Admitting: Family

## 2021-12-28 DIAGNOSIS — F3111 Bipolar disorder, current episode manic without psychotic features, mild: Secondary | ICD-10-CM | POA: Diagnosis not present

## 2021-12-28 DIAGNOSIS — R1011 Right upper quadrant pain: Secondary | ICD-10-CM | POA: Diagnosis not present

## 2021-12-28 DIAGNOSIS — K219 Gastro-esophageal reflux disease without esophagitis: Secondary | ICD-10-CM

## 2021-12-28 MED ORDER — LAMOTRIGINE 25 MG PO TABS: 75.0000 mg | ORAL_TABLET | Freq: Every day | ORAL | 0 refills | Status: DC

## 2021-12-28 MED ORDER — LAMOTRIGINE 25 MG PO TABS: ORAL_TABLET | ORAL | 0 refills | Status: DC

## 2021-12-28 MED ORDER — OMEPRAZOLE 20 MG PO CPDR: 20.0000 mg | DELAYED_RELEASE_CAPSULE | Freq: Every day | ORAL | 1 refills | Status: DC

## 2021-12-28 NOTE — Progress Notes (Signed)
?Virtual Visit Consent  ? ?[name], you are scheduled for a virtual visit with a [facility] provider today.   ?  ?Just as with appointments in the office, your consent must be obtained to participate.  Your consent will be active for this visit and any virtual visit you may have with one of our providers in the next 365 days.   ?  ?If you have a MyChart account, a copy of this consent can be sent to you electronically.  All virtual visits are billed to your insurance company just like a traditional visit in the office.   ? ?As this is a virtual visit, video technology does not allow for your provider to perform a traditional examination.  This may limit your provider's ability to fully assess your condition.  If your provider identifies any concerns that need to be evaluated in person or the need to arrange testing (such as labs, EKG, etc.), we will make arrangements to do so.   ?  ?Although advances in technology are sophisticated, we cannot ensure that it will always work on either your end or our end.  If the connection with a video visit is poor, the visit may have to be switched to a telephone visit.  With either a video or telephone visit, we are not always able to ensure that we have a secure connection.    ? ?I need to obtain your verbal consent now.   Are you willing to proceed with your visit today?  ?  ?Maureen [name] has provided verbal consent on 12/28/2021 for a virtual visit (video or telephone). ?  ?Evelina Dun, FNP  ? ?Date: 12/28/2021 1:00 PM ? ? ?Virtual Visit via Video Note  ? ?Katheran Awe, connected with  Maureen [name]  ([id number], 1966-03-16) on 12/28/21 at 12:10 PM EST by a video-enabled telemedicine application and verified that I am speaking with the correct person using two identifiers. ? ?Location: ?Patient: Virtual Visit Location Patient: Other: work ?Provider: Virtual Visit Location Provider: Home Office ?  ?I discussed the limitations of evaluation and management by  telemedicine and the availability of in person appointments. The patient expressed understanding and agreed to proceed.   ? ?History of Present Illness: ?Maureen [name] is a 56 y.o. who identifies as a female who was assigned female at birth, and is being seen today for abdominal cramping and pain that has been on going for years. She reports she has hx of bipolar and stopped her medications and was doing well and was using natural medications. She was followed by behavorial health and made a follow up appt with them to establish care. However, that appointment is not until May.  ? ?HPI: Abdominal Pain ?This is a new problem. The current episode started more than 1 year ago. The onset quality is gradual. The problem occurs intermittently. The pain is located in the RUQ. The pain is moderate. The quality of the pain is cramping. The abdominal pain does not radiate. Associated symptoms include belching, constipation and nausea. Pertinent negatives include no diarrhea, dysuria, flatus, frequency, hematuria or vomiting. The pain is aggravated by eating. The pain is relieved by Eating. Treatments tried: gas x. The treatment provided mild relief.   ?Problems:  ?Patient Active Problem List  ? Diagnosis Date Noted  ? Bipolar 1 disorder, manic, mild ([place]) 12/28/2021  ? Aortic atherosclerosis ([place]) 02/08/2021  ? Vision changes 05/28/2020  ? Paresthesia 05/28/2020  ? Cervical spondylosis with radiculopathy 07/01/2016  ?  Spondylolisthesis 07/01/2016  ? Greater trochanteric bursitis of right hip 08/20/2015  ? Chronic interstitial cystitis 11/25/2014  ? H/O Spinal surgery 06/24/2014  ? Fibrositis 01/29/2014  ? Fibromyalgia 01/29/2014  ? Lumbosacral radiculitis 10/09/2013  ? Lumbar post-laminectomy syndrome 09/17/2013  ? Atypical chest pain 09/02/2013  ? Other long term (current) drug therapy 05/01/2013  ? Arthritis 04/16/2013  ? Abdominal pain, epigastric 03/26/2013  ? Back pain, chronic 12/05/2012  ? Connective tissue disease,  undifferentiated ([place]) 12/05/2012  ? Raynaud's syndrome without gangrene 12/05/2012  ? Systemic lupus erythematosus ([place]) 12/05/2012  ? Cerebrospinal fluid leak from spinal puncture 08/24/2012  ? UNSPECIFIED DISORDER OF STOMACH AND DUODENUM 08/26/2008  ?  ?Allergies:  ?Allergies  ?Allergen Reactions  ? Amoxicillin Rash  ? Penicillins Rash  ?  Has patient had a PCN reaction causing immediate rash, facial/tongue/throat swelling, SOB or lightheadedness with hypotension: yes ?Has patient had a PCN reaction causing severe rash involving mucus membranes or skin necrosis: yes pt did experience rash but it was not severe  ?Has patient had a PCN reaction that required hospitalization: no ?Has patient had a PCN reaction occurring within the last 10 years: no ?If all of the above answers are "NO", then may proceed with Cephalosporin use. ?  ? Tegretol [Carbamazepine] Rash  ? ?Medications:  ?Current Outpatient Medications:  ?  lamoTRIgine (LAMICTAL) 25 MG tablet, Take 1 tablet (25 mg total) by mouth daily for 14 days, THEN 2 tablets (50 mg total) daily for 14 days, THEN 3 tablets (75 mg total) daily for 14 days., Disp: 84 tablet, Rfl: 0 ?  [START ON 02/07/2022] lamoTRIgine (LAMICTAL) 25 MG tablet, Take 3 tablets (75 mg total) by mouth daily., Disp: 270 tablet, Rfl: 0 ?  omeprazole (PRILOSEC) 20 MG capsule, Take 1 capsule (20 mg total) by mouth daily., Disp: 90 capsule, Rfl: 1 ?  Cholecalciferol (VITAMIN D3) 5000 units CAPS, Take 1 tablet by mouth daily., Disp: , Rfl:  ?  hydrochlorothiazide (HYDRODIURIL) 12.5 MG tablet, Take 1 tablet (12.5 mg total) by mouth daily., Disp: 90 tablet, Rfl: 3 ?  magnesium oxide (MAG-OX) 400 MG tablet, Take 400 mg by mouth daily., Disp: , Rfl:  ?  Melatonin-Pyridoxine (MELATIN PO), Take by mouth., Disp: , Rfl:  ?  TURMERIC PO, Take by mouth daily., Disp: , Rfl:  ?  VIVELLE-DOT 0.075 MG/24HR, APPLY 1 PATCH TOPICALLY TO  SKIN TWICE WEEKLY, Disp: 24 patch, Rfl: 3 ? ?Observations/Objective: ?Patient is  well-developed, well-nourished in no acute distress.  ?Resting comfortably  at home.  ?Head is normocephalic, atraumatic.  ?No labored breathing.  ?Speech is clear and coherent with logical content.  ?Patient is alert and oriented at baseline.  ?Anxious ? ?Assessment and Plan: ?1. Right upper quadrant abdominal pain ?- CMP14+EGFR; Future ?- CBC with Differential/Platelet; Future ?- H Pylori, IGM, IGG, IGA AB; Future ?- omeprazole (PRILOSEC) 20 MG capsule; Take 1 capsule (20 mg total) by mouth daily.  Dispense: 90 capsule; Refill: 1 ? ?2. Gastroesophageal reflux disease, unspecified whether esophagitis present ?- CMP14+EGFR; Future ?- CBC with Differential/Platelet; Future ?- H Pylori, IGM, IGG, IGA AB; Future ?- omeprazole (PRILOSEC) 20 MG capsule; Take 1 capsule (20 mg total) by mouth daily.  Dispense: 90 capsule; Refill: 1 ? ?3. Bipolar 1 disorder, manic, mild (HCC) ?- lamoTRIgine (LAMICTAL) 25 MG tablet; Take 1 tablet (25 mg total) by mouth daily for 14 days, THEN 2 tablets (50 mg total) daily for 14 days, THEN 3 tablets (75 mg total) daily for 14  days.  Dispense: 84 tablet; Refill: 0 ? ?Labs pending  ?Avoid NSAID's  ?Bland diet ?Will to start Lamictal, keep follow up with [facility] ?Stress management  ?RTO 1 month  ? ?Follow Up Instructions: ?I discussed the assessment and treatment plan with the patient. The patient was provided an opportunity to ask questions and all were answered. The patient agreed with the plan and demonstrated an understanding of the instructions.  A copy of instructions were sent to the patient via MyChart unless otherwise noted below.  ? ? ? ?The patient was advised to call back or seek an in-person evaluation if the symptoms worsen or if the condition fails to improve as anticipated. ? ?Time:  ?I spent 18 minutes with the patient via telehealth technology discussing the above problems/concerns.   ? ?Evelina Dun, FNP ? ?

## 2021-12-28 NOTE — Patient Instructions (Addendum)
Peptic Ulcer ?A peptic ulcer is a sore in the lining of the stomach (gastric ulcer) or the first part of the small intestine (duodenal ulcer). The ulcer causes a gradual wearing away (erosion) of the deeper tissue. ?What are the causes? ?Normally, the lining of the stomach and the small intestine protects them from the acid that digests food. The protective lining can be damaged by: ?An infection caused by a type of bacteria called Helicobacter pylori or H. pylori. ?Regular use of NSAIDs, such as ibuprofen or aspirin. ?Rare tumors in the stomach, small intestine, or pancreas (Zollinger-Ellison syndrome). ?What increases the risk? ?The following factors may make you more likely to develop this condition: ?Smoking. ?Having a family history of ulcer disease. ?Drinking alcohol. ?Having been hospitalized in an intensive care unit (ICU). ?What are the signs or symptoms? ?Symptoms of this condition include: ?Persistent burning pain in the area between the chest and the belly button. The pain may be worse on an empty stomach and at night. ?Heartburn. ?Nausea and vomiting. ?Bloating. ?If the ulcer results in bleeding, it can cause: ?Black, tarry stools. ?Vomiting of bright red blood. ?Vomiting of material that looks like coffee grounds. ?How is this diagnosed? ?This condition may be diagnosed based on: ?Your medical history and a physical exam. ?Various tests or procedures, such as: ?Upper endoscopy. The health care provider examines the esophagus, stomach, and small intestine using a small flexible tube that has a video camera at the end. ?Blood tests, stool tests, or breath tests to check for the H. pylori bacteria. ?An X-ray exam (upper gastrointestinal series) of the esophagus, stomach, and small intestine. ?A biopsy to help find certain causes of ulcers. A tissue sample is removed during upper endoscopy to be examined under a microscope. ?How is this treated? ?Treatment for this condition may include: ?Eliminating the  cause of the ulcer, such as smoking or use of NSAIDs, and limiting alcohol and caffeine intake. ?Medicines to reduce the amount of acid in your digestive tract. ?Antibiotic medicines, if the ulcer is caused by an H. pylori infection. ?An upper endoscopy may be used to treat a bleeding ulcer. ?Surgery. This may be needed if the bleeding is severe or if the ulcer created a hole somewhere in the digestive system. ?Follow these instructions at home: ?Do not drink alcohol if your health care provider tells you not to drink. ?Do not use any products that contain nicotine or tobacco. These products include cigarettes, chewing tobacco, and vaping devices, such as e-cigarettes. If you need help quitting, ask your health care provider. ?Take over-the-counter and prescription medicines only as told by your health care provider. ?Do not use over-the-counter medicines in place of prescription medicines unless your health care provider approves. ?Do not take aspirin, ibuprofen, or other NSAIDs unless your health care provider tells you to. ?Keep all follow-up visits. This is important. ?Contact a health care provider if: ?Your symptoms do not improve within 7 days of starting treatment. ?You have ongoing indigestion or heartburn. ?Get help right away if: ?You have sudden, sharp, or persistent pain in your abdomen. ?You have bloody or dark black, tarry stools. ?You vomit blood or material that looks like coffee grounds. ?You become light-headed or you feel faint. ?You become weak. ?You become sweaty or clammy. ?These symptoms may be an emergency. Get help right away. Call 911. ?Do not wait to see if the symptoms will go away. ?Do not drive yourself to the hospital. ?Summary ?A peptic ulcer is a sore in  the lining of the stomach (gastric ulcer) or the first part of the small intestine (duodenal ulcer). The ulcer causes a gradual wearing away (erosion) of the deeper tissue. ?Do not use any products that contain nicotine or tobacco.  These products include cigarettes, chewing tobacco, and vaping devices, such as e-cigarettes. If you need help quitting, ask your health care provider. ?Take over-the-counter and prescription medicines only as told by your health care provider. Do not use over-the-counter medicines in place of prescription medicines unless your health care provider approves. ?Limit your alcohol and caffeine intake. ?Keep all follow-up visits. This is important. ?This information is not intended to replace advice given to you by your health care provider. Make sure you discuss any questions you have with your health care provider. ?Document Revised: 05/21/2021 Document Reviewed: 05/21/2021 ?Elsevier Patient Education ? [place]. ? ?

## 2021-12-28 NOTE — Telephone Encounter (Signed)
WORK IN [facility] APPT TODAY PT AWARE  ?

## 2021-12-28 NOTE — Telephone Encounter (Signed)
Pt called to cancel her appt with PCP for tomorrow at 3:25.. Says she cant leave work now due to having to cover for sick coworker. ? ?Wants to know if she can be worked in for earliest morning appt sometime this week. ? ?Says she needs referral to see GI specialist and also wants to talk with PCP about being prescribed a medicine for her bipolar until she can see a specialist for that, which she already has an appt for, but they cant see her until May. ? ?Please advise and call patient.  ?

## 2021-12-29 ENCOUNTER — Ambulatory Visit: Payer: 59 | Admitting: Family

## 2021-12-29 ENCOUNTER — Encounter: Payer: Self-pay | Admitting: Obstetrics and Gynecology

## 2021-12-29 ENCOUNTER — Other Ambulatory Visit: Payer: 59

## 2021-12-29 DIAGNOSIS — R1011 Right upper quadrant pain: Secondary | ICD-10-CM

## 2021-12-29 DIAGNOSIS — K219 Gastro-esophageal reflux disease without esophagitis: Secondary | ICD-10-CM

## 2021-12-30 LAB — CBC WITH DIFFERENTIAL/PLATELET
Basophils Absolute: 0 10*3/uL (ref 0.0–0.2)
Basos: 1 %
EOS (ABSOLUTE): 0 10*3/uL (ref 0.0–0.4)
Eos: 1 %
Hematocrit: 39.4 % (ref 34.0–46.6)
Hemoglobin: 13.3 g/dL (ref 11.1–15.9)
Immature Grans (Abs): 0 10*3/uL (ref 0.0–0.1)
Immature Granulocytes: 0 %
Lymphocytes Absolute: 1 10*3/uL (ref 0.7–3.1)
Lymphs: 37 %
MCH: 31.7 pg (ref 26.6–33.0)
MCHC: 33.8 g/dL (ref 31.5–35.7)
MCV: 94 fL (ref 79–97)
Monocytes Absolute: 0.2 10*3/uL (ref 0.1–0.9)
Monocytes: 9 %
Neutrophils Absolute: 1.4 10*3/uL (ref 1.4–7.0)
Neutrophils: 52 %
Platelets: 232 10*3/uL (ref 150–450)
RBC: 4.19 x10E6/uL (ref 3.77–5.28)
RDW: 12.5 % (ref 11.7–15.4)
WBC: 2.8 10*3/uL — ABNORMAL LOW (ref 3.4–10.8)

## 2021-12-30 LAB — H PYLORI, IGM, IGG, IGA AB
H pylori, IgM Abs: <9 units (ref 0.0–8.9)
H. pylori, IgA Abs: <9 units (ref 0.0–8.9)
H. pylori, IgG AbS: 0.35 Index Value (ref 0.00–0.79)

## 2021-12-30 LAB — CMP14+EGFR
ALT: 18 IU/L (ref 0–32)
AST: 23 IU/L (ref 0–40)
Albumin/Globulin Ratio: 2.2 (ref 1.2–2.2)
Albumin: 4.3 g/dL (ref 3.8–4.9)
Alkaline Phosphatase: 47 IU/L (ref 44–121)
BUN/Creatinine Ratio: 13 (ref 9–23)
BUN: 9 mg/dL (ref 6–24)
Bilirubin Total: 0.4 mg/dL (ref 0.0–1.2)
CO2: 27 mmol/L (ref 20–29)
Calcium: 9.4 mg/dL (ref 8.7–10.2)
Chloride: 100 mmol/L (ref 96–106)
Creatinine, Ser: 0.72 mg/dL (ref 0.57–1.00)
Globulin, Total: 2 g/dL (ref 1.5–4.5)
Glucose: 68 mg/dL — ABNORMAL LOW (ref 70–99)
Potassium: 4.2 mmol/L (ref 3.5–5.2)
Sodium: 139 mmol/L (ref 134–144)
Total Protein: 6.3 g/dL (ref 6.0–8.5)
eGFR: 99 mL/min/{1.73_m2} (ref >59)

## 2022-01-03 ENCOUNTER — Other Ambulatory Visit: Payer: Self-pay | Admitting: Family

## 2022-01-03 DIAGNOSIS — R1011 Right upper quadrant pain: Secondary | ICD-10-CM

## 2022-01-12 ENCOUNTER — Telehealth: Payer: Self-pay | Admitting: Gastroenterology

## 2022-01-12 ENCOUNTER — Encounter: Payer: Self-pay | Admitting: Gastroenterology

## 2022-01-12 NOTE — Telephone Encounter (Signed)
Good Morning Dr. Ardis Hughs, ? ? ?Patient called wanting to schedule an appointment for RUQ pain. After looking in her chart it looks like you seen her In 2009 and since then she has been seen by [facility]. Records are in Epic, can you review them and advise on scheduling? ? ?Thank you.   ?

## 2022-01-12 NOTE — Telephone Encounter (Signed)
Patient was scheduled for an OV with you on 4/19 at 10:10. ? ?Thank you! ?

## 2022-01-13 ENCOUNTER — Encounter: Payer: Self-pay | Admitting: Family Medicine

## 2022-01-13 ENCOUNTER — Ambulatory Visit: Payer: 59 | Admitting: Family Medicine

## 2022-01-13 DIAGNOSIS — J988 Other specified respiratory disorders: Secondary | ICD-10-CM

## 2022-01-13 DIAGNOSIS — B9689 Other specified bacterial agents as the cause of diseases classified elsewhere: Secondary | ICD-10-CM

## 2022-01-13 MED ORDER — AZITHROMYCIN 250 MG PO TABS: ORAL_TABLET | ORAL | 0 refills | Status: DC

## 2022-01-13 NOTE — Progress Notes (Signed)
? ?Virtual Visit via Telephone Note ? ?I connected with Maureen [name] on 01/13/22 at 8:58 AM by telephone and verified that I am speaking with the correct person using two identifiers. Maureen [name] is currently located at home and nobody is currently with her during this visit. The provider, Loman Brooklyn, FNP is located in their office at time of visit. ? ?I discussed the limitations, risks, security and privacy concerns of performing an evaluation and management service by telephone and the availability of in person appointments. I also discussed with the patient that there may be a patient responsible charge related to this service. The patient expressed understanding and agreed to proceed. ? ?Subjective: ?PCP: Sharion Balloon, FNP ? ?Chief Complaint  ?Patient presents with  ? URI  ? ?Patient complains of cough, head/chest congestion, headache, runny nose, sneezing, sore throat, facial pain/pressure, and fever. Onset of symptoms was 1 week ago, unchanged since that time. She is drinking plenty of fluids. Evaluation to date: at home COVID test negative. Treatment to date:  OTC cold medications . Patient has a history of Lupus. She does not smoke.  ? ? ?ROS: Per HPI ? ?Current Outpatient Medications:  ?  Cholecalciferol (VITAMIN D3) 5000 units CAPS, Take 1 tablet by mouth daily., Disp: , Rfl:  ?  hydrochlorothiazide (HYDRODIURIL) 12.5 MG tablet, Take 1 tablet (12.5 mg total) by mouth daily., Disp: 90 tablet, Rfl: 3 ?  lamoTRIgine (LAMICTAL) 25 MG tablet, Take 1 tablet (25 mg total) by mouth daily for 14 days, THEN 2 tablets (50 mg total) daily for 14 days, THEN 3 tablets (75 mg total) daily for 14 days., Disp: 84 tablet, Rfl: 0 ?  [START ON 02/07/2022] lamoTRIgine (LAMICTAL) 25 MG tablet, Take 3 tablets (75 mg total) by mouth daily., Disp: 270 tablet, Rfl: 0 ?  magnesium oxide (MAG-OX) 400 MG tablet, Take 400 mg by mouth daily., Disp: , Rfl:  ?  Melatonin-Pyridoxine (MELATIN PO), Take by mouth., Disp: , Rfl:   ?  omeprazole (PRILOSEC) 20 MG capsule, Take 1 capsule (20 mg total) by mouth daily., Disp: 90 capsule, Rfl: 1 ?  TURMERIC PO, Take by mouth daily., Disp: , Rfl:  ?  VIVELLE-DOT 0.075 MG/24HR, APPLY 1 PATCH TOPICALLY TO  SKIN TWICE WEEKLY, Disp: 24 patch, Rfl: 3 ? ?Allergies  ?Allergen Reactions  ? Amoxicillin Rash  ? Penicillins Rash  ?  Has patient had a PCN reaction causing immediate rash, facial/tongue/throat swelling, SOB or lightheadedness with hypotension: yes ?Has patient had a PCN reaction causing severe rash involving mucus membranes or skin necrosis: yes pt did experience rash but it was not severe  ?Has patient had a PCN reaction that required hospitalization: no ?Has patient had a PCN reaction occurring within the last 10 years: no ?If all of the above answers are "NO", then may proceed with Cephalosporin use. ?  ? Tegretol [Carbamazepine] Rash  ? ?Past Medical History:  ?Diagnosis Date  ? Abdominal pain   ? Anxiety   ? takes Xanax daily as needed  ? Arthritis   ? Cancer [facility])   ? MOLE PRE  ? Cerebrospinal fluid leak from spinal puncture 08/24/2012  ? Chronic back pain   ? Constipation   ? takes Dulcolax and Miralax daily as needed;also Fiber Pills  ? Depression   ? takes Lamictal daily  ? Fibromyalgia   ? GERD (gastroesophageal reflux disease)   ? takes Omeprazole daily  ? Headache(784.0)   ? Heart murmur   ?  slight  ? History of bronchitis   ? last time many yrs ago(1yr ago)  ? Internal hemorrhoid   ? Interstitial cystitis   ? no problems since beginning of Jan 2014  ? Joint pain   ? Joint swelling   ? Lupus ([place]   ? takes Plaquenil daily  ? Muscle spasm   ? takes Robaxin daily as needed  ? Numbness and tingling in hands   ? Osteoarthritis   ? Pneumonia   ? hx of > 837yrago  ? Raynaud's disease   ? Urinary frequency   ? Urinary urgency   ? ? ?Observations/Objective: ?A&O  ?No respiratory distress or wheezing audible over the phone ?Mood, judgement, and thought processes all WNL ? ?Assessment and  Plan: ?1. Bacterial respiratory infection ?Discussed symptom management. ?- azithromycin (ZITHROMAX Z-PAK) 250 MG tablet; Take 2 tablets (500 mg) PO today, then 1 tablet (250 mg) PO daily x4 days.  Dispense: 6 tablet; Refill: 0 ? ? ?Follow Up Instructions: ? ?I discussed the assessment and treatment plan with the patient. The patient was provided an opportunity to ask questions and all were answered. The patient agreed with the plan and demonstrated an understanding of the instructions. ?  ?The patient was advised to call back or seek an in-person evaluation if the symptoms worsen or if the condition fails to improve as anticipated. ? ?The above assessment and management plan was discussed with the patient. The patient verbalized understanding of and has agreed to the management plan. Patient is aware to call the clinic if symptoms persist or worsen. Patient is aware when to return to the clinic for a follow-up visit. Patient educated on when it is appropriate to go to the emergency department.  ? ?Time call ended: 9:09 AM ? ?I provided 11 minutes of non-face-to-face time during this encounter. ? ?BrHendricks LimesMSN, APRN, FNP-C ?[employer]03/23/23 ?

## 2022-01-17 ENCOUNTER — Other Ambulatory Visit: Payer: Self-pay | Admitting: Obstetrics and Gynecology

## 2022-01-17 DIAGNOSIS — Z5181 Encounter for therapeutic drug level monitoring: Secondary | ICD-10-CM

## 2022-01-17 NOTE — Telephone Encounter (Signed)
Rx just sent on 12/22/21.  ?Pharmacy requesting generic due to insurance.  ?

## 2022-01-25 ENCOUNTER — Telehealth: Payer: Self-pay | Admitting: *Deleted

## 2022-01-25 NOTE — Telephone Encounter (Signed)
Patient called and left message in triage voicemail stating wrong estradiol (vivelle dot patch) dose was sent in on 01/18/22. Per office note patient is using vivelle dot (generic ) 0.075 mg, however this dose was d/c'd when refill was sent from pharmacy and 0.0375 mg dose was approved. Patient said she is confused why the dose was changed?  Okay to send in 0.075 mg dose? ?

## 2022-01-25 NOTE — Telephone Encounter (Signed)
Yes, please apologize to the patient and send in the correct dose. I'm not sure where the error happened ?

## 2022-01-26 MED ORDER — ESTRADIOL 0.075 MG/24HR TD PTTW: 1.0000 | MEDICATED_PATCH | TRANSDERMAL | 3 refills | Status: DC

## 2022-01-26 NOTE — Telephone Encounter (Signed)
Patient informed, Rx sent apology given to patient. ?

## 2022-02-09 ENCOUNTER — Ambulatory Visit: Payer: 59 | Admitting: Gastroenterology

## 2022-02-09 ENCOUNTER — Encounter: Payer: Self-pay | Admitting: Gastroenterology

## 2022-02-09 VITALS — BP 110/78 | HR 64 | Resp 99 | Ht 63.0 in | Wt 145.4 lb

## 2022-02-09 DIAGNOSIS — R1013 Epigastric pain: Secondary | ICD-10-CM

## 2022-02-09 NOTE — Progress Notes (Signed)
I do not mean to pester you about that I just like to generally be aware of things review of pertinent gastrointestinal problems: ?1.  Unusual surgical finding.  "elevated white plaque seen on the lateral duodenal mesenteric junction" during 2009 lap chole for biliary dyskesia with Dr.Matt Hassell Done.  Followed by 2009 endoscopic ultrasound evaluation by me which was completely normal. ?2.  Routine risk for colon cancer: 07/2014 colonoscopy outside system [facility] )for constipation found essentially normal colon, single subcentimeter polyp was biopsied and it was hyperplastic.  Per Dr. At that time recommended repeat colon cancer screening at 10-year interval. ? ? ?HPI: ?This is a very pleasant 56 year old woman who was referred to me by Sharion Balloon, FNP  to evaluate epigastric discomforts, right upper quadrant discomforts.   ? ?She has had intermittent mid back pains for years.  In the past year or so she has also developed epigastric discomforts and right upper quadrant burning.  Her PCP started her on omeprazole which she takes 20 mg pills 1 pill at bedtime every night and this has definitely helped the right upper quadrant burning. ? ?She has no pyrosis, no dysphagia.  Her weight is overall stable. ? ?She rarely takes NSAIDs. ? ?She describes "severe gastritis attacks" around the times of lupus flares and steroids for her lupus flares.  By this she means a "twisting sensation in her epigastrium.  She also feels sometimes that her intestines are twisting.  She feels that sometimes her GI tract just "stops working".  She will have a lot of gassiness and push on her stomach and belch. ? ?Looks like she was a patient at [facility] Dr. Laural Golden around 2014.  I believe she underwent an upper endoscopy around then for epigastric pain. ? ? ?Blood work 12/2021 CBC was normal except for slightly low white blood cell count, complete metabolic profile was normal, H. pylori serologies were all negative. ? ? ? ?Review  of systems: ?Pertinent positive and negative review of systems were noted in the above HPI section. All other review negative. ? ? ?Past Medical History:  ?Diagnosis Date  ? Abdominal pain   ? Anxiety   ? takes Xanax daily as needed  ? Arthritis   ? Cancer [facility])   ? MOLE PRE  ? Cerebrospinal fluid leak from spinal puncture 08/24/2012  ? Chronic back pain   ? Constipation   ? takes Dulcolax and Miralax daily as needed;also Fiber Pills  ? Depression   ? takes Lamictal daily  ? Fibromyalgia   ? GERD (gastroesophageal reflux disease)   ? takes Omeprazole daily  ? Headache(784.0)   ? Heart murmur   ? slight  ? History of bronchitis   ? last time many yrs ago(3yr ago)  ? Internal hemorrhoid   ? Interstitial cystitis   ? no problems since beginning of Jan 2014  ? Joint pain   ? Joint swelling   ? [place]us ([place]   ? takes Plaquenil daily  ? Muscle spasm   ? takes Robaxin daily as needed  ? Numbness and tingling in hands   ? Osteoarthritis   ? Pneumonia   ? hx of > 890yrago  ? Raynaud's disease   ? Urinary frequency   ? Urinary urgency   ? ? ?Past Surgical History:  ?Procedure Laterality Date  ? ABDOMINAL HERNIA REPAIR    ? APPENDECTOMY    ? CESAREAN SECTION    ? CHOLECYSTECTOMY    ? COLONOSCOPY    ?  DIAGNOSTIC LAPAROSCOPY    ? adhesions  ? ENDOMETRIAL ABLATION    ? ESOPHAGOGASTRODUODENOSCOPY N/A 05/08/2013  ? Procedure: ESOPHAGOGASTRODUODENOSCOPY (EGD);  Surgeon: Rogene Houston, MD;  [facility];  Service: Endoscopy;  Laterality: N/A;  315  ? NASAL SEPTUM SURGERY    ? OVARIAN CYST REMOVAL    ? SHOULDER SURGERY Right   ? x 2  ? SPINAL CORD STIMULATOR INSERTION N/A 06/13/2014  ? Procedure: LUMBAR SPINAL CORD STIMULATOR INSERTION;  Surgeon: Bonna Gains, MD;  Location: [facility];  Service: Neurosurgery;  Laterality: N/A;  ? SPINAL FUSION    ? x 2   ? TONSILLECTOMY    ? TUBAL LIGATION    ? uterine ablation    ? VAGINAL HYSTERECTOMY  8/12  ? ? ?Current Outpatient Medications  ?Medication Instructions  ?  Cholecalciferol (VITAMIN D3) 5000 units CAPS 1 tablet, Oral, Daily  ? estradiol (VIVELLE-DOT) 0.075 MG/24HR 1 patch, Transdermal, 2 times weekly  ? hydrochlorothiazide (HYDRODIURIL) 12.5 mg, Oral, Daily  ? magnesium oxide (MAG-OX) 400 mg, Oral, Daily  ? Melatonin-Pyridoxine (MELATIN PO) 1 tablet, Oral, At bedtime PRN  ? omeprazole (PRILOSEC) 20 mg, Oral, Daily  ? TURMERIC PO 1 tablet, Oral, Daily  ? ? ?Allergies as of 02/09/2022 - Review Complete 02/09/2022  ?Allergen Reaction Noted  ? Amoxicillin Rash 01/29/2014  ? Penicillins Rash 04/08/2011  ? Tegretol [carbamazepine] Rash 11/19/2013  ? ? ?Family History  ?Problem Relation Age of Onset  ? Hyperlipidemia Mother   ? Depression Mother   ? Anxiety disorder Mother   ? Colon polyps Father   ? Ovarian cancer Maternal Aunt   ? Colon cancer Maternal Uncle   ? Colon cancer Paternal Aunt   ? Breast cancer Maternal Grandmother   ? Alzheimer's disease Maternal Grandmother   ? Esophageal cancer Neg Hx   ? Pancreatic cancer Neg Hx   ? Stomach cancer Neg Hx   ? ? ?Social History  ? ?Socioeconomic History  ? Marital status: Legally Separated  ?  Spouse name: Not on file  ? Number of children: Not on file  ? Years of education: Not on file  ? Highest education level: Not on file  ?Occupational History  ? Not on file  ?Tobacco Use  ? Smoking status: Former  ?  Packs/day: 0.50  ?  Years: 20.00  ?  Pack years: 10.00  ?  Types: Cigarettes  ?  Quit date: 06/09/2008  ?  Years since quitting: 13.6  ? Smokeless tobacco: Never  ?Vaping Use  ? Vaping Use: Never used  ?Substance and Sexual Activity  ? Alcohol use: Yes  ? Drug use: No  ? Sexual activity: Not Currently  ?  Partners: Male  ?  Birth control/protection: Surgical  ?Other Topics Concern  ? Not on file  ?Social History Narrative  ? Not on file  ? ?Social Determinants of Health  ? ?Financial Resource Strain: Not on file  ?Food Insecurity: Not on file  ?Transportation Needs: Not on file  ?Physical Activity: Not on file  ?Stress: Not on  file  ?Social Connections: Not on file  ?Intimate Partner Violence: Not on file  ? ? ? ?Physical Exam: ?BP 110/78   Pulse 64   Resp (!) 99   Ht '5\' 3"'$  (1.6 m)   Wt 145 lb 6 oz (65.9 kg)   BMI 25.75 kg/m?  ?Constitutional: generally well-appearing ?Psychiatric: alert and oriented x3 ?Eyes: extraocular movements intact ?Mouth: oral pharynx moist, no lesions ?  Neck: supple no lymphadenopathy ?Cardiovascular: heart regular rate and rhythm ?Lungs: clear to auscultation bilaterally ?Abdomen: soft, nontender, nondistended, no obvious ascites, no peritoneal signs, normal bowel sounds ?Extremities: no lower extremity edema bilaterally ?Skin: no lesions on visible extremities ? ? ?Assessment and plan: ?56 y.o. female with epigastric/right upper quadrant burning, pains ? ?I explained to her that these are likely unrelated to her back pains.  The burning discomforts are definitely improved since she started taking omeprazole 20 mg once daily.  I explained to her that that is a good sign that her symptoms are somehow related to acid in her stomach.  I encouraged her to continue the omeprazole since it is helping her feel better however I think it is probably best that she take her medicine 20 to 30 minutes prior to her breakfast meal rather than at bedtime as that is the way the pills designed to work most effectively.  I recommended upper endoscopy for further evaluation, check for H. pylori infection, check for peptic ulcer disease, check for significant gastritis or esophagitis. ? ? ?Please see the "Patient Instructions" section for addition details about the plan. ? ? ?Owens Loffler, MD ?[facility] ?02/09/2022, 10:24 AM ? ?Cc: Sharion Balloon, FNP ? ?Total time on date of encounter was 46  minutes (this included time spent preparing to see the patient reviewing records; obtaining and/or reviewing separately obtained history; performing a medically appropriate exam and/or evaluation; counseling and educating  the patient and family if present; ordering medications, tests or procedures if applicable; and documenting clinical information in the health record). ? ? ?

## 2022-02-09 NOTE — Patient Instructions (Addendum)
If you are age 55 or younger, your body mass index should be between 19-25. Your Body mass index is 25.75 kg/m?Marland Kitchen If this is out of the aformentioned range listed, please consider follow up with your Primary Care Provider.  ?________________________________________________________ ? ?The [facility] providers would like to encourage you to use [facility] to communicate with providers for non-urgent requests or questions.  Due to long hold times on the telephone, sending your provider a message by [facility] may be a faster and more efficient way to get a response.  Please allow 48 business hours for a response.  Please remember that this is for non-urgent requests.  ?_______________________________________________________ ? ?You have been scheduled for an endoscopy. Please follow written instructions given to you at your visit today. ?If you use inhalers (even only as needed), please bring them with you on the day of your procedure. ? ?Due to recent changes in healthcare laws, you may see the results of your imaging and laboratory studies on MyChart before your provider has had a chance to review them.  We understand that in some cases there may be results that are confusing or concerning to you. Not all laboratory results come back in the same time frame and the provider may be waiting for multiple results in order to interpret others.  Please give [place] 48 hours in order for your provider to thoroughly review all the results before contacting the office for clarification of your results.  ? ?TAKE Omeprazole 30 minutes prior to breakfast meal each day.  ? ?Thank you for entrusting me with your care and choosing [facility]. ? ?Dr Ardis Hughs ? ?

## 2022-02-22 ENCOUNTER — Encounter (HOSPITAL_COMMUNITY): Payer: Self-pay | Admitting: Psychiatry

## 2022-02-22 ENCOUNTER — Telehealth (HOSPITAL_BASED_OUTPATIENT_CLINIC_OR_DEPARTMENT_OTHER): Payer: 59 | Admitting: Psychiatry

## 2022-02-22 VITALS — Wt 145.0 lb

## 2022-02-22 DIAGNOSIS — F902 Attention-deficit hyperactivity disorder, combined type: Secondary | ICD-10-CM

## 2022-02-22 DIAGNOSIS — F3111 Bipolar disorder, current episode manic without psychotic features, mild: Secondary | ICD-10-CM | POA: Diagnosis not present

## 2022-02-22 MED ORDER — ARIPIPRAZOLE 2 MG PO TABS: 2.0000 mg | ORAL_TABLET | Freq: Every day | ORAL | 0 refills | Status: DC

## 2022-02-22 NOTE — Progress Notes (Addendum)
Patient Location: In car. ?Provider Location: Home office ? ? ?Virtual Visit via Video Note ?  ?I connected with Maureen [name] by video and verified that I am talking with the correct person using two identifiers. ?  ?I discussed the limitations, risks, security and privacy concerns of performing an evaluation and management service by video and the availability of in person appointments. I also discussed with the patient that there may be a patient responsible charge related to this service. The patient expressed understanding and agreed to proceed.  ? ? ? ?[facility] MD VIRTUAL PROGRESS NOTE  ? ?02/22/2022 1:09 PM ?Maureen [name]  ?MRN:  [id number] ? ? ? ?HPI: Patient is a 56 year old Caucasian, employed separated woman who is known to our practice from the past.  The patient was seen in virtually video.  She was in her car.  She was last seen December 2020.  At that time she was taking Lamictal 25 mg and Xanax as needed.  She had decided to come off from the medication because it was causing issues with her lupus.  She was getting a lot of sensitivity from the sun.  Later on she decided to come off from her lupus medication Plaquenil.  Lately she noticed her symptoms are coming back.  She is more having manic-like symptoms with impulsive buying, excessive spending, poor sleep, mood lability and more emotional.  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  She noticed now that she has difficulty in concentration, attention and multitasking.  Recently her primary care started her on Lamictal again but after 2 weeks she stopped because it was causing issues and flareup of lupus.  She is scared to take Lamictal.  She is more interested to try ADHD medication.  In the past she has given in her early age but do not remember the details.  She is still take leftover Xanax once in a while which was given in 2020.  She admitted getting easily irritable, distracted.  She is also very sad about her 25 year old  daughter who was involved in an accident and have paralysis on one side.  She is slowly and gradually recovering.  She is hoping that she will get better eventually.  She is also started dating and so far things are going well.  She reported she had to take a loan from her 35 because she accumulated a lot of debt which she has to pay off.  She admitted people have noticed that she gets easily frustrated, angry and moody.  She is going to cruise with her mother which is coming up.  She like to address her mood symptoms and ADHD symptoms.  She is off from lupus medicine but not sure she may need to restart taking it as symptoms sometimes not fully under control.  She has been sober from drugs for more than 16 years.  Her appetite is okay.  Her weight is stable.  She continues to hike whenever she gets time.  She denies any panic attack.  She like to try Abilify as she had done research to help her mood.  She had tried in the past but do not remember the effects. ? ?Visit Diagnosis:  ?  ICD-10-CM   ?1. Bipolar 1 disorder, manic, mild ([place])  F31.11   ?  ?2. ADHD (attention deficit hyperactivity disorder), combined type  F90.2   ?  ? ? ?Past Psychiatric History: ?H/O drug addiction, ADHD and bipolar disorder. H/O overdose and  inpatient at age 46. H/O brief ER visit few years later after she threatened to kill herself.  H/O mania, excessive spending, impulsive behavior and depression. Tried lithium, Prozac, Zyprexa, Seroquel, Abilify and Lamictal. Sensitive with higher doses. Developed mania after taking antidepressant.  Saw provider at [place] [facility] [place]. ? ?Past Medical History:  ?Past Medical History:  ?Diagnosis Date  ? Abdominal pain   ? Anxiety   ? takes Xanax daily as needed  ? Arthritis   ? Cancer [facility])   ? MOLE PRE  ? Cerebrospinal fluid leak from spinal puncture 08/24/2012  ? Chronic back pain   ? Constipation   ? takes Dulcolax and Miralax daily as  needed;also Fiber Pills  ? Depression   ? takes Lamictal daily  ? Fibromyalgia   ? GERD (gastroesophageal reflux disease)   ? takes Omeprazole daily  ? Headache(784.0)   ? Heart murmur   ? slight  ? History of bronchitis   ? last time many yrs ago(65yr ago)  ? Internal hemorrhoid   ? Interstitial cystitis   ? no problems since beginning of Jan 2014  ? Joint pain   ? Joint swelling   ? Lupus ([place]   ? takes Plaquenil daily  ? Muscle spasm   ? takes Robaxin daily as needed  ? Numbness and tingling in hands   ? Osteoarthritis   ? Pneumonia   ? hx of > 89yrago  ? Raynaud's disease   ? Urinary frequency   ? Urinary urgency   ?  ?Past Surgical History:  ?Procedure Laterality Date  ? ABDOMINAL HERNIA REPAIR    ? APPENDECTOMY    ? CESAREAN SECTION    ? CHOLECYSTECTOMY    ? COLONOSCOPY    ? DIAGNOSTIC LAPAROSCOPY    ? adhesions  ? ENDOMETRIAL ABLATION    ? ESOPHAGOGASTRODUODENOSCOPY N/A 05/08/2013  ? Procedure: ESOPHAGOGASTRODUODENOSCOPY (EGD);  Surgeon: NaRogene HoustonMD;  [facility];  Service: Endoscopy;  Laterality: N/A;  315  ? NASAL SEPTUM SURGERY    ? OVARIAN CYST REMOVAL    ? SHOULDER SURGERY Right   ? x 2  ? SPINAL CORD STIMULATOR INSERTION N/A 06/13/2014  ? Procedure: LUMBAR SPINAL CORD STIMULATOR INSERTION;  Surgeon: PaBonna GainsMD;  [facility];  Service: Neurosurgery;  Laterality: N/A;  ? SPINAL FUSION    ? x 2   ? TONSILLECTOMY    ? TUBAL LIGATION    ? uterine ablation    ? VAGINAL HYSTERECTOMY  8/12  ? ? ?Family Psychiatric History: Reviewed. ? ?Family History:  ?Family History  ?Problem Relation Age of Onset  ? Hyperlipidemia Mother   ? Depression Mother   ? Anxiety disorder Mother   ? Colon polyps Father   ? Ovarian cancer Maternal Aunt   ? Colon cancer Maternal Uncle   ? Colon cancer Paternal Aunt   ? Breast cancer Maternal Grandmother   ? Alzheimer's disease Maternal Grandmother   ? Esophageal cancer Neg Hx   ? Pancreatic cancer Neg Hx   ? Stomach cancer Neg Hx   ? ? ?Social  History:  ?Social History  ? ?Socioeconomic History  ? Marital status: Legally Separated  ?  Spouse name: Not on file  ? Number of children: Not on file  ? Years of education: Not on file  ? Highest education level: Not on file  ?Occupational History  ? Not on file  ?  Tobacco Use  ? Smoking status: Former  ?  Packs/day: 0.50  ?  Years: 20.00  ?  Pack years: 10.00  ?  Types: Cigarettes  ?  Quit date: 06/09/2008  ?  Years since quitting: 13.7  ? Smokeless tobacco: Never  ?Vaping Use  ? Vaping Use: Never used  ?Substance and Sexual Activity  ? Alcohol use: Yes  ? Drug use: No  ? Sexual activity: Not Currently  ?  Partners: Male  ?  Birth control/protection: Surgical  ?Other Topics Concern  ? Not on file  ?Social History Narrative  ? Not on file  ? ?Social Determinants of Health  ? ?Financial Resource Strain: Not on file  ?Food Insecurity: Not on file  ?Transportation Needs: Not on file  ?Physical Activity: Not on file  ?Stress: Not on file  ?Social Connections: Not on file  ? ? ?Allergies:  ?Allergies  ?Allergen Reactions  ? Amoxicillin Rash  ? Penicillins Rash  ?  Has patient had a PCN reaction causing immediate rash, facial/tongue/throat swelling, SOB or lightheadedness with hypotension: yes ?Has patient had a PCN reaction causing severe rash involving mucus membranes or skin necrosis: yes pt did experience rash but it was not severe  ?Has patient had a PCN reaction that required hospitalization: no ?Has patient had a PCN reaction occurring within the last 10 years: no ?If all of the above answers are "NO", then may proceed with Cephalosporin use. ?  ? Tegretol [Carbamazepine] Rash  ? ? ?Metabolic Disorder Labs: ?No results found for: HGBA1C, MPG ?No results found for: PROLACTIN ?Lab Results  ?Component Value Date  ? CHOL 207 (H) 01/18/2021  ? TRIG 63 01/18/2021  ? HDL 95 01/18/2021  ? CHOLHDL 2.2 01/18/2021  ? LDLCALC 101 (H) 01/18/2021  ? ?Lab Results  ?Component Value Date  ? TSH 1.000 02/18/2015  ? ? ?Therapeutic  Level Labs: ?No results found for: LITHIUM ?No results found for: VALPROATE ?No components found for:  CBMZ ? ?Current Medications: ?Current Outpatient Medications  ?Medication Sig Dispense Refill  ? Cholecalcife

## 2022-02-27 ENCOUNTER — Encounter: Payer: Self-pay | Admitting: Certified Registered Nurse Anesthetist

## 2022-02-28 ENCOUNTER — Telehealth (HOSPITAL_COMMUNITY): Payer: Self-pay | Admitting: Psychiatry

## 2022-03-03 ENCOUNTER — Encounter: Payer: Self-pay | Admitting: Certified Registered Nurse Anesthetist

## 2022-03-04 ENCOUNTER — Encounter: Payer: Self-pay | Admitting: Gastroenterology

## 2022-03-04 ENCOUNTER — Ambulatory Visit (AMBULATORY_SURGERY_CENTER): Payer: 59 | Admitting: Gastroenterology

## 2022-03-04 VITALS — BP 104/70 | HR 65 | Temp 97.5°F | Resp 19 | Ht 63.0 in | Wt 145.0 lb

## 2022-03-04 DIAGNOSIS — K2990 Gastroduodenitis, unspecified, without bleeding: Secondary | ICD-10-CM | POA: Diagnosis not present

## 2022-03-04 DIAGNOSIS — D124 Benign neoplasm of descending colon: Secondary | ICD-10-CM

## 2022-03-04 DIAGNOSIS — K259 Gastric ulcer, unspecified as acute or chronic, without hemorrhage or perforation: Secondary | ICD-10-CM | POA: Diagnosis not present

## 2022-03-04 DIAGNOSIS — K2970 Gastritis, unspecified, without bleeding: Secondary | ICD-10-CM | POA: Diagnosis present

## 2022-03-04 DIAGNOSIS — K319 Disease of stomach and duodenum, unspecified: Secondary | ICD-10-CM | POA: Diagnosis not present

## 2022-03-04 DIAGNOSIS — R1013 Epigastric pain: Secondary | ICD-10-CM

## 2022-03-04 MED ORDER — SODIUM CHLORIDE 0.9 % IV SOLN: 500.0000 mL | Freq: Once | INTRAVENOUS | Status: DC

## 2022-03-04 NOTE — Progress Notes (Deleted)
Called to room to assist during endoscopic procedure.  Patient ID and intended procedure confirmed with present staff. Received instructions for my participation in the procedure from the performing physician.  

## 2022-03-04 NOTE — Op Note (Signed)
[facility] ?Patient Name: Maureen [name] ?Procedure Date: 03/04/2022 9:36 AM ?MRN: [id number] ?Endoscopist: Milus Banister , MD ?Age: 56 ?Referring MD:  ?Date of Birth: 1965-12-01 ?Gender: Female ?Account #: [id number] ?Procedure:                Upper GI endoscopy ?Indications:              Epigastric abdominal pain, Dyspepsia ?Medicines:                Monitored Anesthesia Care ?Procedure:                Pre-Anesthesia Assessment: ?                          - Prior to the procedure, a History and Physical  ?                          was performed, and patient medications and  ?                          allergies were reviewed. The patient's tolerance of  ?                          previous anesthesia was also reviewed. The risks  ?                          and benefits of the procedure and the sedation  ?                          options and risks were discussed with the patient.  ?                          All questions were answered, and informed consent  ?                          was obtained. Prior Anticoagulants: The patient has  ?                          taken no previous anticoagulant or antiplatelet  ?                          agents. ASA Grade Assessment: II - A patient with  ?                          mild systemic disease. After reviewing the risks  ?                          and benefits, the patient was deemed in  ?                          satisfactory condition to undergo the procedure. ?                          After obtaining informed consent, the endoscope was  ?  passed under direct vision. Throughout the  ?                          procedure, the patient's blood pressure, pulse, and  ?                          oxygen saturations were monitored continuously. The  ?                          GIF [id number] #[id number] was introduced through the  ?                          mouth, and advanced to the second part of duodenum.  ?                          The upper GI  endoscopy was accomplished without  ?                          difficulty. The patient tolerated the procedure  ?                          well. ?Scope In: ?Scope Out: ?Findings:                 Mild inflammation characterized by erythema was  ?                          found in the gastric antrum. Biopsies were taken  ?                          with a cold forceps for histology. ?                          Several linear erosions in the proximal 1/2 of her  ?                          stomach, non-specific. These were sampled with  ?                          biopsy forceps. ?                          The exam was otherwise without abnormality. ?Complications:            No immediate complications. Estimated blood loss:  ?                          None. ?Estimated Blood Loss:     Estimated blood loss was minimal. ?Impression:               - Mild inflammation characterized by erythema was  ?                          found in the gastric antrum. Biopsies were taken  ?  with a cold forceps for histology. ?                          - Several linear erosions in the proximal 1/2 of  ?                          her stomach, non-specific. These were sampled with  ?                          biopsy forceps. ?                          - The examination was otherwise normal. ?Recommendation:           - Patient has a contact number available for  ?                          emergencies. The signs and symptoms of potential  ?                          delayed complications were discussed with the  ?                          patient. Return to normal activities tomorrow.  ?                          Written discharge instructions were provided to the  ?                          patient. ?                          - Resume previous diet. ?                          - Continue present medications. ?                          - Await pathology results. ?Milus Banister, MD ?03/04/2022 9:54:55 AM ?This report has been  signed electronically. ?

## 2022-03-04 NOTE — Progress Notes (Signed)
0944 Robinul 0.1 mg IV given due large amount of secretions upon assessment.  MD made aware, vss 

## 2022-03-04 NOTE — Progress Notes (Signed)
Called to room to assist during endoscopic procedure.  Patient ID and intended procedure confirmed with present staff. Received instructions for my participation in the procedure from the performing physician.  

## 2022-03-04 NOTE — Progress Notes (Signed)
Report given to PACU, vss 

## 2022-03-04 NOTE — Patient Instructions (Signed)
Handout given for gastritis. ? ?YOU HAD AN ENDOSCOPIC PROCEDURE TODAY AT [facility]:   Refer to the procedure report that was given to you for any specific questions about what was found during the examination.  If the procedure report does not answer your questions, please call your gastroenterologist to clarify.  If you requested that your care partner not be given the details of your procedure findings, then the procedure report has been included in a sealed envelope for you to review at your convenience later. ? ?YOU SHOULD EXPECT: Some feelings of bloating in the abdomen. Passage of more gas than usual.  Walking can help get rid of the air that was put into your GI tract during the procedure and reduce the bloating. If you had a lower endoscopy (such as a colonoscopy or flexible sigmoidoscopy) you may notice spotting of blood in your stool or on the toilet paper. If you underwent a bowel prep for your procedure, you may not have a normal bowel movement for a few days. ? ?Please Note:  You might notice some irritation and congestion in your nose or some drainage.  This is from the oxygen used during your procedure.  There is no need for concern and it should clear up in a day or so. ? ?SYMPTOMS TO REPORT IMMEDIATELY: ? ?Following upper endoscopy (EGD) ? Vomiting of blood or coffee ground material ? New chest pain or pain under the shoulder blades ? Painful or persistently difficult swallowing ? New shortness of breath ? Fever of 100?F or higher ? Black, tarry-looking stools ? ?For urgent or emergent issues, a gastroenterologist can be reached at any hour by calling [phone]. ?Do not use MyChart messaging for urgent concerns.  ? ? ?DIET:  We do recommend a small meal at first, but then you may proceed to your regular diet.  Drink plenty of fluids but you should avoid alcoholic beverages for 24 hours. ? ?ACTIVITY:  You should plan to take it easy for the rest of today and you should NOT  DRIVE or use heavy machinery until tomorrow (because of the sedation medicines used during the test).   ? ?FOLLOW UP: ?Our staff will call the number listed on your records 48-72 hours following your procedure to check on you and address any questions or concerns that you may have regarding the information given to you following your procedure. If we do not reach you, we will leave a message.  We will attempt to reach you two times.  During this call, we will ask if you have developed any symptoms of COVID 19. If you develop any symptoms (ie: fever, flu-like symptoms, shortness of breath, cough etc.) before then, please call [phone].  If you test positive for Covid 19 in the 2 weeks post procedure, please call and report this information to [place].   ? ?If any biopsies were taken you will be contacted by phone or by letter within the next 1-3 weeks.  Please call us at [phone] if you have not heard about the biopsies in 3 weeks.  ? ? ?SIGNATURES/CONFIDENTIALITY: ?You and/or your care partner have signed paperwork which will be entered into your electronic medical record.  These signatures attest to the fact that that the information above on your After Visit Summary has been reviewed and is understood.  Full responsibility of the confidentiality of this discharge information lies with you and/or your care-partner.  ?

## 2022-03-04 NOTE — Progress Notes (Signed)
?  The recent H&P (dated 02/09/2022) was reviewed, the patient was examined and there is no change in the patients condition since that H&P was completed. ? ? ?Maureen [name]  03/04/2022, 9:40 AM ? ?

## 2022-03-08 ENCOUNTER — Telehealth: Payer: Self-pay

## 2022-03-08 NOTE — Telephone Encounter (Signed)
?  Follow up Call- ? ? ?  03/04/2022  ?  9:21 AM  ?Call back number  ?Post procedure Call Back phone  # [phone]  ?Permission to leave phone message Yes  ?  ? ?Patient questions: ? ?Do you have a fever, pain , or abdominal swelling? No. ?Pain Score  0 * ? ?Have you tolerated food without any problems? Yes.   ? ?Have you been able to return to your normal activities? Yes.   ? ?Do you have any questions about your discharge instructions: ?Diet   No. ?Medications  No. ?Follow up visit  No. ? ?Do you have questions or concerns about your Care? No. ? ?Actions: ?* If pain score is 4 or above: ?No action needed, pain <4. ? ? ?

## 2022-03-11 ENCOUNTER — Other Ambulatory Visit: Payer: Self-pay

## 2022-03-11 MED ORDER — OMEPRAZOLE 40 MG PO CPDR: 40.0000 mg | DELAYED_RELEASE_CAPSULE | Freq: Every day | ORAL | 11 refills | Status: DC

## 2022-03-25 ENCOUNTER — Other Ambulatory Visit: Payer: Self-pay | Admitting: Family

## 2022-03-25 ENCOUNTER — Telehealth: Payer: 59 | Admitting: Family

## 2022-03-25 ENCOUNTER — Encounter: Payer: Self-pay | Admitting: Family

## 2022-03-25 DIAGNOSIS — G6289 Other specified polyneuropathies: Secondary | ICD-10-CM | POA: Diagnosis not present

## 2022-03-25 DIAGNOSIS — R609 Edema, unspecified: Secondary | ICD-10-CM | POA: Diagnosis not present

## 2022-03-25 DIAGNOSIS — L659 Nonscarring hair loss, unspecified: Secondary | ICD-10-CM | POA: Diagnosis not present

## 2022-03-25 MED ORDER — HYDROCHLOROTHIAZIDE 12.5 MG PO TABS: 12.5000 mg | ORAL_TABLET | Freq: Every day | ORAL | 3 refills | Status: DC

## 2022-03-25 NOTE — Patient Instructions (Signed)
Peripheral Neuropathy Peripheral neuropathy is a type of nerve damage. It affects nerves that carry signals between the spinal cord and the arms, legs, and the rest of the body (peripheral nerves). It does not affect nerves in the spinal cord or brain. In peripheral neuropathy, one nerve or a group of nerves may be damaged. Peripheral neuropathy is a broad category that includes many specific nerve disorders, like diabetic neuropathy, hereditary neuropathy, and carpal tunnel syndrome. What are the causes? This condition may be caused by: Certain diseases, such as: Diabetes. This is the most common cause of peripheral neuropathy. Autoimmune diseases, such as rheumatoid arthritis and systemic lupus erythematosus. Nerve diseases that are passed from parent to child (inherited). Kidney disease. Thyroid disease. Other causes may include: Nerve injury. Pressure or stress on a nerve that lasts a long time. Lack (deficiency) of B vitamins. This can result from alcoholism, poor diet, or a restricted diet. Infections. Some medicines, such as cancer medicines (chemotherapy). Poisonous (toxic) substances, such as lead and mercury. Too little blood flowing to the legs. In some cases, the cause of this condition is not known. What are the signs or symptoms? Symptoms of this condition depend on which of your nerves is damaged. Symptoms in the legs, hands, and arms can include: Loss of feeling (numbness) in the feet, hands, or both. Tingling in the feet, hands, or both. Burning pain. Very sensitive skin. Weakness. Not being able to move a part of the body (paralysis). Clumsiness or poor coordination. Muscle twitching. Loss of balance. Symptoms in other parts of the body can include: Not being able to control your bladder. Feeling dizzy. Sexual problems. How is this diagnosed? Diagnosing and finding the cause of peripheral neuropathy can be difficult. Your health care provider will take your  medical history and do a physical exam. A neurological exam will also be done. This involves checking things that are affected by your brain, spinal cord, and nerves (nervous system). For example, your health care provider will check your reflexes, how you move, and what you can feel. You may have other tests, such as: Blood tests. Electromyogram (EMG) and nerve conduction tests. These tests check nerve function and how well the nerves are controlling the muscles. Imaging tests, such as a CT scan or MRI, to rule out other causes of your symptoms. Removing a small piece of nerve to be examined in a lab (nerve biopsy). Removing and examining a small amount of the fluid that surrounds the brain and spinal cord (lumbar puncture). How is this treated? Treatment for this condition may involve: Treating the underlying cause of the neuropathy, such as diabetes, kidney disease, or vitamin deficiencies. Stopping medicines that can cause neuropathy, such as chemotherapy. Medicine to help relieve pain. Medicines may include: Prescription or over-the-counter pain medicine. Anti-seizure medicine. Antidepressants. Pain-relieving patches that are applied to painful areas of skin. Surgery to relieve pressure on a nerve or to destroy a nerve that is causing pain. Physical therapy to help improve movement and balance. Devices to help you move around (assistive devices). Follow these instructions at home: Medicines Take over-the-counter and prescription medicines only as told by your health care provider. Do not take any other medicines without first asking your health care provider. Ask your health care provider if the medicine prescribed to you requires you to avoid driving or using machinery. Lifestyle  Do not use any products that contain nicotine or tobacco. These products include cigarettes, chewing tobacco, and vaping devices, such as e-cigarettes. Smoking keeps   blood from reaching damaged nerves. If you  need help quitting, ask your health care provider. Avoid or limit alcohol. Too much alcohol can cause a vitamin B deficiency, and vitamin B is needed for healthy nerves. Eat a healthy diet. This includes: Eating foods that are high in fiber, such as beans, whole grains, and fresh fruits and vegetables. Limiting foods that are high in fat and processed sugars, such as fried or sweet foods. General instructions  If you have diabetes, work closely with your health care provider to keep your blood sugar under control. If you have numbness in your feet: Check every day for signs of injury or infection. Watch for redness, warmth, and swelling. Wear padded socks and comfortable shoes. These help protect your feet. Develop a good support system. Living with peripheral neuropathy can be stressful. Consider talking with a mental health specialist or joining a support group. Use assistive devices and attend physical therapy as told by your health care provider. This may include using a walker or a cane. Keep all follow-up visits. This is important. Where to find more information National Institute of Neurological Disorders: www.ninds.nih.gov Contact a health care provider if: You have new signs or symptoms of peripheral neuropathy. You are struggling emotionally from dealing with peripheral neuropathy. Your pain is not well controlled. Get help right away if: You have an injury or infection that is not healing normally. You develop new weakness in an arm or leg. You have fallen or do so frequently. Summary Peripheral neuropathy is when the nerves in the arms or legs are damaged, resulting in numbness, weakness, or pain. There are many causes of peripheral neuropathy, including diabetes, pinched nerves, vitamin deficiencies, autoimmune disease, and hereditary conditions. Diagnosing and finding the cause of peripheral neuropathy can be difficult. Your health care provider will take your medical  history, do a physical exam, and do tests, including blood tests and nerve function tests. Treatment involves treating the underlying cause of the neuropathy and taking medicines to help control pain. Physical therapy and assistive devices may also help. This information is not intended to replace advice given to you by your health care provider. Make sure you discuss any questions you have with your health care provider. Document Revised: 06/15/2021 Document Reviewed: 06/15/2021 Elsevier Patient Education  2023 [employer]  

## 2022-03-25 NOTE — Progress Notes (Signed)
Virtual Visit Consent   Maureen [name], you are scheduled for a virtual visit with a [facility] provider today. Just as with appointments in the office, your consent must be obtained to participate. Your consent will be active for this visit and any virtual visit you may have with one of our providers in the next 365 days. If you have a MyChart account, a copy of this consent can be sent to you electronically.  As this is a virtual visit, video technology does not allow for your provider to perform a traditional examination. This may limit your provider's ability to fully assess your condition. If your provider identifies any concerns that need to be evaluated in person or the need to arrange testing (such as labs, EKG, etc.), we will make arrangements to do so. Although advances in technology are sophisticated, we cannot ensure that it will always work on either your end or our end. If the connection with a video visit is poor, the visit may have to be switched to a telephone visit. With either a video or telephone visit, we are not always able to ensure that we have a secure connection.  By engaging in this virtual visit, you consent to the provision of healthcare and authorize for your insurance to be billed (if applicable) for the services provided during this visit. Depending on your insurance coverage, you may receive a charge related to this service.  I need to obtain your verbal consent now. Are you willing to proceed with your visit today? LUCELY LEARD has provided verbal consent on 03/25/2022 for a virtual visit (video or telephone). Evelina Dun, FNP  Date: 03/25/2022 3:16 PM  Virtual Visit via Video Note   I, Evelina Dun, connected with  MARKESIA CRILLY  ([id number], 08/05/1966) on 03/25/22 at  2:10 PM EDT by a video-enabled telemedicine application and verified that I am speaking with the correct person using two identifiers.  Location: Patient: Virtual Visit Location Patient:  Home Provider: Virtual Visit Location Provider: Home Office   I discussed the limitations of evaluation and management by telemedicine and the availability of in person appointments. The patient expressed understanding and agreed to proceed.    History of Present Illness: Maureen [name] is a 56 y.o. who identifies as a female who was assigned female at birth, and is being seen today for peripheral edema bilaterally. She takes HCTZ 12.5 mg as needed for the swelling. Requesting refill of this. She has been elevated her legs when possible and massage.   She reports her toenails have not grown since Sept 2022 and has noticed decreased hair growth on her legs.   Reports bilateral intermittent burning aching pain of 5 out 10. Has bought new shoes and tries not to wear shoes.   HPI: HPI  Problems:  Patient Active Problem List   Diagnosis Date Noted   Bipolar 1 disorder, manic, mild ([place]) 12/28/2021   Aortic atherosclerosis ([place]) 02/08/2021   Vision changes 05/28/2020   Paresthesia 05/28/2020   Cervical spondylosis with radiculopathy 07/01/2016   Spondylolisthesis 07/01/2016   Greater trochanteric bursitis of right hip 08/20/2015   Chronic interstitial cystitis 11/25/2014   H/O Spinal surgery 06/24/2014   Fibrositis 01/29/2014   Fibromyalgia 01/29/2014   Lumbosacral radiculitis 10/09/2013   Lumbar post-laminectomy syndrome 09/17/2013   Atypical chest pain 09/02/2013   Other long term (current) drug therapy 05/01/2013   Arthritis 04/16/2013   Abdominal pain, epigastric 03/26/2013   Back pain, chronic 12/05/2012  Connective tissue disease, undifferentiated ([place]) 12/05/2012   Raynaud's syndrome without gangrene 12/05/2012   Systemic lupus erythematosus ([place]) 12/05/2012   Cerebrospinal fluid leak from spinal puncture 08/24/2012   UNSPECIFIED DISORDER OF STOMACH AND DUODENUM 08/26/2008    Allergies:  Allergies  Allergen Reactions   Gabapentin Other (See Comments)    Made paranoia    Amoxicillin Rash   Penicillins Rash    Has patient had a PCN reaction causing immediate rash, facial/tongue/throat swelling, SOB or lightheadedness with hypotension: yes Has patient had a PCN reaction causing severe rash involving mucus membranes or skin necrosis: yes pt did experience rash but it was not severe  Has patient had a PCN reaction that required hospitalization: no Has patient had a PCN reaction occurring within the last 10 years: no If all of the above answers are "NO", then may proceed with Cephalosporin use.    Tegretol [Carbamazepine] Rash   Medications:  Current Outpatient Medications:    Cholecalciferol (VITAMIN D3) 5000 units CAPS, Take 1 tablet by mouth daily., Disp: , Rfl:    estradiol (VIVELLE-DOT) 0.075 MG/24HR, Place 1 patch onto the skin 2 (two) times a week., Disp: 24 patch, Rfl: 3   hydrochlorothiazide (HYDRODIURIL) 12.5 MG tablet, Take 1 tablet (12.5 mg total) by mouth daily., Disp: 90 tablet, Rfl: 3   magnesium oxide (MAG-OX) 400 MG tablet, Take 400 mg by mouth daily., Disp: , Rfl:    Melatonin-Pyridoxine (MELATIN PO), Take 1 tablet by mouth at bedtime as needed., Disp: , Rfl:    omeprazole (PRILOSEC) 40 MG capsule, Take 1 capsule (40 mg total) by mouth daily., Disp: 30 capsule, Rfl: 11   TURMERIC PO, Take 1 tablet by mouth daily., Disp: , Rfl:   Observations/Objective: Patient is well-developed, well-nourished in no acute distress.  Resting comfortably at home.  Head is normocephalic, atraumatic.  No labored breathing. Speech is clear and coherent with logical content.  Patient is alert and oriented at baseline.  No swelling noted at this time.   Assessment and Plan: 1. Peripheral edema - hydrochlorothiazide (HYDRODIURIL) 12.5 MG tablet; Take 1 tablet (12.5 mg total) by mouth daily.  Dispense: 90 tablet; Refill: 3  2. Hair loss  3. Other polyneuropathy  Recommend wearing compression hose  Elevated feet Low salt diet Low salt diet Keep follow up  with Rheumatologists   Follow Up Instructions: I discussed the assessment and treatment plan with the patient. The patient was provided an opportunity to ask questions and all were answered. The patient agreed with the plan and demonstrated an understanding of the instructions.  A copy of instructions were sent to the patient via MyChart unless otherwise noted below.    The patient was advised to call back or seek an in-person evaluation if the symptoms worsen or if the condition fails to improve as anticipated.  Time:  I spent 16 minutes with the patient via telehealth technology discussing the above problems/concerns.    Evelina Dun, FNP

## 2022-03-30 ENCOUNTER — Encounter (HOSPITAL_COMMUNITY): Payer: Self-pay | Admitting: Psychiatry

## 2022-03-30 ENCOUNTER — Telehealth (HOSPITAL_BASED_OUTPATIENT_CLINIC_OR_DEPARTMENT_OTHER): Payer: 59 | Admitting: Psychiatry

## 2022-03-30 VITALS — Wt 145.0 lb

## 2022-03-30 DIAGNOSIS — F902 Attention-deficit hyperactivity disorder, combined type: Secondary | ICD-10-CM | POA: Diagnosis not present

## 2022-03-30 DIAGNOSIS — F3111 Bipolar disorder, current episode manic without psychotic features, mild: Secondary | ICD-10-CM

## 2022-03-30 NOTE — Progress Notes (Signed)
Virtual Visit via Video Note  I connected with Maureen [name] on 03/30/22 at  2:40 PM EDT by a video enabled telemedicine application and verified that I am speaking with the correct person using two identifiers.  Location: Patient: Home Provider: Home Office   I discussed the limitations of evaluation and management by telemedicine and the availability of in person appointments. The patient expressed understanding and agreed to proceed.  History of Present Illness: Patient is evaluated by video session.  She had a cruise trip earlier last month and she started to have some GI symptoms.  She was having stomach pain and cramps.  She admitted picked up the Abilify but never tried.  She reported a lot of anxiety and concerns from the psychotropic medication.  She reported having side effects and that make her very nervous and anxious taking the medication.  She reported after having episode of GI symptoms she got more scared and does not want to take more medication.  Her symptoms are actually much better.  She is trying to give more time to her family.  He recently find out that her mother diagnosed with breast cancer.  She reported her cruise was very good because she was enjoying the family time but got sick and now she is recovering.  She also discussed about her ADHD symptoms but again afraid not to take any medication that may cause worsening of her lupus.  She reported her ADHD symptoms sometimes cause issues not able to move forward in her job but she rather not take any medication because of the side effects.  She sleeps good with the help of melatonin.  She denies any mania, psychosis, hallucination.  She gets emotional but denies any recent impulsive behavior, suicidal thoughts or anger issues.  She denies drinking or using any illegal substances.    Past Psychiatric History: H/O drug addiction, ADHD and bipolar disorder. H/O overdose and inpatient at age 25. H/O brief ER visit few years later  after she threatened to kill herself.  H/O mania, excessive spending, impulsive behavior and depression. Tried lithium, Prozac, Zyprexa, Seroquel, Abilify and Lamictal. Sensitive with higher doses. Developed mania after taking antidepressant.  Saw provider at [facility] at [place].  Psychiatric Specialty Exam: Physical Exam  Review of Systems  Weight 145 lb (65.8 kg).There is no height or weight on file to calculate BMI.  General Appearance: Casual  Eye Contact:  Good  Speech:  Normal Rate  Volume:  Normal  Mood:  Anxious  Affect:  Appropriate  Thought Process:  Descriptions of Associations: Intact  Orientation:  Full (Time, Place, and Person)  Thought Content:  Rumination  Suicidal Thoughts:  No  Homicidal Thoughts:  No  Memory:  Immediate;   Good Recent;   Good Remote;   Fair  Judgement:  Fair  Insight:  Shallow  Psychomotor Activity:  Normal  Concentration:  Concentration: Fair and Attention Span: Fair  Recall:  Good  Fund of Knowledge:  Good  Language:  Good  Akathisia:  No  Handed:  Right  AIMS (if indicated):     Assets:  Communication Skills Desire for Improvement Housing Resilience  ADL's:  Intact  Cognition:  WNL  Sleep:   ok with melatonin      Assessment and Plan: Bipolar disorder type I.  ADHD, combined type.  Patient has not started taking the Abilify and she like to try without medication since she noticed things are  manageable and not worsening.  She is afraid taking the medication may cause worsening of lupus.  I agree with the plan.  We will not schedule any more appointment however she is aware if symptoms started to get worse or if she needed any help in the future then she will call us to schedule appointment.  Discussed safety concerns at any time having active suicidal thoughts or homicidal thought that she need to call 911 or go to local emergency room.  No new medication given on this  visit.  Follow Up Instructions:    I discussed the assessment and treatment plan with the patient. The patient was provided an opportunity to ask questions and all were answered. The patient agreed with the plan and demonstrated an understanding of the instructions.   The patient was advised to call back or seek an in-person evaluation if the symptoms worsen or if the condition fails to improve as anticipated.  Collaboration of Care: Primary Care Provider AEB notes are available in epic to review.  Patient/Guardian was advised Release of Information must be obtained prior to any record release in order to collaborate their care with an outside provider. Patient/Guardian was advised if they have not already done so to contact the registration department to sign all necessary forms in order for [place] to release information regarding their care.   Consent: Patient/Guardian gives verbal consent for treatment and assignment of benefits for services provided during this visit. Patient/Guardian expressed understanding and agreed to proceed.    I provided 15 minutes of non-face-to-face time during this encounter.   [name], MD

## 2022-04-14 ENCOUNTER — Ambulatory Visit: Payer: 59 | Admitting: Physician Assistant

## 2022-04-14 ENCOUNTER — Encounter: Payer: Self-pay | Admitting: Physician Assistant

## 2022-04-14 VITALS — BP 93/64 | HR 68 | Temp 97.7°F | Ht 63.0 in | Wt 147.0 lb

## 2022-04-14 DIAGNOSIS — J029 Acute pharyngitis, unspecified: Secondary | ICD-10-CM | POA: Diagnosis not present

## 2022-04-14 NOTE — Patient Instructions (Signed)

## 2022-04-14 NOTE — Progress Notes (Signed)
  Subjective:     Patient ID: Maureen [name], female   DOB: Sep 24, 1966, 56 y.o.   MRN: [id number]  Sore Throat  Pertinent negatives include no congestion, ear pain or trouble swallowing.   Pt with sl L sided sore throat for 2-3 days Feels like food debris Hx of similar when she had tonsils  Review of Systems  Constitutional: Negative.   HENT:  Positive for sore throat. Negative for congestion, dental problem, ear pain, facial swelling, mouth sores, postnasal drip, rhinorrhea, sinus pain, trouble swallowing and voice change.        Objective:   Physical Exam Vitals and nursing note reviewed.  Constitutional:      General: She is not in acute distress.    Appearance: She is well-developed. She is not ill-appearing or toxic-appearing.  HENT:     Right Ear: Tympanic membrane and ear [name] normal.     Left Ear: Ear [name] normal.     Mouth/Throat:     Mouth: Mucous membranes are moist. No oral lesions.     Pharynx: Uvula midline. No pharyngeal swelling or oropharyngeal exudate.     Comments: + food debris noted to the L posterior tonsil crypt No tonsils Neurological:     Mental Status: She is alert.        Assessment:     1. Pharyngitis, unspecified etiology        Plan:     Salt water gargles Water pick to remove the debris F/U prn PATP

## 2022-05-11 ENCOUNTER — Ambulatory Visit: Payer: 59 | Admitting: Gastroenterology

## 2022-05-11 ENCOUNTER — Encounter: Payer: Self-pay | Admitting: Gastroenterology

## 2022-05-11 VITALS — BP 110/76 | HR 64 | Ht 63.0 in | Wt 146.2 lb

## 2022-05-11 DIAGNOSIS — K219 Gastro-esophageal reflux disease without esophagitis: Secondary | ICD-10-CM

## 2022-05-11 DIAGNOSIS — R1011 Right upper quadrant pain: Secondary | ICD-10-CM

## 2022-05-11 MED ORDER — FAMOTIDINE 20 MG PO TABS: 20.0000 mg | ORAL_TABLET | Freq: Every day | ORAL | 3 refills | Status: DC

## 2022-05-11 NOTE — Progress Notes (Signed)
1.  Unusual surgical finding.  "elevated white plaque seen on the lateral duodenal mesenteric junction" during 2009 lap chole for biliary dyskesia with Dr.Matt Hassell Done.  Followed by 2009 endoscopic ultrasound evaluation by me which was completely normal. 2.  Routine risk for colon cancer: 07/2014 colonoscopy outside system [facility] )for constipation found essentially normal colon, single subcentimeter polyp was biopsied and it was hyperplastic.  Per Dr. At that time recommended repeat colon cancer screening at 10-year interval. 3.  Right upper quadrant pains that improved with proton pump inhibitor, evaluation 2023.  EGD 02/2022 erosive gastritis, pathology showed no sign of infection, serious inflammation or cancer.  Her proton pump inhibitor was increased from 20 mg a day up to 40 mg a day. 4. Remote cholecystectomy.   HPI: This is a very pleasant 56 year old woman   I last saw her at the time of an upper endoscopy about 2 months ago.  See that result summarized above.  Her weight is down 1 pound since her last office visit here about 3 months ago.  She thinks that her proton pump inhibitor omeprazole is causing nausea and constipation.  She cut back the omeprazole from 40 mg to 20 mg once daily and the nausea improved but was still present.  She stopped it completely and the nausea went away.  She is still bothered by intermittent constipation.  She has intermittent pyrosis, no dysphagia, no weight loss.  ROS: complete GI ROS as described in HPI, all other review negative.  Constitutional:  No unintentional weight loss   Past Medical History:  Diagnosis Date   Abdominal pain    Allergy    Anxiety    takes Xanax daily as needed   Arthritis    Cancer (HCC)    MOLE PRE   Cerebrospinal fluid leak from spinal puncture 08/24/2012   Chronic back pain    Constipation    takes Dulcolax and Miralax daily as needed;also Fiber Pills   Depression    takes Lamictal daily   Fibromyalgia     GERD (gastroesophageal reflux disease)    takes Omeprazole daily   Headache(784.0)    Heart murmur    slight   History of bronchitis    last time many yrs ago(73yr ago)   Internal hemorrhoid    Interstitial cystitis    no problems since beginning of Jan 2014   Joint pain    Joint swelling    [place]    takes Plaquenil daily   Muscle spasm    takes Robaxin daily as needed   Numbness and tingling in hands    Osteoarthritis    Pneumonia    hx of > 872yrago   Raynaud's disease    Urinary frequency    Urinary urgency     Past Surgical History:  Procedure Laterality Date   ABDOMINAL HERNIA REPAIR     APPENDECTOMY     CESAREAN SECTION     CHOLECYSTECTOMY     COLONOSCOPY     DIAGNOSTIC LAPAROSCOPY     adhesions   ENDOMETRIAL ABLATION     ESOPHAGOGASTRODUODENOSCOPY N/A 05/08/2013   Procedure: ESOPHAGOGASTRODUODENOSCOPY (EGD);  Surgeon: NaRogene HoustonMD;  [facility];  Service: Endoscopy;  Laterality: N/A;  315   NASAL SEPTUM SURGERY     OVARIAN CYST REMOVAL     SHOULDER SURGERY Right    x 2   SPINAL CORD STIMULATOR INSERTION N/A 06/13/2014   Procedure: LUMBAR SPINAL CORD STIMULATOR INSERTION;  Surgeon:  Bonna Gains, MD;  Location: [facility];  Service: Neurosurgery;  Laterality: N/A;   SPINAL FUSION     x 2    TONSILLECTOMY     TUBAL LIGATION     uterine ablation     VAGINAL HYSTERECTOMY  8/12    Current Outpatient Medications  Medication Instructions   Cholecalciferol (VITAMIN D3) 5000 units CAPS 1 tablet, Oral, Daily   estradiol (VIVELLE-DOT) 0.075 MG/24HR 1 patch, Transdermal, 2 times weekly   hydrochlorothiazide (HYDRODIURIL) 12.5 mg, Oral, Daily   magnesium oxide (MAG-OX) 400 mg, Oral, Daily   Melatonin-Pyridoxine (MELATIN PO) 1 tablet, Oral, At bedtime PRN   omeprazole (PRILOSEC) 40 mg, Oral, Daily   TURMERIC PO 1 tablet, Oral, Daily    Allergies as of 05/11/2022 - Review Complete 05/11/2022  Allergen Reaction Noted   Gabapentin  Other (See Comments) 02/22/2022   Amoxicillin Rash 01/29/2014   Penicillins Rash 04/08/2011   Tegretol [carbamazepine] Rash 11/19/2013    Family History  Problem Relation Age of Onset   Hyperlipidemia Mother    Depression Mother    Anxiety disorder Mother    Colon polyps Father    Ovarian cancer Maternal Aunt    Colon cancer Maternal Uncle    Colon cancer Paternal Aunt    Breast cancer Maternal Grandmother    Alzheimer's disease Maternal Grandmother    Esophageal cancer Neg Hx    Pancreatic cancer Neg Hx    Stomach cancer Neg Hx    Rectal cancer Neg Hx     Social History   Socioeconomic History   Marital status: Legally Separated    Spouse name: Not on file   Number of children: Not on file   Years of education: Not on file   Highest education level: Not on file  Occupational History   Not on file  Tobacco Use   Smoking status: Former    Packs/day: 0.50    Years: 20.00    Total pack years: 10.00    Types: Cigarettes    Quit date: 06/09/2008    Years since quitting: 13.9   Smokeless tobacco: Never  Vaping Use   Vaping Use: Never used  Substance and Sexual Activity   Alcohol use: Yes    Comment: occassionaly   Drug use: No   Sexual activity: Not Currently    Partners: Male    Birth control/protection: Surgical  Other Topics Concern   Not on file  Social History Narrative   Not on file   Social Determinants of Health   Financial Resource Strain: Not on file  Food Insecurity: Not on file  Transportation Needs: Not on file  Physical Activity: Not on file  Stress: Not on file  Social Connections: Not on file  Intimate Partner Violence: Not on file     Physical Exam: Ht '5\' 3"'$  (1.6 m) Comment: height measured without shoes  Wt 146 lb 4 oz (66.3 kg)   BMI 25.91 kg/m  Constitutional: generally well-appearing Psychiatric: alert and oriented x3 Abdomen: soft, nontender, nondistended, no obvious ascites, no peritoneal signs, normal bowel sounds No  peripheral edema noted in lower extremities  Assessment and plan: 56 y.o. female with acid, GERD related dyspepsia  Proton pump inhibitor omeprazole did seem to help a lot of her symptoms however she was bothered by nausea that is a quite common side effect I recommended that she try Pepcid/famotidine 20 mg pills 1 pill at bedtime every night or on a as needed basis.  She is  quite happy with this recommendation and she will return to see me on an as-needed basis.  We had a nice discussion about pathophysiology of acid related symptoms of the GI  Please see the "Patient Instructions" section for addition details about the plan.  Owens Loffler, MD [facility] 05/11/2022, 9:22 AM   Total time on date of encounter was 25 minutes (this included time spent preparing to see the patient reviewing records; obtaining and/or reviewing separately obtained history; performing a medically appropriate exam and/or evaluation; counseling and educating the patient and family if present; ordering medications, tests or procedures if applicable; and documenting clinical information in the health record).

## 2022-05-11 NOTE — Patient Instructions (Signed)
If you are age 55 or younger, your body mass index should be between 19-25. Your Body mass index is 25.91 kg/m. If this is out of the aformentioned range listed, please consider follow up with your Primary Care Provider.  ________________________________________________________  The [facility] providers would like to encourage you to use [facility] to communicate with providers for non-urgent requests or questions.  Due to long hold times on the telephone, sending your provider a message by [facility] may be a faster and more efficient way to get a response.  Please allow 48 business hours for a response.  Please remember that this is for non-urgent requests.  _______________________________________________________  DISCONTINUE: Omeprazole   We have sent the following medications to your pharmacy for you to pick up at your convenience:  START: pepcid '20mg'$  one tablet each night at bedtime or as needed.  You will follow up in our office on an as needed basis.  Thank you for entrusting me with your care and choosing [facility]  Dr Ardis Hughs

## 2022-05-17 ENCOUNTER — Encounter: Payer: Self-pay | Admitting: Family

## 2022-05-17 ENCOUNTER — Ambulatory Visit: Payer: 59 | Admitting: Family

## 2022-05-17 VITALS — BP 122/83 | HR 76 | Ht 63.0 in | Wt 147.8 lb

## 2022-05-17 DIAGNOSIS — R55 Syncope and collapse: Secondary | ICD-10-CM | POA: Diagnosis not present

## 2022-05-17 DIAGNOSIS — I517 Cardiomegaly: Secondary | ICD-10-CM

## 2022-05-17 DIAGNOSIS — I959 Hypotension, unspecified: Secondary | ICD-10-CM | POA: Diagnosis not present

## 2022-05-17 NOTE — Patient Instructions (Signed)
Syncope, Adult  Syncope refers to a condition in which a person temporarily loses consciousness. Syncope may also be called fainting or passing out. It is caused by a sudden decrease in blood flow to the brain. This can happen for a variety of reasons. Most causes of syncope are not dangerous. It can be triggered by things such as needle sticks, seeing blood, pain, or intense emotion. However, syncope can also be a sign of a serious medical problem, such as a heart abnormality. Other causes can include dehydration, migraines, or taking medicines that lower blood pressure. Your health care provider may do tests to find the reason why you are having syncope. If you faint, get medical help right away. Call your local emergency services (911 in the [place] Follow these instructions at home: Pay attention to any changes in your symptoms. Take these actions to stay safe and to help relieve your symptoms: Knowing when you may be about to faint Signs that you may be about to faint include: Feeling dizzy, weak, light-headed, or like the room is spinning. Feeling nauseous. Seeing spots or seeing all white or all black in your field of vision. Having cold, clammy skin or feeling warm and sweaty. Hearing ringing in the ears (tinnitus). If you start to feel like you might faint, sit or lie down right away. If sitting, put your head down between your legs. If lying down, raise (elevate) your feet above the level of your heart. Breathe deeply and steadily. Wait until all the symptoms have passed. Have someone stay with you until you feel stable. Medicines Take over-the-counter and prescription medicines only as told by your health care provider. If you are taking blood pressure or heart medicine, get up slowly and take several minutes to sit and then stand. This can reduce dizziness and decrease the risk of syncope. Lifestyle Do not drive, use machinery, or play sports until your health care provider says it is  okay. Do not drink alcohol. Do not use any products that contain nicotine or tobacco. These products include cigarettes, chewing tobacco, and vaping devices, such as e-cigarettes. If you need help quitting, ask your health care provider. Avoid hot tubs and saunas. General instructions Talk with your health care provider about your symptoms. You may need to have testing to understand the cause of your syncope. Drink enough fluid to keep your urine pale yellow. Avoid prolonged standing. If you must stand for a long time, do movements such as: Moving your legs. Crossing your legs. Flexing and stretching your leg muscles. Squatting. Keep all follow-up visits. This is important. Contact a health care provider if: You have episodes of near fainting. Get help right away if: You faint. You hit your head or are injured after fainting. You have any of these symptoms that may indicate trouble with your heart: Fast or irregular heartbeats (palpitations). Unusual pain in your chest, abdomen, or back. Shortness of breath. You have a seizure. You have a severe headache. You are confused. You have vision problems. You have severe weakness or trouble walking. You are bleeding from your mouth or rectum, or you have black or tarry stool. These symptoms may represent a serious problem that is an emergency. Do not wait to see if your symptoms will go away. Get medical help right away. Call your local emergency services (911 in the [place] Do not drive yourself to the hospital. Summary Syncope refers to a condition in which a person temporarily loses consciousness. Syncope may also be called   fainting or passing out. It is caused by a sudden decrease in blood flow to the brain. Signs that you may be about to faint include dizziness, feeling light-headed, feeling nauseous, sudden vision changes, or cold, clammy skin. Even though most causes of syncope are not dangerous, syncope can be a sign of a serious  medical problem. Get help right away if you faint. If you start to feel like you might faint, sit or lie down right away. If sitting, put your head down between your legs. If lying down, raise (elevate) your feet above the level of your heart. This information is not intended to replace advice given to you by your health care provider. Make sure you discuss any questions you have with your health care provider. Document Revised: 02/18/2021 Document Reviewed: 02/18/2021 Elsevier Patient Education  2023 Elsevier Inc.  

## 2022-05-17 NOTE — Progress Notes (Signed)
Subjective:    Patient ID: Maureen [name], female    DOB: 14-Apr-1966, 56 y.o.   MRN: [id number]  Chief Complaint  Patient presents with   Near Syncope   PT presents to the office today with an episode of syncope. States she was checking out [facility] and started feeling nauseous, dizzy. Then passed out falling backwards on her head, jaw, and back.   She is hypotensive today.   Takes HCTZ 12.5 mg as needed for swelling. States she did take this Friday, but hasn't since.  Loss of Consciousness This is a new problem. The problem occurs intermittently. She lost consciousness for a period of less than 1 minute. The symptoms are aggravated by standing. Associated symptoms include dizziness and weakness. Pertinent negatives include no bladder incontinence, bowel incontinence or slurred speech. She has tried bed rest for the symptoms. The treatment provided mild relief.      Review of Systems  Cardiovascular:  Positive for syncope.  Gastrointestinal:  Negative for bowel incontinence.  Genitourinary:  Negative for bladder incontinence.  Neurological:  Positive for dizziness and weakness.  All other systems reviewed and are negative.      Objective:   Physical Exam Vitals reviewed.  Constitutional:      General: She is not in acute distress.    Appearance: She is well-developed.  HENT:     Head: Normocephalic and atraumatic.     Right Ear: External ear normal.  Eyes:     Pupils: Pupils are equal, round, and reactive to light.  Neck:     Thyroid: No thyromegaly.  Cardiovascular:     Rate and Rhythm: Normal rate and regular rhythm.     Heart sounds: Normal heart sounds. No murmur heard. Pulmonary:     Effort: Pulmonary effort is normal. No respiratory distress.     Breath sounds: Normal breath sounds. No wheezing.  Abdominal:     General: Bowel sounds are normal. There is no distension.     Palpations: Abdomen is soft.     Tenderness: There is no abdominal tenderness.   Musculoskeletal:        General: No tenderness. Normal range of motion.     Cervical back: Normal range of motion and neck supple.  Skin:    General: Skin is warm and dry.  Neurological:     Mental Status: She is alert and oriented to person, place, and time.     Cranial Nerves: No cranial nerve deficit.     Deep Tendon Reflexes: Reflexes are normal and symmetric.  Psychiatric:        Behavior: Behavior normal.        Thought Content: Thought content normal.        Judgment: Judgment normal.      BP 98/68   Pulse 76   Ht $R'5\' 3"'PC$  (1.6 m)   Wt 147 lb 12.8 oz (67 kg)   SpO2 100%   BMI 26.18 kg/m      Assessment & Plan:  SYANN CUPPLES comes in today with chief complaint of Near Syncope   Diagnosis and orders addressed:  1. Syncope, unspecified syncope type - EKG 12-Lead - Anemia Profile B - CMP14+EGFR - TSH - Ambulatory referral to Cardiology  2. Hypotension, unspecified hypotension type  3. Atrial enlargement, left - Ambulatory referral to Cardiology   Labs pending Stop HCTZ  Fall precautions discussed  Health Maintenance reviewed Diet and exercise encouraged  Follow up plan: As needed    [facility]  Lenna Gilford, [place]

## 2022-05-18 LAB — ANEMIA PROFILE B
Basophils Absolute: 0 10*3/uL (ref 0.0–0.2)
Basos: 0 %
EOS (ABSOLUTE): 0 10*3/uL (ref 0.0–0.4)
Eos: 0 %
Ferritin: 150 ng/mL (ref 15–150)
Folate: 7.8 ng/mL (ref >3.0)
Hematocrit: 38.9 % (ref 34.0–46.6)
Hemoglobin: 13.1 g/dL (ref 11.1–15.9)
Immature Grans (Abs): 0 10*3/uL (ref 0.0–0.1)
Immature Granulocytes: 0 %
Iron Saturation: 37 % (ref 15–55)
Iron: 97 ug/dL (ref 27–159)
Lymphocytes Absolute: 1.8 10*3/uL (ref 0.7–3.1)
Lymphs: 28 %
MCH: 32 pg (ref 26.6–33.0)
MCHC: 33.7 g/dL (ref 31.5–35.7)
MCV: 95 fL (ref 79–97)
Monocytes Absolute: 0.5 10*3/uL (ref 0.1–0.9)
Monocytes: 8 %
Neutrophils Absolute: 4.3 10*3/uL (ref 1.4–7.0)
Neutrophils: 64 %
Platelets: 252 10*3/uL (ref 150–450)
RBC: 4.09 x10E6/uL (ref 3.77–5.28)
RDW: 12.5 % (ref 11.7–15.4)
Retic Ct Pct: 1.4 % (ref 0.6–2.6)
Total Iron Binding Capacity: 263 ug/dL (ref 250–450)
UIBC: 166 ug/dL (ref 131–425)
Vitamin B-12: 1091 pg/mL (ref 232–1245)
WBC: 6.7 10*3/uL (ref 3.4–10.8)

## 2022-05-18 LAB — CMP14+EGFR
ALT: 16 IU/L (ref 0–32)
AST: 14 IU/L (ref 0–40)
Albumin/Globulin Ratio: 2 (ref 1.2–2.2)
Albumin: 4.4 g/dL (ref 3.8–4.9)
Alkaline Phosphatase: 52 IU/L (ref 44–121)
BUN/Creatinine Ratio: 18 (ref 9–23)
BUN: 13 mg/dL (ref 6–24)
Bilirubin Total: 0.2 mg/dL (ref 0.0–1.2)
CO2: 28 mmol/L (ref 20–29)
Calcium: 9.4 mg/dL (ref 8.7–10.2)
Chloride: 102 mmol/L (ref 96–106)
Creatinine, Ser: 0.74 mg/dL (ref 0.57–1.00)
Globulin, Total: 2.2 g/dL (ref 1.5–4.5)
Glucose: 85 mg/dL (ref 70–99)
Potassium: 3.9 mmol/L (ref 3.5–5.2)
Sodium: 141 mmol/L (ref 134–144)
Total Protein: 6.6 g/dL (ref 6.0–8.5)
eGFR: 95 mL/min/{1.73_m2} (ref >59)

## 2022-05-18 LAB — TSH: TSH: 1.5 u[IU]/mL (ref 0.450–4.500)

## 2022-06-13 ENCOUNTER — Ambulatory Visit: Payer: 59 | Admitting: Internal Medicine

## 2022-06-13 ENCOUNTER — Encounter: Payer: Self-pay | Admitting: Internal Medicine

## 2022-06-13 VITALS — BP 114/76 | HR 60 | Ht 64.0 in | Wt 145.4 lb

## 2022-06-13 DIAGNOSIS — R55 Syncope and collapse: Secondary | ICD-10-CM

## 2022-06-13 NOTE — Patient Instructions (Signed)
Medication Instructions:  The current medical regimen is effective;  continue present plan and medications.  *If you need a refill on your cardiac medications before your next appointment, please call your pharmacy*   Follow-Up: At [facility], you and your health needs are our priority.  As part of our continuing mission to provide you with exceptional heart care, we have created designated Provider Care Teams.  These Care Teams include your primary Cardiologist (physician) and Advanced Practice Providers (APPs -  Physician Assistants and Nurse Practitioners) who all work together to provide you with the care you need, when you need it.  We recommend signing up for the patient portal called "MyChart".  Sign up information is provided on this After Visit Summary.  MyChart is used to connect with patients for Virtual Visits (Telemedicine).  Patients are able to view lab/test results, encounter notes, upcoming appointments, etc.  Non-urgent messages can be sent to your provider as well.   To learn more about what you can do with MyChart, go to [email or web address].    Your next appointment:   As needed  The format for your next appointment:   In Person  Provider:   Janina Mayo, MD

## 2022-06-13 NOTE — Progress Notes (Signed)
Cardiology Office Note:    Date:  06/13/2022   ID:  Maureen [name], DOB 12-25-1965, MRN [id number]  PCP:  Sharion Balloon, [place] [facility] Cardiologist:  Janina Mayo, MD     Referring MD: Sharion Balloon, FNP   No chief complaint on file. Syncope   History of Present Illness:    Maureen [name] is a 56 y.o. female with a hx below, referral for syncope, prior EKG sinus with 1st degree AV block. [facility] and felt dizzy and faint. She felt nausea. She got tunnel vision. She's had no prior episodes. She notes she walks a lot and can feel some SOB and chest tightness with activity. This has been almost a year. She notes SOB with while talking on the phone. She is unsure if this related to anxiety  Past Medical History:  Diagnosis Date   Abdominal pain    Allergy    Anxiety    takes Xanax daily as needed   Arthritis    Cancer ([place])    MOLE PRE   Cerebrospinal fluid leak from spinal puncture 08/24/2012   Chronic back pain    Constipation    takes Dulcolax and Miralax daily as needed;also Fiber Pills   Depression    takes Lamictal daily   Fibromyalgia    GERD (gastroesophageal reflux disease)    takes Omeprazole daily   Headache(784.0)    Heart murmur    slight   History of bronchitis    last time many yrs ago(22yr ago)   Internal hemorrhoid    Interstitial cystitis    no problems since beginning of Jan 2014   Joint pain    Joint swelling    [place]    takes Plaquenil daily   Muscle spasm    takes Robaxin daily as needed   Numbness and tingling in hands    Osteoarthritis    Pneumonia    hx of > [place]31yrago   Raynaud's disease    Urinary frequency    Urinary urgency     Past Surgical History:  Procedure Laterality Date   ABDOMINAL HERNIA REPAIR     APPENDECTOMY     CESAREAN SECTION     CHOLECYSTECTOMY     COLONOSCOPY     DIAGNOSTIC LAPAROSCOPY     adhesions   ENDOMETRIAL ABLATION      ESOPHAGOGASTRODUODENOSCOPY N/A 05/08/2013   Procedure: ESOPHAGOGASTRODUODENOSCOPY (EGD);  Surgeon: NaRogene HoustonMD;  Location: [facility];  Service: Endoscopy;  Laterality: N/A;  315   NASAL SEPTUM SURGERY     OVARIAN CYST REMOVAL     SHOULDER SURGERY Right    x 2   SPINAL CORD STIMULATOR INSERTION N/A 06/13/2014   Procedure: LUMBAR SPINAL CORD STIMULATOR INSERTION;  Surgeon: PaBonna GainsMD;  Location: [facility];  Service: Neurosurgery;  Laterality: N/A;   SPINAL FUSION     x 2    TONSILLECTOMY     TUBAL LIGATION     uterine ablation     VAGINAL HYSTERECTOMY  8/12    Current Medications: Current Meds  Medication Sig   Cholecalciferol (VITAMIN D3) 5000 units CAPS Take 1 tablet by mouth daily.   estradiol (VIVELLE-DOT) 0.075 MG/24HR Place 1 patch onto the skin 2 (two) times a week.   famotidine (PEPCID) 20 MG tablet Take 1 tablet (20 mg total) by mouth at bedtime. (Patient taking differently: Take 20 mg by  mouth as needed.)   hydrochlorothiazide (HYDRODIURIL) 12.5 MG tablet Take 12.5 mg by mouth as needed.   magnesium oxide (MAG-OX) 400 MG tablet Take 400 mg by mouth daily.   Melatonin-Pyridoxine (MELATIN PO) Take 1 tablet by mouth at bedtime as needed.   TURMERIC PO Take 1 tablet by mouth daily.     Allergies:   Gabapentin, Amoxicillin, Penicillins, and Tegretol [carbamazepine]   Social History   Socioeconomic History   Marital status: Legally Separated    Spouse name: Not on file   Number of children: Not on file   Years of education: Not on file   Highest education level: Not on file  Occupational History   Not on file  Tobacco Use   Smoking status: Former    Packs/day: 0.50    Years: 20.00    Total pack years: 10.00    Types: Cigarettes    Quit date: 06/09/2008    Years since quitting: 14.0   Smokeless tobacco: Never  Vaping Use   Vaping Use: Never used  Substance and Sexual Activity   Alcohol use: Yes    Comment: occassionaly   Drug use: No    Sexual activity: Not Currently    Partners: Male    Birth control/protection: Surgical  Other Topics Concern   Not on file  Social History Narrative   Not on file   Social Determinants of Health   Financial Resource Strain: Not on file  Food Insecurity: Not on file  Transportation Needs: Not on file  Physical Activity: Not on file  Stress: Not on file  Social Connections: Not on file     Family History: The patient's family history includes Alzheimer's disease in her maternal grandmother; Anxiety disorder in her mother; Breast cancer in her maternal grandmother and mother; Colon cancer in her maternal uncle and paternal aunt; Colon polyps in her father; Depression in her mother; Hyperlipidemia in her mother; Ovarian cancer in her maternal aunt. There is no history of Esophageal cancer, Pancreatic cancer, Stomach cancer, or Rectal cancer.  ROS:   Please see the history of present illness.     All other systems reviewed and are negative.  EKGs/Labs/Other Studies Reviewed:    The following studies were reviewed today:   EKG:  EKG is  ordered today.  The ekg ordered today demonstrates NSR  Recent Labs: 05/17/2022: ALT 16; BUN 13; Creatinine, Ser 0.74; Hemoglobin 13.1; Platelets 252; Potassium 3.9; Sodium 141; TSH 1.500   Recent Lipid Panel    Component Value Date/Time   CHOL 207 (H) 01/18/2021 0828   TRIG 63 01/18/2021 0828   HDL 95 01/18/2021 0828   CHOLHDL 2.2 01/18/2021 0828   LDLCALC 101 (H) 01/18/2021 0828     Risk Assessment/Calculations:     Physical Exam:    VS:   Vitals:   06/13/22 1549 06/13/22 1551  BP: 120/76 114/76  Pulse: 60   SpO2: 98%      Wt Readings from Last 3 Encounters:  06/13/22 145 lb 6.4 oz (66 kg)  05/17/22 147 lb 12.8 oz (67 kg)  05/11/22 146 lb 4 oz (66.3 kg)     GEN:  Well nourished, well developed in no acute distress HEENT: Normal NECK: No JVD; No carotid bruits LYMPHATICS: No lymphadenopathy CARDIAC: RRR, no murmurs,  rubs, gallops RESPIRATORY:  Clear to auscultation without rales, wheezing or rhonchi  ABDOMEN: Soft, non-tender, non-distended MUSCULOSKELETAL:  No edema; No deformity  SKIN: Warm and dry NEUROLOGIC:  Alert and oriented x  3 PSYCHIATRIC:  Normal affect   ASSESSMENT:   Vasovagal Syncope:  Symptoms associated with vasovagal syncope with prodrome. We discussed knowing triggers, and lowering to the floor if he notes these triggers, as well as staying hydrated.   CP/SOB: has occurred for up to a year. Notes SOB with talking on the phone. She is low risk for coronary dx. With low risk pretest probability and atypical symptoms, don't recommend further cardiac w/u  PLAN:    In order of problems listed above:  No further cardiac w/u           Medication Adjustments/Labs and Tests Ordered: Current medicines are reviewed at length with the patient today.  Concerns regarding medicines are outlined above.  Orders Placed This Encounter  Procedures   EKG 12-Lead   No orders of the defined types were placed in this encounter.   Patient Instructions  Medication Instructions:  The current medical regimen is effective;  continue present plan and medications.  *If you need a refill on your cardiac medications before your next appointment, please call your pharmacy*   Follow-Up: At [facility], you and your health needs are our priority.  As part of our continuing mission to provide you with exceptional heart care, we have created designated Provider Care Teams.  These Care Teams include your primary Cardiologist (physician) and Advanced Practice Providers (APPs -  Physician Assistants and Nurse Practitioners) who all work together to provide you with the care you need, when you need it.  We recommend signing up for the patient portal called "MyChart".  Sign up information is provided on this After Visit Summary.  MyChart is used to connect with patients for Virtual Visits (Telemedicine).   Patients are able to view lab/test results, encounter notes, upcoming appointments, etc.  Non-urgent messages can be sent to your provider as well.   To learn more about what you can do with MyChart, go to [email or web address].    Your next appointment:   As needed  The format for your next appointment:   In Person  Provider:   Janina Mayo, MD              Signed, Janina Mayo, MD  06/13/2022 4:08 PM    [place]

## 2022-07-02 ENCOUNTER — Other Ambulatory Visit (HOSPITAL_COMMUNITY): Payer: Self-pay | Admitting: Psychiatry

## 2022-07-02 DIAGNOSIS — F902 Attention-deficit hyperactivity disorder, combined type: Secondary | ICD-10-CM

## 2022-07-02 DIAGNOSIS — F3111 Bipolar disorder, current episode manic without psychotic features, mild: Secondary | ICD-10-CM

## 2022-07-05 ENCOUNTER — Other Ambulatory Visit: Payer: Self-pay | Admitting: Family

## 2022-07-05 DIAGNOSIS — Z1231 Encounter for screening mammogram for malignant neoplasm of breast: Secondary | ICD-10-CM

## 2022-07-19 ENCOUNTER — Ambulatory Visit (INDEPENDENT_AMBULATORY_CARE_PROVIDER_SITE_OTHER): Payer: 59

## 2022-07-19 ENCOUNTER — Ambulatory Visit: Payer: 59 | Admitting: Family Medicine

## 2022-07-19 ENCOUNTER — Other Ambulatory Visit: Payer: Self-pay

## 2022-07-19 ENCOUNTER — Encounter: Payer: Self-pay | Admitting: Family Medicine

## 2022-07-19 VITALS — BP 114/69 | HR 67 | Temp 98.2°F | Ht 64.0 in | Wt 146.0 lb

## 2022-07-19 DIAGNOSIS — S6991XA Unspecified injury of right wrist, hand and finger(s), initial encounter: Secondary | ICD-10-CM

## 2022-07-19 DIAGNOSIS — S63642A Sprain of metacarpophalangeal joint of left thumb, initial encounter: Secondary | ICD-10-CM | POA: Diagnosis not present

## 2022-07-19 DIAGNOSIS — G8929 Other chronic pain: Secondary | ICD-10-CM

## 2022-07-19 DIAGNOSIS — S6992XA Unspecified injury of left wrist, hand and finger(s), initial encounter: Secondary | ICD-10-CM

## 2022-07-19 DIAGNOSIS — M79644 Pain in right finger(s): Secondary | ICD-10-CM

## 2022-07-19 DIAGNOSIS — M1991 Primary osteoarthritis, unspecified site: Secondary | ICD-10-CM | POA: Insufficient documentation

## 2022-07-19 MED ORDER — PREDNISONE 20 MG PO TABS: ORAL_TABLET | ORAL | 0 refills | Status: DC

## 2022-07-19 NOTE — Progress Notes (Signed)
Subjective:  Patient ID: Maureen [name], female    DOB: 07/31/1966, 56 y.o.   MRN: [id number]  Patient Care Team: Sharion Balloon, FNP as PCP - General (Family Medicine) Janina Mayo, MD as PCP - Cardiology (Cardiology) Salvadore Dom, MD as Consulting Physician (Obstetrics and Gynecology)   Chief Complaint:  bilateral thumb injury   HPI: Maureen [name] is a 56 y.o. female presenting on 07/19/2022 for bilateral thumb injury   Pt presents today with complaints of bilateral thumb pain, left worse than right. 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 She was recently seen by her rheumatologist in August and received a steroid injection into her left thumb. States this did not help the pain. She is having continued pain and swelling. No loss of function. Hurts with palpation, flexion and extension. No reported injury other than daily use. Aching to sharp in nature, moderate at times.     Relevant past medical, surgical, family, and social history reviewed and updated as indicated.  Allergies and medications reviewed and updated. Data reviewed: Chart in Epic.   Past Medical History:  Diagnosis Date   Abdominal pain    Allergy    Anxiety    takes Xanax daily as needed   Arthritis    Cancer ([place])    MOLE PRE   Cerebrospinal fluid leak from spinal puncture 08/24/2012   Chronic back pain    Constipation    takes Dulcolax and Miralax daily as needed;also Fiber Pills   Depression    takes Lamictal daily   Fibromyalgia    GERD (gastroesophageal reflux disease)    takes Omeprazole daily   Headache(784.0)    Heart murmur    slight   History of bronchitis    last time many yrs ago(60yr ago)   Internal hemorrhoid    Interstitial cystitis    no problems since beginning of Jan 2014   Joint pain    Joint swelling    Lupus ([place]    takes Plaquenil daily   Muscle spasm    takes Robaxin daily as needed   Numbness and tingling in hands    Osteoarthritis     Pneumonia    hx of > 867yrago   Raynaud's disease    Urinary frequency    Urinary urgency     Past Surgical History:  Procedure Laterality Date   ABDOMINAL HERNIA REPAIR     APPENDECTOMY     CESAREAN SECTION     CHOLECYSTECTOMY     COLONOSCOPY     DIAGNOSTIC LAPAROSCOPY     adhesions   ENDOMETRIAL ABLATION     ESOPHAGOGASTRODUODENOSCOPY N/A 05/08/2013   Procedure: ESOPHAGOGASTRODUODENOSCOPY (EGD);  Surgeon: NaRogene HoustonMD;  Location[facility];  Service: Endoscopy;  Laterality: N/A;  315   NASAL SEPTUM SURGERY     OVARIAN CYST REMOVAL     SHOULDER SURGERY Right    x 2   SPINAL CORD STIMULATOR INSERTION N/A 06/13/2014   Procedure: LUMBAR SPINAL CORD STIMULATOR INSERTION;  Surgeon: PaBonna GainsMD;  Location: [facility];  Service: Neurosurgery;  Laterality: N/A;   SPINAL FUSION     x 2    TONSILLECTOMY     TUBAL LIGATION     uterine ablation     VAGINAL HYSTERECTOMY  8/12    Social History   Socioeconomic History   Marital status: Legally Separated    Spouse name: Not on file  Number of children: Not on file   Years of education: Not on file   Highest education level: Not on file  Occupational History   Not on file  Tobacco Use   Smoking status: Former    Packs/day: 0.50    Years: 20.00    Total pack years: 10.00    Types: Cigarettes    Quit date: 06/09/2008    Years since quitting: 14.1   Smokeless tobacco: Never  Vaping Use   Vaping Use: Never used  Substance and Sexual Activity   Alcohol use: Yes    Comment: occassionaly   Drug use: No   Sexual activity: Not Currently    Partners: Male    Birth control/protection: Surgical  Other Topics Concern   Not on file  Social History Narrative   Not on file   Social Determinants of Health   Financial Resource Strain: Not on file  Food Insecurity: Not on file  Transportation Needs: Not on file  Physical Activity: Not on file  Stress: Not on file  Social Connections: Not on file  Intimate  Partner Violence: Not on file    Outpatient Encounter Medications as of 07/19/2022  Medication Sig   Cholecalciferol (VITAMIN D3) 5000 units CAPS Take 1 tablet by mouth daily.   estradiol (VIVELLE-DOT) 0.075 MG/24HR Place 1 patch onto the skin 2 (two) times a week.   famotidine (PEPCID) 20 MG tablet Take 1 tablet (20 mg total) by mouth at bedtime. (Patient taking differently: Take 20 mg by mouth as needed.)   hydrochlorothiazide (HYDRODIURIL) 12.5 MG tablet Take 12.5 mg by mouth as needed.   magnesium oxide (MAG-OX) 400 MG tablet Take 400 mg by mouth daily.   Melatonin-Pyridoxine (MELATIN PO) Take 1 tablet by mouth at bedtime as needed.   predniSONE (DELTASONE) 20 MG tablet 2 po at sametime daily for 5 days- start tomorrow   TURMERIC PO Take 1 tablet by mouth daily.   No facility-administered encounter medications on file as of 07/19/2022.    Allergies  Allergen Reactions   Gabapentin Other (See Comments)    Made paranoia   Amoxicillin Rash   Penicillins Rash    Has patient had a PCN reaction causing immediate rash, facial/tongue/throat swelling, SOB or lightheadedness with hypotension: yes Has patient had a PCN reaction causing severe rash involving mucus membranes or skin necrosis: yes pt did experience rash but it was not severe  Has patient had a PCN reaction that required hospitalization: no Has patient had a PCN reaction occurring within the last 10 years: no If all of the above answers are "NO", then may proceed with Cephalosporin use.    Tegretol [Carbamazepine] Rash    Review of Systems  Constitutional:  Negative for activity change, appetite change, chills, fatigue and fever.  HENT: Negative.    Eyes: Negative.   Respiratory:  Negative for cough, chest tightness and shortness of breath.   Cardiovascular:  Negative for chest pain, palpitations and leg swelling.  Gastrointestinal:  Negative for abdominal pain, blood in stool, constipation, diarrhea, nausea and vomiting.   Endocrine: Negative.   Genitourinary:  Negative for dysuria, frequency and urgency.  Musculoskeletal:  Positive for arthralgias, joint swelling and myalgias.  Skin: Negative.   Allergic/Immunologic: Negative.   Neurological:  Negative for dizziness and headaches.  Hematological: Negative.   Psychiatric/Behavioral:  Negative for confusion, hallucinations, sleep disturbance and suicidal ideas.   All other systems reviewed and are negative.       Objective:  BP 114/69  Pulse 67   Temp 98.2 F (36.8 C)   Ht _0  (1.626 m)   Wt 146 lb (66.2 kg)   PF 99 L/min   BMI 25.06 kg/m    Wt Readings from Last 3 Encounters:  07/19/22 146 lb (66.2 kg)  06/13/22 145 lb 6.4 oz (66 kg)  05/17/22 147 lb 12.8 oz (67 kg)    Physical Exam Vitals and nursing note reviewed.  Constitutional:      General: She is not in acute distress.    Appearance: Normal appearance. She is well-developed and well-groomed. She is not ill-appearing, toxic-appearing or diaphoretic.  HENT:     Head: Normocephalic and atraumatic.     Jaw: There is normal jaw occlusion.     Right Ear: Hearing normal.     Left Ear: Hearing normal.     Nose: Nose normal.     Mouth/Throat:     Lips: Pink.     Mouth: Mucous membranes are moist.     Pharynx: Uvula midline.  Eyes:     General: Lids are normal.     Pupils: Pupils are equal, round, and reactive to light.  Neck:     Thyroid: No thyroid mass, thyromegaly or thyroid tenderness.     Vascular: No carotid bruit or JVD.     Trachea: Trachea and phonation normal.  Cardiovascular:     Rate and Rhythm: Normal rate and regular rhythm.     Chest Wall: PMI is not displaced.     Pulses: Normal pulses.     Heart sounds: Normal heart sounds. No murmur heard.    No friction rub. No gallop.  Pulmonary:     Effort: Pulmonary effort is normal.     Breath sounds: Normal breath sounds.  Abdominal:     General: There is no abdominal bruit.     Palpations: There is no  hepatomegaly or splenomegaly.  Musculoskeletal:     Right wrist: Normal.     Left wrist: Normal.     Right hand: Tenderness present. No swelling, deformity, lacerations or bony tenderness. Normal range of motion. Normal strength. Normal sensation. There is no disruption of two-point discrimination. Normal capillary refill. Normal pulse.     Left hand: Swelling and tenderness present. No deformity, lacerations or bony tenderness. Decreased range of motion. Normal strength. Normal sensation. There is no disruption of two-point discrimination. Normal capillary refill. Normal pulse.       Arms:     Cervical back: Normal range of motion and neck supple.     Right lower leg: No edema.     Left lower leg: No edema.  Lymphadenopathy:     Cervical: No cervical adenopathy.  Skin:    General: Skin is warm and dry.     Capillary Refill: Capillary refill takes less than 2 seconds.     Coloration: Skin is not cyanotic, jaundiced or pale.     Findings: No rash.  Neurological:     General: No focal deficit present.     Mental Status: She is alert and oriented to person, place, and time.     Sensory: Sensation is intact.     Motor: Motor function is intact.     Coordination: Coordination is intact.     Gait: Gait is intact.     Deep Tendon Reflexes: Reflexes are normal and symmetric.  Psychiatric:        Attention and Perception: Attention and perception normal.  Mood and Affect: Mood and affect normal.        Speech: Speech normal.        Behavior: Behavior normal. Behavior is cooperative.        Thought Content: Thought content normal.        Cognition and Memory: Cognition and memory normal.        Judgment: Judgment normal.     Results for orders placed or performed in visit on 05/17/22  Anemia Profile B  Result Value Ref Range   Total Iron Binding Capacity 263 250 - 450 ug/dL   UIBC 166 131 - 425 ug/dL   Iron 97 27 - 159 ug/dL   Iron Saturation 37 15 - 55 %   Ferritin 150 15 -  150 ng/mL   Vitamin B-12 1,091 232 - 1,245 pg/mL   Folate 7.8 >3.0 ng/mL   WBC 6.7 3.4 - 10.8 x10E3/uL   RBC 4.09 3.77 - 5.28 x10E6/uL   Hemoglobin 13.1 11.1 - 15.9 g/dL   Hematocrit 38.9 34.0 - 46.6 %   MCV 95 79 - 97 fL   MCH 32.0 26.6 - 33.0 pg   MCHC 33.7 31.5 - 35.7 g/dL   RDW 12.5 11.7 - 15.4 %   Platelets 252 150 - 450 x10E3/uL   Neutrophils 64 Not Estab. %   Lymphs 28 Not Estab. %   Monocytes 8 Not Estab. %   Eos 0 Not Estab. %   Basos 0 Not Estab. %   Neutrophils Absolute 4.3 1.4 - 7.0 x10E3/uL   Lymphocytes Absolute 1.8 0.7 - 3.1 x10E3/uL   Monocytes Absolute 0.5 0.1 - 0.9 x10E3/uL   EOS (ABSOLUTE) 0.0 0.0 - 0.4 x10E3/uL   Basophils Absolute 0.0 0.0 - 0.2 x10E3/uL   Immature Granulocytes 0 Not Estab. %   Immature Grans (Abs) 0.0 0.0 - 0.1 x10E3/uL   Retic Ct Pct 1.4 0.6 - 2.6 %  CMP14+EGFR  Result Value Ref Range   Glucose 85 70 - 99 mg/dL   BUN 13 6 - 24 mg/dL   Creatinine, Ser 0.74 0.57 - 1.00 mg/dL   eGFR 95 >59 mL/min/1.73   BUN/Creatinine Ratio 18 9 - 23   Sodium 141 134 - 144 mmol/L   Potassium 3.9 3.5 - 5.2 mmol/L   Chloride 102 96 - 106 mmol/L   CO2 28 20 - 29 mmol/L   Calcium 9.4 8.7 - 10.2 mg/dL   Total Protein 6.6 6.0 - 8.5 g/dL   Albumin 4.4 3.8 - 4.9 g/dL   Globulin, Total 2.2 1.5 - 4.5 g/dL   Albumin/Globulin Ratio 2.0 1.2 - 2.2   Bilirubin Total 0.2 0.0 - 1.2 mg/dL   Alkaline Phosphatase 52 44 - 121 IU/L   AST 14 0 - 40 IU/L   ALT 16 0 - 32 IU/L  TSH  Result Value Ref Range   TSH 1.500 0.450 - 4.500 uIU/mL     X-Ray: right thumb: arthritic changes, no acute findings. Preliminary x-ray reading by Monia Pouch, FNP-C, [facility]. X-Ray: left thumb: soft tissue swelling, arthritic changes, no acute findings. Preliminary x-ray reading by Monia Pouch, FNP-C, [facility].   Pertinent labs & imaging results that were available during my care of the patient were reviewed by me and considered in my medical decision making.  Assessment & Plan:  Tahliyah  was seen today for bilateral thumb injury.  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  -     DG Finger Thumb Left; Future -     DG Finger Thumb Right; Future  Sprain of metacarpophalangeal (MCP) joint of left thumb, initial encounter Thumb spica splint applied in office. Will burst with steroids. Symptomatic care discussed in detail. Pt aware to follow up in 4 weeks, sooner if warranted. If not improving, will refer to ortho and obtain MRI. -     predniSONE (DELTASONE) 20 MG tablet; 2 po at sametime daily for 5 days- start tomorrow  Chronic pain of right thumb Symptomatic care discussed in detail.     Continue all other maintenance medications.  Follow up plan: Return in about 4 weeks (around 08/16/2022), or if symptoms worsen or fail to improve.   Continue healthy lifestyle choices, including diet (rich in fruits, vegetables, and lean proteins, and low in salt and simple carbohydrates) and exercise (at least 30 minutes of moderate physical activity daily).  Educational handout given for thumb sprain  The above assessment and management plan was discussed with the patient. The patient verbalized understanding of and has agreed to the management plan. Patient is aware to call the clinic if they develop any new symptoms or if symptoms persist or worsen. Patient is aware when to return to the clinic for a follow-up visit. Patient educated on when it is appropriate to go to the emergency department.   Monia Pouch, FNP-C [facility] [phone]

## 2022-07-27 ENCOUNTER — Ambulatory Visit: Payer: 59

## 2022-07-27 ENCOUNTER — Telehealth: Payer: Self-pay | Admitting: Family

## 2022-07-27 NOTE — Telephone Encounter (Signed)
Please go ahead and place referral

## 2022-07-27 NOTE — Addendum Note (Signed)
Addended by: Baruch Gouty on: 07/27/2022 03:31 PM   Modules accepted: Orders

## 2022-07-28 NOTE — Telephone Encounter (Signed)
Patient aware.

## 2022-08-02 ENCOUNTER — Encounter: Payer: Self-pay | Admitting: Sports Medicine

## 2022-08-02 ENCOUNTER — Ambulatory Visit (INDEPENDENT_AMBULATORY_CARE_PROVIDER_SITE_OTHER): Payer: 59 | Admitting: Sports Medicine

## 2022-08-02 VITALS — Ht 63.0 in | Wt 142.0 lb

## 2022-08-02 DIAGNOSIS — M79644 Pain in right finger(s): Secondary | ICD-10-CM

## 2022-08-02 DIAGNOSIS — M329 Systemic lupus erythematosus, unspecified: Secondary | ICD-10-CM | POA: Diagnosis not present

## 2022-08-02 DIAGNOSIS — M79645 Pain in left finger(s): Secondary | ICD-10-CM

## 2022-08-02 DIAGNOSIS — M180 Bilateral primary osteoarthritis of first carpometacarpal joints: Secondary | ICD-10-CM | POA: Diagnosis not present

## 2022-08-02 MED ORDER — DICLOFENAC SODIUM 1 % EX GEL: 4.0000 g | Freq: Four times a day (QID) | CUTANEOUS | 0 refills | Status: DC

## 2022-08-02 NOTE — Progress Notes (Signed)
Pain in bilateral thumbs; started in July Complaining of swelling in it.  August 10th; injection in left by Rheum. 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

## 2022-08-02 NOTE — Patient Instructions (Signed)
Maureen [name], it was great to meet you today, thank you for letting me participate in your care.  Today, we discussed your bilateral thumb pain.  You do have arthritic changes at the [facility] joint of the thumbs.  I think this was flared up from your lupus and is still being aggravated today.  Things we will do for this: -Ice for 20 minutes when painful or swollen -Use topical Voltaren gel 3 times a day over the painful area, rub this on -Use your cool comfort thumb braces with activity.  Avoid heavy gripping activity and limit use of the thumb as much as possible over the next few weeks until this calms down -You may follow-up with my occupational therapist, Nate, who can make you custom braces and/or give you activities to help with your pain  You will follow-up with me in about 4 to 6 weeks.  If you have any further questions, please give the clinic a call [phone].  Elba Barman, DO Primary Care Sports Medicine Physician  [place]

## 2022-08-02 NOTE — Progress Notes (Signed)
Maureen [name] - 56 y.o. female MRN [id number]  Date of birth: 01-08-1966  Office Visit Note: Visit Date: 08/02/2022 PCP: Sharion Balloon, FNP Referred by: Baruch Gouty, FNP  Subjective: Chief Complaint  Patient presents with   Left Hand - Pain   Right Hand - Pain   HPI: Maureen [name] is a pleasant 56 y.o. female who presents today for bilateral thumb pain.  Has a history of bilateral thumb pain.  She does have a history of lupus, she is managed by rheumatology by this.  She did have an injection into the left [facility] joint on August 10, did receive partial benefit from this.  She does get swelling within the joint itself, prednisone has helped to some extent.  Recently completed a prednisone burst for 5 days by her PCP about 2-3 weeks ago--did not get as much of the pain relief that she was hoping for.  She does have a brace for the left hand, but does not wear it consistently as it causes her more pain to wear--sounds like she was given a cock-up wrist brace.  She works on a Teaching laboratory technician daily.  Does report swelling in the thumbs.  Initial pain started back in July -at this time she was doing a lot of deep tissue massage for her boyfriend shoulders, feels like this may have exacerbated it.  Pertinent ROS were reviewed with the patient and found to be negative unless otherwise specified above in HPI.   Assessment & Plan: Visit Diagnoses:  1. Osteoarthritis of carpometacarpal (CMC) joints of both thumbs, unspecified osteoarthritis type   2. Lupus arthritis ([place])   3. Bilateral thumb pain    Plan: Discussed all treatment options with the knees.  It is too close to repeat [facility] joint injection for the left thumb, which is most bothersome to her.  We did allow her to try some cool comfort thumb braces today, although she wanted to check if this was covered by her insurance.  Did recommend ice when swollen, did give her information for topical Voltaren gel that she may apply 3 times a day.  Given  her gastritis and previous ulcers, will avoid NSAIDs at this time.  Discussed activity modification.  I would like to send her to our occupational therapist, Nate, to see if he has any rehab/exercises to help with her pain and/or possibly custom splints to help with her computer work less pain.  We will see how she does with these modalities and follow-up in the next 4 to 6 weeks.  We can always consider repeat CMC joint injection at that time.  I would like her to see her rheumatologist to work on controlling her generalized lupus arthritis pains as I think this is the main driving factor, but obviously exacerbated given her recent deep tissue massage activity.  Follow-up: Return in about 5 years (around 08/03/2027) for follow-up 4-6 weeks .   Meds & Orders:  Meds ordered this encounter  Medications   diclofenac Sodium (VOLTAREN) 1 % GEL    Sig: Apply 4 g topically 4 (four) times daily.    Dispense:  4 g    Refill:  0    Orders Placed This Encounter  Procedures   Ambulatory referral to Occupational Therapy     Procedures: No procedures performed      Clinical History: No specialty comments available.  She reports that she quit smoking about 14 years ago. Her smoking use included cigarettes. She has a 10.00  pack-year smoking history. She has never used smokeless tobacco. No results for input(s): "HGBA1C", "LABURIC" in the last 8760 hours.  Objective:   Vital Signs: Ht '5\' 3"'$  (1.6 m)   Wt 142 lb (64.4 kg)   BMI 25.15 kg/m   Physical Exam  Gen: Well-appearing, in no acute distress; non-toxic CV: Regular Rate. Well-perfused. Warm.  Resp: Breathing unlabored on room air; no wheezing. Psych: Fluid speech in conversation; appropriate affect; normal thought process Neuro: Sensation intact throughout. No gross coordination deficits.   Ortho Exam - Left thumb: + TTP at the [facility] joint.  Pain with CMC grind test.  There is a mild degree of subluxation radially at the [facility] joint at 60 degrees  of flexion.  Okay sign intact.  Flexion extension at all joints of the thumb intact.  There is mild swelling at the [facility] joint.  Negative Tinel's, negative Finkelstein's, and Phalen's test.  - Right thumb: Mild positive TTP at the [facility] joint.  Mild pain with CMC grind test.  RCL and LCL ligaments appear intact without chronic subluxation at the [facility] joint.  Full range of motion of the thumb, no swelling or effusion.  Negative Tinel's, negative Finkelstein's and Phalen's test.  Imaging:  *Independent review of complete thumb x-rays of the right and left from 07/19/2022 were independently reviewed by myself.  X-rays show right CMC mild DJD, left thumb at least moderate CMC DJD.  No acute fracture noted.  DG Finger Thumb Left CLINICAL DATA:  Left thumb pain  EXAM: LEFT THUMB 2+V  COMPARISON:  Radiographs 12/14/2012  FINDINGS: No acute fracture or dislocation. Mild degenerative arthritis at the first [facility] joint. Soft tissues are unremarkable.  IMPRESSION: No acute fracture or dislocation.  Electronically Signed   By: Placido Sou M.D.   On: 07/19/2022 22:55 DG Finger Thumb Right CLINICAL DATA:  Right thumb injury  EXAM: RIGHT THUMB 2+V  COMPARISON:  None Available.  FINDINGS: There is no evidence of fracture or dislocation. Mild degenerative arthritis at the first [facility] joint. Soft tissues are unremarkable.  IMPRESSION: No acute fracture or dislocation.  Electronically Signed   By: Placido Sou M.D.   On: 07/19/2022 22:54    Past Medical/Family/Surgical/Social History: Medications & Allergies reviewed per EMR, new medications updated. Patient Active Problem List   Diagnosis Date Noted   Primary osteoarthritis 07/19/2022   Bipolar 1 disorder, manic, mild ([place]) 12/28/2021   Aortic atherosclerosis ([place]) 02/08/2021   Vision changes 05/28/2020   Paresthesia 05/28/2020   Cervical spondylosis with radiculopathy 07/01/2016   Spondylolisthesis 07/01/2016   Greater  trochanteric bursitis of right hip 08/20/2015   Chronic interstitial cystitis 11/25/2014   H/O Spinal surgery 06/24/2014   Fibrositis 01/29/2014   Fibromyalgia 01/29/2014   Lumbosacral radiculitis 10/09/2013   Lumbar post-laminectomy syndrome 09/17/2013   Atypical chest pain 09/02/2013   Other long term (current) drug therapy 05/01/2013   Arthritis 04/16/2013   Abdominal pain, epigastric 03/26/2013   Back pain, chronic 12/05/2012   Connective tissue disease, undifferentiated ([place]) 12/05/2012   Raynaud's syndrome without gangrene 12/05/2012   Systemic lupus erythematosus ([place]) 12/05/2012   Cerebrospinal fluid leak from spinal puncture 08/24/2012   UNSPECIFIED DISORDER OF STOMACH AND DUODENUM 08/26/2008   Past Medical History:  Diagnosis Date   Abdominal pain    Allergy    Anxiety    takes Xanax daily as needed   Arthritis    Cancer ([place])    MOLE PRE   Cerebrospinal fluid leak from spinal puncture 08/24/2012  Chronic back pain    Constipation    takes Dulcolax and Miralax daily as needed;also Fiber Pills   Depression    takes Lamictal daily   Fibromyalgia    GERD (gastroesophageal reflux disease)    takes Omeprazole daily   Headache(784.0)    Heart murmur    slight   History of bronchitis    last time many yrs ago(84yr ago)   Internal hemorrhoid    Interstitial cystitis    no problems since beginning of Jan 2014   Joint pain    Joint swelling    Lupus ([facility])    takes Plaquenil daily   Muscle spasm    takes Robaxin daily as needed   Numbness and tingling in hands    Osteoarthritis    Pneumonia    hx of > 855yrago   Raynaud's disease    Urinary frequency    Urinary urgency    Family History  Problem Relation Age of Onset   Hyperlipidemia Mother    Depression Mother    Anxiety disorder Mother    Breast cancer Mother    Colon polyps Father    Breast cancer Maternal Grandmother    Alzheimer's disease Maternal Grandmother    Ovarian cancer Maternal Aunt     Colon cancer Maternal Uncle    Colon cancer Paternal Aunt    Esophageal cancer Neg Hx    Pancreatic cancer Neg Hx    Stomach cancer Neg Hx    Rectal cancer Neg Hx    Past Surgical History:  Procedure Laterality Date   ABDOMINAL HERNIA REPAIR     APPENDECTOMY     CESAREAN SECTION     CHOLECYSTECTOMY     COLONOSCOPY     DIAGNOSTIC LAPAROSCOPY     adhesions   ENDOMETRIAL ABLATION     ESOPHAGOGASTRODUODENOSCOPY N/A 05/08/2013   Procedure: ESOPHAGOGASTRODUODENOSCOPY (EGD);  Surgeon: NaRogene HoustonMD;  [facility];  Service: Endoscopy;  Laterality: N/A;  315   NASAL SEPTUM SURGERY     OVARIAN CYST REMOVAL     SHOULDER SURGERY Right    x 2   SPINAL CORD STIMULATOR INSERTION N/A 06/13/2014   Procedure: LUMBAR SPINAL CORD STIMULATOR INSERTION;  Surgeon: PaBonna GainsMD;  Location: [facility];  Service: Neurosurgery;  Laterality: N/A;   SPINAL FUSION     x 2    TONSILLECTOMY     TUBAL LIGATION     uterine ablation     VAGINAL HYSTERECTOMY  8/12   Social History   Occupational History   Not on file  Tobacco Use   Smoking status: Former    Packs/day: 0.50    Years: 20.00    Total pack years: 10.00    Types: Cigarettes    Quit date: 06/09/2008    Years since quitting: 14.1   Smokeless tobacco: Never  Vaping Use   Vaping Use: Never used  Substance and Sexual Activity   Alcohol use: Yes    Comment: occassionaly   Drug use: No   Sexual activity: Not Currently    Partners: Male    Birth control/protection: Surgical

## 2022-08-04 ENCOUNTER — Other Ambulatory Visit: Payer: Self-pay

## 2022-08-04 ENCOUNTER — Ambulatory Visit (INDEPENDENT_AMBULATORY_CARE_PROVIDER_SITE_OTHER): Payer: 59 | Admitting: Rehabilitative and Restorative Service Providers"

## 2022-08-04 ENCOUNTER — Telehealth: Payer: Self-pay | Admitting: *Deleted

## 2022-08-04 DIAGNOSIS — M25541 Pain in joints of right hand: Secondary | ICD-10-CM

## 2022-08-04 DIAGNOSIS — M25542 Pain in joints of left hand: Secondary | ICD-10-CM

## 2022-08-04 DIAGNOSIS — M25632 Stiffness of left wrist, not elsewhere classified: Secondary | ICD-10-CM

## 2022-08-04 DIAGNOSIS — M25532 Pain in left wrist: Secondary | ICD-10-CM

## 2022-08-04 DIAGNOSIS — M25631 Stiffness of right wrist, not elsewhere classified: Secondary | ICD-10-CM

## 2022-08-04 MED ORDER — ESTRADIOL 10 MCG VA TABS: 1.0000 | ORAL_TABLET | VAGINAL | 1 refills | Status: DC

## 2022-08-04 NOTE — Therapy (Signed)
OUTPATIENT OCCUPATIONAL THERAPY ORTHO EVALUATION  Patient Name: Maureen [name] MRN: [id number] DOB:April 05, 1966, 56 y.o., female Today's Date: 08/04/2022  PCP: Evelina Dun, FNP REFERRING PROVIDER: Elba Barman, DO   OT End of Session - 08/04/22 [phone]     Visit Number 1    Number of Visits 8    Date for OT Re-Evaluation 09/16/22    Authorization Type U[employer]    OT Start Time 0812    OT Stop Time 0908    OT Time Calculation (min) 56 min    Activity Tolerance Patient tolerated treatment well;Patient limited by pain    Behavior During Therapy [facility] for tasks assessed/performed             Past Medical History:  Diagnosis Date   Abdominal pain    Allergy    Anxiety    takes Xanax daily as needed   Arthritis    Cancer ([place])    MOLE PRE   Cerebrospinal fluid leak from spinal puncture 08/24/2012   Chronic back pain    Constipation    takes Dulcolax and Miralax daily as needed;also Fiber Pills   Depression    takes Lamictal daily   Fibromyalgia    GERD (gastroesophageal reflux disease)    takes Omeprazole daily   Headache(784.0)    Heart murmur    slight   History of bronchitis    last time many yrs ago(29yr ago)   Internal hemorrhoid    Interstitial cystitis    no problems since beginning of Jan 2014   Joint pain    Joint swelling    Lupus ([place]    takes Plaquenil daily   Muscle spasm    takes Robaxin daily as needed   Numbness and tingling in hands    Osteoarthritis    Pneumonia    hx of > 864yrago   Raynaud's disease    Urinary frequency    Urinary urgency    Past Surgical History:  Procedure Laterality Date   ABDOMINAL HERNIA REPAIR     APPENDECTOMY     CESAREAN SECTION     CHOLECYSTECTOMY     COLONOSCOPY     DIAGNOSTIC LAPAROSCOPY     adhesions   ENDOMETRIAL ABLATION     ESOPHAGOGASTRODUODENOSCOPY N/A 05/08/2013   Procedure: ESOPHAGOGASTRODUODENOSCOPY (EGD);  Surgeon: NaRogene HoustonMD;  [facility];  Service: Endoscopy;  Laterality:  N/A;  315   NASAL SEPTUM SURGERY     OVARIAN CYST REMOVAL     SHOULDER SURGERY Right    x 2   SPINAL CORD STIMULATOR INSERTION N/A 06/13/2014   Procedure: LUMBAR SPINAL CORD STIMULATOR INSERTION;  Surgeon: PaBonna GainsMD;  Location: [facility]UR[facility];  Service: Neurosurgery;  Laterality: N/A;   SPINAL FUSION     x 2    TONSILLECTOMY     TUBAL LIGATION     uterine ablation     VAGINAL HYSTERECTOMY  8/12   Patient Active Problem List   Diagnosis Date Noted   Primary osteoarthritis 07/19/2022   Bipolar 1 disorder, manic, mild ([place]/04/2022   Aortic atherosclerosis ([place]/18/2022   Vision changes 05/28/2020   Paresthesia 05/28/2020   Cervical spondylosis with radiculopathy 07/01/2016   Spondylolisthesis 07/01/2016   Greater trochanteric bursitis of right hip 08/20/2015   Chronic interstitial cystitis 11/25/2014   H/O Spinal surgery 06/24/2014   Fibrositis 01/29/2014   Fibromyalgia 01/29/2014   Lumbosacral radiculitis 10/09/2013   Lumbar post-laminectomy syndrome 09/17/2013   Atypical  chest pain 09/02/2013   Other long term (current) drug therapy 05/01/2013   Arthritis 04/16/2013   Abdominal pain, epigastric 03/26/2013   Back pain, chronic 12/05/2012   Connective tissue disease, undifferentiated ([place]) 12/05/2012   Raynaud's syndrome without gangrene 12/05/2012   Systemic lupus erythematosus ([place]) 12/05/2012   Cerebrospinal fluid leak from spinal puncture 08/24/2012   UNSPECIFIED DISORDER OF STOMACH AND DUODENUM 08/26/2008    ONSET DATE: July 2023  REFERRING DIAG: M18.0 (ICD-10-CM) - Osteoarthritis of carpometacarpal (CMC) joints of both thumbs, unspecified osteoarthritis type  THERAPY DIAG:  Pain in joint of left hand  Pain in joint of right hand  Stiffness of right wrist, not elsewhere classified  Pain in left wrist  Stiffness of left wrist, not elsewhere classified  Rationale for Evaluation and Treatment Rehabilitation  SUBJECTIVE:   SUBJECTIVE  STATEMENT: She uses computer/typing for work. She states having chronic pain and "pins and rods" in her back. She states over working thumbs giving SO a massage. She does have [place] comfy cool braces on order, was previously using wrist thumb spica. She has lupus. She states difficulties with I/ADLs.    PERTINENT HISTORY: Per MD: "*bilateral CMC joint OA, L > R *rehab/therapy, also may benefit from custom bracing? Appreciate recs." She had Lt thumb CMC J injection 06/02/22 which helped pain. Prednisone also helps somewhat. Has a brace but doesn't wear consistently per records.   PRECAUTIONS: None  WEIGHT BEARING RESTRICTIONS No  PAIN:  Are you having pain? Yes Rating: 2/10 at rest, up to 5/10 in past week at worst  FALLS: Has patient fallen in last 6 months? No  LIVING ENVIRONMENT: Lives with: lives alone   PLOF: Independent  PATIENT GOALS To have less pain with use of hands    OBJECTIVE: (All objective assessments below are from initial evaluation on: 08/04/22 unless otherwise specified.)    HAND DOMINANCE: Right   ADLs: Overall ADLs: States decreased ability to grab, hold household objects, pain and inability to open containers, perform FMS tasks (manipulate fasteners on clothing), mild to moderate bathing problems as well.    FUNCTIONAL OUTCOME MEASURES: Eval: Patient Specific Functional Scale: 5 (driving, typing, using cell phone)  (Higher Score  =  Better Ability for the Selected Tasks)      UPPER EXTREMITY ROM     Shoulder to Wrist AROM Right eval Left eval  Shoulder flexion    Shoulder abduction    Shoulder extension    Shoulder internal rotation    Shoulder external rotation    Elbow flexion    Elbow extension    Forearm supination    Forearm pronation     Wrist flexion 58 50  Wrist extension 67 58 pain  Wrist ulnar deviation    Wrist radial deviation    Functional dart thrower's motion (F-DTM) in ulnar flexion    F-DTM in radial extension     (Blank  rows = not tested)   Hand AROM Right eval Left eval  Full Fist Ability (or Gap to Distal Palmar Crease)    Thumb Opposition to Small Finger (or Gap)    Thumb Opposition to Base of Small Finger (or Gap)     Thumb MCP (0-60) 54 60  Thumb IP (0-80)    Thumb Radial abd/add (0-55) 54 54   Thumb Palmar abd/add (0-45) 64 42   (Blank rows = not tested)   UPPER EXTREMITY MMT:    Eval:  NT at eval due to high pain levels.  MMT Right TBD Left TBD  Wrist flexion    Wrist extension    Wrist ulnar deviation    Wrist radial deviation    (Blank rows = not tested)  HAND FUNCTION: Eval: Observed weakness in affected hands.  Grip strength Right: 39 lbs, Left: 45.6 lbs   COORDINATION: Eval: Observed coordination impairments with affected hands L > R. 9 Hole Peg Test Right: TBDsec, Left: TBD sec   SENSATION: Eval:  Light touch intact today, no /co numbness  EDEMA:   Eval: Mildly swollen in b/l hands (thumbs and MCP Js of digits 2-3) and Lt wrist today  OBSERVATIONS:   Eval: swollen over 1st dorsal wrist compartment but neg for Finkelstein's test, positive grind b/l CMC Js and TTP L > R. MCP Js of digits 2-3 also very swollen   TODAY'S TREATMENT:  Post-evaluation treatment:  She is edu on task modification for massages for SO including using tools (IASTM, vibration tools, therapy cane). Edu to prevent repetitive gripping, overly strong grip, prolonged use of hands if able during daily tasks (self-care recommendations).  Also given initial HEP which OT did with her and then she demo's back (as below).  These things did not hurt hurt after cues and several trials, and opposition stretch actually felt "good."   Exercises - Seated Wrist Flexion Stretch  - 2-3 x daily - 3 reps - 15 hold - Wrist Extension Stretch Pronated  - 2-3 x daily - 3 reps - 15 hold - Stretch thumb downward   - 2-3 x daily - 3-5 reps - 15 sec hold - Stretch Thumb into "C-Shape" (don't use other thumb)  - 2-3 x daily  - 3-5 reps - 15 sec hold  Last, she was edu for when/how to use pre-fab comfy cools, and hopefully they work well.  They typically only support the [place] J, and she may need custom support at MCP Js as well, I pain stays acute/high.   PATIENT EDUCATION: Education details: See tx section above for details  Person educated: Patient Education method: Verbal Instruction, Teach back, Handouts  Education comprehension: States and demonstrates understanding, Additional Education required    HOME EXERCISE PROGRAM: Access Code: [email or web address] URL: [email or web address] Date: 08/04/2022 Prepared by: Benito Mccreedy   GOALS: Goals reviewed with patient? Yes   SHORT TERM GOALS: (STG required if POC>30 days) Target Date: 08/19/22  Pt will obtain protective, custom orthotic. Goal status: TBD/PRN  2.  Pt will demo/state understanding of initial HEP to improve pain levels and prerequisite motion. Goal status: INITIAL   LONG TERM GOALS: Target Date: 09/16/22  Pt will improve functional ability by decreased impairment per PSFS assessment from 5 to 8 or better, for better quality of life. Goal status: INITIAL  2.  Pt will improve grip strength in b/l hands by at least 5 lbs each with no added pain for functional use at home and in IADLs. Goal status: INITIAL  3.  Pt will improve A/ROM in b/l wrists flex and ext to at least 65* for all (or better), to have functional motion for tasks like reach and grasp.  Goal status: INITIAL  4.  Pt will improve coordination skills in b/l hands, as seen by better score on 9HPT testing to WNL without pain to have increased functional ability to carry out fine motor tasks (fasteners, etc.) and more complex, coordinated IADLs (meal prep, sports, etc.). (Baseline TBD in next session)  Goal status: INITIAL  6.  Pt will decrease pain at worst from  5/10 to 2/10 or better to have better sleep and occupational participation in daily roles. Goal status:  INITIAL   ASSESSMENT:  CLINICAL IMPRESSION: Patient is a 56 y.o. female who was seen today for occupational therapy evaluation for b/l thumb pain, stiffness, and decreased function. She will benefit from OP OT to increase coping and quality of life.   PERFORMANCE DEFICITS in functional skills including ADLs, IADLs, coordination, dexterity, strength, pain, fascial restrictions, flexibility, FMC, body mechanics, endurance, and UE functional use, and psychosocial skills including coping strategies, environmental adaptation, and habits.   IMPAIRMENTS are limiting patient from ADLs, IADLs, work, and leisure.   COMORBIDITIES has co-morbidities such as depression, fibromyalgia, OA, Raynaud's, chronic back pain and others  that affects occupational performance. Patient will benefit from skilled OT to address above impairments and improve overall function.  MODIFICATION OR ASSISTANCE TO COMPLETE EVALUATION: No modification of tasks or assist necessary to complete an evaluation.  OT OCCUPATIONAL PROFILE AND HISTORY: Problem focused assessment: Including review of records relating to presenting problem.  CLINICAL DECISION MAKING: LOW - limited treatment options, no task modification necessary  REHAB POTENTIAL: Excellent  EVALUATION COMPLEXITY: Low      PLAN: OT FREQUENCY: 1x/week or every other week if preferred  OT DURATION: 6 weeks (through 09/16/22)   PLANNED INTERVENTIONS: self care/ADL training, therapeutic exercise, therapeutic activity, neuromuscular re-education, manual therapy, splinting, fluidotherapy, compression bandaging, moist heat, cryotherapy, contrast bath, patient/family education, and coping strategies training  RECOMMENDED OTHER SERVICES: none now   CONSULTED AND AGREED WITH PLAN OF CARE: Patient  PLAN FOR NEXT SESSION: Check initial HEP light stretches to wrists and thumbs, add strengthening portion. Check use of comfy cool braces and consider custom orthotics if they  cannot control pain well with only CMC J support.    Benito Mccreedy, OTR/L, CHT 08/04/2022, 9:23 AM

## 2022-08-04 NOTE — Telephone Encounter (Signed)
Please let the patient know that I have sent her script for vaginal estrogen to her pharmacy.

## 2022-08-04 NOTE — Telephone Encounter (Signed)
Patient informed. 

## 2022-08-04 NOTE — Telephone Encounter (Signed)
Patient called requesting to start back on estradiol vaginal 10 mcg vag tablets. Patient reports you told her if she would like to start back on Rx to call. Last annual exam was 12/2021,mammogram scheduled 08/15/2022. Please advise

## 2022-08-15 ENCOUNTER — Ambulatory Visit: Payer: 59 | Admitting: Family

## 2022-08-16 ENCOUNTER — Ambulatory Visit: Payer: 59 | Admitting: Family Medicine

## 2022-08-18 ENCOUNTER — Encounter: Payer: 59 | Admitting: Rehabilitative and Restorative Service Providers"

## 2022-08-19 ENCOUNTER — Ambulatory Visit
Admission: RE | Admit: 2022-08-19 | Discharge: 2022-08-19 | Disposition: A | Discharge status: Home / Self Care | Payer: 59 | Source: Ambulatory Visit | Attending: Family | Admitting: Family

## 2022-08-19 DIAGNOSIS — Z1231 Encounter for screening mammogram for malignant neoplasm of breast: Secondary | ICD-10-CM

## 2022-08-23 ENCOUNTER — Encounter: Payer: Self-pay | Admitting: Family Medicine

## 2022-08-23 ENCOUNTER — Ambulatory Visit: Payer: 59 | Admitting: Family Medicine

## 2022-08-23 DIAGNOSIS — N898 Other specified noninflammatory disorders of vagina: Secondary | ICD-10-CM | POA: Diagnosis not present

## 2022-08-23 DIAGNOSIS — J069 Acute upper respiratory infection, unspecified: Secondary | ICD-10-CM

## 2022-08-23 LAB — WET PREP FOR TRICH, YEAST, CLUE
Clue Cell Exam: NEGATIVE
Trichomonas Exam: NEGATIVE
Yeast Exam: NEGATIVE

## 2022-08-23 MED ORDER — DOXYCYCLINE HYCLATE 100 MG PO TABS: 100.0000 mg | ORAL_TABLET | Freq: Two times a day (BID) | ORAL | 0 refills | Status: AC

## 2022-08-23 NOTE — Progress Notes (Signed)
Virtual Visit via telephone Note Due to COVID-19 pandemic this visit was conducted virtually. This visit type was conducted due to national recommendations for restrictions regarding the COVID-19 Pandemic (e.g. social distancing, sheltering in place) in an effort to limit this patient's exposure and mitigate transmission in our community. All issues noted in this document were discussed and addressed.  A physical exam was not performed with this format.   I connected with Maureen [name] on 08/23/2022 at 0930 by telephone and verified that I am speaking with the correct person using two identifiers. Maureen [name] is currently located at home and family is currently with them during visit. The provider, Monia Pouch, FNP is located in their office at time of visit.  I discussed the limitations, risks, security and privacy concerns of performing an evaluation and management service by virtual visit and the availability of in person appointments. I also discussed with the patient that there may be a patient responsible charge related to this service. The patient expressed understanding and agreed to proceed.  Subjective:  Patient ID: Maureen [name], female    DOB: September 27, 1966, 56 y.o.   MRN: [id number]  Chief Complaint:  URI   HPI: Maureen [name] is a 56 y.o. female presenting on 08/23/2022 for URI   Pt reports URI symptoms for over 2 weeks. COVID negative at home several times. Has had low grade fevers. Also reports vaginal burning, concerned she has BV as she has had in the past. Denies discharge or bleeding. No urinary symptoms.   URI  This is a recurrent problem. Episode onset: 2.5 weeks ago. The maximum temperature recorded prior to her arrival was 100.4 - 100.9 F. The fever has been present for 1 to 2 days. Associated symptoms include congestion, headaches, a plugged ear sensation, rhinorrhea and a sore throat. Pertinent negatives include no abdominal pain, chest pain, coughing, diarrhea,  dysuria, ear pain, joint pain, joint swelling, nausea, neck pain, rash, sinus pain, sneezing, swollen glands, vomiting or wheezing. She has tried acetaminophen and decongestant for the symptoms. The treatment provided mild relief.     Relevant past medical, surgical, family, and social history reviewed and updated as indicated.  Allergies and medications reviewed and updated.   Past Medical History:  Diagnosis Date   Abdominal pain    Allergy    Anxiety    takes Xanax daily as needed   Arthritis    Cancer ([facility])    MOLE PRE   Cerebrospinal fluid leak from spinal puncture 08/24/2012   Chronic back pain    Constipation    takes Dulcolax and Miralax daily as needed;also Fiber Pills   Depression    takes Lamictal daily   Fibromyalgia    GERD (gastroesophageal reflux disease)    takes Omeprazole daily   Headache(784.0)    Heart murmur    slight   History of bronchitis    last time many yrs ago(9yr ago)   Internal hemorrhoid    Interstitial cystitis    no problems since beginning of Jan 2014   Joint pain    Joint swelling    Lupus ([place]    takes Plaquenil daily   Muscle spasm    takes Robaxin daily as needed   Numbness and tingling in hands    Osteoarthritis    Pneumonia    hx of > 847yrago   Raynaud's disease    Urinary frequency    Urinary urgency     Past Surgical History:  Procedure Laterality Date   ABDOMINAL HERNIA REPAIR     APPENDECTOMY     CESAREAN SECTION     CHOLECYSTECTOMY     COLONOSCOPY     DIAGNOSTIC LAPAROSCOPY     adhesions   ENDOMETRIAL ABLATION     ESOPHAGOGASTRODUODENOSCOPY N/A 05/08/2013   Procedure: ESOPHAGOGASTRODUODENOSCOPY (EGD);  Surgeon: Rogene Houston, MD;  Location[facility];  Service: Endoscopy;  Laterality: N/A;  315   NASAL SEPTUM SURGERY     OVARIAN CYST REMOVAL     SHOULDER SURGERY Right    x 2   SPINAL CORD STIMULATOR INSERTION N/A 06/13/2014   Procedure: LUMBAR SPINAL CORD STIMULATOR INSERTION;  Surgeon: Bonna Gains, MD;  Location: [facility];  Service: Neurosurgery;  Laterality: N/A;   SPINAL FUSION     x 2    TONSILLECTOMY     TUBAL LIGATION     uterine ablation     VAGINAL HYSTERECTOMY  8/12    Social History   Socioeconomic History   Marital status: Legally Separated    Spouse name: Not on file   Number of children: Not on file   Years of education: Not on file   Highest education level: Not on file  Occupational History   Not on file  Tobacco Use   Smoking status: Former    Packs/day: 0.50    Years: 20.00    Total pack years: 10.00    Types: Cigarettes    Quit date: 06/09/2008    Years since quitting: 14.2   Smokeless tobacco: Never  Vaping Use   Vaping Use: Never used  Substance and Sexual Activity   Alcohol use: Yes    Comment: occassionaly   Drug use: No   Sexual activity: Not Currently    Partners: Male    Birth control/protection: Surgical  Other Topics Concern   Not on file  Social History Narrative   Not on file   Social Determinants of Health   Financial Resource Strain: Not on file  Food Insecurity: Not on file  Transportation Needs: Not on file  Physical Activity: Not on file  Stress: Not on file  Social Connections: Not on file  Intimate Partner Violence: Not on file    Outpatient Encounter Medications as of 08/23/2022  Medication Sig   doxycycline (VIBRA-TABS) 100 MG tablet Take 1 tablet (100 mg total) by mouth 2 (two) times daily for 10 days. 1 po bid   Cholecalciferol (VITAMIN D3) 5000 units CAPS Take 1 tablet by mouth daily.   diclofenac Sodium (VOLTAREN) 1 % GEL Apply 4 g topically 4 (four) times daily.   estradiol (VIVELLE-DOT) 0.075 MG/24HR Place 1 patch onto the skin 2 (two) times a week.   Estradiol 10 MCG TABS vaginal tablet Place 1 tablet (10 mcg total) vaginally 2 (two) times a week.   famotidine (PEPCID) 20 MG tablet Take 1 tablet (20 mg total) by mouth at bedtime. (Patient taking differently: Take 20 mg by mouth as needed.)    hydrochlorothiazide (HYDRODIURIL) 12.5 MG tablet Take 12.5 mg by mouth as needed.   magnesium oxide (MAG-OX) 400 MG tablet Take 400 mg by mouth daily.   Melatonin-Pyridoxine (MELATIN PO) Take 1 tablet by mouth at bedtime as needed.   predniSONE (DELTASONE) 20 MG tablet 2 po at sametime daily for 5 days- start tomorrow   TURMERIC PO Take 1 tablet by mouth daily.   No facility-administered encounter medications on file as of 08/23/2022.    Allergies  Allergen Reactions   Gabapentin Other (See Comments)    Made paranoia   Amoxicillin Rash   Penicillins Rash    Has patient had a PCN reaction causing immediate rash, facial/tongue/throat swelling, SOB or lightheadedness with hypotension: yes Has patient had a PCN reaction causing severe rash involving mucus membranes or skin necrosis: yes pt did experience rash but it was not severe  Has patient had a PCN reaction that required hospitalization: no Has patient had a PCN reaction occurring within the last 10 years: no If all of the above answers are "NO", then may proceed with Cephalosporin use.    Tegretol [Carbamazepine] Rash    Review of Systems  Constitutional:  Negative for activity change, appetite change, chills, diaphoresis, fatigue, fever and unexpected weight change.  HENT:  Positive for congestion, postnasal drip, rhinorrhea and sore throat. Negative for dental problem, drooling, ear discharge, ear pain, facial swelling, hearing loss, mouth sores, nosebleeds, sinus pressure, sinus pain, sneezing, tinnitus and trouble swallowing.   Eyes: Negative.  Negative for photophobia and visual disturbance.  Respiratory:  Negative for cough, chest tightness, shortness of breath and wheezing.   Cardiovascular:  Negative for chest pain, palpitations and leg swelling.  Gastrointestinal:  Negative for abdominal pain, blood in stool, constipation, diarrhea, nausea and vomiting.  Endocrine: Negative.   Genitourinary:  Negative for decreased urine  volume, difficulty urinating, dyspareunia, dysuria, enuresis, flank pain, frequency, genital sores, hematuria, menstrual problem, pelvic pain, urgency, vaginal bleeding, vaginal discharge and vaginal pain.       Vaginal burning  Musculoskeletal:  Negative for arthralgias, joint pain, myalgias and neck pain.  Skin: Negative.  Negative for rash.  Allergic/Immunologic: Negative.   Neurological:  Positive for headaches. Negative for dizziness, tremors, seizures, syncope, facial asymmetry, speech difficulty, weakness, light-headedness and numbness.  Hematological: Negative.   Psychiatric/Behavioral:  Negative for confusion, hallucinations, sleep disturbance and suicidal ideas.   All other systems reviewed and are negative.        Observations/Objective: No vital signs or physical exam, this was a virtual health encounter.  Pt alert and oriented, answers all questions appropriately, and able to speak in full sentences.    Assessment and Plan: Diagnoses and all orders for this visit:  URI with cough and congestion Ongoing for 18 days. Will start below as prescribed. Symptomatic care discussed in detail. Report new, worsening, or persistent symptoms.  -     doxycycline (VIBRA-TABS) 100 MG tablet; Take 1 tablet (100 mg total) by mouth 2 (two) times daily for 10 days. 1 po bid  Vaginal irritation Wet prep negative for BV, trich, or yeast.  -     WET PREP FOR TRICH, YEAST, CLUE     Follow Up Instructions: Return if symptoms worsen or fail to improve.    I discussed the assessment and treatment plan with the patient. The patient was provided an opportunity to ask questions and all were answered. The patient agreed with the plan and demonstrated an understanding of the instructions.   The patient was advised to call back or seek an in-person evaluation if the symptoms worsen or if the condition fails to improve as anticipated.  The above assessment and management plan was discussed with  the patient. The patient verbalized understanding of and has agreed to the management plan. Patient is aware to call the clinic if they develop any new symptoms or if symptoms persist or worsen. Patient is aware when to return to the clinic for a follow-up visit. Patient educated  on when it is appropriate to go to the emergency department.    I provided 15 minutes of time during this telephone encounter.   Monia Pouch, FNP-C [facility] [address] [place], [place] [address] [phone] 08/23/2022

## 2022-09-22 ENCOUNTER — Encounter: Payer: Self-pay | Admitting: Sports Medicine

## 2022-09-22 ENCOUNTER — Ambulatory Visit: Payer: 59 | Admitting: Sports Medicine

## 2022-09-22 DIAGNOSIS — G8929 Other chronic pain: Secondary | ICD-10-CM | POA: Diagnosis not present

## 2022-09-22 DIAGNOSIS — M79645 Pain in left finger(s): Secondary | ICD-10-CM

## 2022-09-22 DIAGNOSIS — M329 Systemic lupus erythematosus, unspecified: Secondary | ICD-10-CM

## 2022-09-22 DIAGNOSIS — M180 Bilateral primary osteoarthritis of first carpometacarpal joints: Secondary | ICD-10-CM

## 2022-09-22 NOTE — Progress Notes (Signed)
Maureen [name] - 56 y.o. female MRN [id number]  Date of birth: May 11, 1966  Office Visit Note: Visit Date: 09/22/2022 PCP: Maureen Balloon, FNP Referred by: Maureen Balloon, FNP  Subjective: Chief Complaint  Patient presents with   Right Hand - Pain   Left Hand - Pain   HPI: Maureen [name] is a pleasant 56 y.o. female who presents today for bilateral thumb pain, left worse than right.  Last saw patient on 08/02/2022.  She does have a history of lupus and is managed by rheumatology but is not on any DMARDs or maintenance medication for this 2/2 to side effects, photosensitivity.  She has had previous x-rays of the thumbs which show mild to moderate CMC joint arthritis on the left, mild on the right.  She had a CMC joint injection of the left thumb on August 10 which did get partial benefit from this.  She does note intermittent swelling of this joint as well as all of her finger joints.  After her last visit she did see our occupational therapist, Maureen [name] who did provide her with a Comfort Cool brace and CMC support.  She has been wearing these, but finds the benefit and consistent.  She knows she should not be taking NSAIDs given her previous ulcers, but this is the only thing that calms down the pain to some extent.  She is concerned there may be something additional causing her pain compared to her lupus arthritis.  Pertinent ROS were reviewed with the patient and found to be negative unless otherwise specified above in HPI.   Assessment & Plan: Visit Diagnoses:  1. Osteoarthritis of carpometacarpal (CMC) joints of both thumbs, unspecified osteoarthritis type   2. Lupus arthritis (Maureen [name])   3. Chronic pain of left thumb    Plan: Discussed all treatment options with Maureen [name].  Her pain seems to stem from the [place] [name] joint of the thumb, although she does have some laxity with UCL testing.  No known injury.  At this point given the lack of improvement with conservative measures, we will proceed  with MRI of the wrist/thumb to evaluate the joint, surrounding tendon structures and UCL.  She will follow-up after this to discuss neck steps.  Those findings, we will treat as needed.  I did discuss with her she may consider seeing a hand surgeon well, she is agreeable to this plan.  Follow-up: Return for follow-up with Dr. Rolena [name] 3 business days after obtaining MRI .   Meds & Orders: No orders of the defined types were placed in this encounter.   Orders Placed This Encounter  Procedures   MR Wrist Left w/o contrast     Procedures: No procedures performed      Clinical History: No specialty comments available.  She reports that she quit smoking about 14 years ago. Her smoking use included cigarettes. She has a 10.00 pack-year smoking history. She has never used smokeless tobacco. No results for input(s): "HGBA1C", "LABURIC" in the last 8760 hours.  Objective:   Vital Signs: There were no vitals taken for this visit.  Physical Exam  Gen: Well-appearing, in no acute distress; non-toxic CV: Regular Rate. Well-perfused. Warm.  Resp: Breathing unlabored on room air; no wheezing. Psych: Fluid speech in conversation; appropriate affect; normal thought process Neuro: Sensation intact throughout. No gross coordination deficits.   Ortho Exam - Left thumb: There is tenderness to palpation at the Maureen [name] joint, quite exquisite pain with CMC grind test.  There is a  mild-moderate degree of subluxation radially with UCL testing.  Okay sign intact.  Very mild pain with Finkelstein's testing.  Negative Tinel's at the wrist.  She does have some mild joint swelling of the MCPs of all 5 digits.  Imaging: No results found.  Past Medical/Family/Surgical/Social History: Medications & Allergies reviewed per EMR, new medications updated. Patient Active Problem List   Diagnosis Date Noted   Primary osteoarthritis 07/19/2022   Bipolar 1 disorder, manic, mild ([place]) 12/28/2021   Aortic atherosclerosis  ([place]) 02/08/2021   Vision changes 05/28/2020   Paresthesia 05/28/2020   Cervical spondylosis with radiculopathy 07/01/2016   Spondylolisthesis 07/01/2016   Greater trochanteric bursitis of right hip 08/20/2015   Chronic interstitial cystitis 11/25/2014   H/O Spinal surgery 06/24/2014   Fibrositis 01/29/2014   Fibromyalgia 01/29/2014   Lumbosacral radiculitis 10/09/2013   Lumbar post-laminectomy syndrome 09/17/2013   Atypical chest pain 09/02/2013   Other long term (current) drug therapy 05/01/2013   Arthritis 04/16/2013   Abdominal pain, epigastric 03/26/2013   Back pain, chronic 12/05/2012   Connective tissue disease, undifferentiated ([place]) 12/05/2012   Raynaud's syndrome without gangrene 12/05/2012   Systemic lupus erythematosus ([place]) 12/05/2012   Cerebrospinal fluid leak from spinal puncture 08/24/2012   UNSPECIFIED DISORDER OF STOMACH AND DUODENUM 08/26/2008   Past Medical History:  Diagnosis Date   Abdominal pain    Allergy    Anxiety    takes Xanax daily as needed   Arthritis    Cancer ([place])    MOLE PRE   Cerebrospinal fluid leak from spinal puncture 08/24/2012   Chronic back pain    Constipation    takes Dulcolax and Miralax daily as needed;also Fiber Pills   Depression    takes Lamictal daily   Fibromyalgia    GERD (gastroesophageal reflux disease)    takes Omeprazole daily   Headache(784.0)    Heart murmur    slight   History of bronchitis    last time many yrs ago(53yr ago)   Internal hemorrhoid    Interstitial cystitis    no problems since beginning of Jan 2014   Joint pain    Joint swelling    [place]upus [place]    takes Plaquenil daily   Muscle spasm    takes Robaxin daily as needed   Numbness and tingling in hands    Osteoarthritis    Pneumonia    hx of > 868yrago   Raynaud's disease    Urinary frequency    Urinary urgency    Family History  Problem Relation Age of Onset   Hyperlipidemia Mother    Depression Mother    Anxiety disorder Mother     Breast cancer Mother    Colon polyps Father    Breast cancer Maternal Grandmother    Alzheimer's disease Maternal Grandmother    Ovarian cancer Maternal Aunt    Colon cancer Maternal Uncle    Colon cancer Paternal Aunt    Esophageal cancer Neg Hx    Pancreatic cancer Neg Hx    Stomach cancer Neg Hx    Rectal cancer Neg Hx    Past Surgical History:  Procedure Laterality Date   ABDOMINAL HERNIA REPAIR     APPENDECTOMY     CESAREAN SECTION     CHOLECYSTECTOMY     COLONOSCOPY     DIAGNOSTIC LAPAROSCOPY     adhesions   ENDOMETRIAL ABLATION     ESOPHAGOGASTRODUODENOSCOPY N/A 05/08/2013   Procedure: ESOPHAGOGASTRODUODENOSCOPY (EGD);  Surgeon: NaRogene HoustonMD;  [facility];  Service: Endoscopy;  Laterality: N/A;  315   NASAL SEPTUM SURGERY     OVARIAN CYST REMOVAL     SHOULDER SURGERY Right    x 2   SPINAL CORD STIMULATOR INSERTION N/A 06/13/2014   Procedure: LUMBAR SPINAL CORD STIMULATOR INSERTION;  Surgeon: Bonna Gains, MD;  [facility];  Service: Neurosurgery;  Laterality: N/A;   SPINAL FUSION     x 2    TONSILLECTOMY     TUBAL LIGATION     uterine ablation     VAGINAL HYSTERECTOMY  8/12   Social History   Occupational History   Not on file  Tobacco Use   Smoking status: Former    Packs/day: 0.50    Years: 20.00    Total pack years: 10.00    Types: Cigarettes    Quit date: 06/09/2008    Years since quitting: 14.2   Smokeless tobacco: Never  Vaping Use   Vaping Use: Never used  Substance and Sexual Activity   Alcohol use: Yes    Comment: occassionaly   Drug use: No   Sexual activity: Not Currently    Partners: Male    Birth control/protection: Surgical

## 2022-09-22 NOTE — Progress Notes (Signed)
Not doing much better; the left is the worse  Did get some braces to wear; she has since stopped wearing them  She is wanting an MRI of the left side   Taking aleve 2x/day for the pain

## 2022-10-04 ENCOUNTER — Other Ambulatory Visit: Payer: Self-pay

## 2022-10-04 ENCOUNTER — Telehealth: Payer: Self-pay

## 2022-10-04 ENCOUNTER — Ambulatory Visit
Admission: RE | Admit: 2022-10-04 | Discharge: 2022-10-04 | Disposition: A | Discharge status: Home / Self Care | Payer: 59 | Source: Ambulatory Visit | Attending: Sports Medicine | Admitting: Sports Medicine

## 2022-10-04 DIAGNOSIS — M180 Bilateral primary osteoarthritis of first carpometacarpal joints: Secondary | ICD-10-CM

## 2022-10-04 DIAGNOSIS — G8929 Other chronic pain: Secondary | ICD-10-CM

## 2022-10-04 NOTE — Telephone Encounter (Signed)
Done

## 2022-10-04 NOTE — Telephone Encounter (Signed)
Can you do me a favor and take the wrist MRI out I put in a finger MRI instead per [facility]. She said no PA was required since it was an upper extremity

## 2022-10-07 ENCOUNTER — Ambulatory Visit: Payer: 59 | Admitting: Sports Medicine

## 2022-10-07 DIAGNOSIS — M1812 Unilateral primary osteoarthritis of first carpometacarpal joint, left hand: Secondary | ICD-10-CM

## 2022-10-07 DIAGNOSIS — M180 Bilateral primary osteoarthritis of first carpometacarpal joints: Secondary | ICD-10-CM

## 2022-10-07 DIAGNOSIS — M329 Systemic lupus erythematosus, unspecified: Secondary | ICD-10-CM | POA: Diagnosis not present

## 2022-10-07 MED ORDER — METHYLPREDNISOLONE 4 MG PO TBPK: ORAL_TABLET | ORAL | 0 refills | Status: DC

## 2022-10-07 NOTE — Progress Notes (Unsigned)
Maureen [name] - 56 y.o. female MRN [id number]  Date of birth: 12/31/1965  Office Visit Note: Visit Date: 10/07/2022 PCP: Sharion Balloon, FNP Referred by: Sharion Balloon, FNP  Subjective: No chief complaint on file.  HPI: Maureen [name] is a pleasant 56 y.o. female who presents today for follow-up of bilateral thumb pain with left greater than right.  After last visit, we did obtain an MRI of the thumb which showed severe osteoarthritis of the first [facility] joint with subchondral marrow edema as well as a small joint effusion.  There is some STT arthritic change as well.   She does have a history of lupus and is managed by rheumatology but is not on any DMARDs or maintenance medication for this 2/2 to side effects, photosensitivity.    Pertinent ROS were reviewed with the patient and found to be negative unless otherwise specified above in HPI.   Assessment & Plan: Visit Diagnoses: No diagnosis found.  Plan: ***  Follow-up: No follow-ups on file.   Meds & Orders: No orders of the defined types were placed in this encounter.  No orders of the defined types were placed in this encounter.    Procedures: No procedures performed      Clinical History: No specialty comments available.  She reports that she quit smoking about 14 years ago. Her smoking use included cigarettes. She has a 10.00 pack-year smoking history. She has never used smokeless tobacco. No results for input(s): "HGBA1C", "LABURIC" in the last 8760 hours.  Objective:    Physical Exam  Gen: Well-appearing, in no acute distress; non-toxic CV: Regular Rate. Well-perfused. Warm.  Resp: Breathing unlabored on room air; no wheezing. Psych: Fluid speech in conversation; appropriate affect; normal thought process Neuro: Sensation intact throughout. No gross coordination deficits.   Ortho Exam - Left thumb: There is significant tenderness to palpation at the [facility] joint, specific pain with CMC grind test.  There is a  degree of laxity/subluxation radially with rotation about the [facility] joint both at 30 and 60 degrees.  Negative Tinel's at the wrist.  There is some mild joint swelling over this region as well as the MCPs of her other 5 digits. NVI.  - Right thumb: No effusion or swelling noted.  There is positive TTP at the [facility] joint, positive CMC grind test.  Okay sign intact.  Full range of motion about the wrist. Negative tinel's test.  Imaging:  *Independent review of the left thumb MRI was reviewed and interpreted by myself.  MRI demonstrates advanced arthritic change at the [facility] joint, there is notable marrow edema throughout the joint, although worse on the base of the proximal phalanx.  Mild degree of instability noted.  There is a small joint effusion noted.  Incompletely evaluated but also notable STT arthritic change.   MR FINGERS LEFT W/O CM CLINICAL DATA:  Left thumb pain near the first metacarpal.  EXAM: MRI OF THE LEFT FINGERS WITHOUT CONTRAST  TECHNIQUE: Multiplanar, multisequence MR imaging of the left thumb was performed. No intravenous contrast was administered.  COMPARISON:  None Available.  FINDINGS: Bones/Joint/Cartilage  No fracture or dislocation. Normal alignment. No joint effusion. No erosive changes. No periostitis.  Severe osteoarthritis of the first [facility] joint with subchondral marrow edema and a small joint effusion. Mild osteoarthritis of the scaphotrapeziotrapezoid joint.  Ligaments  Collateral ligaments are intact.  Muscles and Tendons Flexor and extensor compartment tendons are intact. Muscles are normal.  Soft tissue No fluid collection or  hematoma.  No soft tissue mass.  IMPRESSION: 1. Severe osteoarthritis of the first [facility] joint with subchondral marrow edema and a small joint effusion. 2. Mild osteoarthritis of the scaphotrapeziotrapezoid joint.  Electronically Signed   By: Kathreen Devoid M.D.   On: 10/05/2022 13:32    Past  Medical/Family/Surgical/Social History: Medications & Allergies reviewed per EMR, new medications updated. Patient Active Problem List   Diagnosis Date Noted   Primary osteoarthritis 07/19/2022   Bipolar 1 disorder, manic, mild ([place]) 12/28/2021   Aortic atherosclerosis ([place]) 02/08/2021   Vision changes 05/28/2020   Paresthesia 05/28/2020   Cervical spondylosis with radiculopathy 07/01/2016   Spondylolisthesis 07/01/2016   Greater trochanteric bursitis of right hip 08/20/2015   Chronic interstitial cystitis 11/25/2014   H/O Spinal surgery 06/24/2014   Fibrositis 01/29/2014   Fibromyalgia 01/29/2014   Lumbosacral radiculitis 10/09/2013   Lumbar post-laminectomy syndrome 09/17/2013   Atypical chest pain 09/02/2013   Other long term (current) drug therapy 05/01/2013   Arthritis 04/16/2013   Abdominal pain, epigastric 03/26/2013   Back pain, chronic 12/05/2012   Connective tissue disease, undifferentiated ([place]) 12/05/2012   Raynaud's syndrome without gangrene 12/05/2012   Systemic lupus erythematosus ([place]) 12/05/2012   Cerebrospinal fluid leak from spinal puncture 08/24/2012   UNSPECIFIED DISORDER OF STOMACH AND DUODENUM 08/26/2008   Past Medical History:  Diagnosis Date   Abdominal pain    Allergy    Anxiety    takes Xanax daily as needed   Arthritis    Cancer ([place])    MOLE PRE   Cerebrospinal fluid leak from spinal puncture 08/24/2012   Chronic back pain    Constipation    takes Dulcolax and Miralax daily as needed;also Fiber Pills   Depression    takes Lamictal daily   Fibromyalgia    GERD (gastroesophageal reflux disease)    takes Omeprazole daily   Headache(784.0)    Heart murmur    slight   History of bronchitis    last time many yrs ago(20yr ago)   Internal hemorrhoid    Interstitial cystitis    no problems since beginning of Jan 2014   Joint pain    Joint swelling    [place]us [place]    takes Plaquenil daily   Muscle spasm    takes Robaxin daily as needed    Numbness and tingling in hands    Osteoarthritis    Pneumonia    hx of > 986yrago   Raynaud's disease    Urinary frequency    Urinary urgency    Family History  Problem Relation Age of Onset   Hyperlipidemia Mother    Depression Mother    Anxiety disorder Mother    Breast cancer Mother    Colon polyps Father    Breast cancer Maternal Grandmother    Alzheimer's disease Maternal Grandmother    Ovarian cancer Maternal Aunt    Colon cancer Maternal Uncle    Colon cancer Paternal Aunt    Esophageal cancer Neg Hx    Pancreatic cancer Neg Hx    Stomach cancer Neg Hx    Rectal cancer Neg Hx    Past Surgical History:  Procedure Laterality Date   ABDOMINAL HERNIA REPAIR     APPENDECTOMY     CESAREAN SECTION     CHOLECYSTECTOMY     COLONOSCOPY     DIAGNOSTIC LAPAROSCOPY     adhesions   ENDOMETRIAL ABLATION     ESOPHAGOGASTRODUODENOSCOPY N/A 05/08/2013   Procedure: ESOPHAGOGASTRODUODENOSCOPY (EGD);  Surgeon:  Rogene Houston, MD;  [facility]: [facility];  Service: Endoscopy;  Laterality: N/A;  315   NASAL SEPTUM SURGERY     OVARIAN CYST REMOVAL     SHOULDER SURGERY Right    x 2   SPINAL CORD STIMULATOR INSERTION N/A 06/13/2014   Procedure: LUMBAR SPINAL CORD STIMULATOR INSERTION;  Surgeon: Bonna Gains, MD;  Location: [facility];  Service: Neurosurgery;  Laterality: N/A;   SPINAL FUSION     x 2    TONSILLECTOMY     TUBAL LIGATION     uterine ablation     VAGINAL HYSTERECTOMY  8/12   Social History   Occupational History   Not on file  Tobacco Use   Smoking status: Former    Packs/day: 0.50    Years: 20.00    Total pack years: 10.00    Types: Cigarettes    Quit date: 06/09/2008    Years since quitting: 14.3   Smokeless tobacco: Never  Vaping Use   Vaping Use: Never used  Substance and Sexual Activity   Alcohol use: Yes    Comment: occassionaly   Drug use: No   Sexual activity: Not Currently    Partners: Male    Birth control/protection: Surgical

## 2022-10-08 ENCOUNTER — Encounter: Payer: Self-pay | Admitting: Sports Medicine

## 2022-10-27 ENCOUNTER — Telehealth: Payer: Self-pay | Admitting: Sports Medicine

## 2022-10-27 NOTE — Telephone Encounter (Signed)
Received call from patient. She stated Dr. Rolena Infante referred her to Dr. Milly Jakob, her appointment is next week and they don't have her medical records. I faxed records [phone]

## 2022-12-15 ENCOUNTER — Encounter: Payer: Self-pay | Admitting: Family Medicine

## 2022-12-15 ENCOUNTER — Telehealth (INDEPENDENT_AMBULATORY_CARE_PROVIDER_SITE_OTHER): Payer: 59 | Admitting: Family Medicine

## 2022-12-15 DIAGNOSIS — U071 COVID-19: Secondary | ICD-10-CM

## 2022-12-15 DIAGNOSIS — J0140 Acute pansinusitis, unspecified: Secondary | ICD-10-CM | POA: Diagnosis not present

## 2022-12-15 MED ORDER — CEFDINIR 300 MG PO CAPS: 300.0000 mg | ORAL_CAPSULE | Freq: Two times a day (BID) | ORAL | 0 refills | Status: DC

## 2022-12-15 NOTE — Progress Notes (Signed)
Virtual Visit via MyChart Video Note Due to COVID-19 pandemic this visit was conducted virtually. This visit type was conducted due to national recommendations for restrictions regarding the COVID-19 Pandemic (e.g. social distancing, sheltering in place) in an effort to limit this patient's exposure and mitigate transmission in our community. All issues noted in this document were discussed and addressed.  A physical exam was not performed with this format.   I connected with Maureen [name] on 12/15/2022 at 1215 by MyChart Video and verified that I am speaking with the correct person using two identifiers. Maureen [name] is currently located at home and patient is currently with them during visit. The provider, Monia Pouch, FNP is located in their office at time of visit.  I discussed the limitations, risks, security and privacy concerns of performing an evaluation and management service by virtual visit and the availability of in person appointments. I also discussed with the patient that there may be a patient responsible charge related to this service. The patient expressed understanding and agreed to proceed.  Subjective:  Patient ID: Maureen [name], female    DOB: 12-10-1965, 57 y.o.   MRN: [id number]  Chief Complaint:  Covid Positive and Sinusitis   HPI: Maureen [name] is a 57 y.o. female presenting on 12/15/2022 for Covid Positive and Sinusitis   Pt reports she tested positive for COVID 12/03/2022. States she was sick for several days, got better for a few days and then became sick again with fever, chills, headache, and sinus pressure.   Sinusitis This is a new problem. The current episode started in the past 7 days. The maximum temperature recorded prior to her arrival was 101 - 101.9 F. The pain is moderate. Associated symptoms include chills, congestion, coughing, headaches and sinus pressure. Pertinent negatives include no diaphoresis, ear pain, hoarse voice, neck pain, shortness  of breath, sneezing, sore throat or swollen glands. Past treatments include acetaminophen. The treatment provided no relief.     Relevant past medical, surgical, family, and social history reviewed and updated as indicated.  Allergies and medications reviewed and updated.   Past Medical History:  Diagnosis Date   Abdominal pain    Allergy    Anxiety    takes Xanax daily as needed   Arthritis    Cancer (HCC)    MOLE PRE   Cerebrospinal fluid leak from spinal puncture 08/24/2012   Chronic back pain    Constipation    takes Dulcolax and Miralax daily as needed;also Fiber Pills   Depression    takes Lamictal daily   Fibromyalgia    GERD (gastroesophageal reflux disease)    takes Omeprazole daily   Headache(784.0)    Heart murmur    slight   History of bronchitis    last time many yrs ago(60yr ago)   Internal hemorrhoid    Interstitial cystitis    no problems since beginning of Jan 2014   Joint pain    Joint swelling    [place]    takes Plaquenil daily   Muscle spasm    takes Robaxin daily as needed   Numbness and tingling in hands    Osteoarthritis    Pneumonia    hx of > 850yrago   Raynaud's disease    Urinary frequency    Urinary urgency     Past Surgical History:  Procedure Laterality Date   ABDOMINAL HERNIA REPAIR     APPENDECTOMY     CESAREAN SECTION  CHOLECYSTECTOMY     COLONOSCOPY     DIAGNOSTIC LAPAROSCOPY     adhesions   ENDOMETRIAL ABLATION     ESOPHAGOGASTRODUODENOSCOPY N/A 05/08/2013   Procedure: ESOPHAGOGASTRODUODENOSCOPY (EGD);  Surgeon: Rogene Houston, MD;  [facility]: [facility];  Service: Endoscopy;  Laterality: N/A;  315   NASAL SEPTUM SURGERY     OVARIAN CYST REMOVAL     SHOULDER SURGERY Right    x 2   SPINAL CORD STIMULATOR INSERTION N/A 06/13/2014   Procedure: LUMBAR SPINAL CORD STIMULATOR INSERTION;  Surgeon: Bonna Gains, MD;  Location: [facility];  Service: Neurosurgery;  Laterality: N/A;   SPINAL FUSION     x 2     TONSILLECTOMY     TUBAL LIGATION     uterine ablation     VAGINAL HYSTERECTOMY  8/12    Social History   Socioeconomic History   Marital status: Legally Separated    Spouse name: Not on file   Number of children: Not on file   Years of education: Not on file   Highest education level: Not on file  Occupational History   Not on file  Tobacco Use   Smoking status: Former    Packs/day: 0.50    Years: 20.00    Total pack years: 10.00    Types: Cigarettes    Quit date: 06/09/2008    Years since quitting: 14.5   Smokeless tobacco: Never  Vaping Use   Vaping Use: Never used  Substance and Sexual Activity   Alcohol use: Yes    Comment: occassionaly   Drug use: No   Sexual activity: Not Currently    Partners: Male    Birth control/protection: Surgical  Other Topics Concern   Not on file  Social History Narrative   Not on file   Social Determinants of Health   Financial Resource Strain: Not on file  Food Insecurity: Not on file  Transportation Needs: Not on file  Physical Activity: Not on file  Stress: Not on file  Social Connections: Not on file  Intimate Partner Violence: Not on file    Outpatient Encounter Medications as of 12/15/2022  Medication Sig   cefdinir (OMNICEF) 300 MG capsule Take 1 capsule (300 mg total) by mouth 2 (two) times daily for 7 days. 1 po BID   Cholecalciferol (VITAMIN D3) 5000 units CAPS Take 1 tablet by mouth daily.   diclofenac Sodium (VOLTAREN) 1 % GEL Apply 4 g topically 4 (four) times daily.   estradiol (VIVELLE-DOT) 0.075 MG/24HR Place 1 patch onto the skin 2 (two) times a week.   Estradiol 10 MCG TABS vaginal tablet Place 1 tablet (10 mcg total) vaginally 2 (two) times a week.   famotidine (PEPCID) 20 MG tablet Take 1 tablet (20 mg total) by mouth at bedtime. (Patient taking differently: Take 20 mg by mouth as needed.)   hydrochlorothiazide (HYDRODIURIL) 12.5 MG tablet Take 12.5 mg by mouth as needed.   magnesium oxide (MAG-OX) 400 MG  tablet Take 400 mg by mouth daily.   Melatonin-Pyridoxine (MELATIN PO) Take 1 tablet by mouth at bedtime as needed.   methylPREDNISolone (MEDROL DOSEPAK) 4 MG TBPK tablet Take per packet instructions. 6 tabs day 1, 5 tabs day 2, ....   TURMERIC PO Take 1 tablet by mouth daily.   No facility-administered encounter medications on file as of 12/15/2022.    Allergies  Allergen Reactions   Gabapentin Other (See Comments)    Made paranoia   Amoxicillin Rash  Penicillins Rash    Has patient had a PCN reaction causing immediate rash, facial/tongue/throat swelling, SOB or lightheadedness with hypotension: yes Has patient had a PCN reaction causing severe rash involving mucus membranes or skin necrosis: yes pt did experience rash but it was not severe  Has patient had a PCN reaction that required hospitalization: no Has patient had a PCN reaction occurring within the last 10 years: no If all of the above answers are "NO", then may proceed with Cephalosporin use.    Tegretol [Carbamazepine] Rash    Review of Systems  Constitutional:  Positive for activity change, appetite change, chills, fatigue and fever. Negative for diaphoresis and unexpected weight change.  HENT:  Positive for congestion, postnasal drip, sinus pressure and sinus pain. Negative for dental problem, drooling, ear discharge, ear pain, facial swelling, hearing loss, hoarse voice, mouth sores, nosebleeds, rhinorrhea, sneezing, sore throat, tinnitus, trouble swallowing and voice change.   Respiratory:  Positive for cough. Negative for apnea, choking, chest tightness, shortness of breath, wheezing and stridor.   Cardiovascular:  Negative for chest pain, palpitations and leg swelling.  Genitourinary:  Negative for decreased urine volume and difficulty urinating.  Musculoskeletal:  Negative for neck pain.  Neurological:  Positive for headaches. Negative for dizziness, tremors, seizures, syncope, facial asymmetry, speech difficulty,  weakness, light-headedness and numbness.  Psychiatric/Behavioral:  Negative for confusion.   All other systems reviewed and are negative.        Observations/Objective: No vital signs or physical exam, this was a virtual health encounter.  Pt alert and oriented, answers all questions appropriately, and able to speak in full sentences.    Assessment and Plan: Micaiah was seen today for covid positive and sinusitis.  Diagnoses and all orders for this visit:  COVID Positive on 12/03/2022, got better and then became sick again.   Acute non-recurrent pansinusitis Secondary sickness post COVID infection. Will treat with below. Pt aware to continue symptomatic care. Report new, worsening, or persistent symptoms.  -     cefdinir (OMNICEF) 300 MG capsule; Take 1 capsule (300 mg total) by mouth 2 (two) times daily for 7 days. 1 po BID     Follow Up Instructions: Return if symptoms worsen or fail to improve.    I discussed the assessment and treatment plan with the patient. The patient was provided an opportunity to ask questions and all were answered. The patient agreed with the plan and demonstrated an understanding of the instructions.   The patient was advised to call back or seek an in-person evaluation if the symptoms worsen or if the condition fails to improve as anticipated.  The above assessment and management plan was discussed with the patient. The patient verbalized understanding of and has agreed to the management plan. Patient is aware to call the clinic if they develop any new symptoms or if symptoms persist or worsen. Patient is aware when to return to the clinic for a follow-up visit. Patient educated on when it is appropriate to go to the emergency department.    I provided 15 minutes of time during this MyChart Video encounter.   Monia Pouch, FNP-C [facility] [address] [place], [place] [address] [phone] 12/15/2022

## 2022-12-16 ENCOUNTER — Telehealth: Payer: Self-pay | Admitting: Family Medicine

## 2022-12-16 MED ORDER — PREDNISONE 20 MG PO TABS: 40.0000 mg | ORAL_TABLET | Freq: Every day | ORAL | 0 refills | Status: AC

## 2022-12-16 MED ORDER — DOXYCYCLINE HYCLATE 100 MG PO TABS: 100.0000 mg | ORAL_TABLET | Freq: Two times a day (BID) | ORAL | 0 refills | Status: AC

## 2022-12-16 NOTE — Telephone Encounter (Signed)
Pt says that she is not able to tolerate cefdinir (OMNICEF) 300 MG capsule. Pt is making her nauseous. Pt is asking for doxycycline. She did not want to take to begin with because if her lupus but she is willing to take the doxycycline. Use [facility] in [place]

## 2022-12-16 NOTE — Telephone Encounter (Signed)
Patient aware and verbalizes understanding. 

## 2022-12-16 NOTE — Addendum Note (Signed)
Addended by: Baruch Gouty on: 12/16/2022 03:46 PM   Modules accepted: Orders

## 2022-12-16 NOTE — Addendum Note (Signed)
Addended by: Baruch Gouty on: 12/16/2022 10:14 AM   Modules accepted: Orders

## 2022-12-16 NOTE — Telephone Encounter (Signed)
Pt called to let Sharyn Lull know that she changed her mind. She does want Sharyn Lull to send in Prednisone pack for her. Feeling worse today. [facility] [place].

## 2022-12-26 ENCOUNTER — Ambulatory Visit: Payer: 59 | Admitting: Obstetrics and Gynecology

## 2022-12-30 NOTE — Progress Notes (Signed)
57 y.o. [id number] Legally Separated White or Caucasian Not Hispanic or Latino female here for annual exam.   H/O hysterectomy, on transdermal estrogen.  Still having some hot flashes on the estrogen, overall tolerable. Doesn't want to decrease her dose.  Sexually active, no pain. Same partner x 3 years (don't live together).     She see's Rheumatology for a lupus like syndrome (not officially diagnosed). Rheumatologist is fine with her being on ERT, blood work is all normal.   She is in chronic pain, she has rods and 12 screws in her back.   H/O IC, has problems. She has mixed incontinence.   She has constipation, sometimes to push on her vagina to empty her rectum. Has a BM every 1-2 days. She takes magnesium.   She c/o hemorrhoids.  No LMP recorded. Patient has had a hysterectomy.          Sexually active: Yes.    The current method of family planning is status post hysterectomy.    Exercising: Yes.     Walking  Smoker:  former  Health Maintenance: Pap:  2012 WNL per pt History of abnormal Pap:  no MMG:  08/19/22 Breast Density Category C, BI-RADS CATEGORY 1 neg BMD:   n/a Colonoscopy: 08/19/14, f/u recommended in 10/25. Endoscopy 07/2022 TDaP:  unsure Gardasil: n/a   reports that she quit smoking about 14 years ago. Her smoking use included cigarettes. She has a 10.00 pack-year smoking history. She has never used smokeless tobacco. She reports current alcohol use. She reports that she does not use drugs. 1 ETOH drink most days. 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 3 grown kids. 3 grandchildren.   Past Medical History:  Diagnosis Date   Abdominal pain    Allergy    Anxiety    takes Xanax daily as needed   Arthritis    Cancer ([place])    MOLE PRE   Cerebrospinal fluid leak from spinal puncture 08/24/2012   Chronic back pain    Constipation    takes Dulcolax and Miralax daily as needed;also Fiber Pills   Depression    takes Lamictal daily   Fibromyalgia    GERD  (gastroesophageal reflux disease)    takes Omeprazole daily   Headache(784.0)    Heart murmur    slight   History of bronchitis    last time many yrs ago(66yrs ago)   Internal hemorrhoid    Interstitial cystitis    no problems since beginning of Jan 2014   Joint pain    Joint swelling    Lupus ([place])    takes Plaquenil daily   Muscle spasm    takes Robaxin daily as needed   Numbness and tingling in hands    Osteoarthritis    Pneumonia    hx of > 77yrs ago   Raynaud's disease    Urinary frequency    Urinary urgency     Past Surgical History:  Procedure Laterality Date   ABDOMINAL HERNIA REPAIR     APPENDECTOMY     CESAREAN SECTION     CHOLECYSTECTOMY     COLONOSCOPY     DIAGNOSTIC LAPAROSCOPY     adhesions   ENDOMETRIAL ABLATION     ESOPHAGOGASTRODUODENOSCOPY N/A 05/08/2013   Procedure: ESOPHAGOGASTRODUODENOSCOPY (EGD);  Surgeon: Rogene Houston, MD;  [facility];  Service: Endoscopy;  Laterality: N/A;  315   NASAL SEPTUM SURGERY     OVARIAN CYST REMOVAL     SHOULDER SURGERY Right  x 2   SPINAL CORD STIMULATOR INSERTION N/A 06/13/2014   Procedure: LUMBAR SPINAL CORD STIMULATOR INSERTION;  Surgeon: Bonna Gains, MD;  Location: [facility];  Service: Neurosurgery;  Laterality: N/A;   SPINAL FUSION     x 2    TONSILLECTOMY     TUBAL LIGATION     uterine ablation     VAGINAL HYSTERECTOMY  8/12    Current Outpatient Medications  Medication Sig Dispense Refill   Cholecalciferol (VITAMIN D3) 5000 units CAPS Take 1 tablet by mouth daily.     estradiol (VIVELLE-DOT) 0.075 MG/24HR Place 1 patch onto the skin 2 (two) times a week. 24 patch 3   Estradiol 10 MCG TABS vaginal tablet Place 1 tablet (10 mcg total) vaginally 2 (two) times a week. 24 tablet 1   famotidine (PEPCID) 20 MG tablet Take 1 tablet (20 mg total) by mouth at bedtime. (Patient taking differently: Take 20 mg by mouth as needed.) 30 tablet 3   hydrochlorothiazide (HYDRODIURIL) 12.5 MG tablet  Take 12.5 mg by mouth as needed.     magnesium oxide (MAG-OX) 400 MG tablet Take 400 mg by mouth daily.     TURMERIC PO Take 1 tablet by mouth daily.     No current facility-administered medications for this visit.    Family History  Problem Relation Age of Onset   Hyperlipidemia Mother    Depression Mother    Anxiety disorder Mother    Breast cancer Mother    Lung cancer Mother    Colon polyps Father    Breast cancer Maternal Grandmother    Alzheimer's disease Maternal Grandmother    Ovarian cancer Maternal Aunt    Colon cancer Maternal Uncle    Colon cancer Paternal Aunt    Esophageal cancer Neg Hx    Pancreatic cancer Neg Hx    Stomach cancer Neg Hx    Rectal cancer Neg Hx     Review of Systems  Genitourinary:        Worsening back pain  All other systems reviewed and are negative.   Exam:   BP 110/74 (BP Location: Right Arm, Patient Position: Sitting, Cuff Size: Normal)   Pulse 70   Ht 5\' 3"  (1.6 m)   Wt 142 lb (64.4 kg)   SpO2 100%   BMI 25.15 kg/m   Weight change: @WEIGHTCHANGE @ Height:   Height: 5\' 3"  (160 cm)  Ht Readings from Last 3 Encounters:  01/13/23 5\' 3"  (1.6 m)  08/02/22 5\' 3"  (1.6 m)  07/19/22 5\' 4"  (1.626 m)    General appearance: alert, cooperative and appears stated age Head: Normocephalic, without obvious abnormality, atraumatic Neck: no adenopathy, supple, symmetrical, trachea midline and thyroid normal to inspection and palpation Lungs: clear to auscultation bilaterally Cardiovascular: regular rate and rhythm Breasts: normal appearance, no masses or tenderness Abdomen: soft, non-tender; non distended,  no masses,  no organomegaly Extremities: extremities normal, atraumatic, no cyanosis or edema Skin: Skin color, texture, turgor normal. No rashes or lesions Lymph nodes: Cervical, supraclavicular, and axillary nodes normal. No abnormal inguinal nodes palpated Neurologic: Grossly normal   Pelvic: External genitalia:  no lesions               Urethra:  normal appearing urethra with no masses, tenderness or lesions              Bartholins and Skenes: normal                 Vagina: normal  appearing vagina with normal color and discharge, no lesions              Cervix: absent               Bimanual Exam:  Uterus:  uterus absent              Adnexa: no mass, fullness, tenderness               Rectovaginal: Confirms               Anus:  normal sphincter tone, no lesions  Pelvic floor: not tender  Kegel strength 2/5  Carrolyn Leigh, CMA chaperoned for the exam.  1. Well woman exam Discussed breast self exam Discussed calcium and vit D intake Labs with primary Mammogram and colonoscopy UTD No pap needed  2. Mixed incontinence - Urinalysis,Complete w/RFL Culture - Ambulatory referral to Physical Therapy  3. Encounter for monitoring postmenopausal estrogen replacement therapy Wants to continue, aware of risks - estradiol (VIVELLE-DOT) 0.075 MG/24HR; Place 1 patch onto the skin 2 (two) times a week.  Dispense: 24 patch; Refill: 3  4. Vaginal atrophy - Estradiol 10 MCG TABS vaginal tablet; Place 1 tablet (10 mcg total) vaginally 2 (two) times a week.  Dispense: 24 tablet; Refill: 3

## 2023-01-13 ENCOUNTER — Ambulatory Visit (INDEPENDENT_AMBULATORY_CARE_PROVIDER_SITE_OTHER): Payer: 59 | Admitting: Obstetrics and Gynecology

## 2023-01-13 ENCOUNTER — Encounter: Payer: Self-pay | Admitting: Obstetrics and Gynecology

## 2023-01-13 VITALS — BP 110/74 | HR 70 | Ht 63.0 in | Wt 142.0 lb

## 2023-01-13 DIAGNOSIS — Z5181 Encounter for therapeutic drug level monitoring: Secondary | ICD-10-CM

## 2023-01-13 DIAGNOSIS — N952 Postmenopausal atrophic vaginitis: Secondary | ICD-10-CM

## 2023-01-13 DIAGNOSIS — Z79890 Hormone replacement therapy (postmenopausal): Secondary | ICD-10-CM

## 2023-01-13 DIAGNOSIS — Z01419 Encounter for gynecological examination (general) (routine) without abnormal findings: Secondary | ICD-10-CM | POA: Diagnosis not present

## 2023-01-13 DIAGNOSIS — N3946 Mixed incontinence: Secondary | ICD-10-CM | POA: Diagnosis not present

## 2023-01-13 LAB — URINALYSIS, COMPLETE
Bacteria, UA: NONE SEEN /HPF
Bilirubin Urine: NEGATIVE
Casts: NONE SEEN /LPF
Crystals: NONE SEEN /HPF
Glucose, UA: NEGATIVE
Hgb urine dipstick: NEGATIVE
Hyaline Cast: NONE SEEN /LPF
Ketones, ur: NEGATIVE
Leukocytes,Ua: NEGATIVE
Nitrite: NEGATIVE
Protein, ur: NEGATIVE
RBC / HPF: NONE SEEN /HPF (ref 0–2)
Specific Gravity, Urine: 1.015 (ref 1.001–1.035)
WBC, UA: NONE SEEN /HPF (ref 0–5)
Yeast: NONE SEEN /HPF
pH: 7 (ref 5.0–8.0)

## 2023-01-13 MED ORDER — ESTRADIOL 0.075 MG/24HR TD PTTW: 1.0000 | MEDICATED_PATCH | TRANSDERMAL | 3 refills | Status: DC

## 2023-01-13 MED ORDER — ESTRADIOL 10 MCG VA TABS: 1.0000 | ORAL_TABLET | VAGINAL | 3 refills | Status: DC

## 2023-01-13 NOTE — Patient Instructions (Signed)
EXERCISE   We recommended that you start or continue a regular exercise program for good health. Physical activity is anything that gets your body moving, some is better than none. The CDC recommends 150 minutes per week of Moderate-Intensity Aerobic Activity and 2 or more days of Muscle Strengthening Activity.  Benefits of exercise are limitless: helps weight loss/weight maintenance, improves mood and energy, helps with depression and anxiety, improves sleep, tones and strengthens muscles, improves balance, improves bone density, protects from chronic conditions such as heart disease, high blood pressure and diabetes and so much more. To learn more visit: https://www.cdc.gov/physicalactivity/index.html  DIET: Good nutrition starts with a healthy diet of fruits, vegetables, whole grains, and lean protein sources. Drink plenty of water for hydration. Minimize empty calories, sodium, sweets. For more information about dietary recommendations visit: https://health.gov/our-work/nutrition-physical-activity/dietary-guidelines and https://www.myplate.gov/  ALCOHOL:  Women should limit their alcohol intake to no more than 7 drinks/beers/glasses of wine (combined, not each!) per week. Moderation of alcohol intake to this level decreases your risk of breast cancer and liver damage.  If you are concerned that you may have a problem, or your friends have told you they are concerned about your drinking, there are many resources to help. A well-known program that is free, effective, and available to all people all over the nation is Alcoholics Anonymous.  Check out this site to learn more: https://www.aa.org/   CALCIUM AND VITAMIN D:  Adequate intake of calcium and Vitamin D are recommended for bone health.  You should be getting between 1000-1200 mg of calcium and 800 units of Vitamin D daily between diet and supplements  PAP SMEARS:  Pap smears, to check for cervical cancer or precancers,  have traditionally been  done yearly, scientific advances have shown that most women can have pap smears less often.  However, every woman still should have a physical exam from her gynecologist every year. It will include a breast check, inspection of the vulva and vagina to check for abnormal growths or skin changes, a visual exam of the cervix, and then an exam to evaluate the size and shape of the uterus and ovaries. We will also provide age appropriate advice regarding health maintenance, like when you should have certain vaccines, screening for sexually transmitted diseases, bone density testing, colonoscopy, mammograms, etc.   MAMMOGRAMS:  All women over 40 years old should have a routine mammogram.   COLON CANCER SCREENING: Now recommend starting at age 45. At this time colonoscopy is not covered for routine screening until 50. There are take home tests that can be done between 45-49.   COLONOSCOPY:  Colonoscopy to screen for colon cancer is recommended for all women at age 50.  We know, you hate the idea of the prep.  We agree, BUT, having colon cancer and not knowing it is worse!!  Colon cancer so often starts as a polyp that can be seen and removed at colonscopy, which can quite literally save your life!  And if your first colonoscopy is normal and you have no family history of colon cancer, most women don't have to have it again for 10 years.  Once every ten years, you can do something that may end up saving your life, right?  We will be happy to help you get it scheduled when you are ready.  Be sure to check your insurance coverage so you understand how much it will cost.  It may be covered as a preventative service at no cost, but you should check   your particular policy.      Breast Self-Awareness Breast self-awareness means being familiar with how your breasts look and feel. It involves checking your breasts regularly and reporting any changes to your health care provider. Practicing breast self-awareness is  important. A change in your breasts can be a sign of a serious medical problem. Being familiar with how your breasts look and feel allows you to find any problems early, when treatment is more likely to be successful. All women should practice breast self-awareness, including women who have had breast implants. How to do a breast self-exam One way to learn what is normal for your breasts and whether your breasts are changing is to do a breast self-exam. To do a breast self-exam: Look for Changes  Remove all the clothing above your waist. Stand in front of a mirror in a room with good lighting. Put your hands on your hips. Push your hands firmly downward. Compare your breasts in the mirror. Look for differences between them (asymmetry), such as: Differences in shape. Differences in size. Puckers, dips, and bumps in one breast and not the other. Look at each breast for changes in your skin, such as: Redness. Scaly areas. Look for changes in your nipples, such as: Discharge. Bleeding. Dimpling. Redness. A change in position. Feel for Changes Carefully feel your breasts for lumps and changes. It is best to do this while lying on your back on the floor and again while sitting or standing in the shower or tub with soapy water on your skin. Feel each breast in the following way: Place the arm on the side of the breast you are examining above your head. Feel your breast with the other hand. Start in the nipple area and make  inch (2 cm) overlapping circles to feel your breast. Use the pads of your three middle fingers to do this. Apply light pressure, then medium pressure, then firm pressure. The light pressure will allow you to feel the tissue closest to the skin. The medium pressure will allow you to feel the tissue that is a little deeper. The firm pressure will allow you to feel the tissue close to the ribs. Continue the overlapping circles, moving downward over the breast until you feel your  ribs below your breast. Move one finger-width toward the center of the body. Continue to use the  inch (2 cm) overlapping circles to feel your breast as you move slowly up toward your collarbone. Continue the up and down exam using all three pressures until you reach your armpit.  Write Down What You Find  Write down what is normal for each breast and any changes that you find. Keep a written record with breast changes or normal findings for each breast. By writing this information down, you do not need to depend only on memory for size, tenderness, or location. Write down where you are in your menstrual cycle, if you are still menstruating. If you are having trouble noticing differences in your breasts, do not get discouraged. With time you will become more familiar with the variations in your breasts and more comfortable with the exam. How often should I examine my breasts? Examine your breasts every month. If you are breastfeeding, the best time to examine your breasts is after a feeding or after using a breast pump. If you menstruate, the best time to examine your breasts is 5-7 days after your period is over. During your period, your breasts are lumpier, and it may be more   difficult to notice changes. When should I see my health care provider? See your health care provider if you notice: A change in shape or size of your breasts or nipples. A change in the skin of your breast or nipples, such as a reddened or scaly area. Unusual discharge from your nipples. A lump or thick area that was not there before. Pain in your breasts. Anything that concerns you. Menopause and Hormone Replacement Therapy Menopause is a normal time of life when menstrual periods stop completely and the ovaries stop producing the female hormones estrogen and progesterone. Low levels of these hormones can affect your health and cause symptoms. Hormone replacement therapy (HRT) can relieve some of those symptoms. HRT is  the use of artificial (synthetic) hormones to replace hormones that your body has stopped producing because you have reached menopause. Types of HRT  HRT may consist of the synthetic hormones estrogen and progestin, or it may consist of estrogen-only therapy. You and your health care provider will decide which form of HRT is best for you. If you choose to be on HRT and you have a uterus, estrogen and progestin are usually prescribed. Estrogen-only therapy is used for women who do not have a uterus. Possible options for taking HRT include: Pills. Patches. Gels. Sprays. Vaginal cream. Vaginal rings. Vaginal inserts. The amount of hormones that you take and how long you take them varies according to your health. It is important to: Begin HRT with the lowest possible dosage. Stop HRT as soon as your health care provider tells you to stop. Work with your health care provider so that you feel informed and comfortable with your decisions. Tell a health care provider about: Any allergies you have. Whether you have had blood clots or know of any risk factors you may have for blood clots. Whether you or family members have had cancer, especially cancer of the breasts, ovaries, or uterus. Any surgeries you have had. All medicines you are taking, including vitamins, herbs, eye drops, creams, and over-the-counter medicines. Whether you are pregnant or may be pregnant. Any medical conditions you have. What are the benefits? HRT can reduce the frequency and severity of menopausal symptoms. Benefits of HRT vary according to the kind of symptoms that you have, how severe they are, and your overall health. HRT may help to improve the following symptoms of menopause: Hot flashes and night sweats. These are sudden feelings of heat that spread over the face and body. The skin may turn red, like a blush. Night sweats are hot flashes that happen while you are sleeping or trying to sleep. Bone loss  (osteoporosis). The body loses calcium more quickly after menopause, causing the bones to become weaker. This can increase the risk for bone breaks (fractures). Vaginal dryness. The lining of the vagina can become thin and dry, which can cause pain during sex or cause infection, burning, or itching. Urinary tract infections. Urinary incontinence. This is the inability to control when you urinate. Irritability. Short-term memory problems. What are the risks? Risks of HRT vary depending on your individual health and medical history. Risks of HRT also depend on whether you receive both estrogen and progestin or you receive estrogen only. HRT may increase the risk of: Spotting. This is when a small amount of blood leaks from the vagina unexpectedly. Endometrial cancer. This cancer is in the lining of the uterus (endometrium). Breast cancer. Increased density of breast tissue. This can make it harder to find breast cancer on a   breast X-ray (mammogram). Stroke. Heart disease. Blood clots. Gallbladder disease or liver disease. Risks of HRT can increase if you have any of the following conditions: Endometrial cancer. Liver disease. Heart disease. Breast cancer. History of blood clots. History of stroke. Follow these instructions at home: Pap tests Have Pap tests done as often as told by your health care provider. A Pap test is sometimes called a Pap smear. It is a screening test that is used to check for signs of cancer of the cervix and vagina. A Pap test can also identify the presence of infection or precancerous changes. Pap tests may be done: Every 3 years, starting at age 21. Every 5 years, starting after age 30, in combination with testing for human papillomavirus (HPV). More often or less often depending on other medical conditions you have, your age, and other risk factors. It is up to you to get the results of your Pap test. Ask your health care provider, or the department that is doing  the test, when your results will be ready. General instructions Take over-the-counter and prescription medicines only as told by your health care provider. Do not use any products that contain nicotine or tobacco. These products include cigarettes, chewing tobacco, and vaping devices, such as e-cigarettes. If you need help quitting, ask your health care provider. Get mammograms, pelvic exams, and medical checkups as often as told by your health care provider. Keep all follow-up visits. This is important. Contact a health care provider if you have: Pain or swelling in your legs. Lumps or changes in your breasts or armpits. Pain, burning, or bleeding when you urinate. Unusual vaginal bleeding. Dizziness or headaches. Pain in your abdomen. Get help right away if you have: Shortness of breath. Chest pain. Slurred speech. Weakness or numbness in any part of your arms or legs. These symptoms may represent a serious problem that is an emergency. Do not wait to see if the symptoms will go away. Get medical help right away. Call your local emergency services (911 in the [place] Do not drive yourself to the hospital. Summary Menopause is a normal time of life when menstrual periods stop completely and the ovaries stop producing the female hormones estrogen and progesterone. HRT can reduce the frequency and severity of menopausal symptoms. Risks of HRT vary depending on your individual health and medical history. This information is not intended to replace advice given to you by your health care provider. Make sure you discuss any questions you have with your health care provider. Document Revised: 04/13/2020 Document Reviewed: 04/13/2020 Elsevier Patient Education  2023 [employer]  

## 2023-01-13 NOTE — Addendum Note (Signed)
Addended by: Dorothy Spark on: 01/13/2023 12:15 PM   Modules accepted: Orders

## 2023-02-07 ENCOUNTER — Encounter: Payer: Self-pay | Admitting: Family Medicine

## 2023-02-10 ENCOUNTER — Other Ambulatory Visit: Payer: Self-pay | Admitting: Obstetrics and Gynecology

## 2023-02-10 DIAGNOSIS — Z5181 Encounter for therapeutic drug level monitoring: Secondary | ICD-10-CM

## 2023-02-13 ENCOUNTER — Encounter: Payer: Self-pay | Admitting: Family Medicine

## 2023-02-13 ENCOUNTER — Telehealth: Payer: 59 | Admitting: Family Medicine

## 2023-02-13 DIAGNOSIS — M329 Systemic lupus erythematosus, unspecified: Secondary | ICD-10-CM

## 2023-02-13 MED ORDER — PREDNISONE 20 MG PO TABS: ORAL_TABLET | ORAL | 0 refills | Status: DC

## 2023-02-13 NOTE — Progress Notes (Signed)
Virtual Visit via MyChart video note  I connected with Maureen [name] on 02/13/23 at 1446 by video and verified that I am speaking with the correct person using two identifiers. Maureen [name] is currently located at work and patient are currently with her during visit. The provider, Elige Radon Romelo Sciandra, MD is located in their office at time of visit.  Call ended at 1453  I discussed the limitations, risks, security and privacy concerns of performing an evaluation and management service by video and the availability of in person appointments. I also discussed with the patient that there may be a patient responsible charge related to this service. The patient expressed understanding and agreed to proceed.   History and Present Illness: Patient is calling in for lupus issues.  She does not have rheumatologist now.  She is get swelling in hands and joints. She gets the rash from sun and heat.  She takes some ibuprofen and tylenol.  She is having fatigue and is taking care of her mother and needs energy.  She normally takes turmeric.  1. Systemic lupus erythematosus, unspecified SLE type, unspecified organ involvement status     Outpatient Encounter Medications as of 02/13/2023  Medication Sig   predniSONE (DELTASONE) 20 MG tablet Take 3 tabs daily for 1 week, then 2 tabs daily for week 2, then 1 tab daily for week 3.   Cholecalciferol (VITAMIN D3) 5000 units CAPS Take 1 tablet by mouth daily.   estradiol (VIVELLE-DOT) 0.075 MG/24HR Place 1 patch onto the skin 2 (two) times a week.   Estradiol 10 MCG TABS vaginal tablet Place 1 tablet (10 mcg total) vaginally 2 (two) times a week.   famotidine (PEPCID) 20 MG tablet Take 1 tablet (20 mg total) by mouth at bedtime. (Patient taking differently: Take 20 mg by mouth as needed.)   hydrochlorothiazide (HYDRODIURIL) 12.5 MG tablet Take 12.5 mg by mouth as needed.   magnesium oxide (MAG-OX) 400 MG tablet Take 400 mg by mouth daily.   TURMERIC PO Take 1  tablet by mouth daily.   No facility-administered encounter medications on file as of 02/13/2023.    Review of Systems  Constitutional:  Positive for fever. Negative for chills.  HENT:  Negative for congestion, ear discharge and ear pain.   Eyes:  Negative for visual disturbance.  Respiratory:  Negative for chest tightness and shortness of breath.   Cardiovascular:  Positive for leg swelling. Negative for chest pain.  Genitourinary:  Negative for difficulty urinating and dysuria.  Musculoskeletal:  Positive for arthralgias, joint swelling and myalgias. Negative for back pain and gait problem.  Skin:  Negative for rash.  Neurological:  Negative for light-headedness and headaches.  Psychiatric/Behavioral:  Negative for agitation and behavioral problems.   All other systems reviewed and are negative.   Observations/Objective: Patient sounds comfortable and in no acute distress  Assessment and Plan: Problem List Items Addressed This Visit       Other   Systemic lupus erythematosus - Primary   Relevant Medications   predniSONE (DELTASONE) 20 MG tablet    Patient having a flareup like she normally gets with lupus, she wants some prednisone which she has had before which helps her.  Will do tapering dose and should follow-up with PCP for blood work. Follow up plan: Return if symptoms worsen or fail to improve.     I discussed the assessment and treatment plan with the patient. The patient was provided an opportunity to ask questions and  all were answered. The patient agreed with the plan and demonstrated an understanding of the instructions.   The patient was advised to call back or seek an in-person evaluation if the symptoms worsen or if the condition fails to improve as anticipated.  The above assessment and management plan was discussed with the patient. The patient verbalized understanding of and has agreed to the management plan. Patient is aware to call the clinic if symptoms  persist or worsen. Patient is aware when to return to the clinic for a follow-up visit. Patient educated on when it is appropriate to go to the emergency department.    I provided 7 minutes of non-face-to-face time during this encounter.    Nils Pyle, MD

## 2023-05-11 ENCOUNTER — Encounter: Payer: Self-pay | Admitting: Family

## 2023-05-11 ENCOUNTER — Telehealth: Payer: 59 | Admitting: Family

## 2023-05-11 DIAGNOSIS — K5900 Constipation, unspecified: Secondary | ICD-10-CM | POA: Diagnosis not present

## 2023-05-11 DIAGNOSIS — R109 Unspecified abdominal pain: Secondary | ICD-10-CM | POA: Diagnosis not present

## 2023-05-11 DIAGNOSIS — R140 Abdominal distension (gaseous): Secondary | ICD-10-CM | POA: Diagnosis not present

## 2023-05-11 NOTE — Progress Notes (Signed)
Virtual Visit Consent   Maureen [name], you are scheduled for a virtual visit with a [facility] provider today. Just as with appointments in the office, your consent must be obtained to participate. Your consent will be active for this visit and any virtual visit you may have with one of our providers in the next 365 days. If you have a MyChart account, a copy of this consent can be sent to you electronically.  As this is a virtual visit, video technology does not allow for your provider to perform a traditional examination. This may limit your provider's ability to fully assess your condition. If your provider identifies any concerns that need to be evaluated in person or the need to arrange testing (such as labs, EKG, etc.), we will make arrangements to do so. Although advances in technology are sophisticated, we cannot ensure that it will always work on either your end or our end. If the connection with a video visit is poor, the visit may have to be switched to a telephone visit. With either a video or telephone visit, we are not always able to ensure that we have a secure connection.  By engaging in this virtual visit, you consent to the provision of healthcare and authorize for your insurance to be billed (if applicable) for the services provided during this visit. Depending on your insurance coverage, you may receive a charge related to this service.  I need to obtain your verbal consent now. Are you willing to proceed with your visit today? Maureen [name] has provided verbal consent on 05/11/2023 for a virtual visit (video or telephone). Maureen Rodney, FNP  Date: 05/11/2023 12:31 PM  Virtual Visit via Video Note   I, Maureen [name], connected with  Maureen [name]  ([id number], 04-Jan-1966) on 05/11/23 at  4:30 PM EDT by a video-enabled telemedicine application and verified that I am speaking with the correct person using two identifiers.  Location: Patient: Virtual Visit Location Patient:  Other: work Provider: Pharmacist, community: Office/Clinic   I discussed the limitations of evaluation and management by telemedicine and the availability of in person appointments. The patient expressed understanding and agreed to proceed.    History of Present Illness: Maureen [name] is a 57 y.o. who identifies as a female who was assigned female at birth, and is being seen today for referral to GI. She reports she has been having abdominal pain on and off.  Last colonoscopy was 9 years ago. She reports pressure in her anus. Has constipation.   HPI: HPI  Problems:  Patient Active Problem List   Diagnosis Date Noted   Primary osteoarthritis 07/19/2022   Bipolar 1 disorder, manic, mild (HCC) 12/28/2021   Aortic atherosclerosis (HCC) 02/08/2021   Vision changes 05/28/2020   Paresthesia 05/28/2020   Cervical spondylosis with radiculopathy 07/01/2016   Spondylolisthesis 07/01/2016   Greater trochanteric bursitis of right hip 08/20/2015   Chronic interstitial cystitis 11/25/2014   H/O Spinal surgery 06/24/2014   Fibrositis 01/29/2014   Fibromyalgia 01/29/2014   Lumbosacral radiculitis 10/09/2013   Lumbar post-laminectomy syndrome 09/17/2013   Atypical chest pain 09/02/2013   Other long term (current) drug therapy 05/01/2013   Arthritis 04/16/2013   Abdominal pain, epigastric 03/26/2013   Back pain, chronic 12/05/2012   Connective tissue disease, undifferentiated (HCC) 12/05/2012   Raynaud's syndrome without gangrene 12/05/2012   Systemic lupus erythematosus (HCC) 12/05/2012   Raynaud's phenomenon 12/05/2012   Cerebrospinal fluid leak from spinal puncture 08/24/2012  UNSPECIFIED DISORDER OF STOMACH AND DUODENUM 08/26/2008    Allergies:  Allergies  Allergen Reactions   Gabapentin Other (See Comments)    Made paranoia   Amoxicillin Rash   Penicillins Rash    Has patient had a PCN reaction causing immediate rash, facial/tongue/throat swelling, SOB or lightheadedness  with hypotension: yes Has patient had a PCN reaction causing severe rash involving mucus membranes or skin necrosis: yes pt did experience rash but it was not severe  Has patient had a PCN reaction that required hospitalization: no Has patient had a PCN reaction occurring within the last 10 years: no If all of the above answers are "NO", then may proceed with Cephalosporin use.    Tegretol [Carbamazepine] Rash   Medications:  Current Outpatient Medications:    Cholecalciferol (VITAMIN D3) 5000 units CAPS, Take 1 tablet by mouth daily., Disp: , Rfl:    estradiol (VIVELLE-DOT) 0.075 MG/24HR, Place 1 patch onto the skin 2 (two) times a week., Disp: 24 patch, Rfl: 3   Estradiol 10 MCG TABS vaginal tablet, Place 1 tablet (10 mcg total) vaginally 2 (two) times a week., Disp: 24 tablet, Rfl: 3   famotidine (PEPCID) 20 MG tablet, Take 1 tablet (20 mg total) by mouth at bedtime. (Patient taking differently: Take 20 mg by mouth as needed.), Disp: 30 tablet, Rfl: 3   hydrochlorothiazide (HYDRODIURIL) 12.5 MG tablet, Take 12.5 mg by mouth as needed., Disp: , Rfl:    magnesium oxide (MAG-OX) 400 MG tablet, Take 400 mg by mouth daily., Disp: , Rfl:    predniSONE (DELTASONE) 20 MG tablet, Take 3 tabs daily for 1 week, then 2 tabs daily for week 2, then 1 tab daily for week 3., Disp: 42 tablet, Rfl: 0   TURMERIC PO, Take 1 tablet by mouth daily., Disp: , Rfl:   Observations/Objective: Patient is well-developed, well-nourished in no acute distress.  Resting comfortably Head is normocephalic, atraumatic.  No labored breathing. Speech is clear and coherent with logical content.  Patient is alert and oriented at baseline.   Assessment and Plan: 1. Abdominal pain, unspecified abdominal location - Ambulatory referral to Gastroenterology  2. Bloating - Ambulatory referral to Gastroenterology  3. Constipation, unspecified constipation type - Ambulatory referral to Gastroenterology  Referral to GI  pending  Force fluids Healthy diet Follow up as needed, keep chronic follow   Follow Up Instructions: I discussed the assessment and treatment plan with the patient. The patient was provided an opportunity to ask questions and all were answered. The patient agreed with the plan and demonstrated an understanding of the instructions.  A copy of instructions were sent to the patient via MyChart unless otherwise noted below.     The patient was advised to call back or seek an in-person evaluation if the symptoms worsen or if the condition fails to improve as anticipated.  Time:  I spent 8 minutes with the patient via telehealth technology discussing the above problems/concerns.    Maureen Rodney, FNP

## 2023-05-20 ENCOUNTER — Other Ambulatory Visit: Payer: Self-pay | Admitting: Gastroenterology

## 2023-05-22 NOTE — Telephone Encounter (Signed)
Good morning Dr Russella Dar,   Please advise if OK to refill as you are DOD am.  This is a patient of Dr Christella Hartigan,   Thanks

## 2023-06-09 ENCOUNTER — Encounter: Payer: Self-pay | Admitting: Family

## 2023-06-09 ENCOUNTER — Ambulatory Visit (INDEPENDENT_AMBULATORY_CARE_PROVIDER_SITE_OTHER): Payer: 59 | Admitting: Family

## 2023-06-09 VITALS — BP 105/69 | HR 60 | Temp 98.0°F | Ht 63.0 in | Wt 144.0 lb

## 2023-06-09 DIAGNOSIS — M329 Systemic lupus erythematosus, unspecified: Secondary | ICD-10-CM

## 2023-06-09 DIAGNOSIS — Z1159 Encounter for screening for other viral diseases: Secondary | ICD-10-CM

## 2023-06-09 DIAGNOSIS — I700 Atherosclerosis of aorta: Secondary | ICD-10-CM

## 2023-06-09 DIAGNOSIS — M545 Low back pain, unspecified: Secondary | ICD-10-CM

## 2023-06-09 DIAGNOSIS — Z0001 Encounter for general adult medical examination with abnormal findings: Secondary | ICD-10-CM | POA: Diagnosis not present

## 2023-06-09 DIAGNOSIS — M150 Primary generalized (osteo)arthritis: Secondary | ICD-10-CM

## 2023-06-09 DIAGNOSIS — G8929 Other chronic pain: Secondary | ICD-10-CM

## 2023-06-09 DIAGNOSIS — Z0000 Encounter for general adult medical examination without abnormal findings: Secondary | ICD-10-CM

## 2023-06-09 DIAGNOSIS — M159 Polyosteoarthritis, unspecified: Secondary | ICD-10-CM | POA: Diagnosis not present

## 2023-06-09 DIAGNOSIS — M961 Postlaminectomy syndrome, not elsewhere classified: Secondary | ICD-10-CM

## 2023-06-09 MED ORDER — BACLOFEN 10 MG PO TABS: 10.0000 mg | ORAL_TABLET | Freq: Three times a day (TID) | ORAL | 2 refills | Status: AC

## 2023-06-09 NOTE — Patient Instructions (Signed)

## 2023-06-09 NOTE — Progress Notes (Signed)
Subjective:    Patient ID: Maureen [name], female    DOB: 1966/03/05, 57 y.o.   MRN: [id number]  Chief Complaint  Patient presents with   Annual Exam    Patient wants baclofen sent in today for her back.   Pt presents to the office today for CPE without pap. She is followed by Gyn annually.   She has lupus and has seen Rheumatologists in the past.   She has aortic atherosclerosis, but does not take statin.  Arthritis Presents for follow-up visit. She complains of pain and stiffness. Affected locations include the right knee, left knee, left MCP, right MCP, left hip and right hip. Her pain is at a severity of 7/10.  Back Pain This is a chronic problem. The current episode started more than 1 year ago. The problem occurs intermittently. The problem has been waxing and waning since onset. The pain is present in the lumbar spine. The quality of the pain is described as aching. The pain is at a severity of 7/10. The pain is moderate. She has tried home exercises and muscle relaxant for the symptoms. The treatment provided mild relief.      Review of Systems  Musculoskeletal:  Positive for arthritis, back pain and stiffness.  All other systems reviewed and are negative.      Objective:   Physical Exam Vitals reviewed.  Constitutional:      General: She is not in acute distress.    Appearance: She is well-developed.  HENT:     Head: Normocephalic and atraumatic.     Right Ear: Tympanic membrane normal.     Left Ear: Tympanic membrane normal.  Eyes:     Pupils: Pupils are equal, round, and reactive to light.  Neck:     Thyroid: No thyromegaly.  Cardiovascular:     Rate and Rhythm: Normal rate and regular rhythm.     Heart sounds: Normal heart sounds. No murmur heard. Pulmonary:     Effort: Pulmonary effort is normal. No respiratory distress.     Breath sounds: Normal breath sounds. No wheezing.  Abdominal:     General: Bowel sounds are normal. There is no distension.      Palpations: Abdomen is soft.     Tenderness: There is no abdominal tenderness.  Musculoskeletal:        General: No tenderness. Normal range of motion.     Cervical back: Normal range of motion and neck supple.     Comments: Full ROM, pain in lumbar with flexion  Skin:    General: Skin is warm and dry.  Neurological:     Mental Status: She is alert and oriented to person, place, and time.     Cranial Nerves: No cranial nerve deficit.     Deep Tendon Reflexes: Reflexes are normal and symmetric.  Psychiatric:        Behavior: Behavior normal.        Thought Content: Thought content normal.        Judgment: Judgment normal.          BP 105/69   Pulse 60   Temp 98 F (36.7 C) (Temporal)   Ht 5\' 3"  (1.6 m)   Wt 144 lb (65.3 kg)   SpO2 95%   BMI 25.51 kg/m   Assessment & Plan:   MALIHA SADO comes in today with chief complaint of Annual Exam (Patient wants baclofen sent in today for her back.)   Diagnosis and orders addressed:  1. Annual physical exam - CBC with Differential/Platelet; Future - CMP14+EGFR; Future - Lipid panel; Future - TSH; Future - Hepatitis C antibody; Future  2. Aortic atherosclerosis (HCC) - CBC with Differential/Platelet; Future - CMP14+EGFR; Future  3. Primary osteoarthritis involving multiple joints - CBC with Differential/Platelet; Future - CMP14+EGFR; Future  4. Systemic lupus erythematosus, unspecified SLE type, unspecified organ involvement status (HCC) - CBC with Differential/Platelet; Future - CMP14+EGFR; Future  5. Lumbar post-laminectomy syndrome - baclofen (LIORESAL) 10 MG tablet; Take 1 tablet (10 mg total) by mouth 3 (three) times daily.  Dispense: 60 each; Refill: 2 - CBC with Differential/Platelet; Future - CMP14+EGFR; Future  6. Chronic bilateral low back pain without sciatica  - CBC with Differential/Platelet; Future - CMP14+EGFR; Future  7. Need for hepatitis C screening test - Hepatitis C antibody;  Future   Labs pending Health Maintenance reviewed Diet and exercise encouraged  Follow up plan: 6 months    Jannifer Rodney, FNP

## 2023-06-16 ENCOUNTER — Other Ambulatory Visit: Payer: 59

## 2023-06-16 DIAGNOSIS — Z1159 Encounter for screening for other viral diseases: Secondary | ICD-10-CM

## 2023-06-16 DIAGNOSIS — M159 Polyosteoarthritis, unspecified: Secondary | ICD-10-CM

## 2023-06-16 DIAGNOSIS — I700 Atherosclerosis of aorta: Secondary | ICD-10-CM

## 2023-06-16 DIAGNOSIS — M545 Low back pain, unspecified: Secondary | ICD-10-CM

## 2023-06-16 DIAGNOSIS — M329 Systemic lupus erythematosus, unspecified: Secondary | ICD-10-CM

## 2023-06-16 DIAGNOSIS — Z0000 Encounter for general adult medical examination without abnormal findings: Secondary | ICD-10-CM

## 2023-06-16 DIAGNOSIS — M961 Postlaminectomy syndrome, not elsewhere classified: Secondary | ICD-10-CM

## 2023-06-17 LAB — CBC WITH DIFFERENTIAL/PLATELET
Basophils Absolute: 0 10*3/uL (ref 0.0–0.2)
Basos: 1 %
EOS (ABSOLUTE): 0 10*3/uL (ref 0.0–0.4)
Eos: 1 %
Hematocrit: 39.4 % (ref 34.0–46.6)
Hemoglobin: 13.5 g/dL (ref 11.1–15.9)
Immature Grans (Abs): 0 10*3/uL (ref 0.0–0.1)
Immature Granulocytes: 0 %
Lymphocytes Absolute: 1.5 10*3/uL (ref 0.7–3.1)
Lymphs: 43 %
MCH: 32.1 pg (ref 26.6–33.0)
MCHC: 34.3 g/dL (ref 31.5–35.7)
MCV: 94 fL (ref 79–97)
Monocytes Absolute: 0.3 10*3/uL (ref 0.1–0.9)
Monocytes: 9 %
Neutrophils Absolute: 1.6 10*3/uL (ref 1.4–7.0)
Neutrophils: 46 %
Platelets: 255 10*3/uL (ref 150–450)
RBC: 4.2 x10E6/uL (ref 3.77–5.28)
RDW: 12.8 % (ref 11.7–15.4)
WBC: 3.4 10*3/uL (ref 3.4–10.8)

## 2023-06-17 LAB — LIPID PANEL
Chol/HDL Ratio: 2 ratio (ref 0.0–4.4)
Cholesterol, Total: 200 mg/dL — ABNORMAL HIGH (ref 100–199)
HDL: 98 mg/dL (ref >39)
LDL Chol Calc (NIH): 88 mg/dL (ref 0–99)
Triglycerides: 78 mg/dL (ref 0–149)
VLDL Cholesterol Cal: 14 mg/dL (ref 5–40)

## 2023-06-17 LAB — CMP14+EGFR
ALT: 16 IU/L (ref 0–32)
AST: 18 IU/L (ref 0–40)
Albumin: 4.3 g/dL (ref 3.8–4.9)
Alkaline Phosphatase: 48 IU/L (ref 44–121)
BUN/Creatinine Ratio: 15 (ref 9–23)
BUN: 12 mg/dL (ref 6–24)
Bilirubin Total: 0.3 mg/dL (ref 0.0–1.2)
CO2: 27 mmol/L (ref 20–29)
Calcium: 9.2 mg/dL (ref 8.7–10.2)
Chloride: 100 mmol/L (ref 96–106)
Creatinine, Ser: 0.78 mg/dL (ref 0.57–1.00)
Globulin, Total: 2.1 g/dL (ref 1.5–4.5)
Glucose: 82 mg/dL (ref 70–99)
Potassium: 3.9 mmol/L (ref 3.5–5.2)
Sodium: 139 mmol/L (ref 134–144)
Total Protein: 6.4 g/dL (ref 6.0–8.5)
eGFR: 89 mL/min/{1.73_m2} (ref >59)

## 2023-06-17 LAB — HEPATITIS C ANTIBODY: Hep C Virus Ab: NONREACTIVE

## 2023-06-17 LAB — TSH: TSH: 1.48 u[IU]/mL (ref 0.450–4.500)

## 2023-06-29 ENCOUNTER — Ambulatory Visit: Payer: 59 | Admitting: Nurse Practitioner

## 2023-06-29 ENCOUNTER — Encounter: Payer: Self-pay | Admitting: Nurse Practitioner

## 2023-06-29 VITALS — BP 107/73 | HR 61 | Temp 97.7°F | Resp 20 | Ht 63.0 in | Wt 144.0 lb

## 2023-06-29 DIAGNOSIS — R300 Dysuria: Secondary | ICD-10-CM | POA: Diagnosis not present

## 2023-06-29 LAB — URINALYSIS, COMPLETE
Bilirubin, UA: NEGATIVE
Glucose, UA: NEGATIVE
Ketones, UA: NEGATIVE
Leukocytes,UA: NEGATIVE
Nitrite, UA: NEGATIVE
Protein,UA: NEGATIVE
RBC, UA: NEGATIVE
Specific Gravity, UA: 1.01 (ref 1.005–1.030)
Urobilinogen, Ur: 0.2 mg/dL (ref 0.2–1.0)
pH, UA: 7 (ref 5.0–7.5)

## 2023-06-29 LAB — MICROSCOPIC EXAMINATION
Epithelial Cells (non renal): NONE SEEN /HPF (ref 0–10)
RBC, Urine: NONE SEEN /HPF (ref 0–2)
Renal Epithel, UA: NONE SEEN /HPF
Yeast, UA: NONE SEEN

## 2023-06-29 NOTE — Progress Notes (Signed)
   Subjective:    Patient ID: Maureen [name], female    DOB: May 10, 1966, 57 y.o.   MRN: [id number]   Chief Complaint: Dysuria   Dysuria  This is a new problem. The current episode started 1 to 4 weeks ago. The problem occurs intermittently. The problem has been waxing and waning. The quality of the pain is described as burning. The pain is at a severity of 8/10. The pain is mild. There has been no fever. She is Sexually active. There is No history of pyelonephritis. Associated symptoms include frequency, hesitancy and urgency. She has tried nothing for the symptoms. The treatment provided mild relief.    Patient Active Problem List   Diagnosis Date Noted   Primary osteoarthritis 07/19/2022   Bipolar 1 disorder, manic, mild (HCC) 12/28/2021   Aortic atherosclerosis (HCC) 02/08/2021   Vision changes 05/28/2020   Paresthesia 05/28/2020   Cervical spondylosis with radiculopathy 07/01/2016   Spondylolisthesis 07/01/2016   Greater trochanteric bursitis of right hip 08/20/2015   Chronic interstitial cystitis 11/25/2014   H/O Spinal surgery 06/24/2014   Fibrositis 01/29/2014   Fibromyalgia 01/29/2014   Lumbosacral radiculitis 10/09/2013   Lumbar post-laminectomy syndrome 09/17/2013   Atypical chest pain 09/02/2013   Other long term (current) drug therapy 05/01/2013   Arthritis 04/16/2013   Abdominal pain, epigastric 03/26/2013   Back pain, chronic 12/05/2012   Connective tissue disease, undifferentiated (HCC) 12/05/2012   Raynaud's syndrome without gangrene 12/05/2012   Systemic lupus erythematosus (HCC) 12/05/2012   Raynaud's phenomenon 12/05/2012   Cerebrospinal fluid leak from spinal puncture 08/24/2012   UNSPECIFIED DISORDER OF STOMACH AND DUODENUM 08/26/2008       Review of Systems  Genitourinary:  Positive for dysuria, frequency, hesitancy and urgency.       Objective:   Physical Exam Vitals reviewed.  Constitutional:      Appearance: Normal appearance.   Cardiovascular:     Rate and Rhythm: Normal rate and regular rhythm.  Pulmonary:     Effort: Pulmonary effort is normal.     Breath sounds: Normal breath sounds.  Skin:    General: Skin is warm.  Neurological:     General: No focal deficit present.     Mental Status: She is alert and oriented to person, place, and time.  Psychiatric:        Mood and Affect: Mood normal.        Behavior: Behavior normal.     BP 107/73   Pulse 61   Temp 97.7 F (36.5 C) (Temporal)   Resp 20   Ht 5\' 3"  (1.6 m)   Wt 144 lb (65.3 kg)   SpO2 100%   BMI 25.51 kg/m        Assessment & Plan:  Maureen [name] in today with chief complaint of Dysuria   1. Dysuria Probably interstitual cystitis Force fluids AZO if needed - Urinalysis, Complete - Urine Culture    The above assessment and management plan was discussed with the patient. The patient verbalized understanding of and has agreed to the management plan. Patient is aware to call the clinic if symptoms persist or worsen. Patient is aware when to return to the clinic for a follow-up visit. Patient educated on when it is appropriate to go to the emergency department.   Mary-Margaret Daphine Deutscher, FNP

## 2023-06-30 LAB — URINE CULTURE

## 2023-07-17 ENCOUNTER — Other Ambulatory Visit: Payer: Self-pay | Admitting: Family

## 2023-07-17 DIAGNOSIS — Z0000 Encounter for general adult medical examination without abnormal findings: Secondary | ICD-10-CM

## 2023-08-21 ENCOUNTER — Ambulatory Visit: Payer: 59

## 2023-08-24 ENCOUNTER — Ambulatory Visit
Admission: RE | Admit: 2023-08-24 | Discharge: 2023-08-24 | Disposition: A | Discharge status: Home / Self Care | Payer: 59 | Source: Ambulatory Visit | Attending: Family | Admitting: Family

## 2023-08-24 DIAGNOSIS — Z0000 Encounter for general adult medical examination without abnormal findings: Secondary | ICD-10-CM

## 2023-08-30 ENCOUNTER — Ambulatory Visit: Payer: Self-pay | Admitting: Family

## 2023-08-30 NOTE — Telephone Encounter (Signed)
  Chief Complaint: Swelling on R side of neck  Symptoms: increase in pain Pertinent Negatives: Patient denies SOB, denies Swallowing problems, denies fever Disposition: [x] ED /[] Urgent Care (no appt availability in office) / [x] Appointment(In office/virtual)/ []  [facility]/ [] Home Care/ [] Refused Recommended Disposition /[] [facility]/ []  Follow-up with PCP Additional Notes: Pt hx of Lupus, refusing to go to ED, scheduled appt for 11/7, advised to go to ED should she develop SOB, problems swallowing secretions. Pt states she will do that if necessary, but cannot afford to go to ED at this time.    Answer Assessment - Initial Assessment Questions 1. ONSET: "When did the pain begin?"      Couple weeks 2. LOCATION: "Where does it hurt?"      Neck R side 3. PATTERN "Does the pain come and go, or has it been constant since it started?"      Increasing over 2 weeks, chronic 4. SEVERITY: "How bad is the pain?"  (Scale 1-10; or mild, moderate, severe)   - NO PAIN (0): no pain or only slight stiffness    - MILD (1-3): doesn't interfere with normal activities    - MODERATE (4-7): interferes with normal activities or awakens from sleep    - SEVERE (8-10):  excruciating pain, unable to do any normal activities      4 5. RADIATION: "Does the pain go anywhere else, shoot into your arms?"     Jaw, no tooth pain  6. CORD SYMPTOMS: "Any weakness or numbness of the arms or legs?"     Not more than normal 7. CAUSE: "What do you think is causing the neck pain?"     Denies injury  9. OTHER SYMPTOMS: "Do you have any other symptoms?" (e.g., headache, fever, chest pain, difficulty breathing, neck swelling)     Denies SOB, + malaise, hx of lupus  Protocols used: Neck Pain or Stiffness-A-AH

## 2023-08-31 ENCOUNTER — Ambulatory Visit: Payer: 59 | Admitting: Family Medicine

## 2023-08-31 VITALS — BP 111/64 | HR 67 | Temp 98.3°F | Ht 63.0 in | Wt 144.0 lb

## 2023-08-31 DIAGNOSIS — R55 Syncope and collapse: Secondary | ICD-10-CM

## 2023-08-31 DIAGNOSIS — M542 Cervicalgia: Secondary | ICD-10-CM | POA: Diagnosis not present

## 2023-08-31 DIAGNOSIS — M25542 Pain in joints of left hand: Secondary | ICD-10-CM

## 2023-08-31 DIAGNOSIS — Z1331 Encounter for screening for depression: Secondary | ICD-10-CM

## 2023-08-31 DIAGNOSIS — M329 Systemic lupus erythematosus, unspecified: Secondary | ICD-10-CM

## 2023-08-31 DIAGNOSIS — M25541 Pain in joints of right hand: Secondary | ICD-10-CM

## 2023-08-31 MED ORDER — PREDNISONE 20 MG PO TABS
20.0000 mg | ORAL_TABLET | Freq: Every day | ORAL | 0 refills | Status: DC
Start: 2023-08-31 — End: 2023-09-26

## 2023-08-31 NOTE — Progress Notes (Signed)
Subjective:  Patient ID: Maureen [name], female    DOB: 1966/05/21, 57 y.o.   MRN: [id number]  Patient Care Team: Junie Spencer, FNP as PCP - General (Family Medicine) Maisie Fus, MD as PCP - Cardiology (Cardiology) Romualdo Bolk, MD (Inactive) as Consulting Physician (Obstetrics and Gynecology)   Chief Complaint:  neck swelling (Right side)   HPI: Maureen [name] is a 57 y.o. female presenting on 08/31/2023 for neck swelling (Right side) States that she is doing "bad" with lupus and having multiple flare ups this year. She was seen in February and April of this year for flares. She was treated with prednisone both times. She states that the first time was not a high enough dose. The second taper worked will. She reports that yesterday she noticed the right side of neck & lymph nodes swollen. She also reports increased neck pain that radiates up her head and shoulder. She has not tried anything OTC for it. States that the swelling has gone down today but it "does not feel right". In addition, reports that her bilateral hands feel swollen and painful intermittently.   She reports that she used to see rheumatology. She did not like her previous rheumatologist. In addition, she does not tolerate plaquenil.   Pre-syncope  States that she sometimes she feels that she is going to pass out while hiking. States it is happening once a week. Endorses change to vision with white spots and cloudiness, and palpitations. She does not know if it is related to her heart or anxiety. States that her BP gets low during these episodes, she checks it when she comes home.   Relevant past medical, surgical, family, and social history reviewed and updated as indicated.  Allergies and medications reviewed and updated. Data reviewed: Chart in Epic.  Past Medical History:  Diagnosis Date   Abdominal pain    Allergy    Anxiety    takes Xanax daily as needed   Arthritis    Cancer (HCC)    MOLE PRE    Cerebrospinal fluid leak from spinal puncture 08/24/2012   Chronic back pain    Constipation    takes Dulcolax and Miralax daily as needed;also Fiber Pills   Depression    takes Lamictal daily   Fibromyalgia    GERD (gastroesophageal reflux disease)    takes Omeprazole daily   Headache(784.0)    Heart murmur    slight   History of bronchitis    last time many yrs ago(62yrs ago)   Internal hemorrhoid    Interstitial cystitis    no problems since beginning of Jan 2014   Joint pain    Joint swelling    Lupus    takes Plaquenil daily   Muscle spasm    takes Robaxin daily as needed   Numbness and tingling in hands    Osteoarthritis    Pneumonia    hx of > 71yrs ago   Raynaud's disease    Urinary frequency    Urinary urgency     Past Surgical History:  Procedure Laterality Date   ABDOMINAL HERNIA REPAIR     APPENDECTOMY     CESAREAN SECTION     CHOLECYSTECTOMY     COLONOSCOPY     DIAGNOSTIC LAPAROSCOPY     adhesions   ENDOMETRIAL ABLATION     ESOPHAGOGASTRODUODENOSCOPY N/A 05/08/2013   Procedure: ESOPHAGOGASTRODUODENOSCOPY (EGD);  Surgeon: Malissa Hippo, MD;  Location: [facility];  Service:  Endoscopy;  Laterality: N/A;  315   NASAL SEPTUM SURGERY     OVARIAN CYST REMOVAL     SHOULDER SURGERY Right    x 2   SPINAL CORD STIMULATOR INSERTION N/A 06/13/2014   Procedure: LUMBAR SPINAL CORD STIMULATOR INSERTION;  Surgeon: Gwynne Edinger, MD;  Location: [facility];  Service: Neurosurgery;  Laterality: N/A;   SPINAL FUSION     x 2    TONSILLECTOMY     TOTAL VAGINAL HYSTERECTOMY  05/2011   TUBAL LIGATION     uterine ablation      Social History   Socioeconomic History   Marital status: Legally Separated    Spouse name: Not on file   Number of children: Not on file   Years of education: Not on file   Highest education level: Associate degree: academic program  Occupational History   Not on file  Tobacco Use   Smoking status: Former    Current packs/day:  0.00    Average packs/day: 0.5 packs/day for 20.0 years (10.0 ttl pk-yrs)    Types: Cigarettes    Start date: 06/09/1988    Quit date: 06/09/2008    Years since quitting: 15.2   Smokeless tobacco: Never  Vaping Use   Vaping status: Never Used  Substance and Sexual Activity   Alcohol use: Yes    Comment: occassionaly   Drug use: No   Sexual activity: Yes    Partners: Male    Birth control/protection: Surgical  Other Topics Concern   Not on file  Social History Narrative   Not on file   Social Determinants of Health   Financial Resource Strain: Medium Risk (08/31/2023)   Overall Financial Resource Strain (CARDIA)    Difficulty of Paying Living Expenses: Somewhat hard  Food Insecurity: No Food Insecurity (08/31/2023)   Hunger Vital Sign    Worried About Running Out of Food in the Last Year: Never true    Ran Out of Food in the Last Year: Never true  Transportation Needs: No Transportation Needs (08/31/2023)   PRAPARE - Administrator, Civil Service (Medical): No    Lack of Transportation (Non-Medical): No  Physical Activity: Sufficiently Active (08/31/2023)   Exercise Vital Sign    Days of Exercise per Week: 5 days    Minutes of Exercise per Session: 50 min  Stress: Stress Concern Present (08/31/2023)   [employer] of Occupational Health - Occupational Stress Questionnaire    Feeling of Stress : To some extent  Social Connections: Moderately Isolated (08/31/2023)   Social Connection and Isolation Panel [NHANES]    Frequency of Communication with Friends and Family: More than three times a week    Frequency of Social Gatherings with Friends and Family: Twice a week    Attends Religious Services: More than 4 times per year    Active Member of [employer] or Organizations: No    Attends Banker Meetings: Not on file    Marital Status: Divorced  Intimate Partner Violence: Not on file    Outpatient Encounter Medications as of 08/31/2023  Medication Sig    baclofen (LIORESAL) 10 MG tablet Take 1 tablet (10 mg total) by mouth 3 (three) times daily.   Cholecalciferol (VITAMIN D3) 5000 units CAPS Take 1 tablet by mouth daily.   estradiol (VIVELLE-DOT) 0.075 MG/24HR Place 1 patch onto the skin 2 (two) times a week.   Estradiol 10 MCG TABS vaginal tablet Place 1 tablet (10 mcg total) vaginally 2 (  two) times a week.   famotidine (PEPCID) 20 MG tablet TAKE 1 TABLET BY MOUTH EVERYDAY AT BEDTIME   hydrochlorothiazide (HYDRODIURIL) 12.5 MG tablet Take 12.5 mg by mouth as needed.   magnesium oxide (MAG-OX) 400 MG tablet Take 400 mg by mouth daily.   TURMERIC PO Take 1 tablet by mouth daily.   No facility-administered encounter medications on file as of 08/31/2023.    Allergies  Allergen Reactions   Gabapentin Other (See Comments)    Made paranoia   Amoxicillin Rash   Penicillins Rash    Has patient had a PCN reaction causing immediate rash, facial/tongue/throat swelling, SOB or lightheadedness with hypotension: yes Has patient had a PCN reaction causing severe rash involving mucus membranes or skin necrosis: yes pt did experience rash but it was not severe  Has patient had a PCN reaction that required hospitalization: no Has patient had a PCN reaction occurring within the last 10 years: no If all of the above answers are "NO", then may proceed with Cephalosporin use.    Tegretol [Carbamazepine] Rash    Review of Systems As per HPI  Objective:  BP 111/64   Pulse 67   Temp 98.3 F (36.8 C)   Ht 5\' 3"  (1.6 m)   Wt 144 lb (65.3 kg)   SpO2 100%   BMI 25.51 kg/m    Wt Readings from Last 3 Encounters:  08/31/23 144 lb (65.3 kg)  06/29/23 144 lb (65.3 kg)  06/09/23 144 lb (65.3 kg)   Physical Exam Constitutional:      General: She is awake. She is not in acute distress.    Appearance: Normal appearance. She is well-developed and well-groomed. She is not ill-appearing, toxic-appearing or diaphoretic.  Cardiovascular:     Rate and Rhythm:  Normal rate.     Pulses: Normal pulses.          Radial pulses are 2+ on the right side and 2+ on the left side.       Posterior tibial pulses are 2+ on the right side and 2+ on the left side.     Heart sounds: Normal heart sounds. No murmur heard.    No gallop.  Pulmonary:     Effort: Pulmonary effort is normal. No respiratory distress.     Breath sounds: Normal breath sounds. No stridor. No wheezing, rhonchi or rales.  Musculoskeletal:     Cervical back: Full passive range of motion without pain and neck supple.     Right lower leg: No edema.     Left lower leg: No edema.  Lymphadenopathy:     Head:     Right side of head: No submental, submandibular, tonsillar, preauricular or posterior auricular adenopathy.     Left side of head: No submental, submandibular, tonsillar, preauricular or posterior auricular adenopathy.  Skin:    General: Skin is warm.     Capillary Refill: Capillary refill takes less than 2 seconds.  Neurological:     General: No focal deficit present.     Mental Status: She is alert, oriented to person, place, and time and easily aroused. Mental status is at baseline.     GCS: GCS eye subscore is 4. GCS verbal subscore is 5. GCS motor subscore is 6.     Motor: No weakness.  Psychiatric:        Attention and Perception: Attention and perception normal.        Mood and Affect: Mood and affect normal.  Speech: Speech normal.        Behavior: Behavior normal. Behavior is cooperative.        Thought Content: Thought content normal. Thought content does not include homicidal or suicidal ideation. Thought content does not include homicidal or suicidal plan.        Cognition and Memory: Cognition and memory normal.        Judgment: Judgment normal.     Results for orders placed or performed in visit on 06/29/23  Urine Culture   Specimen: Urine   UR  Result Value Ref Range   Urine Culture, Routine Final report    Organism ID, Bacteria Comment   Microscopic  Examination   Urine  Result Value Ref Range   WBC, UA 0-5 0 - 5 /hpf   RBC, Urine None seen 0 - 2 /hpf   Epithelial Cells (non renal) None seen 0 - 10 /hpf   Renal Epithel, UA None seen None seen /hpf   Bacteria, UA Few (A) None seen/Few   Yeast, UA None seen None seen  Urinalysis, Complete  Result Value Ref Range   Specific Gravity, UA 1.010 1.005 - 1.030   pH, UA 7.0 5.0 - 7.5   Color, UA Yellow Yellow   Appearance Ur Clear Clear   Leukocytes,UA Negative Negative   Protein,UA Negative Negative/Trace   Glucose, UA Negative Negative   Ketones, UA Negative Negative   RBC, UA Negative Negative   Bilirubin, UA Negative Negative   Urobilinogen, Ur 0.2 0.2 - 1.0 mg/dL   Nitrite, UA Negative Negative   Microscopic Examination See below:        06/29/2023   10:16 AM 06/09/2023   10:57 AM 07/19/2022    8:31 AM 05/17/2022    2:41 PM 04/14/2022    2:59 PM  Depression screen PHQ 2/9  Decreased Interest 1 0 0 0 1  Down, Depressed, Hopeless 0 0 1 1 1   PHQ - 2 Score 1 0 1 1 2   Altered sleeping 1 2 2 3 2   Tired, decreased energy 2 2 2 2 3   Change in appetite 1 1 1 1 1   Feeling bad or failure about yourself  0 0 0 0 0  Trouble concentrating 2 1 2 1 1   Moving slowly or fidgety/restless 1 1 0 1 0  Suicidal thoughts 0 0 0 0 0  PHQ-9 Score 8 7 8 9 9   Difficult doing work/chores Somewhat difficult Somewhat difficult Somewhat difficult Somewhat difficult Somewhat difficult       06/29/2023   10:17 AM 06/09/2023   10:58 AM 07/19/2022    8:31 AM 05/17/2022    2:42 PM  GAD 7 : Generalized Anxiety Score  Nervous, Anxious, on Edge 1 1 1 1   Control/stop worrying 1 1 1  0  Worry too much - different things 1 1 1 1   Trouble relaxing 1 1 1 1   Restless 1 1 1 1   Easily annoyed or irritable 1 1 1 1   Afraid - awful might happen 1 1 1 1   Total GAD 7 Score 7 7 7 6   Anxiety Difficulty Somewhat difficult Somewhat difficult Somewhat difficult Somewhat difficult    Pertinent labs & imaging results that  were available during my care of the patient were reviewed by me and considered in my medical decision making.  Assessment & Plan:  Jera was seen today for neck swelling.  Diagnoses and all orders for this visit:  Neck pain Will provide taper as below.  Discussed with patient that SLE is not managed by primary care and given that she has had multiple flare ups this year of joint pain and swelling, recommend that she return to rheumatology. Referral placed. Patient verbalized that she would reach out to rheumatology at [facility].  -     predniSONE (DELTASONE) 20 MG tablet; Take 1 tablet (20 mg total) by mouth daily with breakfast. Prednisone 60mg  (3 tabs) for 1 week, prednisone 40 mg (2 tabs) for 1 week, 20 mg (1 tab) for one week -     Ambulatory referral to Rheumatology  Arthralgia of both hands Will provide taper as below. Discussed with patient that SLE is not managed by primary care and given that she has had multiple flare ups this year of joint pain and swelling, recommend that she return to rheumatology. Referral placed. Patient verbalized that she would reach out to rheumatology at [facility].  -     predniSONE (DELTASONE) 20 MG tablet; Take 1 tablet (20 mg total) by mouth daily with breakfast. Prednisone 60mg  (3 tabs) for 1 week, prednisone 40 mg (2 tabs) for 1 week, 20 mg (1 tab) for one week -     Ambulatory referral to Rheumatology  Systemic lupus erythematosus, unspecified SLE type, unspecified organ involvement status (HCC) Will provide taper as below. Discussed with patient that SLE is not managed by primary care and given that she has had multiple flare ups this year of joint pain and swelling, recommend that she return to rheumatology. Referral placed. Patient verbalized that she would reach out to rheumatology at [facility].  -     predniSONE (DELTASONE) 20 MG tablet; Take 1 tablet (20 mg total) by mouth daily with breakfast. Prednisone 60mg  (3 tabs) for 1 week, prednisone 40 mg (2 tabs) for 1  week, 20 mg (1 tab) for one week -     Ambulatory referral to Rheumatology  Near syncope Discussed with patient adequate hydration and eating a snack before exercise. Patient declined Zio. Reviewed labs from September. No significant abnormalities at that time. Patient would like to wait to complete labs as below. Orders placed for her to schedule when ready. Follow up if symptoms continue.  - CBC with Differential/Platelet; Future - CMP14+EGFR; Future  Encounter for screening for depression Patient screened positive for depression and anxiety during exam today. Symptoms stable. Denies SI. Patient to follow up with PCP.   Continue all other maintenance medications.  Follow up plan: Return if symptoms worsen or fail to improve.   Continue healthy lifestyle choices, including diet (rich in fruits, vegetables, and lean proteins, and low in salt and simple carbohydrates) and exercise (at least 30 minutes of moderate physical activity daily).  Written and verbal instructions provided   The above assessment and management plan was discussed with the patient. The patient verbalized understanding of and has agreed to the management plan. Patient is aware to call the clinic if they develop any new symptoms or if symptoms persist or worsen. Patient is aware when to return to the clinic for a follow-up visit. Patient educated on when it is appropriate to go to the emergency department.   Neale Burly, DNP-FNP [facility] [address] [place], [place] [address] [phone]

## 2023-09-25 ENCOUNTER — Other Ambulatory Visit: Payer: Self-pay | Admitting: Orthopedic Surgery

## 2023-09-25 DIAGNOSIS — M4312 Spondylolisthesis, cervical region: Secondary | ICD-10-CM

## 2023-09-26 ENCOUNTER — Ambulatory Visit: Payer: 59 | Admitting: Obstetrics and Gynecology

## 2023-09-26 ENCOUNTER — Encounter: Payer: Self-pay | Admitting: Obstetrics and Gynecology

## 2023-09-26 VITALS — Wt 144.0 lb

## 2023-09-26 DIAGNOSIS — R300 Dysuria: Secondary | ICD-10-CM

## 2023-09-26 DIAGNOSIS — Z113 Encounter for screening for infections with a predominantly sexual mode of transmission: Secondary | ICD-10-CM | POA: Diagnosis not present

## 2023-09-26 NOTE — Addendum Note (Signed)
Addended by: Tonita Phoenix E on: 09/26/2023 12:20 PM   Modules accepted: Orders

## 2023-09-26 NOTE — Progress Notes (Signed)
57 y.o. G72P1103 female with SLE, PBS, on HRT here for STD screening. She was dating a partner for 3 years who she recently found out was unfaithful. They stopped dating in September and she wants an STD check.  She reports chronic PBS sx, no change. However, she notes lip swelling and lesions that started recently. They are improved today. She is unsure if its related to medications, lupus flare, or possible STD.  No LMP recorded. Patient has had a hysterectomy.   Sexually active: yes with previous relationship   GYN HISTORY: Prior hysterectomy  OB History  Gravida Para Term Preterm AB Living  2 2 1 1   3   SAB IAB Ectopic Multiple Live Births        1 3    # Outcome Date GA Lbr Len/2nd Weight Sex Type Anes PTL Lv  2 Term      CS-Unspec   LIV  1A Preterm      CS-Unspec   LIV  1B Preterm      CS-Unspec   LIV    Past Medical History:  Diagnosis Date   Abdominal pain    Allergy    Anxiety    takes Xanax daily as needed   Arthritis    Cancer (HCC)    MOLE PRE   Cerebrospinal fluid leak from spinal puncture 08/24/2012   Chronic back pain    Constipation    takes Dulcolax and Miralax daily as needed;also Fiber Pills   Depression    takes Lamictal daily   Fibromyalgia    GERD (gastroesophageal reflux disease)    takes Omeprazole daily   Headache(784.0)    Heart murmur    slight   History of bronchitis    last time many yrs ago(53yrs ago)   Internal hemorrhoid    Interstitial cystitis    no problems since beginning of Jan 2014   Joint pain    Joint swelling    Lupus    takes Plaquenil daily   Muscle spasm    takes Robaxin daily as needed   Numbness and tingling in hands    Osteoarthritis    Pneumonia    hx of > 19yrs ago   Raynaud's disease    Urinary frequency    Urinary urgency     Past Surgical History:  Procedure Laterality Date   ABDOMINAL HERNIA REPAIR     APPENDECTOMY     CESAREAN SECTION     CHOLECYSTECTOMY     COLONOSCOPY     DIAGNOSTIC  LAPAROSCOPY     adhesions   ENDOMETRIAL ABLATION     ESOPHAGOGASTRODUODENOSCOPY N/A 05/08/2013   Procedure: ESOPHAGOGASTRODUODENOSCOPY (EGD);  Surgeon: Malissa Hippo, MD;  Location: [facility];  Service: Endoscopy;  Laterality: N/A;  315   NASAL SEPTUM SURGERY     OVARIAN CYST REMOVAL     SHOULDER SURGERY Right    x 2   SPINAL CORD STIMULATOR INSERTION N/A 06/13/2014   Procedure: LUMBAR SPINAL CORD STIMULATOR INSERTION;  Surgeon: Gwynne Edinger, MD;  Location: [facility];  Service: Neurosurgery;  Laterality: N/A;   SPINAL FUSION     x 2    TONSILLECTOMY     TOTAL VAGINAL HYSTERECTOMY  05/2011   TUBAL LIGATION     uterine ablation      Current Outpatient Medications on File Prior to Visit  Medication Sig Dispense Refill   baclofen (LIORESAL) 10 MG tablet Take 1 tablet (10 mg total) by  mouth 3 (three) times daily. 60 each 2   Cholecalciferol (VITAMIN D3) 5000 units CAPS Take 1 tablet by mouth daily.     estradiol (VIVELLE-DOT) 0.075 MG/24HR Place 1 patch onto the skin 2 (two) times a week. 24 patch 3   Estradiol 10 MCG TABS vaginal tablet Place 1 tablet (10 mcg total) vaginally 2 (two) times a week. 24 tablet 3   famotidine (PEPCID) 20 MG tablet TAKE 1 TABLET BY MOUTH EVERYDAY AT BEDTIME 30 tablet 3   hydrochlorothiazide (HYDRODIURIL) 12.5 MG tablet Take 12.5 mg by mouth as needed.     magnesium oxide (MAG-OX) 400 MG tablet Take 400 mg by mouth daily.     predniSONE (DELTASONE) 20 MG tablet Take 1 tablet (20 mg total) by mouth daily with breakfast. Prednisone 60mg  (3 tabs) for 1 week, prednisone 40 mg (2 tabs) for 1 week, 20 mg (1 tab) for one week 42 tablet 0   TURMERIC PO Take 1 tablet by mouth daily.     No current facility-administered medications on file prior to visit.    Allergies  Allergen Reactions   Gabapentin Other (See Comments)    Made paranoia   Amoxicillin Rash   Penicillins Rash    Has patient had a PCN reaction causing immediate rash, facial/tongue/throat  swelling, SOB or lightheadedness with hypotension: yes Has patient had a PCN reaction causing severe rash involving mucus membranes or skin necrosis: yes pt did experience rash but it was not severe  Has patient had a PCN reaction that required hospitalization: no Has patient had a PCN reaction occurring within the last 10 years: no If all of the above answers are "NO", then may proceed with Cephalosporin use.    Tegretol [Carbamazepine] Rash      PE Today's Vitals   09/26/23 1102  Weight: 144 lb (65.3 kg)   Body mass index is 25.51 kg/m.  Physical Exam Vitals reviewed. Exam conducted with a chaperone present.  Constitutional:      General: She is not in acute distress.    Appearance: Normal appearance.  HENT:     Head: Normocephalic and atraumatic.     Nose: Nose normal.     Mouth/Throat:     Mouth: Oral lesions present.     Comments: 2 white lesions along inner lips Eyes:     Extraocular Movements: Extraocular movements intact.     Conjunctiva/sclera: Conjunctivae normal.  Pulmonary:     Effort: Pulmonary effort is normal.  Genitourinary:    General: Normal vulva.     Exam position: Lithotomy position.     Vagina: Normal. No vaginal discharge.     Adnexa: Right adnexa normal and left adnexa normal.     Comments: Cervix absent Musculoskeletal:        General: Normal range of motion.     Cervical back: Normal range of motion.  Neurological:     General: No focal deficit present.     Mental Status: She is alert.  Psychiatric:        Mood and Affect: Mood normal.        Behavior: Behavior normal.       Assessment and Plan:        Screen for STD (sexually transmitted disease) -     Hepatitis B surface antigen -     RPR -     HIV Antibody (routine testing w rflx) -     SURESWAB CT/NG/T. vaginalis -     HSV(herpes simplex vrs)  1+2 ab-IgG  Burning with urination -     Urinalysis, Complete   Had Hep C testing in Aug 2024, declined  Rosalyn Gess, MD

## 2023-09-27 LAB — HSV(HERPES SIMPLEX VRS) I + II AB-IGG
HAV 1 IGG,TYPE SPECIFIC AB: <0.9 {index}
HSV 2 IGG,TYPE SPECIFIC AB: 10.3 {index} — ABNORMAL HIGH

## 2023-09-27 LAB — HEPATITIS B SURFACE ANTIGEN: Hepatitis B Surface Ag: NONREACTIVE

## 2023-09-27 LAB — RPR: RPR Ser Ql: NONREACTIVE

## 2023-09-27 LAB — SURESWAB CT/NG/T. VAGINALIS
C. trachomatis RNA, TMA: NOT DETECTED
N. gonorrhoeae RNA, TMA: NOT DETECTED
Trichomonas vaginalis RNA: NOT DETECTED

## 2023-09-27 LAB — HIV ANTIBODY (ROUTINE TESTING W REFLEX): HIV 1&2 Ab, 4th Generation: NONREACTIVE

## 2023-09-28 LAB — URINALYSIS, COMPLETE W/RFL CULTURE
Bacteria, UA: NONE SEEN /[HPF]
Bilirubin Urine: NEGATIVE
Glucose, UA: NEGATIVE
Hyaline Cast: NONE SEEN /[LPF]
Ketones, ur: NEGATIVE
Leukocyte Esterase: NEGATIVE
Nitrites, Initial: NEGATIVE
Protein, ur: NEGATIVE
Specific Gravity, Urine: 1.015 (ref 1.001–1.035)
pH: 6.5 (ref 5.0–8.0)

## 2023-09-28 LAB — URINE CULTURE
MICRO NUMBER:: 15802009
Result:: NO GROWTH
SPECIMEN QUALITY:: ADEQUATE

## 2023-09-28 LAB — CULTURE INDICATED

## 2023-09-29 ENCOUNTER — Other Ambulatory Visit: Payer: Self-pay | Admitting: Obstetrics and Gynecology

## 2023-09-29 DIAGNOSIS — B009 Herpesviral infection, unspecified: Secondary | ICD-10-CM | POA: Insufficient documentation

## 2023-09-29 MED ORDER — VALACYCLOVIR HCL 500 MG PO TABS: 500.0000 mg | ORAL_TABLET | Freq: Two times a day (BID) | ORAL | 3 refills | Status: AC

## 2023-10-06 ENCOUNTER — Encounter: Payer: Self-pay | Admitting: Nurse Practitioner

## 2023-10-06 ENCOUNTER — Ambulatory Visit: Payer: 59 | Admitting: Nurse Practitioner

## 2023-10-06 VITALS — BP 118/72 | HR 72 | Resp 100 | Ht 63.0 in | Wt 144.1 lb

## 2023-10-06 DIAGNOSIS — K6289 Other specified diseases of anus and rectum: Secondary | ICD-10-CM | POA: Diagnosis not present

## 2023-10-06 DIAGNOSIS — K5902 Outlet dysfunction constipation: Secondary | ICD-10-CM | POA: Diagnosis not present

## 2023-10-06 MED ORDER — NA SULFATE-K SULFATE-MG SULF 17.5-3.13-1.6 GM/177ML PO SOLN: ORAL | 0 refills | Status: DC

## 2023-10-06 NOTE — Progress Notes (Signed)
ASSESSMENT    Brief Narrative:  57 y.o.  female known to Dr. Christella Hartigan  with a past medical history not limited to GERD, systemic lupus erythematous, gastritis,  cholecystectomy  Difficult evacuation, progressive over the last year.   She is concerned about a rectocele but also has pelvic floor dysfunction for which she has recently been evaluated by Urology and undergone physical therapy. She is concerned about colorectal cancer with an aunt and uncle having colon cancer before 2 years of age.    See [facility] for any additional medical & surgical history   PLAN   --Will schedule for a diagnostic colonoscopy, her last one was about 9 years ago.  We discussed the prep, she is certain that the prep will not be an issue.  If her stools are soft or loose then she has no problem evacuating. The risks and benefits of colonoscopy with possible polypectomy / biopsies were discussed and the patient agrees to proceed.  -- May benefit from pelvic imaging at some point  HPI   Chief complaint : possible rectal prolapse  Patient was last seen July 2023.  She comes in today concerned about rectal prolapse.  She has been having significant difficulty evacuating rectum during bowel movements.  Problem is progressive. She is having to "splint" vagina with a device during BMs to get stool to pass.  Her stools are not hard.  It sounds like she may have referred herself to urology for urinary incontinence and she has been undergoing what sounds like pelvic floor physical therapy.  She was told by someone that a rectocele was suspected.  She is really distressed about the degree of difficulty she is having with bowel movements.  Additionally she describes a very foul odor coming from her rectum at times and this is very embarrassing for her.  She has not seen any blood in her stool.  She is concerned there could be more going on such as colon cancer.  She had a maternal uncle who had colon cancer in his 81s and a  paternal aunt who had colon cancer around age 71.  It has been close to 10 years since her last colonoscopy      Latest Ref Rng & Units 06/16/2023    8:55 AM 05/17/2022    3:16 PM 12/29/2021    8:29 AM  Hepatic Function  Total Protein 6.0 - 8.5 g/dL 6.4  6.6  6.3   Albumin 3.8 - 4.9 g/dL 4.3  4.4  4.3   AST 0 - 40 IU/L 18  14  23    ALT 0 - 32 IU/L 16  16  18    Alk Phosphatase 44 - 121 IU/L 48  52  47   Total Bilirubin 0.0 - 1.2 mg/dL 0.3  0.2  0.4        Latest Ref Rng & Units 06/16/2023    8:55 AM 05/17/2022    3:16 PM 12/29/2021    8:29 AM  CBC  WBC 3.4 - 10.8 x10E3/uL 3.4  6.7  2.8   Hemoglobin 11.1 - 15.9 g/dL 16.1  09.6  04.5   Hematocrit 34.0 - 46.6 % 39.4  38.9  39.4   Platelets 150 - 450 x10E3/uL 255  252  232      Past Medical History:  Diagnosis Date   Abdominal pain    Allergy    Anxiety    takes Xanax daily as needed   Arthritis    Cancer (HCC)  MOLE PRE   Cerebrospinal fluid leak from spinal puncture 08/24/2012   Chronic back pain    Constipation    takes Dulcolax and Miralax daily as needed;also Fiber Pills   Depression    takes Lamictal daily   Fibromyalgia    GERD (gastroesophageal reflux disease)    takes Omeprazole daily   Headache(784.0)    Heart murmur    slight   History of bronchitis    last time many yrs ago(76yrs ago)   Internal hemorrhoid    Interstitial cystitis    no problems since beginning of Jan 2014   Joint pain    Joint swelling    Lupus    takes Plaquenil daily   Muscle spasm    takes Robaxin daily as needed   Numbness and tingling in hands    Osteoarthritis    Pneumonia    hx of > 97yrs ago   Raynaud's disease    Urinary frequency    Urinary urgency     Past Surgical History:  Procedure Laterality Date   ABDOMINAL HERNIA REPAIR     APPENDECTOMY     CESAREAN SECTION     CHOLECYSTECTOMY     COLONOSCOPY     DIAGNOSTIC LAPAROSCOPY     adhesions   ENDOMETRIAL ABLATION     ESOPHAGOGASTRODUODENOSCOPY N/A 05/08/2013    Procedure: ESOPHAGOGASTRODUODENOSCOPY (EGD);  Surgeon: Malissa Hippo, MD;  Location: [facility];  Service: Endoscopy;  Laterality: N/A;  315   NASAL SEPTUM SURGERY     OVARIAN CYST REMOVAL     SHOULDER SURGERY Right    x 2   SPINAL CORD STIMULATOR INSERTION N/A 06/13/2014   Procedure: LUMBAR SPINAL CORD STIMULATOR INSERTION;  Surgeon: Gwynne Edinger, MD;  Location: [facility];  Service: Neurosurgery;  Laterality: N/A;   SPINAL FUSION     x 2    TONSILLECTOMY     TOTAL VAGINAL HYSTERECTOMY  05/2011   TUBAL LIGATION     uterine ablation      Family History  Problem Relation Age of Onset   Hyperlipidemia Mother    Depression Mother    Anxiety disorder Mother    Breast cancer Mother    Lung cancer Mother    Colon polyps Father    Breast cancer Maternal Grandmother    Alzheimer's disease Maternal Grandmother    Ovarian cancer Maternal Aunt    Colon cancer Maternal Uncle    Colon cancer Paternal Aunt    Esophageal cancer Neg Hx    Pancreatic cancer Neg Hx    Stomach cancer Neg Hx    Rectal cancer Neg Hx     Current Medications, Allergies, Family History and Social History were reviewed in [employer] medical record.     Current Outpatient Medications  Medication Sig Dispense Refill   baclofen (LIORESAL) 10 MG tablet Take 1 tablet (10 mg total) by mouth 3 (three) times daily. (Patient taking differently: Take 10 mg by mouth 3 (three) times daily. PRN) 60 each 2   Cholecalciferol (VITAMIN D3) 5000 units CAPS Take 1 tablet by mouth daily.     estradiol (VIVELLE-DOT) 0.075 MG/24HR Place 1 patch onto the skin 2 (two) times a week. 24 patch 3   Estradiol 10 MCG TABS vaginal tablet Place 1 tablet (10 mcg total) vaginally 2 (two) times a week. 24 tablet 3   famotidine (PEPCID) 20 MG tablet TAKE 1 TABLET BY MOUTH EVERYDAY AT BEDTIME 30 tablet 3   hydrochlorothiazide (  HYDRODIURIL) 12.5 MG tablet Take 12.5 mg by mouth as needed.     hydrocortisone 2.5 % ointment  SMARTSIG:sparingly Topical Twice Daily PRN     magnesium oxide (MAG-OX) 400 MG tablet Take 400 mg by mouth daily.     TURMERIC PO Take 1 tablet by mouth daily.     No current facility-administered medications for this visit.    Review of Systems: Positive for some urinary incontinence.  All other systems reviewed and negative except were noted in the HPI   Physical Exam  Filed Weights   10/06/23 1512  Weight: 144 lb 2 oz (65.4 kg)   Wt Readings from Last 3 Encounters:  10/06/23 144 lb 2 oz (65.4 kg)  09/26/23 144 lb (65.3 kg)  08/31/23 144 lb (65.3 kg)    BP 118/72   Pulse 72   Resp (!) 100   Ht 5\' 3"  (1.6 m)   Wt 144 lb 2 oz (65.4 kg)   BMI 25.53 kg/m  Constitutional:  Pleasant, generally well appearing female in no acute distress. Psychiatric: Normal mood and affect. Behavior is normal. EENT: Pupils normal.  Conjunctivae are normal. No scleral icterus. Neck supple.  Cardiovascular: Normal rate, regular rhythm.  Pulmonary/chest: Effort normal and breath sounds normal. No wheezing, rales or rhonchi. Abdominal: Soft, nondistended, nontender. Bowel sounds active throughout. There are no masses palpable. No hepatomegaly. Rectal: No obvious rectal prolapse Neurological: Alert and oriented to person place and time.    Willette Cluster, NP  10/06/2023, 3:33 PM

## 2023-10-06 NOTE — Patient Instructions (Addendum)
You have been scheduled for a colonoscopy. Please follow written instructions given to you at your visit today.   Please pick up your prep supplies at the pharmacy within the next 1-3 days.  If you use inhalers (even only as needed), please bring them with you on the day of your procedure.  DO NOT TAKE 7 DAYS PRIOR TO TEST- Trulicity (dulaglutide) Ozempic, Wegovy (semaglutide) Mounjaro (tirzepatide) Bydureon Bcise (exanatide extended release)  DO NOT TAKE 1 DAY PRIOR TO YOUR TEST Rybelsus (semaglutide) Adlyxin (lixisenatide) Victoza (liraglutide) Byetta (exanatide) ___________________________________________________________________________  We have sent the following medications to your pharmacy for you to pick up at your convenience: Suprep  If your blood pressure at your visit was 140/90 or greater, please contact your primary care physician to follow up on this.  _______________________________________________________  If you are age 41 or older, your body mass index should be between 23-30. Your Body mass index is 25.53 kg/m. If this is out of the aforementioned range listed, please consider follow up with your Primary Care Provider.  If you are age 32 or younger, your body mass index should be between 19-25. Your Body mass index is 25.53 kg/m. If this is out of the aformentioned range listed, please consider follow up with your Primary Care Provider.   ________________________________________________________  The [facility] providers would like to encourage you to use [facility] to communicate with providers for non-urgent requests or questions.  Due to long hold times on the telephone, sending your provider a message by [facility] may be a faster and more efficient way to get a response.  Please allow 48 business hours for a response.  Please remember that this is for non-urgent requests.  _______________________________________________________  Due to recent changes in healthcare  laws, you may see the results of your imaging and laboratory studies on MyChart before your provider has had a chance to review them.  We understand that in some cases there may be results that are confusing or concerning to you. Not all laboratory results come back in the same time frame and the provider may be waiting for multiple results in order to interpret others.  Please give [place] 48 hours in order for your provider to thoroughly review all the results before contacting the office for clarification of your results.    Thank you for entrusting me with your care and choosing [facility].  Willette Cluster NP

## 2023-10-09 NOTE — Progress Notes (Signed)
I agree with the assessment and plan as outlined by Ms. Guenther. 

## 2023-10-10 ENCOUNTER — Telehealth: Payer: Self-pay | Admitting: Family Medicine

## 2023-10-10 NOTE — Telephone Encounter (Signed)
Copied from CRM [id number]. Topic: Clinical - Medical Advice >> Oct 10, 2023  8:48 AM Herbert Seta B wrote: Reason for CRM: Patient fell and hurt L knee beginning of November 2024, pain is getting worse. Patient would like to know if PCP can conduct telehealth appointment or if she should see Orthopedic specialty.

## 2023-10-12 ENCOUNTER — Telehealth: Payer: 59 | Admitting: Family

## 2023-10-12 ENCOUNTER — Encounter: Payer: Self-pay | Admitting: Family

## 2023-10-12 ENCOUNTER — Telehealth: Payer: Self-pay

## 2023-10-12 DIAGNOSIS — M25562 Pain in left knee: Secondary | ICD-10-CM | POA: Diagnosis not present

## 2023-10-12 DIAGNOSIS — W19XXXA Unspecified fall, initial encounter: Secondary | ICD-10-CM

## 2023-10-12 NOTE — Progress Notes (Signed)
Virtual Visit Consent   Maureen [name], you are scheduled for a virtual visit with a [facility] provider today. Just as with appointments in the office, your consent must be obtained to participate. Your consent will be active for this visit and any virtual visit you may have with one of our providers in the next 365 days. If you have a MyChart account, a copy of this consent can be sent to you electronically.  As this is a virtual visit, video technology does not allow for your provider to perform a traditional examination. This may limit your provider's ability to fully assess your condition. If your provider identifies any concerns that need to be evaluated in person or the need to arrange testing (such as labs, EKG, etc.), we will make arrangements to do so. Although advances in technology are sophisticated, we cannot ensure that it will always work on either your end or our end. If the connection with a video visit is poor, the visit may have to be switched to a telephone visit. With either a video or telephone visit, we are not always able to ensure that we have a secure connection.  By engaging in this virtual visit, you consent to the provision of healthcare and authorize for your insurance to be billed (if applicable) for the services provided during this visit. Depending on your insurance coverage, you may receive a charge related to this service.  I need to obtain your verbal consent now. Are you willing to proceed with your visit today? Maureen [name] has provided verbal consent on 10/12/2023 for a virtual visit (video or telephone). Jannifer Rodney, FNP  Date: 10/12/2023 3:21 PM  Virtual Visit via Video Note   I, Jannifer Rodney, connected with  Maureen [name]  ([id number], [birth year]) on 10/12/23 at  2:25 PM EST by a video-enabled telemedicine application and verified that I am speaking with the correct person using two identifiers.  Location: Patient: Virtual Visit Location Patient:  work Provider: Pharmacist, community: Home Office   I discussed the limitations of evaluation and management by telemedicine and the availability of in person appointments. The patient expressed understanding and agreed to proceed.    History of Present Illness: Maureen [name] is a 57 y.o. who identifies as a female who was assigned female at birth, and is being seen today for left knee pain since November 8 after a fall. She went to the ED on 09/02/23 and had a negative x-ray. She completed prednisone, ice, motrin, and tylenol.    HPI: Knee Pain  The incident occurred more than 1 week ago. The injury mechanism was a fall. The pain is present in the left knee. The quality of the pain is described as aching. The pain is at a severity of 4/10. The pain is moderate. The pain has been Intermittent since onset. Pertinent negatives include no numbness or tingling. She reports no foreign bodies present. The symptoms are aggravated by movement. She has tried NSAIDs and acetaminophen for the symptoms. The treatment provided mild relief.    Problems:  Patient Active Problem List   Diagnosis Date Noted   HSV-2 (herpes simplex virus 2) infection 09/29/2023   Primary osteoarthritis 07/19/2022   Bipolar 1 disorder, manic, mild (HCC) 12/28/2021   Aortic atherosclerosis (HCC) 02/08/2021   Vision changes 05/28/2020   Paresthesia 05/28/2020   Cervical spondylosis with radiculopathy 07/01/2016   Spondylolisthesis 07/01/2016   Greater trochanteric bursitis of right hip 08/20/2015   Chronic interstitial  cystitis 11/25/2014   H/O Spinal surgery 06/24/2014   Fibrositis 01/29/2014   Fibromyalgia 01/29/2014   Lumbosacral radiculitis 10/09/2013   Lumbar post-laminectomy syndrome 09/17/2013   Atypical chest pain 09/02/2013   Other long term (current) drug therapy 05/01/2013   Arthritis 04/16/2013   Abdominal pain, epigastric 03/26/2013   Back pain, chronic 12/05/2012   Connective tissue disease,  undifferentiated (HCC) 12/05/2012   Raynaud's syndrome without gangrene 12/05/2012   Systemic lupus erythematosus (HCC) 12/05/2012   Raynaud's phenomenon 12/05/2012   Cerebrospinal fluid leak from spinal puncture 08/24/2012   UNSPECIFIED DISORDER OF STOMACH AND DUODENUM 08/26/2008    Allergies:  Allergies  Allergen Reactions   Gabapentin Other (See Comments)    Made paranoia   Amoxicillin Rash   Penicillins Rash    Has patient had a PCN reaction causing immediate rash, facial/tongue/throat swelling, SOB or lightheadedness with hypotension: yes Has patient had a PCN reaction causing severe rash involving mucus membranes or skin necrosis: yes pt did experience rash but it was not severe  Has patient had a PCN reaction that required hospitalization: no Has patient had a PCN reaction occurring within the last 10 years: no If all of the above answers are "NO", then may proceed with Cephalosporin use.    Tegretol [Carbamazepine] Rash   Medications:  Current Outpatient Medications:    baclofen (LIORESAL) 10 MG tablet, Take 1 tablet (10 mg total) by mouth 3 (three) times daily. (Patient taking differently: Take 10 mg by mouth 3 (three) times daily. PRN), Disp: 60 each, Rfl: 2   Cholecalciferol (VITAMIN D3) 5000 units CAPS, Take 1 tablet by mouth daily., Disp: , Rfl:    estradiol (VIVELLE-DOT) 0.075 MG/24HR, Place 1 patch onto the skin 2 (two) times a week., Disp: 24 patch, Rfl: 3   Estradiol 10 MCG TABS vaginal tablet, Place 1 tablet (10 mcg total) vaginally 2 (two) times a week., Disp: 24 tablet, Rfl: 3   famotidine (PEPCID) 20 MG tablet, TAKE 1 TABLET BY MOUTH EVERYDAY AT BEDTIME, Disp: 30 tablet, Rfl: 3   hydrochlorothiazide (HYDRODIURIL) 12.5 MG tablet, Take 12.5 mg by mouth as needed., Disp: , Rfl:    hydrocortisone 2.5 % ointment, SMARTSIG:sparingly Topical Twice Daily PRN, Disp: , Rfl:    magnesium oxide (MAG-OX) 400 MG tablet, Take 400 mg by mouth daily., Disp: , Rfl:    Na Sulfate-K  Sulfate-Mg Sulf 17.5-3.13-1.6 GM/177ML SOLN, Use as directed; may use generic; goodrx card if insurance will not cover generic, Disp: 354 mL, Rfl: 0   TURMERIC PO, Take 1 tablet by mouth daily., Disp: , Rfl:   Observations/Objective: Patient is well-developed, well-nourished in no acute distress.  Resting comfortably   Head is normocephalic, atraumatic.  No labored breathing.  Speech is clear and coherent with logical content.  Patient is alert and oriented at baseline.    Assessment and Plan: 1. Acute pain of left knee (Primary) - MR Knee Left  Wo Contrast; Future  2. Fall on concrete - MR Knee Left  Wo Contrast; Future  Continue tylenol ROM exercises  MRI pending  Follow up if symptoms worsen or do not improve   Follow Up Instructions: I discussed the assessment and treatment plan with the patient. The patient was provided an opportunity to ask questions and all were answered. The patient agreed with the plan and demonstrated an understanding of the instructions.  A copy of instructions were sent to the patient via MyChart unless otherwise noted below.     The patient  was advised to call back or seek an in-person evaluation if the symptoms worsen or if the condition fails to improve as anticipated.    Jannifer Rodney, FNP

## 2023-10-12 NOTE — Telephone Encounter (Signed)
Pt Maureen [name] in triage line requesting a CB from [place] re: recent test results/question.  Spoke w/ pt and she reported that she saw dermatologist re: her lip sxs and they dxd and txd her with "Cheilitis." She reports that those sxs are improving for now but is still very shocked re: the HSV2+ result from previous visit and with the fact that with her Lupus, has never had an HSV outbreak and is requesting to have re-test when she goes to PCP next week for other tests. Is aware that the chance for a repeat test to come back negative is slim to none but would still like a re-test for confirmation.  Requesting that it be ok if we forwarded the order for the test to be done w/ PCP.  Please advise.

## 2023-10-12 NOTE — Telephone Encounter (Signed)
Per GH:  "Okay to order repeat serum HSV IgG. Where is she completing her labs?  Alternatively, her PCP can add on the test."  Order written up and placed on [facility] desk for authorization.   To send to PCP office per pt's request.

## 2023-10-12 NOTE — Telephone Encounter (Signed)
Appt made

## 2023-10-12 NOTE — Telephone Encounter (Signed)
Order signed by [facility] and faxed successfully to pt's PCP.  Pt notified via mychart msg.   Routing to provider for final review.

## 2023-10-12 NOTE — Telephone Encounter (Signed)
We can do a visit with her and give her medications to see if this helps. If not we would send to Ortho.

## 2023-10-13 ENCOUNTER — Telehealth: Payer: Self-pay | Admitting: Family

## 2023-10-13 NOTE — Telephone Encounter (Unsigned)
Copied from CRM [id number]. Topic: Clinical - Request for Lab/Test Order >> Oct 13, 2023  3:12 PM Ivette P wrote: Reason for CRM: Patient called in because they were scheduled an MRI at the hospital and patient is requesting to be sent to [facility] imaging. Is requesting call back [phone]

## 2023-10-13 NOTE — Telephone Encounter (Deleted)
Copied from CRM [id number]. Topic: Appointments - Scheduling Inquiry for Clinic >> Oct 13, 2023  2:59 PM Ivette P wrote: Reason for CRM: Patient called in because they were scheduled an MRI at the hospital and patient is requesting to be sent to [facility] imaging. Is requesting call back [phone]

## 2023-10-16 ENCOUNTER — Other Ambulatory Visit: Payer: 59

## 2023-10-16 DIAGNOSIS — R55 Syncope and collapse: Secondary | ICD-10-CM

## 2023-10-16 LAB — CBC WITH DIFFERENTIAL/PLATELET
Basophils Absolute: 0 10*3/uL (ref 0.0–0.2)
Basos: 1 %
EOS (ABSOLUTE): 0 10*3/uL (ref 0.0–0.4)
Eos: 2 %
Hematocrit: 39.1 % (ref 34.0–46.6)
Hemoglobin: 13.4 g/dL (ref 11.1–15.9)
Immature Grans (Abs): 0 10*3/uL (ref 0.0–0.1)
Immature Granulocytes: 0 %
Lymphocytes Absolute: 0.9 10*3/uL (ref 0.7–3.1)
Lymphs: 32 %
MCH: 33.1 pg — ABNORMAL HIGH (ref 26.6–33.0)
MCHC: 34.3 g/dL (ref 31.5–35.7)
MCV: 97 fL (ref 79–97)
Monocytes Absolute: 0.3 10*3/uL (ref 0.1–0.9)
Monocytes: 12 %
Neutrophils Absolute: 1.5 10*3/uL (ref 1.4–7.0)
Neutrophils: 53 %
Platelets: 230 10*3/uL (ref 150–450)
RBC: 4.05 x10E6/uL (ref 3.77–5.28)
RDW: 12.9 % (ref 11.7–15.4)
WBC: 2.7 10*3/uL — ABNORMAL LOW (ref 3.4–10.8)

## 2023-10-16 LAB — CMP14+EGFR
ALT: 22 [IU]/L (ref 0–32)
AST: 21 [IU]/L (ref 0–40)
Albumin: 4.1 g/dL (ref 3.8–4.9)
Alkaline Phosphatase: 47 [IU]/L (ref 44–121)
BUN/Creatinine Ratio: 16 (ref 9–23)
BUN: 11 mg/dL (ref 6–24)
Bilirubin Total: 0.3 mg/dL (ref 0.0–1.2)
CO2: 26 mmol/L (ref 20–29)
Calcium: 9 mg/dL (ref 8.7–10.2)
Chloride: 99 mmol/L (ref 96–106)
Creatinine, Ser: 0.7 mg/dL (ref 0.57–1.00)
Globulin, Total: 1.8 g/dL (ref 1.5–4.5)
Glucose: 83 mg/dL (ref 70–99)
Potassium: 4.4 mmol/L (ref 3.5–5.2)
Sodium: 136 mmol/L (ref 134–144)
Total Protein: 5.9 g/dL — ABNORMAL LOW (ref 6.0–8.5)
eGFR: 101 mL/min/{1.73_m2} (ref >59)

## 2023-10-19 DIAGNOSIS — M329 Systemic lupus erythematosus, unspecified: Secondary | ICD-10-CM

## 2023-10-19 DIAGNOSIS — D72819 Decreased white blood cell count, unspecified: Secondary | ICD-10-CM

## 2023-10-19 NOTE — Progress Notes (Signed)
Slight variation on CMP, not concerning at this time. Can increase protein intake in diet. Slight variations on CBC, stable with past. Will continue to monitor. Recommend patient follow up if symptoms continue.

## 2023-10-21 ENCOUNTER — Ambulatory Visit (HOSPITAL_COMMUNITY)
Admission: RE | Admit: 2023-10-21 | Discharge: 2023-10-21 | Disposition: A | Discharge status: Home / Self Care | Payer: 59 | Source: Ambulatory Visit | Attending: Family | Admitting: Family

## 2023-10-21 DIAGNOSIS — W19XXXA Unspecified fall, initial encounter: Secondary | ICD-10-CM | POA: Insufficient documentation

## 2023-10-21 DIAGNOSIS — M1712 Unilateral primary osteoarthritis, left knee: Secondary | ICD-10-CM | POA: Diagnosis not present

## 2023-10-21 DIAGNOSIS — M25562 Pain in left knee: Secondary | ICD-10-CM | POA: Insufficient documentation

## 2023-10-26 NOTE — Addendum Note (Signed)
 Addended by: Jannifer Rodney A on: 10/26/2023 01:49 PM   Modules accepted: Orders

## 2023-10-31 DIAGNOSIS — D72819 Decreased white blood cell count, unspecified: Secondary | ICD-10-CM | POA: Insufficient documentation

## 2023-10-31 NOTE — Progress Notes (Signed)
 Maureen [name], Maureen [name]   Clinic Day:  11/01/2023  Referring physician: Lavell Bari LABOR, FNP  Patient Care Team: Lavell Bari LABOR, FNP as PCP - General (Family Medicine) Alvan Ronal BRAVO, MD as PCP - Cardiology (Cardiology)   ASSESSMENT & PLAN:   Assessment:  1.  Leukopenia and neutropenia: - CBC (10/16/2023): WBC-2.7, ANC-1.5,N-53%, L-32%, M-12%, B-1% - CTAP (08/31/2020): Spleen was unremarkable.  No adenopathy. - Review of labs in care everywhere shows she has mild leukopenia without significant neutropenia since 2014. - She was treated at [facility] rheumatology for undifferentiated connective tissue disease with features predominantly of lupus.  As per notes from [facility], she was treated with Plaquenil, azathioprine, methotrexate and prednisone .  She stopped treatments as she could not tolerate them well. - No B symptoms or recurrent infections.  Reports fatigue.  2.  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  Quit smoking in 2009.  Smoked half pack per day for 20 years. - Mother had breast cancer and lung cancer in her 86s.  Father had prostate cancer.  Maternal uncle had colon cancer in his 47s.  Paternal aunt had colon cancer.  Plan:  1.  Leukopenia and neutropenia: - We discussed the etiology of her leukopenia.  She has mostly leukopenia and on rare occasion had mild neutropenia. - Physical examination: Liver edge palpable and tender.  No splenomegaly palpable.  No adenopathy. - Will repeat CBC today and check for inflammatory markers and for nutritional deficiencies.  Will also check for infectious etiologies.  Will check blood typing for Duffy antigen as Duffy null RBC phenotype is associated with mild neutropenia without recurrent infections and is considered normal variant.  Will also check spleen ultrasound to evaluate for splenomegaly.  RTC 2 to 4 weeks to discuss results and  plan.   Orders Placed This Encounter  Procedures   US  SPLEEN (ABDOMEN LIMITED)    Standing Status:   Future    Expiration Date:   10/31/2024    Reason for Exam (SYMPTOM  OR DIAGNOSIS REQUIRED):   eval for splenomegaly    Preferred imaging location?:   [facility]   CBC with Differential    Standing Status:   Future    Expected Date:   11/01/2023    Expiration Date:   10/31/2024   Sedimentation rate    Standing Status:   Future    Expected Date:   11/01/2023    Expiration Date:   10/31/2024   C-reactive protein    Standing Status:   Future    Expected Date:   11/01/2023    Expiration Date:   10/31/2024   Vitamin B12    Standing Status:   Future    Expected Date:   11/01/2023    Expiration Date:   10/31/2024   Folate    Standing Status:   Future    Expected Date:   11/01/2023    Expiration Date:   10/31/2024   Methylmalonic acid, serum    Standing Status:   Future    Expected Date:   11/01/2023    Expiration Date:   10/31/2024   Copper , serum    Standing Status:   Future    Expected Date:   11/01/2023    Expiration Date:   10/31/2024   HIV antibody (with reflex)    Standing Status:   Future    Expected Date:  11/01/2023    Expiration Date:   10/31/2024   Hepatitis B core antibody, total    Standing Status:   Future    Expected Date:   11/01/2023    Expiration Date:   10/31/2024   Hepatitis C antibody    Standing Status:   Future    Expected Date:   11/01/2023    Expiration Date:   10/31/2024   Hepatitis B surface antigen    Standing Status:   Future    Expected Date:   11/01/2023    Expiration Date:   10/31/2024   Hepatitis B surface antibody,qualitative    Standing Status:   Future    Expected Date:   11/01/2023    Expiration Date:   10/31/2024   Miscellaneous [facility] test (send-out)    Standing Status:   Future    Expected Date:   11/01/2023    Expiration Date:   10/31/2024    Test name / description::   Red Blood Cell Antigen Typing: Fya/Fyb test code: [id number]    Release to patient:   Immediate    Type and screen         Standing Status:   Future    Expected Date:   11/01/2023    Expiration Date:   10/31/2024      I,Katie Daubenspeck,acting as a scribe for Alean Stands, MD.,have documented all relevant documentation on the behalf of Alean Stands, MD,as directed by  Alean Stands, MD while in the presence of Alean Stands, MD.   I, Alean Stands MD, have reviewed the above documentation for accuracy and completeness, and I agree with the above.   Alean Stands, MD   1/8/20259:07 AM  CHIEF COMPLAINT/PURPOSE OF CONSULT:   Diagnosis: low WBC count  Current Therapy: Under workup  HISTORY OF PRESENT ILLNESS:   Maureen [name] is a 58 y.o. female presenting to clinic today for evaluation of low WBC count at the request of [name], FNP.  She has a history of a low WBC count, previously from 2014 through 2019 per [facility] records and 07/2020 through 12/2021 per [facility] records. Most recently, she had repeat CBC with her PCP on 10/16/23 showing a low WBC count of 2.7, with slightly elevated MCH at 33.1.  Today, she states that she is doing well overall. Her appetite level is at 100%. Her energy level is at 10%.  PAST MEDICAL HISTORY:   Past Medical History: Past Medical History:  Diagnosis Date   Abdominal pain    Allergy    Anxiety    takes Xanax  daily as needed   Arthritis    Cancer (HCC)    MOLE PRE   Cerebrospinal fluid leak from spinal puncture 08/24/2012   Chronic back pain    Constipation    takes Dulcolax and Miralax  daily as needed;also Fiber Pills   Depression    takes Lamictal  daily   Fibromyalgia    GERD (gastroesophageal reflux disease)    takes Omeprazole  daily   Headache(784.0)    Heart murmur    slight   History of bronchitis    last time many yrs ago(7yrs ago)   Internal hemorrhoid    Interstitial cystitis    no problems since beginning of Jan 2014   Joint pain    Joint swelling    Lupus    takes Plaquenil  daily   Muscle spasm    takes Robaxin  daily as needed   Numbness and tingling in hands    Osteoarthritis  Pneumonia    hx of > 42yrs ago   Raynaud's disease    Urinary frequency    Urinary urgency     Surgical History: Past Surgical History:  Procedure Laterality Date   ABDOMINAL HERNIA REPAIR     APPENDECTOMY     CESAREAN SECTION     CHOLECYSTECTOMY     COLONOSCOPY     DIAGNOSTIC LAPAROSCOPY     adhesions   ENDOMETRIAL ABLATION     ESOPHAGOGASTRODUODENOSCOPY N/A 05/08/2013   Procedure: ESOPHAGOGASTRODUODENOSCOPY (EGD);  Surgeon: Claudis RAYMOND Rivet, MD;  Location: [facility];  Service: Endoscopy;  Laterality: N/A;  315   NASAL SEPTUM SURGERY     OVARIAN CYST REMOVAL     SHOULDER SURGERY Right    x 2   SPINAL CORD STIMULATOR INSERTION N/A 06/13/2014   Procedure: LUMBAR SPINAL CORD STIMULATOR INSERTION;  Surgeon: Deward JAYSON Fabian, MD;  Location: [facility];  Service: Neurosurgery;  Laterality: N/A;   SPINAL FUSION     x 2    TONSILLECTOMY     TOTAL VAGINAL HYSTERECTOMY  05/2011   TUBAL LIGATION     uterine ablation      Social History: Social History   Socioeconomic History   Marital status: Legally Separated    Spouse name: Not on file   Number of children: Not on file   Years of education: Not on file   Highest education level: Associate degree: academic program  Occupational History   Not on file  Tobacco Use   Smoking status: Former    Current packs/day: 0.00    Average packs/day: 0.5 packs/day for 20.0 years (10.0 ttl pk-yrs)    Types: Cigarettes    Start date: 06/09/1988    Quit date: 06/09/2008    Years since quitting: 15.4   Smokeless tobacco: Never  Vaping Use   Vaping status: Never Used  Substance and Sexual Activity   Alcohol use: Yes    Comment: occassionaly   Drug use: No   Sexual activity: Yes    Partners: Male    Birth control/protection: Surgical  Other Topics Concern   Not on file  Social History Narrative   Not on file   Social  Drivers of Health   Financial Resource Strain: Medium Risk (08/31/2023)   Overall Financial Resource Strain (CARDIA)    Difficulty of Paying Living Expenses: Somewhat hard  Food Insecurity: No Food Insecurity (11/01/2023)   Hunger Vital Sign    Worried About Running Out of Food in the Last Year: Never true    Ran Out of Food in the Last Year: Never true  Transportation Needs: No Transportation Needs (11/01/2023)   PRAPARE - Administrator, Civil Service (Medical): No    Lack of Transportation (Non-Medical): No  Physical Activity: Sufficiently Active (08/31/2023)   Exercise Vital Sign    Days of Exercise per Week: 5 days    Minutes of Exercise per Session: 50 min  Stress: Stress Concern Present (08/31/2023)   [employer] of Occupational Health - Occupational Stress Questionnaire    Feeling of Stress : To some extent  Social Connections: Moderately Isolated (08/31/2023)   Social Connection and Isolation Panel [NHANES]    Frequency of Communication with Friends and Family: More than three times a week    Frequency of Social Gatherings with Friends and Family: Twice a week    Attends Religious Services: More than 4 times per year    Active Member of [employer] or Organizations:  No    Attends Banker Meetings: Not on file    Marital Status: Divorced  Intimate Partner Violence: Not At Risk (11/01/2023)   Humiliation, Afraid, Rape, and Kick questionnaire    Fear of Current or Ex-Partner: No    Emotionally Abused: No    Physically Abused: No    Sexually Abused: No    Family History: Family History  Problem Relation Age of Onset   Hyperlipidemia Mother    Depression Mother    Anxiety disorder Mother    Breast cancer Mother    Lung cancer Mother    Colon polyps Father    Breast cancer Maternal Grandmother    Alzheimer's disease Maternal Grandmother    Ovarian cancer Maternal Aunt    Colon cancer Maternal Uncle    Colon cancer Paternal Aunt    Esophageal cancer  Neg Hx    Pancreatic cancer Neg Hx    Stomach cancer Neg Hx    Rectal cancer Neg Hx     Current Medications:  Current Outpatient Medications:    baclofen  (LIORESAL ) 10 MG tablet, Take 1 tablet (10 mg total) by mouth 3 (three) times daily. (Patient taking differently: Take 10 mg by mouth 3 (three) times daily. PRN), Disp: 60 each, Rfl: 2   Cholecalciferol (VITAMIN D3) 5000 units CAPS, Take 1 tablet by mouth daily., Disp: , Rfl:    estradiol  (VIVELLE -DOT) 0.075 MG/24HR, Place 1 patch onto the skin 2 (two) times a week., Disp: 24 patch, Rfl: 3   Estradiol  10 MCG TABS vaginal tablet, Place 1 tablet (10 mcg total) vaginally 2 (two) times a week., Disp: 24 tablet, Rfl: 3   famotidine  (PEPCID ) 20 MG tablet, TAKE 1 TABLET BY MOUTH EVERYDAY AT BEDTIME, Disp: 30 tablet, Rfl: 3   hydrochlorothiazide  (HYDRODIURIL ) 12.5 MG tablet, Take 12.5 mg by mouth as needed., Disp: , Rfl:    hydrocortisone 2.5 % ointment, SMARTSIG:sparingly Topical Twice Daily PRN, Disp: , Rfl:    magnesium oxide (MAG-OX) 400 MG tablet, Take 400 mg by mouth daily., Disp: , Rfl:    Na Sulfate-K Sulfate-Mg Sulf 17.5-3.13-1.6 GM/177ML SOLN, Use as directed; may use generic; goodrx card if insurance will not cover generic, Disp: 354 mL, Rfl: 0   TURMERIC PO, Take 1 tablet by mouth daily., Disp: , Rfl:    Allergies: Allergies  Allergen Reactions   Gabapentin  Other (See Comments)    Made paranoia   Amoxicillin Rash   Penicillins Rash    Has patient had a PCN reaction causing immediate rash, facial/tongue/throat swelling, SOB or lightheadedness with hypotension: yes Has patient had a PCN reaction causing severe rash involving mucus membranes or skin necrosis: yes pt did experience rash but it was not severe  Has patient had a PCN reaction that required hospitalization: no Has patient had a PCN reaction occurring within the last 10 years: no If all of the above answers are NO, then may proceed with Cephalosporin use.    Tegretol  [Carbamazepine] Rash    REVIEW OF SYSTEMS:   Review of Systems  Constitutional:  Negative for chills, fatigue and fever.  HENT:   Negative for lump/mass, mouth sores, nosebleeds, sore throat and trouble swallowing.   Eyes:  Negative for eye problems.  Respiratory:  Positive for shortness of breath. Negative for cough.   Cardiovascular:  Positive for palpitations. Negative for chest pain and leg swelling.  Gastrointestinal:  Positive for constipation. Negative for abdominal pain, diarrhea, nausea and vomiting.  Genitourinary:  Negative for  bladder incontinence, difficulty urinating, dysuria, frequency, hematuria and nocturia.   Musculoskeletal:  Negative for arthralgias, back pain, flank pain, myalgias and neck pain.  Skin:  Negative for itching and rash.  Neurological:  Positive for dizziness, headaches and numbness.  Hematological:  Does not bruise/bleed easily.  Psychiatric/Behavioral:  Positive for depression. Negative for sleep disturbance and suicidal ideas. The patient is nervous/anxious.   All other systems reviewed and are negative.    VITALS:   Blood pressure 114/83, pulse 71, temperature 97.6 F (36.4 C), temperature source Oral, resp. rate 16, weight 145 lb 4.5 oz (65.9 kg), SpO2 100%.  Wt Readings from Last 3 Encounters:  11/01/23 145 lb 4.5 oz (65.9 kg)  10/06/23 144 lb 2 oz (65.4 kg)  09/26/23 144 lb (65.3 kg)    Body mass index is 25.74 kg/m.   PHYSICAL EXAM:   Physical Exam Vitals and nursing note reviewed. Exam conducted with a chaperone present.  Constitutional:      Appearance: Normal appearance.  Cardiovascular:     Rate and Rhythm: Normal rate and regular rhythm.     Pulses: Normal pulses.     Heart sounds: Normal heart sounds.  Pulmonary:     Effort: Pulmonary effort is normal.     Breath sounds: Normal breath sounds.  Abdominal:     Palpations: Abdomen is soft. There is no hepatomegaly, splenomegaly or mass.     Tenderness: There is no abdominal  tenderness.  Musculoskeletal:     Right lower leg: No edema.     Left lower leg: No edema.  Lymphadenopathy:     Cervical: No cervical adenopathy.     Right cervical: No superficial, deep or posterior cervical adenopathy.    Left cervical: No superficial, deep or posterior cervical adenopathy.     Upper Body:     Right upper body: No supraclavicular or axillary adenopathy.     Left upper body: No supraclavicular or axillary adenopathy.  Neurological:     General: No focal deficit present.     Mental Status: She is alert and oriented to person, place, and time.  Psychiatric:        Mood and Affect: Mood normal.        Behavior: Behavior normal.     LABS:   CBC    Component Value Date/Time   WBC 2.7 (L) 10/16/2023 1036   WBC 3.9 (L) 04/24/2016 1900   RBC 4.05 10/16/2023 1036   RBC 3.62 (L) 04/24/2016 1900   HGB 13.4 10/16/2023 1036   HCT 39.1 10/16/2023 1036   PLT 230 10/16/2023 1036   MCV 97 10/16/2023 1036   MCH 33.1 (H) 10/16/2023 1036   MCH 34.5 (H) 04/24/2016 1900   MCHC 34.3 10/16/2023 1036   MCHC 36.2 (H) 04/24/2016 1900   RDW 12.9 10/16/2023 1036   LYMPHSABS 0.9 10/16/2023 1036   MONOABS 0.3 04/24/2016 1900   EOSABS 0.0 10/16/2023 1036   BASOSABS 0.0 10/16/2023 1036    CMP    Component Value Date/Time   NA 136 10/16/2023 1036   K 4.4 10/16/2023 1036   CL 99 10/16/2023 1036   CO2 26 10/16/2023 1036   GLUCOSE 83 10/16/2023 1036   GLUCOSE 102 (H) 04/24/2016 1916   BUN 11 10/16/2023 1036   CREATININE 0.70 10/16/2023 1036   CREATININE 0.66 03/26/2013 1228   CALCIUM 9.0 10/16/2023 1036   PROT 5.9 (L) 10/16/2023 1036   ALBUMIN 4.1 10/16/2023 1036   AST 21 10/16/2023 1036  ALT 22 10/16/2023 1036   ALKPHOS 47 10/16/2023 1036   BILITOT 0.3 10/16/2023 1036   GFRNONAA 102 08/11/2020 1633   GFRAA 118 08/11/2020 1633    No results found for: CEA1, CEA / No results found for: CEA1, CEA No results found for: PSA1 No results found for: CAN199 No  results found for: CAN125  No results found for: TOTALPROTELP, ALBUMINELP, A1GS, A2GS, BETS, BETA2SER, GAMS, MSPIKE, SPEI Lab Results  Component Value Date   TIBC 263 05/17/2022   FERRITIN 150 05/17/2022   IRONPCTSAT 37 05/17/2022   No results found for: LDH   STUDIES:   No results found.

## 2023-11-01 ENCOUNTER — Ambulatory Visit
Admission: RE | Admit: 2023-11-01 | Discharge: 2023-11-01 | Disposition: A | Discharge status: Home / Self Care | Payer: 59 | Source: Ambulatory Visit | Attending: Orthopedic Surgery | Admitting: Orthopedic Surgery

## 2023-11-01 ENCOUNTER — Encounter: Payer: Self-pay | Admitting: Hematology

## 2023-11-01 ENCOUNTER — Inpatient Hospital Stay: Payer: 59 | Attending: Hematology | Admitting: Hematology

## 2023-11-01 ENCOUNTER — Inpatient Hospital Stay: Payer: 59

## 2023-11-01 VITALS — BP 114/83 | HR 71 | Temp 97.6°F | Resp 16 | Wt 145.3 lb

## 2023-11-01 DIAGNOSIS — Z79899 Other long term (current) drug therapy: Secondary | ICD-10-CM | POA: Diagnosis not present

## 2023-11-01 DIAGNOSIS — D72819 Decreased white blood cell count, unspecified: Secondary | ICD-10-CM | POA: Insufficient documentation

## 2023-11-01 DIAGNOSIS — M4312 Spondylolisthesis, cervical region: Secondary | ICD-10-CM

## 2023-11-01 LAB — CBC WITH DIFFERENTIAL/PLATELET
Abs Immature Granulocytes: 0.01 10*3/uL (ref 0.00–0.07)
Basophils Absolute: 0 10*3/uL (ref 0.0–0.1)
Basophils Relative: 1 %
Eosinophils Absolute: 0 10*3/uL (ref 0.0–0.5)
Eosinophils Relative: 0 %
HCT: 41.6 % (ref 36.0–46.0)
Hemoglobin: 14.4 g/dL (ref 12.0–15.0)
Immature Granulocytes: 0 %
Lymphocytes Relative: 34 %
Lymphs Abs: 1 10*3/uL (ref 0.7–4.0)
MCH: 32.7 pg (ref 26.0–34.0)
MCHC: 34.6 g/dL (ref 30.0–36.0)
MCV: 94.5 fL (ref 80.0–100.0)
Monocytes Absolute: 0.2 10*3/uL (ref 0.1–1.0)
Monocytes Relative: 8 %
Neutro Abs: 1.6 10*3/uL — ABNORMAL LOW (ref 1.7–7.7)
Neutrophils Relative %: 57 %
Platelets: 256 10*3/uL (ref 150–400)
RBC: 4.4 MIL/uL (ref 3.87–5.11)
RDW: 12.6 % (ref 11.5–15.5)
WBC: 2.9 10*3/uL — ABNORMAL LOW (ref 4.0–10.5)
nRBC: 0 % (ref 0.0–0.2)

## 2023-11-01 LAB — TYPE AND SCREEN
ABO/RH(D): O POS
Antibody Screen: NEGATIVE

## 2023-11-01 LAB — HEPATITIS B CORE ANTIBODY, TOTAL: Hep B Core Total Ab: NONREACTIVE

## 2023-11-01 LAB — HEPATITIS B SURFACE ANTIBODY,QUALITATIVE: Hep B S Ab: NONREACTIVE

## 2023-11-01 LAB — HEPATITIS C ANTIBODY: HCV Ab: NONREACTIVE

## 2023-11-01 LAB — VITAMIN B12: Vitamin B-12: 1095 pg/mL — ABNORMAL HIGH (ref 180–914)

## 2023-11-01 LAB — SEDIMENTATION RATE: Sed Rate: 3 mm/h (ref 0–22)

## 2023-11-01 LAB — HEPATITIS B SURFACE ANTIGEN: Hepatitis B Surface Ag: NONREACTIVE

## 2023-11-01 LAB — HIV ANTIBODY (ROUTINE TESTING W REFLEX): HIV Screen 4th Generation wRfx: NONREACTIVE

## 2023-11-01 LAB — FOLATE: Folate: 11 ng/mL (ref >5.9)

## 2023-11-01 LAB — C-REACTIVE PROTEIN: CRP: 0.5 mg/dL (ref <1.0)

## 2023-11-01 NOTE — Patient Instructions (Signed)
 You were seen and examined today by Dr. Ellin Saba. Dr. Ellin Saba is a hematologist, meaning that he specializes in blood abnormalities. Dr. Ellin Saba discussed your past medical history, family history of cancers/blood conditions and the events that led to you being here today.  You were referred to Dr. Ellin Saba due to leukopenia (low white blood cell count).  Dr. Ellin Saba has recommended additional labs today for further evaluation.  Follow-up as scheduled.

## 2023-11-02 LAB — COPPER, SERUM: Copper: 104 ug/dL (ref 80–158)

## 2023-11-03 DIAGNOSIS — M25562 Pain in left knee: Secondary | ICD-10-CM

## 2023-11-04 LAB — METHYLMALONIC ACID, SERUM: Methylmalonic Acid, Quantitative: 104 nmol/L (ref 0–378)

## 2023-11-06 ENCOUNTER — Encounter: Payer: Self-pay | Admitting: Internal Medicine

## 2023-11-07 ENCOUNTER — Other Ambulatory Visit: Payer: Self-pay | Admitting: Family

## 2023-11-07 ENCOUNTER — Ambulatory Visit: Payer: Self-pay | Admitting: Family Medicine

## 2023-11-07 DIAGNOSIS — M25562 Pain in left knee: Secondary | ICD-10-CM

## 2023-11-07 NOTE — Telephone Encounter (Signed)
 Message from [place] sent at 11/07/2023  4:07 PM EST  Patient called in to have a Medical Necessary letter to exempt her from present and future Court ordered jury duty requests due to her current medical disability not being able to sit down with all of the screws in her body. Can you please provide this letter for the patient so that she can provide this to the courts by November 15, 2023 and she will be able to pick up the letter. It may be her Mother Inocente Sand to pick it up for her in case she has to work from that day. Thank you so much for doing this for her.

## 2023-11-09 ENCOUNTER — Ambulatory Visit (HOSPITAL_COMMUNITY)
Admission: RE | Admit: 2023-11-09 | Discharge: 2023-11-09 | Disposition: A | Discharge status: Home / Self Care | Payer: 59 | Source: Ambulatory Visit | Attending: Hematology | Admitting: Hematology

## 2023-11-09 DIAGNOSIS — D72819 Decreased white blood cell count, unspecified: Secondary | ICD-10-CM | POA: Insufficient documentation

## 2023-11-09 NOTE — Telephone Encounter (Signed)
I have sent a letter to her MyChart to be exempted from [employer].

## 2023-11-09 NOTE — Telephone Encounter (Signed)
Patient aware and verbalized understanding. °

## 2023-11-13 ENCOUNTER — Encounter: Payer: Self-pay | Admitting: Internal Medicine

## 2023-11-13 ENCOUNTER — Other Ambulatory Visit: Payer: Self-pay

## 2023-11-13 ENCOUNTER — Ambulatory Visit (AMBULATORY_SURGERY_CENTER): Payer: 59 | Admitting: Internal Medicine

## 2023-11-13 VITALS — BP 105/56 | HR 82 | Temp 98.2°F | Resp 13 | Ht 63.0 in | Wt 144.0 lb

## 2023-11-13 DIAGNOSIS — D128 Benign neoplasm of rectum: Secondary | ICD-10-CM

## 2023-11-13 DIAGNOSIS — K6289 Other specified diseases of anus and rectum: Secondary | ICD-10-CM

## 2023-11-13 DIAGNOSIS — K621 Rectal polyp: Secondary | ICD-10-CM | POA: Diagnosis not present

## 2023-11-13 DIAGNOSIS — K635 Polyp of colon: Secondary | ICD-10-CM

## 2023-11-13 DIAGNOSIS — K641 Second degree hemorrhoids: Secondary | ICD-10-CM | POA: Diagnosis present

## 2023-11-13 DIAGNOSIS — D125 Benign neoplasm of sigmoid colon: Secondary | ICD-10-CM

## 2023-11-13 DIAGNOSIS — N816 Rectocele: Secondary | ICD-10-CM

## 2023-11-13 MED ORDER — SODIUM CHLORIDE 0.9 % IV SOLN: 500.0000 mL | Freq: Once | INTRAVENOUS | Status: DC

## 2023-11-13 NOTE — Progress Notes (Signed)
GASTROENTEROLOGY PROCEDURE H&P NOTE   Primary Care Physician: Junie Spencer, FNP    Reason for Procedure:   Rectal discomfort, difficulty with evacuating  Plan:    Colonoscopy  Patient is appropriate for endoscopic procedure(s) in the ambulatory ([facility]) setting.  The nature of the procedure, as well as the risks, benefits, and alternatives were carefully and thoroughly reviewed with the patient. Ample time for discussion and questions allowed. The patient understood, was satisfied, and agreed to proceed.     HPI: Maureen [name] is a 58 y.o. female who presents for colonoscopy for evaluation of rectal discomfort.  Patient was most recently seen in the Gastroenterology Clinic on 10/06/23.  No interval change in medical history since that appointment. Please refer to that note for full details regarding GI history and clinical presentation.   Past Medical History:  Diagnosis Date   Abdominal pain    Allergy    Anxiety    takes Xanax daily as needed   Arthritis    Cancer (HCC)    MOLE PRE   Cerebrospinal fluid leak from spinal puncture 08/24/2012   Chronic back pain    Constipation    takes Dulcolax and Miralax daily as needed;also Fiber Pills   Depression    takes Lamictal daily   Fibromyalgia    GERD (gastroesophageal reflux disease)    takes Omeprazole daily   Headache(784.0)    Heart murmur    slight   History of bronchitis    last time many yrs ago(38yrs ago)   Internal hemorrhoid    Interstitial cystitis    no problems since beginning of Jan 2014   Joint pain    Joint swelling    Lupus    takes Plaquenil daily   Muscle spasm    takes Robaxin daily as needed   Numbness and tingling in hands    Osteoarthritis    Pneumonia    hx of > 23yrs ago   Raynaud's disease    Urinary frequency    Urinary urgency     Past Surgical History:  Procedure Laterality Date   ABDOMINAL HERNIA REPAIR     APPENDECTOMY     CESAREAN SECTION     CHOLECYSTECTOMY      COLONOSCOPY     DIAGNOSTIC LAPAROSCOPY     adhesions   ENDOMETRIAL ABLATION     ESOPHAGOGASTRODUODENOSCOPY N/A 05/08/2013   Procedure: ESOPHAGOGASTRODUODENOSCOPY (EGD);  Surgeon: Malissa Hippo, MD;  Location: [facility];  Service: Endoscopy;  Laterality: N/A;  315   NASAL SEPTUM SURGERY     OVARIAN CYST REMOVAL     SHOULDER SURGERY Right    x 2   SPINAL CORD STIMULATOR INSERTION N/A 06/13/2014   Procedure: LUMBAR SPINAL CORD STIMULATOR INSERTION;  Surgeon: Gwynne Edinger, MD;  Location: [facility];  Service: Neurosurgery;  Laterality: N/A;   SPINAL FUSION     x 2    TONSILLECTOMY     TOTAL VAGINAL HYSTERECTOMY  05/2011   TUBAL LIGATION     uterine ablation      Prior to Admission medications   Medication Sig Start Date End Date Taking? Authorizing Provider  baclofen (LIORESAL) 10 MG tablet Take 1 tablet (10 mg total) by mouth 3 (three) times daily. Patient taking differently: Take 10 mg by mouth 3 (three) times daily. PRN 06/09/23   Jannifer Rodney A, FNP  Cholecalciferol (VITAMIN D3) 5000 units CAPS Take 1 tablet by mouth daily.    [provider]  estradiol (VIVELLE-DOT) 0.075 MG/24HR Place 1 patch onto the skin 2 (two) times a week. 01/16/23   Romualdo Bolk, MD  Estradiol 10 MCG TABS vaginal tablet Place 1 tablet (10 mcg total) vaginally 2 (two) times a week. 01/16/23   Romualdo Bolk, MD  famotidine (PEPCID) 20 MG tablet TAKE 1 TABLET BY MOUTH EVERYDAY AT BEDTIME 05/22/23   Meryl Dare, MD  hydrochlorothiazide (HYDRODIURIL) 12.5 MG tablet Take 12.5 mg by mouth as needed. 06/09/22   [provider]  hydrocortisone 2.5 % ointment SMARTSIG:sparingly Topical Twice Daily PRN 10/03/23   [provider]  magnesium oxide (MAG-OX) 400 MG tablet Take 400 mg by mouth daily.    [provider]  Na Sulfate-K Sulfate-Mg Sulf 17.5-3.13-1.6 GM/177ML SOLN Use as directed; may use generic; goodrx card if insurance will not cover generic 10/06/23    Meredith Pel, NP  TURMERIC PO Take 1 tablet by mouth daily.    [provider]    Current Outpatient Medications  Medication Sig Dispense Refill   baclofen (LIORESAL) 10 MG tablet Take 1 tablet (10 mg total) by mouth 3 (three) times daily. (Patient taking differently: Take 10 mg by mouth 3 (three) times daily. PRN) 60 each 2   Cholecalciferol (VITAMIN D3) 5000 units CAPS Take 1 tablet by mouth daily.     estradiol (VIVELLE-DOT) 0.075 MG/24HR Place 1 patch onto the skin 2 (two) times a week. 24 patch 3   Estradiol 10 MCG TABS vaginal tablet Place 1 tablet (10 mcg total) vaginally 2 (two) times a week. 24 tablet 3   famotidine (PEPCID) 20 MG tablet TAKE 1 TABLET BY MOUTH EVERYDAY AT BEDTIME 30 tablet 3   hydrochlorothiazide (HYDRODIURIL) 12.5 MG tablet Take 12.5 mg by mouth as needed.     hydrocortisone 2.5 % ointment SMARTSIG:sparingly Topical Twice Daily PRN     magnesium oxide (MAG-OX) 400 MG tablet Take 400 mg by mouth daily.     Na Sulfate-K Sulfate-Mg Sulf 17.5-3.13-1.6 GM/177ML SOLN Use as directed; may use generic; goodrx card if insurance will not cover generic 354 mL 0   TURMERIC PO Take 1 tablet by mouth daily.     Current Facility-Administered Medications  Medication Dose Route Frequency Provider Last Rate Last Admin   0.9 %  sodium chloride infusion  500 mL Intravenous Once Imogene Burn, MD        Allergies as of 11/13/2023 - Review Complete 11/13/2023  Allergen Reaction Noted   Gabapentin Other (See Comments) 02/22/2022   Amoxicillin Rash 01/29/2014   Penicillins Rash 04/08/2011   Tegretol [carbamazepine] Rash 11/19/2013    Family History  Problem Relation Age of Onset   Hyperlipidemia Mother    Depression Mother    Anxiety disorder Mother    Breast cancer Mother    Lung cancer Mother    Colon polyps Father    Breast cancer Maternal Grandmother    Alzheimer's disease Maternal Grandmother    Ovarian cancer Maternal Aunt    Colon cancer Maternal  Uncle    Colon cancer Paternal Aunt    Esophageal cancer Neg Hx    Pancreatic cancer Neg Hx    Stomach cancer Neg Hx    Rectal cancer Neg Hx     Social History   Socioeconomic History   Marital status: Legally Separated    Spouse name: Not on file   Number of children: Not on file   Years of education: Not on file  Highest education level: Associate degree: academic program  Occupational History   Not on file  Tobacco Use   Smoking status: Former    Current packs/day: 0.00    Average packs/day: 0.5 packs/day for 20.0 years (10.0 ttl pk-yrs)    Types: Cigarettes    Start date: 06/09/1988    Quit date: 06/09/2008    Years since quitting: 15.4   Smokeless tobacco: Never  Vaping Use   Vaping status: Never Used  Substance and Sexual Activity   Alcohol use: Yes    Comment: occassionaly   Drug use: No   Sexual activity: Yes    Partners: Male    Birth control/protection: Surgical  Other Topics Concern   Not on file  Social History Narrative   Not on file   Social Drivers of Health   Financial Resource Strain: Medium Risk (08/31/2023)   Overall Financial Resource Strain (CARDIA)    Difficulty of Paying Living Expenses: Somewhat hard  Food Insecurity: No Food Insecurity (11/01/2023)   Hunger Vital Sign    Worried About Running Out of Food in the Last Year: Never true    Ran Out of Food in the Last Year: Never true  Transportation Needs: No Transportation Needs (11/01/2023)   PRAPARE - Administrator, Civil Service (Medical): No    Lack of Transportation (Non-Medical): No  Physical Activity: Sufficiently Active (08/31/2023)   Exercise Vital Sign    Days of Exercise per Week: 5 days    Minutes of Exercise per Session: 50 min  Stress: Stress Concern Present (08/31/2023)   [employer] of Occupational Health - Occupational Stress Questionnaire    Feeling of Stress : To some extent  Social Connections: Moderately Isolated (08/31/2023)   Social Connection and  Isolation Panel [NHANES]    Frequency of Communication with Friends and Family: More than three times a week    Frequency of Social Gatherings with Friends and Family: Twice a week    Attends Religious Services: More than 4 times per year    Active Member of [employer] or Organizations: No    Attends Banker Meetings: Not on file    Marital Status: Divorced  Intimate Partner Violence: Not At Risk (11/01/2023)   Humiliation, Afraid, Rape, and Kick questionnaire    Fear of Current or Ex-Partner: No    Emotionally Abused: No    Physically Abused: No    Sexually Abused: No    Physical Exam: Vital signs in last 24 hours: There were no vitals taken for this visit. GEN: NAD EYE: Sclerae anicteric ENT: MMM CV: Non-tachycardic Pulm: No increased WOB GI: Soft NEURO:  Alert & Oriented   Eulah Pont, MD [facility]   11/13/2023 9:43 AM

## 2023-11-13 NOTE — Progress Notes (Signed)
Pt's states no medical or surgical changes since previsit or office visit. 

## 2023-11-13 NOTE — Progress Notes (Signed)
Vss nad trans to pacu 

## 2023-11-13 NOTE — Patient Instructions (Addendum)
Resume previous diet Continue present medications Await pathology results Dr Derek Mound office nurse will call you to schedule MRI Hemorrhoid banding to be scheduled in the office to be removed(office nurse will call and schedule this)  Handouts/information given for polyps and hemorrhoids  YOU HAD AN ENDOSCOPIC PROCEDURE TODAY AT THE [facility]:   Refer to the procedure report that was given to you for any specific questions about what was found during the examination.  If the procedure report does not answer your questions, please call your gastroenterologist to clarify.  If you requested that your care partner not be given the details of your procedure findings, then the procedure report has been included in a sealed envelope for you to review at your convenience later.  YOU SHOULD EXPECT: Some feelings of bloating in the abdomen. Passage of more gas than usual.  Walking can help get rid of the air that was put into your GI tract during the procedure and reduce the bloating. If you had a lower endoscopy (such as a colonoscopy or flexible sigmoidoscopy) you may notice spotting of blood in your stool or on the toilet paper. If you underwent a bowel prep for your procedure, you may not have a normal bowel movement for a few days.  Please Note:  You might notice some irritation and congestion in your nose or some drainage.  This is from the oxygen used during your procedure.  There is no need for concern and it should clear up in a day or so.  SYMPTOMS TO REPORT IMMEDIATELY:  Following lower endoscopy (colonoscopy):  Excessive amounts of blood in the stool  Significant tenderness or worsening of abdominal pains  Swelling of the abdomen that is new, acute  Fever of 100F or higher  For urgent or emergent issues, a gastroenterologist can be reached at any hour by calling [phone]. Do not use MyChart messaging for urgent concerns.   DIET:  We do recommend a small meal at first,  but then you may proceed to your regular diet.  Drink plenty of fluids but you should avoid alcoholic beverages for 24 hours.  ACTIVITY:  You should plan to take it easy for the rest of today and you should NOT DRIVE or use heavy machinery until tomorrow (because of the sedation medicines used during the test).    FOLLOW UP: Our staff will call the number listed on your records the next business day following your procedure.  We will call around 7:15- 8:00 am to check on you and address any questions or concerns that you may have regarding the information given to you following your procedure. If we do not reach you, we will leave a message.     If any biopsies were taken you will be contacted by phone or by letter within the next 1-3 weeks.  Please call us at [phone] if you have not heard about the biopsies in 3 weeks.   SIGNATURES/CONFIDENTIALITY: You and/or your care partner have signed paperwork which will be entered into your electronic medical record.  These signatures attest to the fact that that the information above on your After Visit Summary has been reviewed and is understood.  Full responsibility of the confidentiality of this discharge information lies with you and/or your care-partner.

## 2023-11-13 NOTE — Progress Notes (Signed)
Called to room to assist during endoscopic procedure.  Patient ID and intended procedure confirmed with present staff. Received instructions for my participation in the procedure from the performing physician.  

## 2023-11-13 NOTE — Op Note (Addendum)
[facility] Patient Name: Maureen [name] Procedure Date: 11/13/2023 10:10 AM MRN: [id number] Endoscopist: Madelyn Brunner [place] , , [id number] Age: 58 Referring MD:  Date of Birth: 1966/03/23 Gender: Female Account #: [id number] Procedure:                Colonoscopy Indications:              Rectal pain, difficulty passing stools Medicines:                Monitored Anesthesia Care Procedure:                Pre-Anesthesia Assessment:                           - Prior to the procedure, a History and Physical                            was performed, and patient medications and                            allergies were reviewed. The patient's tolerance of                            previous anesthesia was also reviewed. The risks                            and benefits of the procedure and the sedation                            options and risks were discussed with the patient.                            All questions were answered, and informed consent                            was obtained. Prior Anticoagulants: The patient has                            taken no anticoagulant or antiplatelet agents. ASA                            Grade Assessment: II - A patient with mild systemic                            disease. After reviewing the risks and benefits,                            the patient was deemed in satisfactory condition to                            undergo the procedure.                           After obtaining informed consent, the colonoscope  was passed under direct vision. Throughout the                            procedure, the patient's blood pressure, pulse, and                            oxygen saturations were monitored continuously. The                            [id number]-[id number] Colonoscope [id number] was introduced                            through the anus and advanced to the the terminal                            ileum. The  colonoscopy was performed without                            difficulty. The patient tolerated the procedure                            well. The quality of the bowel preparation was                            excellent. The terminal ileum, ileocecal valve,                            appendiceal orifice, and rectum were photographed. Scope In: 10:20:44 AM Scope Out: 10:41:40 AM Scope Withdrawal Time: 0 hours 14 minutes 9 seconds  Total Procedure Duration: 0 hours 20 minutes 56 seconds  Findings:                 The terminal ileum appeared normal.                           Four sessile polyps were found in the rectum and                            sigmoid colon. The polyps were 5 to 10 mm in size.                            These polyps were removed with a cold snare.                            Resection and retrieval were complete.                           Non-bleeding internal hemorrhoids were found during                            retroflexion. The hemorrhoids were Grade II                            (internal hemorrhoids that prolapse but reduce  spontaneously). Complications:            No immediate complications. Estimated Blood Loss:     Estimated blood loss was minimal. Impression:               - The examined portion of the ileum was normal.                           - Four 5 to 10 mm polyps in the rectum and in the                            sigmoid colon, removed with a cold snare. Resected                            and retrieved.                           - Non-bleeding internal hemorrhoids. Recommendation:           - Discharge patient to home (with escort).                           - Await pathology results.                           - Will plan for MR defecography.                           - Since patient has not responded to cream or                            suppositories for the hemorrhoids, will schedule                            her  for hemorrhoidal banding.                           - The findings and recommendations were discussed                            with the patient. Dr Particia Lather "Maureen [name]" [place],  11/13/2023 10:46:55 AM

## 2023-11-14 ENCOUNTER — Telehealth: Payer: Self-pay | Admitting: *Deleted

## 2023-11-14 NOTE — Telephone Encounter (Signed)
  Follow up Call-     11/13/2023    9:44 AM 03/04/2022    9:21 AM  Call back number  Post procedure Call Back phone  # [phone]  Permission to leave phone message Yes Yes     Patient questions:  Do you have a fever, pain , or abdominal swelling? No. Pain Score  0 *  Have you tolerated food without any problems? Yes.    Have you been able to return to your normal activities? Yes.    Do you have any questions about your discharge instructions: Diet   No. Medications  No. Follow up visit  No.  Do you have questions or concerns about your Care? No.  Actions: * If pain score is 4 or above: No action needed, pain <4.

## 2023-11-15 ENCOUNTER — Encounter: Payer: Self-pay | Admitting: Internal Medicine

## 2023-11-15 LAB — SURGICAL PATHOLOGY

## 2023-11-16 ENCOUNTER — Inpatient Hospital Stay: Payer: 59 | Admitting: Oncology

## 2023-11-16 VITALS — BP 111/76 | HR 64 | Temp 97.1°F | Resp 18 | Ht 63.0 in | Wt 143.0 lb

## 2023-11-16 DIAGNOSIS — D72819 Decreased white blood cell count, unspecified: Secondary | ICD-10-CM

## 2023-11-16 NOTE — Progress Notes (Unsigned)
[facility] [address], [place] [address]   Clinic Day:  11/19/2023  Referring physician: Junie Spencer, FNP  Patient Care Team: Junie Spencer, FNP as PCP - General (Family Medicine) Maisie Fus, MD as PCP - Cardiology (Cardiology)   ASSESSMENT & PLAN:   Assessment:  1.  Leukopenia and neutropenia: - CBC (10/16/2023): WBC-2.7, ANC-1.5,N-53%, L-32%, M-12%, B-1% - CTAP (08/31/2020): Spleen was unremarkable.  No adenopathy. - Review of labs in care everywhere shows she has mild leukopenia without significant neutropenia since 2014. - She was treated at [facility] rheumatology for undifferentiated connective tissue disease with features predominantly of lupus.  As per notes from [facility], she was treated with Plaquenil, azathioprine, methotrexate and prednisone.  She stopped treatments as she could not tolerate them well. - No B symptoms or recurrent infections.  Reports fatigue.  2.  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  Quit smoking in 2009.  Smoked half pack per day for 20 years. - Mother had breast cancer and lung cancer in her 70s.  Father had prostate cancer.  Maternal uncle had colon cancer in his 15s.  Paternal aunt had colon cancer.  Plan:  1.  Leukopenia and neutropenia: - She is here to review results for leukopenia.  -She has had low white counts inconsistently over the past 7 or 8 years ranging from as low as 2.7 to normal. -Labs from 11/01/2023 show white blood cell count of 2.9 and neutrophil count of 1.6.  The rest of her differential is unremarkable.  Infectious workup was negative or nonreactive.  She does not appear to have any nutritional deficiencies with normal copper and MMA levels.  Vitamin B12 level is elevated at 1095.  Inflammatory markers are WNL. -Ultrasound from 11/09/2023 shows a normal-sized spleen. -Will discuss with Dr. Ellin Saba plan moving forward versus  observation.  Patient is interested in a bone marrow biopsy because of how awful she feels. 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  Patient would still like to have bone marrow biopsy.  We will get this scheduled and have her come back to clinic a few weeks after her bone marrow to review the results.   Orders Placed This Encounter  Procedures   CT BONE MARROW BIOPSY & ASPIRATION    Standing Status:   Future    Expected Date:   11/24/2023    Expiration Date:   11/16/2024    Reason for Exam (SYMPTOM  OR DIAGNOSIS REQUIRED):   Neutropenia    Is patient pregnant?:   No    Preferred location?:   [facility]    Mauro Kaufmann, NP   1/26/202511:44 AM  CHIEF COMPLAINT/PURPOSE OF CONSULT:   Diagnosis: low WBC count  Current Therapy: Under workup  HISTORY OF PRESENT ILLNESS:   Maureen [name] is a 58 y.o. female presenting to clinic today for follow-up and to review labs for leukopenia.  She was recently seen by Dr. Ellin Saba for her initial visit. She has a history of a low WBC count, previously from 2014 through 2019 per [facility] records and 07/2020 through 12/2021 per [facility] records. Most recently, she had repeat CBC with her PCP on 10/16/23 showing a low WBC count of 2.7, with slightly elevated MCH at 33.1.  Today, she states that she is not feeling well and  really is hoping to have some answers.. Her appetite level is at 100%. Her energy level is at 10%.  She had a colonoscopy on 11/13/2023 which showed 4 sessile polyps in rectum and sigmoid colon ranging from 5 to 10 mm in size.  Nonbleeding internal hemorrhoids were found grade 2 with prolapse but reduce spontaneously.  She is scheduled for hemorrhoidal banding with GI in the near future. She denies any melena or hematochezia but reports occasional hemorrhoidal bleeding.   Reports she is having trouble focusing and has  undiagnosed ADD.  Reports she just does not feel well.  She works full-time and is trying to keep her life together but feels like over the past 2 months she has just felt worse.  She does have follow-up with rheumatology in March which she is hoping will offer her some additional answers.  No recent infections.  Reports she was just given a prescription for amoxicillin for a presumed tooth abscess but they were unable to pull due to low white count. Takes baclofen for muscle body aches. Takes estrogen daily for hot flashes.  When she inserts vaginally once per week and a patch that she applies twice per week.  She is taking over-the-counter supplements that include tumeric, vit d,  magnesium, thiamine and a B complex.  Has history of gastritis with occasional belly pain and diarrhea.  She takes Pepcid occasionally along with omeprazole daily.  Her appetite is decreased.  She denies any weight loss.  Energy levels are very low.  Has family hx of lung, breast and colon cancer.    PAST MEDICAL HISTORY:   Past Medical History: Past Medical History:  Diagnosis Date   Abdominal pain    Allergy    Anxiety    takes Xanax daily as needed   Arthritis    Cancer (HCC)    MOLE PRE   Cerebrospinal fluid leak from spinal puncture 08/24/2012   Chronic back pain    Constipation    takes Dulcolax and Miralax daily as needed;also Fiber Pills   Depression    takes Lamictal daily   Fibromyalgia    GERD (gastroesophageal reflux disease)    takes Omeprazole daily   Headache(784.0)    Heart murmur    slight   History of bronchitis    last time many yrs ago(17yrs ago)   Internal hemorrhoid    Interstitial cystitis    no problems since beginning of Jan 2014   Joint pain    Joint swelling    Lupus    takes Plaquenil daily   Muscle spasm    takes Robaxin daily as needed   Numbness and tingling in hands    Osteoarthritis    Pneumonia    hx of > 57yrs ago   Raynaud's disease    Urinary frequency     Urinary urgency     Surgical History: Past Surgical History:  Procedure Laterality Date   ABDOMINAL HERNIA REPAIR     APPENDECTOMY     CESAREAN SECTION     CHOLECYSTECTOMY     COLONOSCOPY     DIAGNOSTIC LAPAROSCOPY     adhesions   ENDOMETRIAL ABLATION     ESOPHAGOGASTRODUODENOSCOPY N/A 05/08/2013   Procedure: ESOPHAGOGASTRODUODENOSCOPY (EGD);  Surgeon: Malissa Hippo, MD;  Location: [facility];  Service: Endoscopy;  Laterality: N/A;  315   NASAL SEPTUM SURGERY     OVARIAN CYST REMOVAL     SHOULDER SURGERY Right    x 2  SPINAL CORD STIMULATOR INSERTION N/A 06/13/2014   Procedure: LUMBAR SPINAL CORD STIMULATOR INSERTION;  Surgeon: Gwynne Edinger, MD;  Location: [facility]UR[facility] OR[facility];  Service: Neurosurgery;  Laterality: N/A;   SPINAL FUSION     x 2    TONSILLECTOMY     TOTAL VAGINAL HYSTERECTOMY  05/2011   TUBAL LIGATION     uterine ablation      Social History: Social History   Socioeconomic History   Marital status: Legally Separated    Spouse name: Not on file   Number of children: Not on file   Years of education: Not on file   Highest education level: Associate degree: academic program  Occupational History   Not on file  Tobacco Use   Smoking status: Former    Current packs/day: 0.00    Average packs/day: 0.5 packs/day for 20.0 years (10.0 ttl pk-yrs)    Types: Cigarettes    Start date: 06/09/1988    Quit date: 06/09/2008    Years since quitting: 15.4   Smokeless tobacco: Never  Vaping Use   Vaping status: Never Used  Substance and Sexual Activity   Alcohol use: Yes    Comment: occassionaly   Drug use: No   Sexual activity: Yes    Partners: Male    Birth control/protection: Surgical  Other Topics Concern   Not on file  Social History Narrative   Not on file   Social Drivers of Health   Financial Resource Strain: Medium Risk (08/31/2023)   Overall Financial Resource Strain (CARDIA)    Difficulty of Paying Living Expenses: Somewhat hard  Food  Insecurity: No Food Insecurity (11/01/2023)   Hunger Vital Sign    Worried About Running Out of Food in the Last Year: Never true    Ran Out of Food in the Last Year: Never true  Transportation Needs: No Transportation Needs (11/01/2023)   PRAPARE - Administrator, Civil Service (Medical): No    Lack of Transportation (Non-Medical): No  Physical Activity: Sufficiently Active (08/31/2023)   Exercise Vital Sign    Days of Exercise per Week: 5 days    Minutes of Exercise per Session: 50 min  Stress: Stress Concern Present (08/31/2023)   [employer] of Occupational Health - Occupational Stress Questionnaire    Feeling of Stress : To some extent  Social Connections: Moderately Isolated (08/31/2023)   Social Connection and Isolation Panel [NHANES]    Frequency of Communication with Friends and Family: More than three times a week    Frequency of Social Gatherings with Friends and Family: Twice a week    Attends Religious Services: More than 4 times per year    Active Member of [employer] or Organizations: No    Attends Engineer, structural: Not on file    Marital Status: Divorced  Intimate Partner Violence: Not At Risk (11/01/2023)   Humiliation, Afraid, Rape, and Kick questionnaire    Fear of Current or Ex-Partner: No    Emotionally Abused: No    Physically Abused: No    Sexually Abused: No    Family History: Family History  Problem Relation Age of Onset   Hyperlipidemia Mother    Depression Mother    Anxiety disorder Mother    Breast cancer Mother    Lung cancer Mother    Colon polyps Father    Breast cancer Maternal Grandmother    Alzheimer's disease Maternal Grandmother    Ovarian cancer Maternal Aunt    Colon cancer Maternal  Uncle    Colon cancer Paternal Aunt    Esophageal cancer Neg Hx    Pancreatic cancer Neg Hx    Stomach cancer Neg Hx    Rectal cancer Neg Hx     Current Medications:  Current Outpatient Medications:    baclofen (LIORESAL) 10 MG  tablet, Take 1 tablet (10 mg total) by mouth 3 (three) times daily. (Patient taking differently: Take 10 mg by mouth 3 (three) times daily. PRN), Disp: 60 each, Rfl: 2   Cholecalciferol (VITAMIN D3) 5000 units CAPS, Take 1 tablet by mouth daily., Disp: , Rfl:    estradiol (VIVELLE-DOT) 0.075 MG/24HR, Place 1 patch onto the skin 2 (two) times a week., Disp: 24 patch, Rfl: 3   Estradiol 10 MCG TABS vaginal tablet, Place 1 tablet (10 mcg total) vaginally 2 (two) times a week., Disp: 24 tablet, Rfl: 3   famotidine (PEPCID) 20 MG tablet, TAKE 1 TABLET BY MOUTH EVERYDAY AT BEDTIME, Disp: 30 tablet, Rfl: 3   hydrocortisone 2.5 % ointment, SMARTSIG:sparingly Topical Twice Daily PRN, Disp: , Rfl:    magnesium oxide (MAG-OX) 400 MG tablet, Take 400 mg by mouth daily., Disp: , Rfl:    omeprazole (PRILOSEC OTC) 20 MG tablet, Take 20 mg by mouth daily., Disp: , Rfl:    TURMERIC PO, Take 1 tablet by mouth daily., Disp: , Rfl:    Allergies: Allergies  Allergen Reactions   Gabapentin Other (See Comments)    Made paranoia   Amoxicillin Rash   Penicillins Rash    Has patient had a PCN reaction causing immediate rash, facial/tongue/throat swelling, SOB or lightheadedness with hypotension: yes Has patient had a PCN reaction causing severe rash involving mucus membranes or skin necrosis: yes pt did experience rash but it was not severe  Has patient had a PCN reaction that required hospitalization: no Has patient had a PCN reaction occurring within the last 10 years: no If all of the above answers are "NO", then may proceed with Cephalosporin use.    Tegretol [Carbamazepine] Rash    REVIEW OF SYSTEMS:   Review of Systems  Constitutional:  Positive for appetite change and fatigue. Negative for fever.  HENT:   Positive for trouble swallowing. Negative for sore throat.   Gastrointestinal:  Positive for blood in stool (hemmoroids), constipation and nausea. Negative for abdominal pain.  Musculoskeletal:   Positive for arthralgias and myalgias.  Skin:  Positive for rash.  Neurological:  Positive for dizziness and headaches.     VITALS:   Blood pressure 111/76, pulse 64, temperature (!) 97.1 F (36.2 C), temperature source Tympanic, resp. rate 18, height 5\' 3"  (1.6 m), weight 143 lb (64.9 kg), SpO2 100%.  Wt Readings from Last 3 Encounters:  11/16/23 143 lb (64.9 kg)  11/13/23 144 lb (65.3 kg)  11/01/23 145 lb 4.5 oz (65.9 kg)    Body mass index is 25.33 kg/m.   PHYSICAL EXAM:   Physical Exam Vitals and nursing note reviewed. Exam conducted with a chaperone present.  Constitutional:      Appearance: Normal appearance.  Cardiovascular:     Rate and Rhythm: Normal rate and regular rhythm.     Pulses: Normal pulses.     Heart sounds: Normal heart sounds.  Pulmonary:     Effort: Pulmonary effort is normal.     Breath sounds: Normal breath sounds.  Abdominal:     Palpations: Abdomen is soft. There is no hepatomegaly, splenomegaly or mass.     Tenderness: There is  no abdominal tenderness.  Musculoskeletal:     Right lower leg: No edema.     Left lower leg: No edema.  Lymphadenopathy:     Cervical: No cervical adenopathy.     Right cervical: No superficial, deep or posterior cervical adenopathy.    Left cervical: No superficial, deep or posterior cervical adenopathy.     Upper Body:     Right upper body: No supraclavicular or axillary adenopathy.     Left upper body: No supraclavicular or axillary adenopathy.  Neurological:     General: No focal deficit present.     Mental Status: She is alert and oriented to person, place, and time.  Psychiatric:        Mood and Affect: Mood normal.        Behavior: Behavior normal.     LABS:   CBC    Component Value Date/Time   WBC 2.9 (L) 11/01/2023 0911   RBC 4.40 11/01/2023 0911   HGB 14.4 11/01/2023 0911   HGB 13.4 10/16/2023 1036   HCT 41.6 11/01/2023 0911   HCT 39.1 10/16/2023 1036   PLT 256 11/01/2023 0911   PLT 230  10/16/2023 1036   MCV 94.5 11/01/2023 0911   MCV 97 10/16/2023 1036   MCH 32.7 11/01/2023 0911   MCHC 34.6 11/01/2023 0911   RDW 12.6 11/01/2023 0911   RDW 12.9 10/16/2023 1036   LYMPHSABS 1.0 11/01/2023 0911   LYMPHSABS 0.9 10/16/2023 1036   MONOABS 0.2 11/01/2023 0911   EOSABS 0.0 11/01/2023 0911   EOSABS 0.0 10/16/2023 1036   BASOSABS 0.0 11/01/2023 0911   BASOSABS 0.0 10/16/2023 1036    CMP    Component Value Date/Time   NA 136 10/16/2023 1036   K 4.4 10/16/2023 1036   CL 99 10/16/2023 1036   CO2 26 10/16/2023 1036   GLUCOSE 83 10/16/2023 1036   GLUCOSE 102 (H) 04/24/2016 1916   BUN 11 10/16/2023 1036   CREATININE 0.70 10/16/2023 1036   CREATININE 0.66 03/26/2013 1228   CALCIUM 9.0 10/16/2023 1036   PROT 5.9 (L) 10/16/2023 1036   ALBUMIN 4.1 10/16/2023 1036   AST 21 10/16/2023 1036   ALT 22 10/16/2023 1036   ALKPHOS 47 10/16/2023 1036   BILITOT 0.3 10/16/2023 1036   GFRNONAA 102 08/11/2020 1633   GFRAA 118 08/11/2020 1633    No results found for: "CEA1", "CEA" / No results found for: "CEA1", "CEA" No results found for: "PSA1" No results found for: "ZOX096" No results found for: "CAN125"  No results found for: "TOTALPROTELP", "ALBUMINELP", "A1GS", "A2GS", "BETS", "BETA2SER", "GAMS", "MSPIKE", "SPEI" Lab Results  Component Value Date   TIBC 263 05/17/2022   FERRITIN 150 05/17/2022   IRONPCTSAT 37 05/17/2022   No results found for: "LDH"   STUDIES:   US SPLEEN (ABDOMEN LIMITED) Result Date: 11/09/2023 CLINICAL DATA:  Evaluate for splenomegaly. EXAM: ULTRASOUND ABDOMEN LIMITED COMPARISON:  CT abdomen and pelvis August 31, 2020 FINDINGS: The spleen is normal in echotexture and measures 9.6 x 5.6 x 6.2 cm. IMPRESSION: No splenomegaly. Electronically Signed   By: Sherian Rein M.D.   On: 11/09/2023 09:14   MR CERVICAL SPINE WO CONTRAST Result Date: 11/03/2023 CLINICAL DATA:  Initial evaluation for chronic right shoulder pain, cervical spondylosis. EXAM: MRI  CERVICAL SPINE WITHOUT CONTRAST TECHNIQUE: Multiplanar, multisequence MR imaging of the cervical spine was performed. No intravenous contrast was administered. COMPARISON:  Previous MRI from 11/11/2018. FINDINGS: Alignment: Straightening of the normal cervical lordosis. Trace degenerative anterolisthesis of  C3 on C4 and C4 on C5. Trace retrolisthesis of C6 on C7. Findings are stable from prior. Vertebrae: Vertebral body height maintained without acute or chronic fracture. Bone marrow signal intensity within normal limits. No discrete or worrisome osseous lesions. Mild reactive edema present about the left C5-6 facet due to facet arthritis (series 4, image 13). No other significant abnormal marrow edema. Cord: Normal signal and morphology. Posterior Fossa, vertebral arteries, paraspinal tissues: Visualized brain and posterior fossa within normal limits. Craniocervical junction normal. Paraspinous soft tissues within normal limits. Normal flow voids seen within the vertebral arteries bilaterally. 8 mm right thyroid nodule, of doubtful significance given size and patient age, no follow-up imaging recommended (ref: J Am Coll Radiol. 2015 Feb;12(2): 143-50). Disc levels: C2-C3: Normal interspace. Mild left-sided facet spurring. No canal or foraminal stenosis. C3-C4: Trace anterolisthesis with minimal disc bulge. Mild right-sided uncovertebral and facet hypertrophy. No spinal stenosis. Foramina remain patent. C4-C5: Trace anterolisthesis with minimal disc bulge. Mild bilateral facet hypertrophy. No canal or foraminal stenosis. C5-C6: Mild degenerative intervertebral disc space narrowing with diffuse disc bulge and bilateral uncovertebral spurring. Moderate left facet arthrosis. No significant spinal stenosis. Mild to moderate left C6 foraminal narrowing. Right neural foramen remains patent. C6-C7: Mild degenerative disc space narrowing with diffuse disc bulge and bilateral uncovertebral spurring. Mild left-sided facet  hypertrophy. No significant spinal stenosis. Moderate bilateral C7 foraminal narrowing, worse on the left. C7-T1: Negative interspace. Mild bilateral facet hypertrophy. No spinal stenosis. Foramina remain patent. IMPRESSION: 1. Mild degenerative disc bulging with uncovertebral and facet hypertrophy at C5-6 and C6-7 with resultant mild to moderate left C6 and moderate bilateral C7 foraminal stenosis. 2. Mild reactive edema about the left C5-6 facet due to facet arthritis. Finding could serve as a source for neck pain and referred symptoms. Electronically Signed   By: Rise Mu M.D.   On: 11/03/2023 21:00   MR Knee Left  Wo Contrast Result Date: 11/02/2023 CLINICAL DATA:  Left knee pain after fall on 09/01/2023. EXAM: MRI OF THE LEFT KNEE WITHOUT CONTRAST TECHNIQUE: Multiplanar, multisequence MR imaging of the knee was performed. No intravenous contrast was administered. COMPARISON:  None Available. FINDINGS: MENISCI Medial: Intact. Lateral: Intact. LIGAMENTS Cruciates: ACL and PCL are intact. Collaterals: Medial collateral ligament is intact. Lateral collateral ligament complex is intact. CARTILAGE Patellofemoral: No patellar chondral defect. High-grade fissuring of the central trochlear articular cartilage with focal underlying marrow edema. Medial: Moderate thinning of the central weight-bearing articular cartilage of the medial femorotibial compartment with superimposed fibrillation. Lateral:  No chondral defect. JOINT: Trace joint effusion. Polylobulated T2 hyperintense cystic structure within Hoffa's fat pad measuring up to 9 x 9 x 16 mm (AP x TR x CC). No plical thickening. POPLITEAL FOSSA: Popliteus tendon is intact. Small septated Baker's cyst measuring up to 2.9 x 1.1 x 3.5 cm (TR x AP x CC). EXTENSOR MECHANISM: Intact quadriceps tendon. Intact patellar tendon. BONES: No fracture or dislocation.  No suspicious osseous lesion. Other: No subcutaneous fluid collection or hematoma. Muscles are  normal. IMPRESSION: 1. No acute osseous abnormality. 2. No discrete meniscal or ligamentous abnormality identified. 3. A polylobulated cystic structure within Hoffa's fat pad is favored to represent a ganglion cyst. 4. Mild tricompartmental osteoarthritis, most pronounced in the medial femorotibial compartment. Trace joint effusion. 5. Small septated Baker's cyst. Electronically Signed   By: Hart Robinsons M.D.   On: 11/02/2023 16:23

## 2023-11-21 ENCOUNTER — Telehealth: Payer: Self-pay

## 2023-11-21 NOTE — Telephone Encounter (Signed)
Patient called clinic to ask if she should take her prednisone prescription or now, she is having a bone marrow biopsy done next week and was worried it would affect it. Notified MD.   Maureen [name] patient back to let her know to not take the prednisone till after the bone marrow procedure.

## 2023-11-27 ENCOUNTER — Other Ambulatory Visit: Payer: Self-pay | Admitting: Physician Assistant

## 2023-11-27 DIAGNOSIS — Z01818 Encounter for other preprocedural examination: Secondary | ICD-10-CM

## 2023-11-28 ENCOUNTER — Ambulatory Visit (HOSPITAL_COMMUNITY)
Admission: RE | Admit: 2023-11-28 | Discharge: 2023-11-28 | Disposition: A | Discharge status: Home / Self Care | Payer: 59 | Source: Ambulatory Visit | Attending: Oncology | Admitting: Oncology

## 2023-11-28 ENCOUNTER — Encounter (HOSPITAL_COMMUNITY): Payer: Self-pay

## 2023-11-28 ENCOUNTER — Ambulatory Visit (HOSPITAL_COMMUNITY)
Admission: RE | Admit: 2023-11-28 | Discharge: 2023-11-28 | Disposition: A | Discharge status: Home / Self Care | Payer: 59 | Source: Ambulatory Visit | Attending: Oncology

## 2023-11-28 ENCOUNTER — Other Ambulatory Visit: Payer: Self-pay

## 2023-11-28 DIAGNOSIS — D72819 Decreased white blood cell count, unspecified: Secondary | ICD-10-CM

## 2023-11-28 DIAGNOSIS — D709 Neutropenia, unspecified: Secondary | ICD-10-CM | POA: Insufficient documentation

## 2023-11-28 DIAGNOSIS — Z01818 Encounter for other preprocedural examination: Secondary | ICD-10-CM

## 2023-11-28 LAB — CBC WITH DIFFERENTIAL/PLATELET
Abs Immature Granulocytes: 0.02 10*3/uL (ref 0.00–0.07)
Basophils Absolute: 0 10*3/uL (ref 0.0–0.1)
Basophils Relative: 1 %
Eosinophils Absolute: 0.1 10*3/uL (ref 0.0–0.5)
Eosinophils Relative: 1 %
HCT: 42.3 % (ref 36.0–46.0)
Hemoglobin: 14.4 g/dL (ref 12.0–15.0)
Immature Granulocytes: 0 %
Lymphocytes Relative: 31 %
Lymphs Abs: 1.4 10*3/uL (ref 0.7–4.0)
MCH: 32.7 pg (ref 26.0–34.0)
MCHC: 34 g/dL (ref 30.0–36.0)
MCV: 95.9 fL (ref 80.0–100.0)
Monocytes Absolute: 0.4 10*3/uL (ref 0.1–1.0)
Monocytes Relative: 8 %
Neutro Abs: 2.7 10*3/uL (ref 1.7–7.7)
Neutrophils Relative %: 59 %
Platelets: 274 10*3/uL (ref 150–400)
RBC: 4.41 MIL/uL (ref 3.87–5.11)
RDW: 12.4 % (ref 11.5–15.5)
WBC: 4.5 10*3/uL (ref 4.0–10.5)
nRBC: 0 % (ref 0.0–0.2)

## 2023-11-28 MED ORDER — FENTANYL CITRATE (PF) 100 MCG/2ML IJ SOLN
INTRAMUSCULAR | Status: DC | PRN
  Administered 2023-11-28: 50 ug via INTRAVENOUS

## 2023-11-28 MED ORDER — MIDAZOLAM HCL 2 MG/2ML IJ SOLN
INTRAMUSCULAR | Status: AC
Start: 2023-11-28 — End: ?
  Filled 2023-11-28: qty 2

## 2023-11-28 MED ORDER — SODIUM CHLORIDE 0.9% FLUSH: 3.0000 mL | Freq: Two times a day (BID) | INTRAVENOUS | Status: DC

## 2023-11-28 MED ORDER — SODIUM CHLORIDE 0.9% FLUSH: 3.0000 mL | INTRAVENOUS | Status: DC | PRN

## 2023-11-28 MED ORDER — SODIUM CHLORIDE 0.9 % IV SOLN: INTRAVENOUS | Status: DC

## 2023-11-28 MED ORDER — MIDAZOLAM HCL 2 MG/2ML IJ SOLN
INTRAMUSCULAR | Status: DC | PRN
  Administered 2023-11-28 (×2): 1 mg via INTRAVENOUS

## 2023-11-28 MED ORDER — FENTANYL CITRATE (PF) 100 MCG/2ML IJ SOLN
INTRAMUSCULAR | Status: AC
  Filled 2023-11-28: qty 2

## 2023-11-28 NOTE — H&P (Addendum)
 Chief Complaint: Patient was seen in consultation today for  bone marrow biopsy  Referring Physician(s): Burns,Jennifer E  Supervising Physician: Luverne Aran  Patient Status: [facility] - Out-pt  History of Present Illness: Maureen [name] is a 58 y.o. female being worked up for leukopenia/neutropenia  She is referred for bone marrow biopsy.  PMHx, meds, labs, imaging, allergies reviewed. Feels well, no recent fevers, chills, illness. Has been NPO today as directed.   Past Medical History:  Diagnosis Date   Abdominal pain    Allergy    Anxiety    takes Xanax  daily as needed   Arthritis    Cancer (HCC)    MOLE PRE   Cerebrospinal fluid leak from spinal puncture 08/24/2012   Chronic back pain    Constipation    takes Dulcolax and Miralax  daily as needed;also Fiber Pills   Depression    takes Lamictal  daily   Fibromyalgia    GERD (gastroesophageal reflux disease)    takes Omeprazole  daily   Headache(784.0)    Heart murmur    slight   History of bronchitis    last time many yrs ago(33yrs ago)   Internal hemorrhoid    Interstitial cystitis    no problems since beginning of Jan 2014   Joint pain    Joint swelling    Lupus    takes Plaquenil daily   Muscle spasm    takes Robaxin  daily as needed   Numbness and tingling in hands    Osteoarthritis    Pneumonia    hx of > 68yrs ago   Raynaud's disease    Urinary frequency    Urinary urgency     Past Surgical History:  Procedure Laterality Date   ABDOMINAL HERNIA REPAIR     APPENDECTOMY     CESAREAN SECTION     CHOLECYSTECTOMY     COLONOSCOPY     DIAGNOSTIC LAPAROSCOPY     adhesions   ENDOMETRIAL ABLATION     ESOPHAGOGASTRODUODENOSCOPY N/A 05/08/2013   Procedure: ESOPHAGOGASTRODUODENOSCOPY (EGD);  Surgeon: Claudis RAYMOND Rivet, MD;  Location: [facility];  Service: Endoscopy;  Laterality: N/A;  315   NASAL SEPTUM SURGERY     OVARIAN CYST REMOVAL     SHOULDER SURGERY Right    x 2   SPINAL CORD STIMULATOR  INSERTION N/A 06/13/2014   Procedure: LUMBAR SPINAL CORD STIMULATOR INSERTION;  Surgeon: Deward JAYSON Fabian, MD;  Location: [facility]UR[facility] OR[facility];  Service: Neurosurgery;  Laterality: N/A;   SPINAL FUSION     x 2    TONSILLECTOMY     TOTAL VAGINAL HYSTERECTOMY  05/2011   TUBAL LIGATION     uterine ablation      Allergies: Gabapentin , Amoxicillin, Penicillins, and Tegretol [carbamazepine]  Medications: Prior to Admission medications   Medication Sig Start Date End Date Taking? Authorizing Provider  baclofen  (LIORESAL ) 10 MG tablet Take 1 tablet (10 mg total) by mouth 3 (three) times daily. Patient taking differently: Take 10 mg by mouth 3 (three) times daily. PRN 06/09/23   Lavell Lye A, FNP  Cholecalciferol (VITAMIN D3) 5000 units CAPS Take 1 tablet by mouth daily.    [provider]  estradiol  (VIVELLE -DOT) 0.075 MG/24HR Place 1 patch onto the skin 2 (two) times a week. 01/16/23   Jertson, Jill Evelyn, MD  Estradiol  10 MCG TABS vaginal tablet Place 1 tablet (10 mcg total) vaginally 2 (two) times a week. 01/16/23   Jertson, Jill Evelyn, MD  famotidine  (PEPCID ) 20 MG tablet  TAKE 1 TABLET BY MOUTH EVERYDAY AT BEDTIME 05/22/23   Aneita Gwendlyn DASEN, MD  hydrocortisone 2.5 % ointment SMARTSIG:sparingly Topical Twice Daily PRN 10/03/23   [provider]  magnesium oxide (MAG-OX) 400 MG tablet Take 400 mg by mouth daily.    [provider]  omeprazole  (PRILOSEC OTC) 20 MG tablet Take 20 mg by mouth daily.    [provider]  TURMERIC PO Take 1 tablet by mouth daily.    [provider]     Family History  Problem Relation Age of Onset   Hyperlipidemia Mother    Depression Mother    Anxiety disorder Mother    Breast cancer Mother    Lung cancer Mother    Colon polyps Father    Breast cancer Maternal Grandmother    Alzheimer's disease Maternal Grandmother    Ovarian cancer Maternal Aunt    Colon cancer Maternal Uncle    Colon cancer Paternal Aunt     Esophageal cancer Neg Hx    Pancreatic cancer Neg Hx    Stomach cancer Neg Hx    Rectal cancer Neg Hx     Social History   Socioeconomic History   Marital status: Legally Separated    Spouse name: Not on file   Number of children: Not on file   Years of education: Not on file   Highest education level: Associate degree: academic program  Occupational History   Not on file  Tobacco Use   Smoking status: Former    Current packs/day: 0.00    Average packs/day: 0.5 packs/day for 20.0 years (10.0 ttl pk-yrs)    Types: Cigarettes    Start date: 06/09/1988    Quit date: 06/09/2008    Years since quitting: 15.4   Smokeless tobacco: Never  Vaping Use   Vaping status: Never Used  Substance and Sexual Activity   Alcohol use: Yes    Comment: occassionaly   Drug use: No   Sexual activity: Yes    Partners: Male    Birth control/protection: Surgical  Other Topics Concern   Not on file  Social History Narrative   Not on file   Social Drivers of Health   Financial Resource Strain: Medium Risk (08/31/2023)   Overall Financial Resource Strain (CARDIA)    Difficulty of Paying Living Expenses: Somewhat hard  Food Insecurity: No Food Insecurity (11/01/2023)   Hunger Vital Sign    Worried About Running Out of Food in the Last Year: Never true    Ran Out of Food in the Last Year: Never true  Transportation Needs: No Transportation Needs (11/01/2023)   PRAPARE - Administrator, Civil Service (Medical): No    Lack of Transportation (Non-Medical): No  Physical Activity: Sufficiently Active (08/31/2023)   Exercise Vital Sign    Days of Exercise per Week: 5 days    Minutes of Exercise per Session: 50 min  Stress: Stress Concern Present (08/31/2023)   [employer] of Occupational Health - Occupational Stress Questionnaire    Feeling of Stress : To some extent  Social Connections: Moderately Isolated (08/31/2023)   Social Connection and Isolation Panel [NHANES]    Frequency of  Communication with Friends and Family: More than three times a week    Frequency of Social Gatherings with Friends and Family: Twice a week    Attends Religious Services: More than 4 times per year    Active Member of [employer] or Organizations: No    Attends Banker  Meetings: Not on file    Marital Status: Divorced    Review of Systems: A 12 point ROS discussed and pertinent positives are indicated in the HPI above.  All other systems are negative.  Review of Systems  Vital Signs: BP 102/86   Pulse 72   Temp 98.2 F (36.8 C) (Oral)   Resp 18   Ht 5' 3 (1.6 m)   Wt 143 lb 1.3 oz (64.9 kg)   SpO2 100%   BMI 25.35 kg/m   Physical Exam Constitutional:      Appearance: Normal appearance. She is not ill-appearing.  HENT:     Mouth/Throat:     Mouth: Mucous membranes are moist.     Pharynx: Oropharynx is clear.  Cardiovascular:     Rate and Rhythm: Normal rate and regular rhythm.     Heart sounds: Normal heart sounds.  Pulmonary:     Effort: Pulmonary effort is normal. No respiratory distress.     Breath sounds: Normal breath sounds.  Skin:    General: Skin is warm and dry.  Neurological:     General: No focal deficit present.     Mental Status: She is alert and oriented to person, place, and time.  Psychiatric:        Mood and Affect: Mood normal.        Thought Content: Thought content normal.     Imaging: US  SPLEEN (ABDOMEN LIMITED) Result Date: 11/09/2023 CLINICAL DATA:  Evaluate for splenomegaly. EXAM: ULTRASOUND ABDOMEN LIMITED COMPARISON:  CT abdomen and pelvis August 31, 2020 FINDINGS: The spleen is normal in echotexture and measures 9.6 x 5.6 x 6.2 cm. IMPRESSION: No splenomegaly. Electronically Signed   By: Craig Farr M.D.   On: 11/09/2023 09:14   MR CERVICAL SPINE WO CONTRAST Result Date: 11/03/2023 CLINICAL DATA:  Initial evaluation for chronic right shoulder pain, cervical spondylosis. EXAM: MRI CERVICAL SPINE WITHOUT CONTRAST TECHNIQUE:  Multiplanar, multisequence MR imaging of the cervical spine was performed. No intravenous contrast was administered. COMPARISON:  Previous MRI from 11/11/2018. FINDINGS: Alignment: Straightening of the normal cervical lordosis. Trace degenerative anterolisthesis of C3 on C4 and C4 on C5. Trace retrolisthesis of C6 on C7. Findings are stable from prior. Vertebrae: Vertebral body height maintained without acute or chronic fracture. Bone marrow signal intensity within normal limits. No discrete or worrisome osseous lesions. Mild reactive edema present about the left C5-6 facet due to facet arthritis (series 4, image 13). No other significant abnormal marrow edema. Cord: Normal signal and morphology. Posterior Fossa, vertebral arteries, paraspinal tissues: Visualized brain and posterior fossa within normal limits. Craniocervical junction normal. Paraspinous soft tissues within normal limits. Normal flow voids seen within the vertebral arteries bilaterally. 8 mm right thyroid nodule, of doubtful significance given size and patient age, no follow-up imaging recommended (ref: J Am Coll Radiol. 2015 Feb;12(2): 143-50). Disc levels: C2-C3: Normal interspace. Mild left-sided facet spurring. No canal or foraminal stenosis. C3-C4: Trace anterolisthesis with minimal disc bulge. Mild right-sided uncovertebral and facet hypertrophy. No spinal stenosis. Foramina remain patent. C4-C5: Trace anterolisthesis with minimal disc bulge. Mild bilateral facet hypertrophy. No canal or foraminal stenosis. C5-C6: Mild degenerative intervertebral disc space narrowing with diffuse disc bulge and bilateral uncovertebral spurring. Moderate left facet arthrosis. No significant spinal stenosis. Mild to moderate left C6 foraminal narrowing. Right neural foramen remains patent. C6-C7: Mild degenerative disc space narrowing with diffuse disc bulge and bilateral uncovertebral spurring. Mild left-sided facet hypertrophy. No significant spinal stenosis.  Moderate bilateral C7 foraminal  narrowing, worse on the left. C7-T1: Negative interspace. Mild bilateral facet hypertrophy. No spinal stenosis. Foramina remain patent. IMPRESSION: 1. Mild degenerative disc bulging with uncovertebral and facet hypertrophy at C5-6 and C6-7 with resultant mild to moderate left C6 and moderate bilateral C7 foraminal stenosis. 2. Mild reactive edema about the left C5-6 facet due to facet arthritis. Finding could serve as a source for neck pain and referred symptoms. Electronically Signed   By: Morene Hoard M.D.   On: 11/03/2023 21:00    Labs:  CBC: Recent Labs    06/16/23 0855 10/16/23 1036 11/01/23 0911  WBC 3.4 2.7* 2.9*  HGB 13.5 13.4 14.4  HCT 39.4 39.1 41.6  PLT 255 230 256    COAGS: No results for input(s): INR, APTT in the last 8760 hours.  BMP: Recent Labs    06/16/23 0855 10/16/23 1036  NA 139 136  K 3.9 4.4  CL 100 99  CO2 27 26  GLUCOSE 82 83  BUN 12 11  CALCIUM 9.2 9.0  CREATININE 0.78 0.70    LIVER FUNCTION TESTS: Recent Labs    06/16/23 0855 10/16/23 1036  BILITOT 0.3 0.3  AST 18 21  ALT 16 22  ALKPHOS 48 47  PROT 6.4 5.9*  ALBUMIN 4.3 4.1     Assessment and Plan: Leukopenia/Neutropenia For image guided bone marrow biopsy. Risks and benefits of bone marrow bx was discussed with the patient and/or patient's family including, but not limited to bleeding, infection, damage to adjacent structures or low yield requiring additional tests.  All of the questions were answered and there is agreement to proceed.  Consent signed and in chart.    Electronically Signed: Franky Rusk, PA-C 11/28/2023, 10:10 AM   I spent a total of 20 minutes in face to face in clinical consultation, greater than 50% of which was counseling/coordinating care for bone marrow biopsy

## 2023-11-28 NOTE — Procedures (Signed)
 Interventional Radiology Procedure Note  Procedure: CT guided bone marrow aspiration and biopsy  Complications: None  EBL: < 10 mL  Findings: Aspirate and core biopsy performed of bone marrow in right iliac bone.  Plan: Bedrest supine x 1 hrs  Ericha Whittingham T. Fredia Sorrow, M.D Pager:  [phone]

## 2023-11-28 NOTE — Discharge Instructions (Signed)
 Discharge Instructions:   Please call Interventional Radiology clinic [phone] with any questions or concerns.  You may remove your dressing and shower tomorrow.  Discharge Instructions:   Please call Interventional Radiology clinic [phone] with any questions or concerns.  You may remove your dressing and shower tomorrow.     Bone Marrow Aspiration and Bone Marrow Biopsy, Adult, Care After  This sheet gives you information about how to care for yourself after your procedure. Your health care provider may also give you more specific instructions. If you have problems or questions, contact your health care provider. What can I expect after the procedure? After the procedure, it is common to have: Mild pain and tenderness. Swelling. Bruising. Follow these instructions at home: Puncture site care  Follow instructions from your health care provider about how to take care of the puncture site. Make sure you: Wash your hands with soap and water before and after you change your bandage (dressing). If soap and water are not available, use hand sanitizer. Change your dressing as told by your health care provider. Check your puncture site every day for signs of infection. Check for: More redness, swelling, or pain. Fluid or blood. Warmth. Pus or a bad smell. Activity Return to your normal activities as told by your health care provider. Ask your health care provider what activities are safe for you. Do not lift anything that is heavier than 10 lb (4.5 kg), or the limit that you are told, until your health care provider says that it is safe. Do not drive for 24 hours if you were given a sedative during your procedure. General instructions  Take over-the-counter and prescription medicines only as told by your health care provider. Do not take baths, swim, or use a hot tub until your health care provider approves. Ask your health care provider if you may take showers. You may only  be allowed to take sponge baths. If directed, put ice on the affected area. To do this: Put ice in a plastic bag. Place a towel between your skin and the bag. Leave the ice on for 20 minutes, 2-3 times a day. Keep all follow-up visits as told by your health care provider. This is important. Contact a health care provider if: Your pain is not controlled with medicine. You have a fever. You have more redness, swelling, or pain around the puncture site. You have fluid or blood coming from the puncture site. Your puncture site feels warm to the touch. You have pus or a bad smell coming from the puncture site. Summary After the procedure, it is common to have mild pain, tenderness, swelling, and bruising. Follow instructions from your health care provider about how to take care of the puncture site and what activities are safe for you. Take over-the-counter and prescription medicines only as told by your health care provider. Contact a health care provider if you have any signs of infection, such as fluid or blood coming from the puncture site. This information is not intended to replace advice given to you by your health care provider. Make sure you discuss any questions you have with your health care provider. Document Revised: 02/26/2019 Document Reviewed: 02/26/2019 Elsevier Patient Education  2023 Elsevier Inc.     Moderate Conscious Sedation, Adult, Care After  This sheet gives you information about how to care for yourself after your procedure. Your health care provider may also give you more specific instructions. If you have problems or questions, contact your health care  provider. What can I expect after the procedure? After the procedure, it is common to have: Sleepiness for several hours. Impaired judgment for several hours. Difficulty with balance. Vomiting if you eat too soon. Follow these instructions at home: For the time period you were told by your health care  provider: Rest. Do not participate in activities where you could fall or become injured. Do not drive or use machinery. Do not drink alcohol. Do not take sleeping pills or medicines that cause drowsiness. Do not make important decisions or sign legal documents. Do not take care of children on your own. Eating and drinking  Follow the diet recommended by your health care provider. Drink enough fluid to keep your urine pale yellow. If you vomit: Drink water, juice, or soup when you can drink without vomiting. Make sure you have little or no nausea before eating solid foods. General instructions Take over-the-counter and prescription medicines only as told by your health care provider. Have a responsible adult stay with you for the time you are told. It is important to have someone help care for you until you are awake and alert. Do not smoke. Keep all follow-up visits as told by your health care provider. This is important. Contact a health care provider if: You are still sleepy or having trouble with balance after 24 hours. You feel light-headed. You keep feeling nauseous or you keep vomiting. You develop a rash. You have a fever. You have redness or swelling around the IV site. Get help right away if: You have trouble breathing. You have new-onset confusion at home. Summary After the procedure, it is common to feel sleepy, have impaired judgment, or feel nauseous if you eat too soon. Rest after you get home. Know the things you should not do after the procedure. Follow the diet recommended by your health care provider and drink enough fluid to keep your urine pale yellow. Get help right away if you have trouble breathing or new-onset confusion at home. This information is not intended to replace advice given to you by your health care provider. Make sure you discuss any questions you have with your health care provider. Document Revised: 02/07/2020 Document Reviewed:  09/05/2019 Elsevier Patient Education  2023 [employer].

## 2023-11-30 LAB — SURGICAL PATHOLOGY

## 2023-12-02 ENCOUNTER — Ambulatory Visit
Admission: RE | Admit: 2023-12-02 | Discharge: 2023-12-02 | Disposition: A | Discharge status: Home / Self Care | Payer: 59 | Source: Ambulatory Visit | Attending: Internal Medicine | Admitting: Internal Medicine

## 2023-12-02 DIAGNOSIS — K6289 Other specified diseases of anus and rectum: Secondary | ICD-10-CM

## 2023-12-02 DIAGNOSIS — N816 Rectocele: Secondary | ICD-10-CM

## 2023-12-07 ENCOUNTER — Encounter (HOSPITAL_COMMUNITY): Payer: Self-pay | Admitting: Hematology

## 2023-12-08 ENCOUNTER — Encounter (HOSPITAL_COMMUNITY): Payer: Self-pay | Admitting: Hematology

## 2023-12-12 ENCOUNTER — Encounter: Payer: Self-pay | Admitting: Internal Medicine

## 2023-12-12 NOTE — Progress Notes (Unsigned)
 Virtual Visit via Telephone Note  I connected with Maureen [name] on 12/12/23 at 10:00 AM EST by telephone and verified that I am speaking with the correct person using two identifiers.  Location: Patient: Home Provider: Home Office   I discussed the limitations, risks, security and privacy concerns of performing an evaluation and management service by telephone and the availability of in person appointments. I also discussed with the patient that there may be a patient responsible charge related to this service. The patient expressed understanding and agreed to proceed.  History of Present Illness: Maureen [name] is a 58 year old female who is here today for follow-up and discuss bone marrow biopsy for leukopenia.  She was last evaluated on 11/16/2023 by me.  She has a history of low white blood cell count dating back to 2014.  Workup from 11/01/2023 showed persistent low white counts of 2.9 with neutrophil count of 1.6.  Infectious workup was negative or nonreactive.  She does not appear to have any nutritional deficiencies with normal copper and MMA levels.  Vitamin B12 level elevated at 1095.  Inflammatory markers are within normal limits.  Ultrasound from 11/09/2023 shows a normal-sized spleen.  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  She also has past medical history significant for lupus previously on Plaquenil which she stopped taking back in 2020 due to severe photosensitivity.  She recently had a colonoscopy on 11/13/2023 which showed 4 sessile polyps in rectum and sigmoid colon ranging from 5 to 10 mm in size.  Nonbleeding internal hemorrhoids were found grade 2 with prolapse but reduced spontaneously.  She is scheduled for hemorrhoidal banding with GI in the near future.  She denies any melena or hematochezia.  No recurrent hemorrhoidal bleeding.  At her last visit, she reported not feeling well and really was hoping  for some answers.  Reports muscle and body aches, trouble focusing, hot flashes, abdominal pain, diarrhea, low appetite and energy levels.  Reports right before her bone marrow biopsy she was given a cortisone injection into her knee.  She held off on taking the oral prednisone and started after her bone marrow biopsy.  Reports she felt much better following steroids.  Reports she can tell when her white count is low.  States she just feels bad and her sores take longer to heal.  [facility].  Has not been on any medication since 2020.  States she has been avoiding dietary triggers and trying to manage her stress.  Patient had bone marrow biopsy on 11/28/2023 and she is here to review results.    Observations/Objective:Review of Systems  Constitutional:  Positive for malaise/fatigue.  Gastrointestinal:  Positive for abdominal pain and diarrhea. Negative for blood in stool and constipation.  Musculoskeletal:  Positive for myalgias.  Psychiatric/Behavioral:  The patient is nervous/anxious and has insomnia.     Physical Exam Neurological:     Mental Status: She is alert and oriented to person, place, and time.     Assessment and Plan: 1. Leukopenia, unspecified type (Primary) -Patient has had intermittent leukopenia dating back to 2014. -Labs from 11/28/2023 show white blood cell count of 4.5 with normal differential. -Patient had bone marrow biopsy on 11/28/2023 which showed normocellular bone marrow for age with trilineage hematopoiesis.  Minimal neutrophilic left shift.  No significant CD34 positive blastic population identified.  No monoclonal B-cell population or significant T-cell abnormality.  Cytogenetics show a normal female karyotype. -We discussed continuing to monitor her labs for now. 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  Lupus likely playing a part in how she is feeling overall. -Return to clinic in 3  months with labs a few days before.  Follow Up Instructions: -Return to clinic in 3 months with labs a few days before.   I discussed the assessment and treatment plan with the patient. The patient was provided an opportunity to ask questions and all were answered. The patient agreed with the plan and demonstrated an understanding of the instructions.   The patient was advised to call back or seek an in-person evaluation if the symptoms worsen or if the condition fails to improve as anticipated.  I provided 24 minutes of non-face-to-face time during this encounter.   Mauro Kaufmann, NP

## 2023-12-13 ENCOUNTER — Inpatient Hospital Stay: Payer: 59 | Attending: Hematology | Admitting: Oncology

## 2023-12-13 DIAGNOSIS — D72819 Decreased white blood cell count, unspecified: Secondary | ICD-10-CM

## 2023-12-17 ENCOUNTER — Encounter: Payer: Self-pay | Admitting: Internal Medicine

## 2023-12-21 ENCOUNTER — Telehealth: Payer: 59 | Admitting: Family

## 2023-12-21 ENCOUNTER — Encounter: Payer: Self-pay | Admitting: Family

## 2023-12-21 DIAGNOSIS — D72819 Decreased white blood cell count, unspecified: Secondary | ICD-10-CM | POA: Diagnosis not present

## 2023-12-21 DIAGNOSIS — F901 Attention-deficit hyperactivity disorder, predominantly hyperactive type: Secondary | ICD-10-CM

## 2023-12-21 DIAGNOSIS — F411 Generalized anxiety disorder: Secondary | ICD-10-CM

## 2023-12-21 DIAGNOSIS — F3111 Bipolar disorder, current episode manic without psychotic features, mild: Secondary | ICD-10-CM

## 2023-12-21 MED ORDER — LAMOTRIGINE 25 MG PO TABS: 25.0000 mg | ORAL_TABLET | Freq: Every day | ORAL | 1 refills | Status: DC

## 2023-12-21 NOTE — Progress Notes (Signed)
 Virtual Visit Consent   JOLAN MEALOR, you are scheduled for a virtual visit with a [facility] provider today. Just as with appointments in the office, your consent must be obtained to participate. Your consent will be active for this visit and any virtual visit you may have with one of our providers in the next 365 days. If you have a MyChart account, a copy of this consent can be sent to you electronically.  As this is a virtual visit, video technology does not allow for your provider to perform a traditional examination. This may limit your provider's ability to fully assess your condition. If your provider identifies any concerns that need to be evaluated in person or the need to arrange testing (such as labs, EKG, etc.), we will make arrangements to do so. Although advances in technology are sophisticated, we cannot ensure that it will always work on either your end or our end. If the connection with a video visit is poor, the visit may have to be switched to a telephone visit. With either a video or telephone visit, we are not always able to ensure that we have a secure connection.  By engaging in this virtual visit, you consent to the provision of healthcare and authorize for your insurance to be billed (if applicable) for the services provided during this visit. Depending on your insurance coverage, you may receive a charge related to this service.  I need to obtain your verbal consent now. Are you willing to proceed with your visit today? ANNELISE MCCOY has provided verbal consent on 12/21/2023 for a virtual visit (video or telephone). Jannifer Rodney, FNP  Date: 12/21/2023 12:35 PM   Virtual Visit via Video Note   I, Jannifer Rodney, connected with  Maureen [name]  ([id number], 10-Oct-1966) on 12/21/23 at 12:25 PM EST by a video-enabled telemedicine application and verified that I am speaking with the correct person using two identifiers.  Location: Patient: Virtual Visit Location Patient:  Other: car Provider: Virtual Visit Location Provider: Home Office   I discussed the limitations of evaluation and management by telemedicine and the availability of in person appointments. The patient expressed understanding and agreed to proceed.    History of Present Illness: AMANDEEP HOGSTON is a 58 y.o. who identifies as a female who was assigned female at birth, and is being seen today for anxiety, ADHD, and depression has been "off the roof". Reports she has Leukopenia and followed by Hematologists. Has Lupus and back pain.   She reports she has taken Lamictal 75 mg in the past for Bipolar for years. She stopped that three years ago.   She was also diagnosed with ADHD and given ritalin. She could not tolerate this.   HPI: Anxiety Presents for follow-up visit. Symptoms include depressed mood, excessive worry, nervous/anxious behavior and restlessness.    Depression        Associated symptoms include restlessness.  Past medical history includes anxiety.     Problems:  Patient Active Problem List   Diagnosis Date Noted   Leukopenia 10/31/2023   HSV-2 (herpes simplex virus 2) infection 09/29/2023   Primary osteoarthritis 07/19/2022   Bipolar 1 disorder, manic, mild (HCC) 12/28/2021   Aortic atherosclerosis (HCC) 02/08/2021   Vision changes 05/28/2020   Paresthesia 05/28/2020   Cervical spondylosis with radiculopathy 07/01/2016   Spondylolisthesis 07/01/2016   Greater trochanteric bursitis of right hip 08/20/2015   Chronic interstitial cystitis 11/25/2014   H/O Spinal surgery 06/24/2014   Fibrositis 01/29/2014  Fibromyalgia 01/29/2014   Lumbosacral radiculitis 10/09/2013   Lumbar post-laminectomy syndrome 09/17/2013   Atypical chest pain 09/02/2013   Other long term (current) drug therapy 05/01/2013   Arthritis 04/16/2013   Abdominal pain, epigastric 03/26/2013   Back pain, chronic 12/05/2012   Connective tissue disease, undifferentiated (HCC) 12/05/2012   Raynaud's  syndrome without gangrene 12/05/2012   Systemic lupus erythematosus (HCC) 12/05/2012   Raynaud's phenomenon 12/05/2012   Cerebrospinal fluid leak from spinal puncture 08/24/2012   UNSPECIFIED DISORDER OF STOMACH AND DUODENUM 08/26/2008    Allergies:  Allergies  Allergen Reactions   Gabapentin Other (See Comments)    Made paranoia   Amoxicillin Rash   Penicillins Rash    Has patient had a PCN reaction causing immediate rash, facial/tongue/throat swelling, SOB or lightheadedness with hypotension: yes Has patient had a PCN reaction causing severe rash involving mucus membranes or skin necrosis: yes pt did experience rash but it was not severe  Has patient had a PCN reaction that required hospitalization: no Has patient had a PCN reaction occurring within the last 10 years: no If all of the above answers are "NO", then may proceed with Cephalosporin use.    Tegretol [Carbamazepine] Rash   Medications:  Current Outpatient Medications:    lamoTRIgine (LAMICTAL) 25 MG tablet, Take 1 tablet (25 mg total) by mouth daily., Disp: 90 tablet, Rfl: 1   baclofen (LIORESAL) 10 MG tablet, Take 1 tablet (10 mg total) by mouth 3 (three) times daily. (Patient taking differently: Take 10 mg by mouth 3 (three) times daily. PRN), Disp: 60 each, Rfl: 2   Cholecalciferol (VITAMIN D3) 5000 units CAPS, Take 1 tablet by mouth daily., Disp: , Rfl:    estradiol (VIVELLE-DOT) 0.075 MG/24HR, Place 1 patch onto the skin 2 (two) times a week., Disp: 24 patch, Rfl: 3   Estradiol 10 MCG TABS vaginal tablet, Place 1 tablet (10 mcg total) vaginally 2 (two) times a week., Disp: 24 tablet, Rfl: 3   famotidine (PEPCID) 20 MG tablet, TAKE 1 TABLET BY MOUTH EVERYDAY AT BEDTIME, Disp: 30 tablet, Rfl: 3   hydrocortisone 2.5 % ointment, SMARTSIG:sparingly Topical Twice Daily PRN, Disp: , Rfl:    magnesium oxide (MAG-OX) 400 MG tablet, Take 400 mg by mouth daily., Disp: , Rfl:    omeprazole (PRILOSEC OTC) 20 MG tablet, Take 20 mg  by mouth daily., Disp: , Rfl:    TURMERIC PO, Take 1 tablet by mouth daily., Disp: , Rfl:   Observations/Objective: Patient is well-developed, well-nourished in no acute distress.  Resting comfortably   Head is normocephalic, atraumatic.  No labored breathing. Speech is clear and coherent with logical content.  Patient is alert and oriented at baseline.  Anxious  Assessment and Plan: 1. Bipolar 1 disorder, manic, mild (HCC) (Primary) - lamoTRIgine (LAMICTAL) 25 MG tablet; Take 1 tablet (25 mg total) by mouth daily.  Dispense: 90 tablet; Refill: 1  2. GAD (generalized anxiety disorder) - lamoTRIgine (LAMICTAL) 25 MG tablet; Take 1 tablet (25 mg total) by mouth daily.  Dispense: 90 tablet; Refill: 1  3. Attention deficit hyperactivity disorder (ADHD), predominantly hyperactive type  4. Leukopenia, unspecified type  Start Lamictal 25 mg for one month, then increase to 50 mg  Stress management  Follow up in 2 months, once GAD and bipolar stable may add low dose Vyvanse to see if helps with ADHD  Follow up in 2 months   Follow Up Instructions: I discussed the assessment and treatment plan with the patient. The  patient was provided an opportunity to ask questions and all were answered. The patient agreed with the plan and demonstrated an understanding of the instructions.  A copy of instructions were sent to the patient via MyChart unless otherwise noted below.     The patient was advised to call back or seek an in-person evaluation if the symptoms worsen or if the condition fails to improve as anticipated.    Jannifer Rodney, FNP

## 2023-12-21 NOTE — Patient Instructions (Signed)
 Bipolar I Disorder Bipolar I disorder is a mental health disorder in which a person has episodes of emotional highs (mania) and may also have episodes of lows (depression). Bipolar I disorder differs from other bipolar disorders in that it involves extreme episodes of mania (manic episodes). These episodes last at least one week or involve severe symptoms that require a hospital stay to keep the person safe. What are the causes? The cause of this condition is not known. What increases the risk? The following factors may make you more likely to develop this condition: Having a family member with the disorder. Having an imbalance of certain chemicals in the brain (neurotransmitters). Experiencing stress, such as from illness, financial problems, or a death. Having certain conditions that affect the brain or spinal cord (neurologic conditions). Having had a brain injury (trauma). What are the signs or symptoms? Symptoms of bipolar I disorder include the following: Symptoms of mania Very high self-esteem or self-confidence. Less need for sleep. Unusual talkativeness. Speech may be very fast. Racing thoughts with quick shifts between topics that may or may not be related (flight of ideas). Being much more able or much less able to concentrate. Increased purposeful activity, such as work, studies, or social activity. Increased agitation. This could be pacing, squirming, fidgeting, or finger and toe tapping. Impulsive behavior and poor judgment. These may lead to high-risk activities that are sexual, financial, or physical. Symptoms of depression Extreme sadness, uncontrollable crying, or feeling hopeless, worthless, or numb. Sleep problems, such as trouble falling asleep or staying asleep (insomnia), waking early, or sleeping too much. No longer enjoying things you used to enjoy. Isolation, or spending time alone often. Lack of energy or motivation, and moving more slowly than normal. Trouble  making decisions. Increased appetite or loss of appetite. Thoughts of death, or wanting to harm yourself. Sometimes, you may have a mixed mood. This means having symptoms of mania and depression at the same time. Stress can often trigger these symptoms. How is this diagnosed? This condition may be diagnosed based on a mental health evaluation that includes a review of: Emotional episodes. Medical history. Use of alcohol, drugs, and prescription medicines. Certain medical conditions and substances can cause secondary bipolar disorder. This has symptoms like the symptoms of bipolar disorder. Your health care provider may ask you to take a short test to help understand your symptoms. You may also be asked to follow up with a mental health provider for further evaluation or to start treatment. How is this treated?     This condition is a long-term (chronic) illness. It is often managed with ongoing treatment rather than treatment only when symptoms occur. A combination of treatments is often used. Treatment may include: Medicines, usually medicines called mood stabilizers. Medicines can be prescribed by a health care provider who specializes in treating mental health disorders (psychiatrist). If symptoms occur during use of a mood stabilizer, other medicines may be added. Talk therapy (psychotherapy) to help you manage bipolar disorder. Talk therapy includes cognitive behavioral therapy (CBT) and family therapy. Psychoeducation. This helps you and others understand how your disorder is managed. Include friends and family in educational sessions so they learn how best to support you. Methods of managing your condition, such as journaling or relaxation exercises. Exercises include: Yoga. Meditation. Deep breathing. Lifestyle changes, such as: Limiting alcohol and drug use. Exercising regularly. Setting a regular bedtime and wake time. Eating a healthy diet. Electroconvulsive therapy (ECT). This  is a procedure in which electricity is  applied to the brain through the scalp. ECT may be used for severe bipolar disorder when medicine and psychotherapy work too slowly or do not work. Follow these instructions at home: Activity Return to your normal activities as told by your health care provider. Find activities that you enjoy, and make time to do them. Get regular exercise. Lifestyle  Follow a set schedule for eating and sleeping. Eat a healthy diet that includes fresh fruits and vegetables, whole grains, low-fat dairy, and lean meat. Get at least 7-8 hours of sleep each night. Avoid using products that contain nicotine or tobacco. If you want help quitting, ask your health care provider. Avoid alcohol and drugs. They can affect how medicine works and make symptoms worse. General instructions Take over-the-counter and prescription medicines only as told by your health care provider. You may think about stopping your medicine, but you need to take all your medicine as prescribed. This helps manage your symptoms. Consider joining a support group. Your health care provider may be able to recommend one. Talk with your family and friends about your treatment goals and how loved ones can help. Keep all follow-up visits. This is important. Where to find more information [employer] on Mental Illness: nami.[employer] of Mental Health: BloggerCourse.com Contact a health care provider if: Your symptoms get worse, or your loved ones tell you that your symptoms are getting worse. You have uncomfortable side effects from your medicine. You have trouble sleeping. You have trouble doing daily activities. You feel unsafe in your surroundings. You are using alcohol or drugs to manage your symptoms. Get help right away if: You have new symptoms. You have thoughts about harming yourself or others. You are considering suicide. If you ever feel like you may hurt yourself or others, or  have thoughts about taking your own life, get help right away. Go to your nearest emergency department or: Call your local emergency services (911 in the [place] Call a suicide crisis helpline, such as the National Suicide Prevention Lifeline at 254-498-7898 or 988 in the [place] This is open 24 hours a day in the [place] If you're a Veteran: Call 988 and press 1. This is open 24 hours a day. Text the [employer] at 661-771-8954. Summary Bipolar I disorder is a lifelong mental health disorder in which a person has episodes of mania and depression. Treatment for this disorder involves a combination of treatments such as medicines and talk and behavioral therapies. Include friends and family in educational sessions so they know how best to support you. Get help right away if you are considering suicide. This information is not intended to replace advice given to you by your health care provider. Make sure you discuss any questions you have with your health care provider. Document Revised: 05/25/2023 Document Reviewed: 04/01/2021 Elsevier Patient Education  2024 [employer].

## 2024-01-08 ENCOUNTER — Other Ambulatory Visit: Payer: Self-pay

## 2024-01-08 MED ORDER — FAMOTIDINE 20 MG PO TABS: 20.0000 mg | ORAL_TABLET | Freq: Every day | ORAL | 3 refills | Status: DC

## 2024-01-15 ENCOUNTER — Encounter: Payer: Self-pay | Admitting: Internal Medicine

## 2024-01-15 ENCOUNTER — Ambulatory Visit: Payer: 59 | Admitting: Internal Medicine

## 2024-01-15 VITALS — BP 120/84 | HR 80 | Ht 63.0 in | Wt 139.1 lb

## 2024-01-15 DIAGNOSIS — K649 Unspecified hemorrhoids: Secondary | ICD-10-CM

## 2024-01-15 DIAGNOSIS — K641 Second degree hemorrhoids: Secondary | ICD-10-CM | POA: Diagnosis not present

## 2024-01-15 NOTE — Progress Notes (Signed)
 PROCEDURE NOTE: The patient presents with symptomatic grade 2  hemorrhoids, requesting rubber band ligation of his/her hemorrhoidal disease.  All risks, benefits and alternative forms of therapy were described and informed consent was obtained.  In the Left Lateral Decubitus position anoscopic examination revealed grade 2 hemorrhoids. The anorectum was pre-medicated with Recticare and nitroglycerin The decision was made to band the right anterior internal hemorrhoid, and the [facility] O'Regan System was used to perform band ligation without complication.  Digital anorectal examination was then performed to assure proper positioning of the band, and to adjust the banded tissue as required.  The patient was discharged home without pain or other issues.  Dietary and behavioral recommendations were given and along with follow-up instructions.    The patient will return in 2 weeks for  follow-up (since patient has upcoming rectopexy, sacrocolpopexy, possible sling on 02/19/24) and possible additional banding as required.  Family history of advanced colon polyps in her father and mother. Will need to change to 5 years No complications were encountered and the patient tolerated the procedure well.

## 2024-01-15 NOTE — Patient Instructions (Signed)
 HEMORRHOID BANDING PROCEDURE    FOLLOW-UP CARE   The procedure you have had should have been relatively painless since the banding of the area involved does not have nerve endings and there is no pain sensation.  The rubber band cuts off the blood supply to the hemorrhoid and the band may fall off as soon as 48 hours after the banding (the band may occasionally be seen in the toilet bowl following a bowel movement). You may notice a temporary feeling of fullness in the rectum which should respond adequately to plain Tylenol or Motrin.  Following the banding, avoid strenuous exercise that evening and resume full activity the next day.  A sitz bath (soaking in a warm tub) or bidet is soothing, and can be useful for cleansing the area after bowel movements.     To avoid constipation, take two tablespoons of natural wheat bran, natural oat bran, flax, Benefiber or any over the counter fiber supplement and increase your water intake to 7-8 glasses daily.    Unless you have been prescribed anorectal medication, do not put anything inside your rectum for two weeks: No suppositories, enemas, fingers, etc.  Occasionally, you may have more bleeding than usual after the banding procedure.  This is often from the untreated hemorrhoids rather than the treated one.  Don't be concerned if there is a tablespoon or so of blood.  If there is more blood than this, lie flat with your bottom higher than your head and apply an ice pack to the area. If the bleeding does not stop within a half an hour or if you feel faint, call our office at [phone] or go to the emergency room.  Problems are not common; however, if there is a substantial amount of bleeding, severe pain, chills, fever or difficulty passing urine (very rare) or other problems, you should call us at [phone] or report to the nearest emergency room.  Do not stay seated continuously for more than 2-3 hours for a day or two after the procedure.   Tighten your buttock muscles 10-15 times every two hours and take 10-15 deep breaths every 1-2 hours.  Do not spend more than a few minutes on the toilet if you cannot empty your bowel; instead re-visit the toilet at a later time.   Thank you for entrusting me with your care and for choosing [facility],  Dr. Eulah Pont

## 2024-01-31 ENCOUNTER — Ambulatory Visit: Admitting: Internal Medicine

## 2024-01-31 ENCOUNTER — Encounter: Payer: Self-pay | Admitting: Internal Medicine

## 2024-01-31 VITALS — BP 118/70 | HR 60 | Ht 63.0 in | Wt 142.0 lb

## 2024-01-31 DIAGNOSIS — K641 Second degree hemorrhoids: Secondary | ICD-10-CM | POA: Diagnosis not present

## 2024-01-31 DIAGNOSIS — K649 Unspecified hemorrhoids: Secondary | ICD-10-CM

## 2024-01-31 NOTE — Patient Instructions (Signed)
 HEMORRHOID BANDING PROCEDURE    FOLLOW-UP CARE   The procedure you have had should have been relatively painless since the banding of the area involved does not have nerve endings and there is no pain sensation.  The rubber band cuts off the blood supply to the hemorrhoid and the band may fall off as soon as 48 hours after the banding (the band may occasionally be seen in the toilet bowl following a bowel movement). You may notice a temporary feeling of fullness in the rectum which should respond adequately to plain Tylenol or Motrin.  Following the banding, avoid strenuous exercise that evening and resume full activity the next day.  A sitz bath (soaking in a warm tub) or bidet is soothing, and can be useful for cleansing the area after bowel movements.     To avoid constipation, take two tablespoons of natural wheat bran, natural oat bran, flax, Benefiber or any over the counter fiber supplement and increase your water intake to 7-8 glasses daily.    Unless you have been prescribed anorectal medication, do not put anything inside your rectum for two weeks: No suppositories, enemas, fingers, etc.  Occasionally, you may have more bleeding than usual after the banding procedure.  This is often from the untreated hemorrhoids rather than the treated one.  Don't be concerned if there is a tablespoon or so of blood.  If there is more blood than this, lie flat with your bottom higher than your head and apply an ice pack to the area. If the bleeding does not stop within a half an hour or if you feel faint, call our office at [phone] or go to the emergency room.  Problems are not common; however, if there is a substantial amount of bleeding, severe pain, chills, fever or difficulty passing urine (very rare) or other problems, you should call us at [phone] or report to the nearest emergency room.  Do not stay seated continuously for more than 2-3 hours for a day or two after the procedure.   Tighten your buttock muscles 10-15 times every two hours and take 10-15 deep breaths every 1-2 hours.  Do not spend more than a few minutes on the toilet if you cannot empty your bowel; instead re-visit the toilet at a later time.   Thank you for entrusting me with your care and for choosing [facility],  Dr. Eulah Pont

## 2024-01-31 NOTE — Progress Notes (Signed)
 PROCEDURE NOTE: The patient presents with symptomatic grade 2 hemorrhoids, requesting rubber band ligation of his/her hemorrhoidal disease.  All risks, benefits and alternative forms of therapy were described and informed consent was obtained.  In the Left Lateral Decubitus position anoscopic examination revealed grade 2 hemorrhoids (not prolapsed on today's exam) with some skin tags The anorectum was pre-medicated with nitroglycerin and Recticare The decision was made to band the posterior internal hemorrhoid, and the [facility] O'Regan System was used to perform band ligation without complication.  Digital anorectal examination was then performed to assure proper positioning of the band, and to adjust the banded tissue as required.  The patient was discharged home without pain or other issues.  Dietary and behavioral recommendations were given and along with follow-up instructions.     The following adjunctive treatments were recommended: Continue to take laxatives to induce regular BMs.  The patient will return in 2 weeks for  follow-up (before her surgery scheduled on 4/28) and possible additional banding as required. No complications were encountered and the patient tolerated the procedure well.

## 2024-02-02 ENCOUNTER — Ambulatory Visit: Admitting: Family

## 2024-02-02 VITALS — BP 111/71 | HR 68 | Temp 97.9°F | Ht 63.0 in | Wt 143.0 lb

## 2024-02-02 DIAGNOSIS — M329 Systemic lupus erythematosus, unspecified: Secondary | ICD-10-CM

## 2024-02-02 DIAGNOSIS — F3111 Bipolar disorder, current episode manic without psychotic features, mild: Secondary | ICD-10-CM | POA: Diagnosis not present

## 2024-02-02 DIAGNOSIS — F902 Attention-deficit hyperactivity disorder, combined type: Secondary | ICD-10-CM

## 2024-02-02 DIAGNOSIS — I700 Atherosclerosis of aorta: Secondary | ICD-10-CM | POA: Diagnosis not present

## 2024-02-02 DIAGNOSIS — G8929 Other chronic pain: Secondary | ICD-10-CM

## 2024-02-02 DIAGNOSIS — M545 Low back pain, unspecified: Secondary | ICD-10-CM

## 2024-02-02 MED ORDER — LAMOTRIGINE ER 50 MG PO TB24
50.0000 mg | ORAL_TABLET | Freq: Every day | ORAL | 1 refills | Status: DC
Start: 2024-02-02 — End: 2024-02-13

## 2024-02-02 MED ORDER — METHYLPHENIDATE HCL 5 MG PO TABS: 5.0000 mg | ORAL_TABLET | Freq: Two times a day (BID) | ORAL | 0 refills | Status: DC

## 2024-02-02 NOTE — Progress Notes (Signed)
 Subjective:    Patient ID: Maureen [name], female    DOB: May 08, 1966, 58 y.o.   MRN: [id number]  Chief Complaint  Patient presents with   Medical Management of Chronic Issues   Pt presents to the office today for chronic follow up. She is followed by Gyn annually.   She has lupus and followed by Rheumatologists every 6 months.   She has aortic atherosclerosis, but does not take statin.   She has been diagnosed with Bipolar in the past and was started on Lamictal on our last visit. This has helped her with anxiety and depression. However, her ADHD is still causing her problems at work. She reports she can not complete task, can not focus, and gets overwhelmed. She is struggling at work. She has taken Methylphenidate in the past for a week, because she got anxious. Arthritis Presents for follow-up visit. She complains of pain and stiffness. Affected locations include the right knee, left knee, left MCP, right MCP, left hip and right hip.  Back Pain This is a chronic problem. The current episode started more than 1 year ago. The problem occurs intermittently. The problem has been waxing and waning since onset. The pain is present in the lumbar spine. The quality of the pain is described as aching. The pain is moderate. She has tried home exercises and muscle relaxant for the symptoms. The treatment provided mild relief.      Review of Systems  Musculoskeletal:  Positive for back pain and stiffness.  All other systems reviewed and are negative.      Objective:   Physical Exam Vitals reviewed.  Constitutional:      General: She is not in acute distress.    Appearance: She is well-developed.  HENT:     Head: Normocephalic and atraumatic.     Right Ear: Tympanic membrane normal.     Left Ear: Tympanic membrane normal.  Eyes:     Pupils: Pupils are equal, round, and reactive to light.  Neck:     Thyroid: No thyromegaly.  Cardiovascular:     Rate and Rhythm: Normal rate and  regular rhythm.     Heart sounds: Normal heart sounds. No murmur heard. Pulmonary:     Effort: Pulmonary effort is normal. No respiratory distress.     Breath sounds: Normal breath sounds. No wheezing.  Abdominal:     General: Bowel sounds are normal. There is no distension.     Palpations: Abdomen is soft.     Tenderness: There is no abdominal tenderness.  Musculoskeletal:        General: No tenderness. Normal range of motion.     Cervical back: Normal range of motion and neck supple.     Comments: Full ROM, pain in lumbar with flexion  Skin:    General: Skin is warm and dry.  Neurological:     Mental Status: She is alert and oriented to person, place, and time.     Cranial Nerves: No cranial nerve deficit.     Deep Tendon Reflexes: Reflexes are normal and symmetric.  Psychiatric:        Behavior: Behavior normal.        Thought Content: Thought content normal.        Judgment: Judgment normal.          BP 111/71   Pulse 68   Temp 97.9 F (36.6 C)   Ht 5\' 3"  (1.6 m)   Wt 143 lb (64.9 kg)  SpO2 99%   BMI 25.33 kg/m   Assessment & Plan:   WINTA BARCELO comes in today with chief complaint of Medical Management of Chronic Issues   Diagnosis and orders addressed:  1. Systemic lupus erythematosus, unspecified SLE type, unspecified organ involvement status (HCC) (Primary)  2. Bipolar 1 disorder, manic, mild (HCC) - lamoTRIgine (LAMICTAL XR) 50 MG 24 hour tablet; Take 1 tablet (50 mg total) by mouth daily.  Dispense: 90 tablet; Refill: 1  3. Attention deficit hyperactivity disorder (ADHD), combined type - methylphenidate (RITALIN) 5 MG tablet; Take 1 tablet (5 mg total) by mouth 2 (two) times daily.  Dispense: 60 tablet; Refill: 0  4. Aortic atherosclerosis (HCC)  5. Chronic bilateral low back pain without sciatica  Will increase Lamictal to 50 mg from 25 mg  Will add Ritalin 5 mg BID, she will start on 2.5 mg BID and slowly increase as she is worried about  adverse effects.  Health Maintenance reviewed Diet and exercise encouraged  Follow up plan: 1 month   Jannifer Rodney, FNP

## 2024-02-02 NOTE — Patient Instructions (Signed)

## 2024-02-13 ENCOUNTER — Ambulatory Visit: Admitting: Internal Medicine

## 2024-02-13 ENCOUNTER — Encounter: Payer: Self-pay | Admitting: Internal Medicine

## 2024-02-13 VITALS — BP 100/60 | HR 69 | Ht 63.0 in | Wt 142.0 lb

## 2024-02-13 DIAGNOSIS — K649 Unspecified hemorrhoids: Secondary | ICD-10-CM

## 2024-02-13 DIAGNOSIS — K641 Second degree hemorrhoids: Secondary | ICD-10-CM | POA: Diagnosis not present

## 2024-02-13 NOTE — Patient Instructions (Signed)
Follow up as needed   [place]   The procedure you have had should have been relatively painless since the banding of the area involved does not have nerve endings and there is no pain sensation.  The rubber band cuts off the blood supply to the hemorrhoid and the band may fall off as soon as 48 hours after the banding (the band may occasionally be seen in the toilet bowl following a bowel movement). You may notice a temporary feeling of fullness in the rectum which should respond adequately to plain Tylenol or Motrin.  Following the banding, avoid strenuous exercise that evening and resume full activity the next day.  A sitz bath (soaking in a warm tub) or bidet is soothing, and can be useful for cleansing the area after bowel movements.     To avoid constipation, take two tablespoons of natural wheat bran, natural oat bran, flax, Benefiber or any over the counter fiber supplement and increase your water intake to 7-8 glasses daily.    Unless you have been prescribed anorectal medication, do not put anything inside your rectum for two weeks: No suppositories, enemas, fingers, etc.  Occasionally, you may have more bleeding than usual after the banding procedure.  This is often from the untreated hemorrhoids rather than the treated one.  Don't be concerned if there is a tablespoon or so of blood.  If there is more blood than this, lie flat with your bottom higher than your head and apply an ice pack to the area. If the bleeding does not stop within a half an hour or if you feel faint, call our office at [phone] or go to the emergency room.  Problems are not common; however, if there is a substantial amount of bleeding, severe pain, chills, fever or difficulty passing urine (very rare) or other problems, you should call us at [phone] or report to the nearest emergency room.  Do not stay seated continuously for more than 2-3 hours for a day or  two after the procedure.  Tighten your buttock muscles 10-15 times every two hours and take 10-15 deep breaths every 1-2 hours.  Do not spend more than a few minutes on the toilet if you cannot empty your bowel; instead re-visit the toilet at a later time.  Thank you for entrusting me with your care and for choosing [facility], Dr. Christia Reading

## 2024-02-13 NOTE — Progress Notes (Signed)
 PROCEDURE NOTE: The patient presents with symptomatic grade 2  hemorrhoids, requesting rubber band ligation of his/her hemorrhoidal disease.  All risks, benefits and alternative forms of therapy were described and informed consent was obtained.  In the Left Lateral Decubitus position anoscopic examination revealed grade 2 hemorrhoids in the left anterior position(s).  The anorectum was pre-medicated with nitroglycerin and Recticare The decision was made to band the left anterior internal hemorrhoid, and the [facility] System was used to perform band ligation without complication.  Digital anorectal examination was then performed to assure proper positioning of the band, and to adjust the banded tissue as required.  The patient was discharged home without pain or other issues.  Dietary and behavioral recommendations were given and along with follow-up instructions.    No complications were encountered and the patient tolerated the procedure well.  Patient has rectopexy and sacrocolpoxy scheduled next week.

## 2024-02-14 ENCOUNTER — Other Ambulatory Visit: Payer: Self-pay | Admitting: *Deleted

## 2024-02-14 ENCOUNTER — Encounter: Payer: Self-pay | Admitting: *Deleted

## 2024-02-14 DIAGNOSIS — Z5181 Encounter for therapeutic drug level monitoring: Secondary | ICD-10-CM

## 2024-02-14 MED ORDER — ESTRADIOL 0.075 MG/24HR TD PTTW: 1.0000 | MEDICATED_PATCH | TRANSDERMAL | 1 refills | Status: DC

## 2024-02-14 NOTE — Telephone Encounter (Signed)
 Per review of EPIC, Hx of TVH 05/2011.   Patient notified.

## 2024-02-14 NOTE — Telephone Encounter (Signed)
 Patient left message on triage line requesting refill of estradiol  patch. Patient states she is out of medication, has tried to wean, unsuccessful. States she is scheduled for "multiorgan prolapse" surgery on Monday.   Med refill request:estradiol  0.075 mg patch  Last AEX: 01/13/23/JJ; OV 09/26/23/GH Next AEX: 03/25/24 -EB Last MMG (if hormonal med) 08/24/23; BiRads 1 neg  Routing to Dr. Tia Flowers to review and advise on Rx refill Refill authorized: Please Advise?

## 2024-03-06 ENCOUNTER — Other Ambulatory Visit: Payer: Self-pay | Admitting: Obstetrics and Gynecology

## 2024-03-06 DIAGNOSIS — Z5181 Encounter for therapeutic drug level monitoring: Secondary | ICD-10-CM

## 2024-03-07 ENCOUNTER — Encounter: Payer: Self-pay | Admitting: Family

## 2024-03-07 ENCOUNTER — Telehealth: Admitting: Family

## 2024-03-07 DIAGNOSIS — F902 Attention-deficit hyperactivity disorder, combined type: Secondary | ICD-10-CM

## 2024-03-07 DIAGNOSIS — F3111 Bipolar disorder, current episode manic without psychotic features, mild: Secondary | ICD-10-CM | POA: Diagnosis not present

## 2024-03-07 MED ORDER — METHYLPHENIDATE HCL 10 MG PO TABS: 10.0000 mg | ORAL_TABLET | Freq: Two times a day (BID) | ORAL | 0 refills | Status: DC

## 2024-03-07 MED ORDER — METHYLPHENIDATE HCL 10 MG PO TABS
10.0000 mg | ORAL_TABLET | Freq: Two times a day (BID) | ORAL | 0 refills | Status: DC
Start: 2024-05-05 — End: 2024-06-07

## 2024-03-07 NOTE — Progress Notes (Signed)
 Virtual Visit Consent   WHITTANY AKRIDGE, you are scheduled for a virtual visit with a [facility] provider today. Just as with appointments in the office, your consent must be obtained to participate. Your consent will be active for this visit and any virtual visit you may have with one of our providers in the next 365 days. If you have a MyChart account, a copy of this consent can be sent to you electronically.  As this is a virtual visit, video technology does not allow for your provider to perform a traditional examination. This may limit your provider's ability to fully assess your condition. If your provider identifies any concerns that need to be evaluated in person or the need to arrange testing (such as labs, EKG, etc.), we will make arrangements to do so. Although advances in technology are sophisticated, we cannot ensure that it will always work on either your end or our end. If the connection with a video visit is poor, the visit may have to be switched to a telephone visit. With either a video or telephone visit, we are not always able to ensure that we have a secure connection.  By engaging in this virtual visit, you consent to the provision of healthcare and authorize for your insurance to be billed (if applicable) for the services provided during this visit. Depending on your insurance coverage, you may receive a charge related to this service.  I need to obtain your verbal consent now. Are you willing to proceed with your visit today? AITHANA GUIN has provided verbal consent on 03/07/2024 for a virtual visit (video or telephone). Tommas Fragmin, FNP  Date: 03/07/2024 8:46 AM   Virtual Visit via Video Note   I, Tommas Fragmin, connected with  YARLIN LAUREANO  ([id number], May 16, 1966) on 03/07/24 at  8:25 AM EDT by a video-enabled telemedicine application and verified that I am speaking with the correct person using two identifiers.  Location: Patient: Virtual Visit Location Patient:  Home Provider: Virtual Visit Location Provider: Home Office   I discussed the limitations of evaluation and management by telemedicine and the availability of in person appointments. The patient expressed understanding and agreed to proceed.    History of Present Illness: OCTAVIA METRO is a 58 y.o. who identifies as a female who was assigned female at birth, and is being seen today for to recheck Bipolar and ADHD. She was seen on 02/02/24 and we increased her Lamictal  to 50 mg from 25 mg and added Ritalin  5 mg BID.   She reports the Lamictal  made her more sensitive to the sun and she has stopped this completely.   She has been taking the Ritalin  5 mg BID. Reports this has helped    HPI: HPI  Problems:  Patient Active Problem List   Diagnosis Date Noted   Attention deficit hyperactivity disorder (ADHD), combined type 03/07/2024   Leukopenia 10/31/2023   HSV-2 (herpes simplex virus 2) infection 09/29/2023   Primary osteoarthritis 07/19/2022   Bipolar 1 disorder, manic, mild (HCC) 12/28/2021   Aortic atherosclerosis (HCC) 02/08/2021   Vision changes 05/28/2020   Paresthesia 05/28/2020   Cervical spondylosis with radiculopathy 07/01/2016   Spondylolisthesis 07/01/2016   Greater trochanteric bursitis of right hip 08/20/2015   Chronic interstitial cystitis 11/25/2014   H/O Spinal surgery 06/24/2014   Fibrositis 01/29/2014   Fibromyalgia 01/29/2014   Lumbosacral radiculitis 10/09/2013   Lumbar post-laminectomy syndrome 09/17/2013   Atypical chest pain 09/02/2013   Other long term (current)  drug therapy 05/01/2013   Arthritis 04/16/2013   Abdominal pain, epigastric 03/26/2013   Back pain, chronic 12/05/2012   Connective tissue disease, undifferentiated (HCC) 12/05/2012   Raynaud's syndrome without gangrene 12/05/2012   Systemic lupus erythematosus (HCC) 12/05/2012   Raynaud's phenomenon 12/05/2012   Cerebrospinal fluid leak from spinal puncture 08/24/2012   UNSPECIFIED DISORDER OF  STOMACH AND DUODENUM 08/26/2008    Allergies:  Allergies  Allergen Reactions   Gabapentin  Other (See Comments)    Made paranoia   Amoxicillin Rash   Penicillins Rash    Has patient had a PCN reaction causing immediate rash, facial/tongue/throat swelling, SOB or lightheadedness with hypotension: yes Has patient had a PCN reaction causing severe rash involving mucus membranes or skin necrosis: yes pt did experience rash but it was not severe  Has patient had a PCN reaction that required hospitalization: no Has patient had a PCN reaction occurring within the last 10 years: no If all of the above answers are "NO", then may proceed with Cephalosporin use.    Tegretol [Carbamazepine] Rash   Medications:  Current Outpatient Medications:    methylphenidate  (RITALIN ) 10 MG tablet, Take 1 tablet (10 mg total) by mouth 2 (two) times daily., Disp: 60 tablet, Rfl: 0   [START ON 04/04/2024] methylphenidate  (RITALIN ) 10 MG tablet, Take 1 tablet (10 mg total) by mouth 2 (two) times daily., Disp: 60 tablet, Rfl: 0   [START ON 05/05/2024] methylphenidate  (RITALIN ) 10 MG tablet, Take 1 tablet (10 mg total) by mouth 2 (two) times daily., Disp: 60 tablet, Rfl: 0   baclofen  (LIORESAL ) 10 MG tablet, Take 1 tablet (10 mg total) by mouth 3 (three) times daily. (Patient taking differently: Take 10 mg by mouth 3 (three) times daily. PRN), Disp: 60 each, Rfl: 2   Cholecalciferol (VITAMIN D3) 5000 units CAPS, Take 1 tablet by mouth daily., Disp: , Rfl:    cycloSPORINE (RESTASIS) 0.05 % ophthalmic emulsion, Place 1 drop into both eyes 2 (two) times daily., Disp: , Rfl:    docusate sodium (COLACE) 100 MG capsule, Take 100 mg by mouth daily., Disp: , Rfl:    estradiol  (VIVELLE -DOT) 0.075 MG/24HR, Place 1 patch onto the skin 2 (two) times a week., Disp: 8 patch, Rfl: 1   Estradiol  10 MCG TABS vaginal tablet, Place 1 tablet (10 mcg total) vaginally 2 (two) times a week., Disp: 24 tablet, Rfl: 3   famotidine  (PEPCID ) 20 MG  tablet, Take 1 tablet (20 mg total) by mouth daily., Disp: 30 tablet, Rfl: 3   hydrocortisone 2.5 % ointment, SMARTSIG:sparingly Topical Twice Daily PRN, Disp: , Rfl:    magnesium oxide (MAG-OX) 400 MG tablet, Take 400 mg by mouth daily., Disp: , Rfl:    TURMERIC PO, Take 1 tablet by mouth daily., Disp: , Rfl:   Observations/Objective: Patient is well-developed, well-nourished in no acute distress.  Resting comfortably  at home.  Head is normocephalic, atraumatic.  No labored breathing. Speech is clear and coherent with logical content.  Patient is alert and oriented at baseline.  Anxious   Assessment and Plan: 1. Bipolar 1 disorder, manic, mild (HCC)  2. Attention deficit hyperactivity disorder (ADHD), combined type (Primary) - methylphenidate  (RITALIN ) 10 MG tablet; Take 1 tablet (10 mg total) by mouth 2 (two) times daily.  Dispense: 60 tablet; Refill: 0 - methylphenidate  (RITALIN ) 10 MG tablet; Take 1 tablet (10 mg total) by mouth 2 (two) times daily.  Dispense: 60 tablet; Refill: 0 - methylphenidate  (RITALIN ) 10 MG tablet; Take 1  tablet (10 mg total) by mouth 2 (two) times daily.  Dispense: 60 tablet; Refill: 0  Will increase Ritalin  to 10 mg BID from 5 mg  Pt does not want to add any other medications at this time. Does not want to see Behavorial health at this time.  Stress management Meds as prescribed Behavior modification as needed Follow-up for recheck in 3 months  Follow Up Instructions: I discussed the assessment and treatment plan with the patient. The patient was provided an opportunity to ask questions and all were answered. The patient agreed with the plan and demonstrated an understanding of the instructions.  A copy of instructions were sent to the patient via MyChart unless otherwise noted below.     The patient was advised to call back or seek an in-person evaluation if the symptoms worsen or if the condition fails to improve as anticipated.    Tommas Fragmin,  FNP

## 2024-03-07 NOTE — Telephone Encounter (Signed)
 Medication refill request: vivelle  dot patch 0.075mg  Last AEX:  01-13-23 Next AEX: 03-25-24 Last MMG (if hormonal medication request): 08-24-23 birads 1:neg Refill authorized: pharamacy requesting 90 day supply. They are also asking for you to put the DX code on the rx. Please approve or deny as appropriate

## 2024-03-14 ENCOUNTER — Encounter: Payer: Self-pay | Admitting: Obstetrics and Gynecology

## 2024-03-14 ENCOUNTER — Inpatient Hospital Stay: Payer: 59

## 2024-03-21 ENCOUNTER — Inpatient Hospital Stay: Payer: 59 | Admitting: Oncology

## 2024-03-25 ENCOUNTER — Ambulatory Visit (INDEPENDENT_AMBULATORY_CARE_PROVIDER_SITE_OTHER): Admitting: Obstetrics and Gynecology

## 2024-03-25 ENCOUNTER — Emergency Department (HOSPITAL_BASED_OUTPATIENT_CLINIC_OR_DEPARTMENT_OTHER)
Admission: EM | Admit: 2024-03-25 | Discharge: 2024-03-25 | Discharge status: Against Medical Advice | Attending: Emergency Medicine | Admitting: Emergency Medicine

## 2024-03-25 ENCOUNTER — Emergency Department (HOSPITAL_BASED_OUTPATIENT_CLINIC_OR_DEPARTMENT_OTHER): Admitting: Radiology

## 2024-03-25 ENCOUNTER — Encounter (HOSPITAL_BASED_OUTPATIENT_CLINIC_OR_DEPARTMENT_OTHER): Payer: Self-pay

## 2024-03-25 ENCOUNTER — Encounter: Payer: Self-pay | Admitting: Obstetrics and Gynecology

## 2024-03-25 ENCOUNTER — Other Ambulatory Visit: Payer: Self-pay

## 2024-03-25 VITALS — BP 110/78 | HR 73 | Ht 62.75 in | Wt 142.0 lb

## 2024-03-25 DIAGNOSIS — E2839 Other primary ovarian failure: Secondary | ICD-10-CM

## 2024-03-25 DIAGNOSIS — Z5321 Procedure and treatment not carried out due to patient leaving prior to being seen by health care provider: Secondary | ICD-10-CM | POA: Insufficient documentation

## 2024-03-25 DIAGNOSIS — Z79890 Hormone replacement therapy (postmenopausal): Secondary | ICD-10-CM | POA: Diagnosis not present

## 2024-03-25 DIAGNOSIS — Z01419 Encounter for gynecological examination (general) (routine) without abnormal findings: Secondary | ICD-10-CM | POA: Diagnosis not present

## 2024-03-25 DIAGNOSIS — M545 Low back pain, unspecified: Secondary | ICD-10-CM | POA: Insufficient documentation

## 2024-03-25 DIAGNOSIS — Z5181 Encounter for therapeutic drug level monitoring: Secondary | ICD-10-CM

## 2024-03-25 DIAGNOSIS — Z1231 Encounter for screening mammogram for malignant neoplasm of breast: Secondary | ICD-10-CM

## 2024-03-25 DIAGNOSIS — N952 Postmenopausal atrophic vaginitis: Secondary | ICD-10-CM

## 2024-03-25 DIAGNOSIS — Z1331 Encounter for screening for depression: Secondary | ICD-10-CM | POA: Diagnosis not present

## 2024-03-25 MED ORDER — ESTRADIOL 10 MCG VA TABS: 1.0000 | ORAL_TABLET | VAGINAL | 3 refills | Status: AC

## 2024-03-25 MED ORDER — ESTRADIOL 0.075 MG/24HR TD PTTW: 1.0000 | MEDICATED_PATCH | TRANSDERMAL | 6 refills | Status: DC

## 2024-03-25 NOTE — ED Triage Notes (Signed)
 Patient comes in with back pain. She states she had surgery where her feet and legs were in the air for over two hours. She states she has had lower back surgery in her lumbar. She states she believes the surgeon ruined something in her lower pack. She says she does not want any medications for pain but rather just imaging to see if any of her hardware has moved.

## 2024-03-25 NOTE — Progress Notes (Signed)
 58 y.o. y.o. female here for annual exam. No LMP recorded. Patient has had a hysterectomy.    [id number] Legally Separated White or Caucasian Not Hispanic or Latino female here for annual exam.   H/O hysterectomy, on transdermal estrogen.  Still having some hot flashes on the estrogen, overall tolerable. Doesn't want to decrease her dose.  Sexually active, no pain. Same partner x 3 years (don't live together).     She see's Rheumatology for a lupus like syndrome (not officially diagnosed). Rheumatologist is fine with her being on ERT, blood work is all normal.   She is in chronic pain, she has rods and 12 screws in her back.   H/O IC, has problems. She has mixed incontinence.  -recent rectocele repair  No LMP recorded. Patient has had a hysterectomy.          Sexually active: Yes.    The current method of family planning is status post hysterectomy.    Exercising: Yes.    Walking  Smoker:  former HRT: vivelle  patch 0.075 and vagifem  PV Health Maintenance: Pap:  2012 WNL per pt History of abnormal Pap:  no MMG:  08/06/23 , BI-RADS CATEGORY 1 neg BMD:  baseline ordered. Long time use of prednisone  H/o lupus and OA. Degeneration seen on recent xray Colonoscopy: 08/19/14, f/u 1/25. Endoscopy 07/2022 TDaP:  unsure Gardasil: n/a Body mass index is 25.36 kg/m.     03/25/2024    1:46 PM 11/01/2023    8:21 AM 06/29/2023   10:16 AM  Depression screen PHQ 2/9  Decreased Interest 0 1 1  Down, Depressed, Hopeless 0 1 0  PHQ - 2 Score 0 2 1  Altered sleeping   1  Tired, decreased energy   2  Change in appetite   1  Feeling bad or failure about yourself    0  Trouble concentrating   2  Moving slowly or fidgety/restless   1  Suicidal thoughts   0  PHQ-9 Score   8  Difficult doing work/chores   Somewhat difficult    Blood pressure 110/78, pulse 73, height 5' 2.75" (1.594 m), weight 142 lb (64.4 kg), SpO2 99%.  No results found for: "DIAGPAP", "HPVHIGH", "ADEQPAP"  GYN HISTORY: No  results found for: "DIAGPAP", "HPVHIGH", "ADEQPAP"  OB History  Gravida Para Term Preterm AB Living  2 2 1 1  3   SAB IAB Ectopic Multiple Live Births     1 3    # Outcome Date GA Lbr Len/2nd Weight Sex Type Anes PTL Lv  2 Term      CS-Unspec   LIV  1A Preterm      CS-Unspec   LIV  1B Preterm      CS-Unspec   LIV    Past Medical History:  Diagnosis Date   Abdominal pain    Allergy    Anxiety    takes Xanax  daily as needed   Arthritis    Cancer (HCC)    MOLE PRE   Cerebrospinal fluid leak from spinal puncture 08/24/2012   Chronic back pain    Constipation    takes Dulcolax and Miralax  daily as needed;also Fiber Pills   Depression    takes Lamictal  daily   Fibromyalgia    GERD (gastroesophageal reflux disease)    takes Omeprazole  daily   Headache(784.0)    Heart murmur    slight   History of bronchitis    last time many yrs ago(59yrs ago)   Internal  hemorrhoid    Interstitial cystitis    no problems since beginning of Jan 2014   Joint pain    Joint swelling    Lupus    takes Plaquenil daily   Muscle spasm    takes Robaxin  daily as needed   Numbness and tingling in hands    Osteoarthritis    Pneumonia    hx of > 41yrs ago   Raynaud's disease    Urinary frequency    Urinary urgency     Past Surgical History:  Procedure Laterality Date   ABDOMINAL HERNIA REPAIR     APPENDECTOMY     BLADDER SUSPENSION     CESAREAN SECTION     CHOLECYSTECTOMY     COLONOSCOPY     DIAGNOSTIC LAPAROSCOPY     adhesions   ENDOMETRIAL ABLATION     ESOPHAGOGASTRODUODENOSCOPY N/A 05/08/2013   Procedure: ESOPHAGOGASTRODUODENOSCOPY (EGD);  Surgeon: Ruby Corporal, MD;  Location: [facility];  Service: Endoscopy;  Laterality: N/A;  315   NASAL SEPTUM SURGERY     OVARIAN CYST REMOVAL     REPAIR OF RECTAL PROLAPSE     SHOULDER SURGERY Right    x 2   SPINAL CORD STIMULATOR INSERTION N/A 06/13/2014   Procedure: LUMBAR SPINAL CORD STIMULATOR INSERTION;  Surgeon: Darryl Endow, MD;   Location: [facility];  Service: Neurosurgery;  Laterality: N/A;   SPINAL FUSION     x 2    TONSILLECTOMY     TOTAL VAGINAL HYSTERECTOMY  05/2011   TUBAL LIGATION     uterine ablation      Current Outpatient Medications on File Prior to Visit  Medication Sig Dispense Refill   acetaminophen  (TYLENOL ) 325 MG tablet Take 975 mg by mouth.     baclofen  (LIORESAL ) 10 MG tablet Take 1 tablet (10 mg total) by mouth 3 (three) times daily. (Patient taking differently: Take 10 mg by mouth 3 (three) times daily. PRN) 60 each 2   Cholecalciferol (VITAMIN D3) 5000 units CAPS Take 1 tablet by mouth daily.     cycloSPORINE (RESTASIS) 0.05 % ophthalmic emulsion Place 1 drop into both eyes 2 (two) times daily.     docusate sodium (COLACE) 100 MG capsule Take 100 mg by mouth daily.     estradiol  (VIVELLE -DOT) 0.075 MG/24HR PLACE 1 PATCH ONTO THE SKIN 2 TIMES A WEEK. 24 patch 0   Estradiol  10 MCG TABS vaginal tablet Place 1 tablet (10 mcg total) vaginally 2 (two) times a week. 24 tablet 3   famotidine  (PEPCID ) 20 MG tablet Take 1 tablet (20 mg total) by mouth daily. 30 tablet 3   fexofenadine (ALLEGRA) 180 MG tablet Take 180 mg by mouth.     hydrocortisone 2.5 % ointment SMARTSIG:sparingly Topical Twice Daily PRN     ibuprofen (ADVIL) 200 MG tablet Take 200 mg by mouth.     Magnesium Citrate 100 MG CAPS Take 1 capsule by mouth daily.     methocarbamol  (ROBAXIN ) 500 MG tablet Take 500 mg by mouth.     [START ON 04/04/2024] methylphenidate  (RITALIN ) 10 MG tablet Take 1 tablet (10 mg total) by mouth 2 (two) times daily. (Patient taking differently: Take 5 mg by mouth 2 (two) times daily.) 60 tablet 0   TURMERIC PO Take 1 tablet by mouth daily.     lamoTRIgine  (LAMICTAL ) 25 MG tablet Take 25 mg by mouth. (Patient not taking: Reported on 03/25/2024)     [START ON 05/05/2024] methylphenidate  (RITALIN ) 10 MG  tablet Take 1 tablet (10 mg total) by mouth 2 (two) times daily. 60 tablet 0   No current  facility-administered medications on file prior to visit.    Social History   Socioeconomic History   Marital status: Legally Separated    Spouse name: Not on file   Number of children: 3   Years of education: Not on file   Highest education level: Associate degree: academic program  Occupational History   Not on file  Tobacco Use   Smoking status: Former    Current packs/day: 0.00    Average packs/day: 0.5 packs/day for 20.0 years (10.0 ttl pk-yrs)    Types: Cigarettes    Start date: 06/09/1988    Quit date: 06/09/2008    Years since quitting: 15.8   Smokeless tobacco: Never  Vaping Use   Vaping status: Never Used  Substance and Sexual Activity   Alcohol use: Not Currently    Comment: occassionaly   Drug use: No   Sexual activity: Yes    Partners: Male    Birth control/protection: Surgical    Comment: Hysterectomy, HSV+  Other Topics Concern   Not on file  Social History Narrative   Not on file   Social Drivers of Health   Financial Resource Strain: Medium Risk (02/02/2024)   Overall Financial Resource Strain (CARDIA)    Difficulty of Paying Living Expenses: Somewhat hard  Food Insecurity: Low Risk  (02/15/2024)   Received from [facility]   Hunger Vital Sign    Worried About Running Out of Food in the Last Year: Never true    Ran Out of Food in the Last Year: Never true  Transportation Needs: No Transportation Needs (02/15/2024)   Received from [facility]    In the past 12 months, has lack of reliable transportation kept you from medical appointments, meetings, work or from getting things needed for daily living? : No  Physical Activity: Sufficiently Active (02/02/2024)   Exercise Vital Sign    Days of Exercise per Week: 4 days    Minutes of Exercise per Session: 50 min  Stress: Stress Concern Present (02/02/2024)   [employer] of Occupational Health - Occupational Stress Questionnaire    Feeling of Stress : To some extent  Social  Connections: Moderately Isolated (02/02/2024)   Social Connection and Isolation Panel [NHANES]    Frequency of Communication with Friends and Family: More than three times a week    Frequency of Social Gatherings with Friends and Family: Twice a week    Attends Religious Services: More than 4 times per year    Active Member of [employer] or Organizations: No    Attends Banker Meetings: Not on file    Marital Status: Separated  Intimate Partner Violence: Not At Risk (11/01/2023)   Humiliation, Afraid, Rape, and Kick questionnaire    Fear of Current or Ex-Partner: No    Emotionally Abused: No    Physically Abused: No    Sexually Abused: No    Family History  Problem Relation Age of Onset   Hyperlipidemia Mother    Depression Mother    Anxiety disorder Mother    Breast cancer Mother    Lung cancer Mother    Colon polyps Father    Breast cancer Maternal Grandmother    Alzheimer's disease Maternal Grandmother    Ovarian cancer Maternal Aunt    Colon cancer Maternal Uncle    Colon cancer Paternal Aunt    Esophageal cancer  Neg Hx    Pancreatic cancer Neg Hx    Stomach cancer Neg Hx    Rectal cancer Neg Hx      Allergies  Allergen Reactions   Gabapentin  Other (See Comments)    Made paranoia   Amoxicillin Rash   Penicillins Rash    Has patient had a PCN reaction causing immediate rash, facial/tongue/throat swelling, SOB or lightheadedness with hypotension: yes Has patient had a PCN reaction causing severe rash involving mucus membranes or skin necrosis: yes pt did experience rash but it was not severe  Has patient had a PCN reaction that required hospitalization: no Has patient had a PCN reaction occurring within the last 10 years: no If all of the above answers are "NO", then may proceed with Cephalosporin use.    Tegretol [Carbamazepine] Rash      Patient's last menstrual period was No LMP recorded. Patient has had a hysterectomy..             Review of  Systems Alls systems reviewed and are negative.     Physical Exam Constitutional:      Appearance: Normal appearance.  Genitourinary:     Vulva normal.     No lesions in the vagina.     Genitourinary Comments: Deferred with recent surgery has postop for rectocele repair this month     Right Labia: No rash, lesions or skin changes.    Left Labia: No lesions, skin changes or rash.    Vaginal cuff intact.    No vaginal discharge or tenderness.     No vaginal prolapse present.    No vaginal atrophy present.     Right Adnexa: not tender and no mass present.    Left Adnexa: not tender and no mass present.    Cervix is not absent.     Uterus is not absent. Breasts:    Right: Normal.     Left: Normal.  HENT:     Head: Normocephalic.  Neck:     Thyroid: No thyroid mass, thyromegaly or thyroid tenderness.  Cardiovascular:     Rate and Rhythm: Normal rate and regular rhythm.     Heart sounds: Normal heart sounds, S1 normal and S2 normal.  Pulmonary:     Effort: Pulmonary effort is normal.     Breath sounds: Normal breath sounds and air entry.  Abdominal:     General: There is no distension.     Palpations: Abdomen is soft. There is no mass.     Tenderness: There is no abdominal tenderness. There is no guarding or rebound.     Comments: Breast exam normal  No masses, discharge or retraction b/l  Musculoskeletal:        General: Normal range of motion.     Cervical back: Full passive range of motion without pain, normal range of motion and neck supple. No tenderness.     Right lower leg: No edema.     Left lower leg: No edema.  Neurological:     Mental Status: She is alert and oriented to person, place, and time.  Skin:    General: Skin is warm.  Psychiatric:        Mood and Affect: Mood normal.        Behavior: Behavior normal.        Thought Content: Thought content normal.  Vitals and nursing note reviewed. Exam conducted with a chaperone present.       A:  Well Woman GYN exam                             P:        Pap smear not indicated Encouraged annual mammogram screening Colon cancer screening up-to-date DXA ordered today Labs and immunizations ordered today Discussed breast self exams Encouraged healthy lifestyle practices Encouraged Vit D and Calcium   No follow-ups on file.  Reinaldo Caras

## 2024-03-26 ENCOUNTER — Ambulatory Visit: Payer: Self-pay | Admitting: Obstetrics and Gynecology

## 2024-03-26 LAB — COMPREHENSIVE METABOLIC PANEL WITH GFR
AG Ratio: 2.4 (calc) (ref 1.0–2.5)
ALT: 14 U/L (ref 6–29)
AST: 20 U/L (ref 10–35)
Albumin: 4.5 g/dL (ref 3.6–5.1)
Alkaline phosphatase (APISO): 45 U/L (ref 37–153)
BUN: 12 mg/dL (ref 7–25)
CO2: 26 mmol/L (ref 20–32)
Calcium: 9 mg/dL (ref 8.6–10.4)
Chloride: 101 mmol/L (ref 98–110)
Creat: 0.84 mg/dL (ref 0.50–1.03)
Globulin: 1.9 g/dL (ref 1.9–3.7)
Glucose, Bld: 82 mg/dL (ref 65–99)
Potassium: 4.4 mmol/L (ref 3.5–5.3)
Sodium: 137 mmol/L (ref 135–146)
Total Bilirubin: 0.3 mg/dL (ref 0.2–1.2)
Total Protein: 6.4 g/dL (ref 6.1–8.1)
eGFR: 81 mL/min/{1.73_m2} (ref >=60)

## 2024-03-26 LAB — LIPID PANEL
Cholesterol: 197 mg/dL (ref <200)
HDL: 95 mg/dL (ref >=50)
LDL Cholesterol (Calc): 85 mg/dL
Non-HDL Cholesterol (Calc): 102 mg/dL (ref <130)
Total CHOL/HDL Ratio: 2.1 (calc) (ref <5.0)
Triglycerides: 80 mg/dL (ref <150)

## 2024-03-26 LAB — CBC
HCT: 37.3 % (ref 35.0–45.0)
Hemoglobin: 12.4 g/dL (ref 11.7–15.5)
MCH: 32.3 pg (ref 27.0–33.0)
MCHC: 33.2 g/dL (ref 32.0–36.0)
MCV: 97.1 fL (ref 80.0–100.0)
MPV: 9.7 fL (ref 7.5–12.5)
Platelets: 262 10*3/uL (ref 140–400)
RBC: 3.84 10*6/uL (ref 3.80–5.10)
RDW: 12.3 % (ref 11.0–15.0)
WBC: 3.9 10*3/uL (ref 3.8–10.8)

## 2024-03-26 LAB — TSH: TSH: 1.19 m[IU]/L (ref 0.40–4.50)

## 2024-05-29 ENCOUNTER — Other Ambulatory Visit: Payer: Self-pay | Admitting: Obstetrics and Gynecology

## 2024-05-29 DIAGNOSIS — Z5181 Encounter for therapeutic drug level monitoring: Secondary | ICD-10-CM

## 2024-05-29 NOTE — Telephone Encounter (Signed)
 Med refill request: Vivelle   Last AEX: 03/25/24 Next AEX: not scheduled  Last MMG (if hormonal med) 08/24/23 birads cat 1 neg  Refill authorized: last rx 03/25/24 #24 with 6 refills. Please approve or deny

## 2024-06-07 ENCOUNTER — Encounter: Payer: Self-pay | Admitting: Family

## 2024-06-07 ENCOUNTER — Telehealth (INDEPENDENT_AMBULATORY_CARE_PROVIDER_SITE_OTHER): Admitting: Family

## 2024-06-07 DIAGNOSIS — F902 Attention-deficit hyperactivity disorder, combined type: Secondary | ICD-10-CM

## 2024-06-07 MED ORDER — METHYLPHENIDATE HCL 5 MG PO TABS: 5.0000 mg | ORAL_TABLET | Freq: Every day | ORAL | 0 refills | Status: DC

## 2024-06-07 NOTE — Progress Notes (Signed)
 Virtual Visit Consent   [name], you are scheduled for a virtual visit with a [facility] provider today. Just as with appointments in the office, your consent must be obtained to participate. Your consent will be active for this visit and any virtual visit you may have with one of our providers in the next 365 days. If you have a MyChart account, a copy of this consent can be sent to you electronically.  As this is a virtual visit, video technology does not allow for your provider to perform a traditional examination. This may limit your provider's ability to fully assess your condition. If your provider identifies any concerns that need to be evaluated in person or the need to arrange testing (such as labs, EKG, etc.), we will make arrangements to do so. Although advances in technology are sophisticated, we cannot ensure that it will always work on either your end or our end. If the connection with a video visit is poor, the visit may have to be switched to a telephone visit. With either a video or telephone visit, we are not always able to ensure that we have a secure connection.  By engaging in this virtual visit, you consent to the provision of healthcare and authorize for your insurance to be billed (if applicable) for the services provided during this visit. Depending on your insurance coverage, you may receive a charge related to this service.  I need to obtain your verbal consent now. Are you willing to proceed with your visit today? PAULETTA PICKNEY has provided verbal consent on 06/07/2024 for a virtual visit (video or telephone). Maureen Learn, FNP  Date: 06/07/2024 10:30 AM   Virtual Visit via Video Note   I, Maureen [name], connected with  Maureen [name]  ([id number], [birth year]) on 06/07/24 at 10:10 AM EDT by a video-enabled telemedicine application and verified that I am speaking with the correct person using two identifiers.  Location: Patient: Virtual Visit Location Patient:  [facility] Provider: Virtual Visit Location Provider: Home Office   I discussed the limitations of evaluation and management by telemedicine and the availability of in person appointments. The patient expressed understanding and agreed to proceed.    History of Present Illness: Maureen [name] is a 58 y.o. who identifies as a female who was assigned female at birth, and is being seen today for to recheck ADHD. She was started on Ritalin 5 mg daily Reports this was helping. On her next visit we had increased her to 10 mg. She states she did not like how that made her feel. Wants to decrease to 5 mg.    HPI: HPI  Problems:  Patient Active Problem List   Diagnosis Date Noted   Attention deficit hyperactivity disorder (ADHD), combined type 03/07/2024   Leukopenia 10/31/2023   HSV-2 (herpes simplex virus 2) infection 09/29/2023   Primary osteoarthritis 07/19/2022   Bipolar 1 disorder, manic, mild (HCC) 12/28/2021   Aortic atherosclerosis (HCC) 02/08/2021   Vision changes 05/28/2020   Paresthesia 05/28/2020   Cervical spondylosis with radiculopathy 07/01/2016   Spondylolisthesis 07/01/2016   Greater trochanteric bursitis of right hip 08/20/2015   Chronic interstitial cystitis 11/25/2014   H/O Spinal surgery 06/24/2014   Fibrositis 01/29/2014   Fibromyalgia 01/29/2014   Lumbosacral radiculitis 10/09/2013   Lumbar post-laminectomy syndrome 09/17/2013   Atypical chest pain 09/02/2013   Other long term (current) drug therapy 05/01/2013   Arthritis 04/16/2013   Abdominal pain, epigastric 03/26/2013   Back pain, chronic 12/05/2012  Connective tissue disease, undifferentiated (HCC) 12/05/2012   Raynaud's syndrome without gangrene 12/05/2012   Systemic lupus erythematosus (HCC) 12/05/2012   Raynaud's phenomenon 12/05/2012   Cerebrospinal fluid leak from spinal puncture 08/24/2012   UNSPECIFIED DISORDER OF STOMACH AND DUODENUM 08/26/2008    Allergies:  Allergies  Allergen Reactions    Gabapentin Other (See Comments)    Made paranoia   Amoxicillin Rash   Penicillins Rash    Has patient had a PCN reaction causing immediate rash, facial/tongue/throat swelling, SOB or lightheadedness with hypotension: yes Has patient had a PCN reaction causing severe rash involving mucus membranes or skin necrosis: yes pt did experience rash but it was not severe  Has patient had a PCN reaction that required hospitalization: no Has patient had a PCN reaction occurring within the last 10 years: no If all of the above answers are NO, then may proceed with Cephalosporin use.    Tegretol [Carbamazepine] Rash   Medications:  Current Outpatient Medications:    methylphenidate (RITALIN) 5 MG tablet, Take 1 tablet (5 mg total) by mouth daily., Disp: 30 tablet, Rfl: 0   [START ON 07/05/2024] methylphenidate (RITALIN) 5 MG tablet, Take 1 tablet (5 mg total) by mouth daily., Disp: 30 tablet, Rfl: 0   [START ON 08/02/2024] methylphenidate (RITALIN) 5 MG tablet, Take 1 tablet (5 mg total) by mouth daily., Disp: 30 tablet, Rfl: 0   acetaminophen (TYLENOL) 325 MG tablet, Take 975 mg by mouth., Disp: , Rfl:    baclofen (LIORESAL) 10 MG tablet, Take 1 tablet (10 mg total) by mouth 3 (three) times daily. (Patient taking differently: Take 10 mg by mouth 3 (three) times daily. PRN), Disp: 60 each, Rfl: 2   Cholecalciferol (VITAMIN D3) 5000 units CAPS, Take 1 tablet by mouth daily., Disp: , Rfl:    cycloSPORINE (RESTASIS) 0.05 % ophthalmic emulsion, Place 1 drop into both eyes 2 (two) times daily., Disp: , Rfl:    docusate sodium (COLACE) 100 MG capsule, Take 100 mg by mouth daily., Disp: , Rfl:    estradiol (VIVELLE-DOT) 0.075 MG/24HR, PLACE 1 PATCH ONTO THE SKIN 2 TIMES A WEEK., Disp: 8 patch, Rfl: 2   Estradiol 10 MCG TABS vaginal tablet, Place 1 tablet (10 mcg total) vaginally 2 (two) times a week., Disp: 24 tablet, Rfl: 3   famotidine (PEPCID) 20 MG tablet, Take 1 tablet (20 mg total) by mouth daily., Disp:  30 tablet, Rfl: 3   fexofenadine (ALLEGRA) 180 MG tablet, Take 180 mg by mouth., Disp: , Rfl:    hydrocortisone 2.5 % ointment, SMARTSIG:sparingly Topical Twice Daily PRN, Disp: , Rfl:    ibuprofen (ADVIL) 200 MG tablet, Take 200 mg by mouth., Disp: , Rfl:    Magnesium Citrate 100 MG CAPS, Take 1 capsule by mouth daily., Disp: , Rfl:    TURMERIC PO, Take 1 tablet by mouth daily., Disp: , Rfl:   Observations/Objective: Patient is well-developed, well-nourished in no acute distress.  Resting comfortably  at home.  Head is normocephalic, atraumatic.  No labored breathing. Speech is clear and coherent with logical content.  Patient is alert and oriented at baseline.  Anxious   Assessment and Plan: 1. Attention deficit hyperactivity disorder (ADHD), combined type (Primary) - methylphenidate (RITALIN) 5 MG tablet; Take 1 tablet (5 mg total) by mouth daily.  Dispense: 30 tablet; Refill: 0 - methylphenidate (RITALIN) 5 MG tablet; Take 1 tablet (5 mg total) by mouth daily.  Dispense: 30 tablet; Refill: 0 - methylphenidate (RITALIN) 5 MG tablet;  Take 1 tablet (5 mg total) by mouth daily.  Dispense: 30 tablet; Refill: 0   Will refill Ritalin 5 mg daily Pt does not want to add any other medications at this time. Does not want to see Behavorial health at this time.  Stress management Meds as prescribed Behavior modification as needed Follow-up for recheck in 3 months  Follow Up Instructions: I discussed the assessment and treatment plan with the patient. The patient was provided an opportunity to ask questions and all were answered. The patient agreed with the plan and demonstrated an understanding of the instructions.  A copy of instructions were sent to the patient via MyChart unless otherwise noted below.     The patient was advised to call back or seek an in-person evaluation if the symptoms worsen or if the condition fails to improve as anticipated.    Maureen Learn, FNP

## 2024-07-16 ENCOUNTER — Encounter: Payer: Self-pay | Admitting: Family

## 2024-07-16 ENCOUNTER — Ambulatory Visit: Admitting: Family

## 2024-07-16 VITALS — BP 107/69 | HR 66 | Temp 97.7°F | Ht 62.75 in | Wt 146.2 lb

## 2024-07-16 DIAGNOSIS — G8929 Other chronic pain: Secondary | ICD-10-CM

## 2024-07-16 DIAGNOSIS — F902 Attention-deficit hyperactivity disorder, combined type: Secondary | ICD-10-CM

## 2024-07-16 DIAGNOSIS — J301 Allergic rhinitis due to pollen: Secondary | ICD-10-CM

## 2024-07-16 DIAGNOSIS — M545 Low back pain, unspecified: Secondary | ICD-10-CM | POA: Diagnosis not present

## 2024-07-16 DIAGNOSIS — Z0001 Encounter for general adult medical examination with abnormal findings: Secondary | ICD-10-CM

## 2024-07-16 DIAGNOSIS — M150 Primary generalized (osteo)arthritis: Secondary | ICD-10-CM

## 2024-07-16 DIAGNOSIS — Z0000 Encounter for general adult medical examination without abnormal findings: Secondary | ICD-10-CM

## 2024-07-16 DIAGNOSIS — M329 Systemic lupus erythematosus, unspecified: Secondary | ICD-10-CM

## 2024-07-16 DIAGNOSIS — I700 Atherosclerosis of aorta: Secondary | ICD-10-CM

## 2024-07-16 DIAGNOSIS — F3111 Bipolar disorder, current episode manic without psychotic features, mild: Secondary | ICD-10-CM

## 2024-07-16 MED ORDER — METHYLPHENIDATE HCL 5 MG PO TABS: 5.0000 mg | ORAL_TABLET | Freq: Every day | ORAL | 0 refills | Status: AC

## 2024-07-16 MED ORDER — FLUTICASONE PROPIONATE 50 MCG/ACT NA SUSP: 2.0000 | Freq: Every day | NASAL | 6 refills | Status: AC

## 2024-07-16 NOTE — Patient Instructions (Signed)

## 2024-07-16 NOTE — Progress Notes (Signed)
 Subjective:    Patient ID: Maureen [name], female    DOB: 11/18/1965, 58 y.o.   MRN: [id number]  Chief Complaint  Patient presents with   Annual Exam   Pt presents to the office today for CPE.   She is followed by Gyn annually.   She has lupus and followed by Rheumatologists every 6 months.   She is followed by Ortho for left knee pain. Has got steroid and gel injections with mild relief. She is scheduled for MRI.   She has aortic atherosclerosis, but does not take statin.   She has been diagnosed with Bipolar in the past and was started on Lamictal  in the past, but caused sun sensitivity.   However, her ADHD is still causing her problems at work. She reports she can not complete task, can not focus, and gets overwhelmed. She is struggling at work. She is currently taking Ritalin  5 mg daily Reports this was helping staying on task.  Arthritis Presents for follow-up visit. She complains of pain and stiffness. Affected locations include the right knee, left knee, left MCP, right MCP, left hip, right hip, left foot and right foot. Her pain is at a severity of 6/10.  Back Pain This is a chronic problem. The current episode started more than 1 year ago. The problem occurs intermittently. The problem has been waxing and waning since onset. The pain is present in the lumbar spine. The quality of the pain is described as aching. The pain is at a severity of 6/10. The pain is moderate. She has tried home exercises and muscle relaxant for the symptoms. The treatment provided mild relief.      Review of Systems  Musculoskeletal:  Positive for back pain and stiffness.  All other systems reviewed and are negative.      Objective:   Physical Exam Vitals reviewed.  Constitutional:      General: She is not in acute distress.    Appearance: She is well-developed.  HENT:     Head: Normocephalic and atraumatic.     Right Ear: Tympanic membrane normal.     Left Ear: Tympanic membrane  normal.  Eyes:     Pupils: Pupils are equal, round, and reactive to light.  Neck:     Thyroid: No thyromegaly.  Cardiovascular:     Rate and Rhythm: Normal rate and regular rhythm.     Heart sounds: Normal heart sounds. No murmur heard. Pulmonary:     Effort: Pulmonary effort is normal. No respiratory distress.     Breath sounds: Normal breath sounds. No wheezing.  Abdominal:     General: Bowel sounds are normal. There is no distension.     Palpations: Abdomen is soft.     Tenderness: There is no abdominal tenderness.  Musculoskeletal:        General: No tenderness. Normal range of motion.     Cervical back: Normal range of motion and neck supple.     Comments: Full ROM, pain in lumbar with flexion  Skin:    General: Skin is warm and dry.  Neurological:     Mental Status: She is alert and oriented to person, place, and time.     Cranial Nerves: No cranial nerve deficit.     Deep Tendon Reflexes: Reflexes are normal and symmetric.  Psychiatric:        Behavior: Behavior normal.        Thought Content: Thought content normal.  Judgment: Judgment normal.          BP 107/69   Pulse 66   Temp 97.7 F (36.5 C) (Temporal)   Ht 5' 2.75 (1.594 m)   Wt 146 lb 3.2 oz (66.3 kg)   SpO2 98%   BMI 26.10 kg/m   Assessment & Plan:   Maureen [name] comes in today with chief complaint of Annual Exam   Diagnosis and orders addressed:  1. Annual physical exam (Primary)  2. Chronic bilateral low back pain without sciatica  3. Systemic lupus erythematosus, unspecified SLE type, unspecified organ involvement status (HCC)  4. Bipolar 1 disorder, manic, mild (HCC)  5. Aortic atherosclerosis   6. Primary osteoarthritis involving multiple joints  7. Attention deficit hyperactivity disorder (ADHD), combined type Continue Ritalin  5 mg  Meds as prescribed Behavior modification as needed Follow-up for recheck in 3 months - methylphenidate  (RITALIN ) 5 MG tablet; Take 1  tablet (5 mg total) by mouth daily.  Dispense: 30 tablet; Refill: 0 - methylphenidate  (RITALIN ) 5 MG tablet; Take 1 tablet (5 mg total) by mouth daily.  Dispense: 30 tablet; Refill: 0 - methylphenidate  (RITALIN ) 5 MG tablet; Take 1 tablet (5 mg total) by mouth daily.  Dispense: 30 tablet; Refill: 0  8. Allergic rhinitis due to pollen, unspecified seasonality - fluticasone  (FLONASE ) 50 MCG/ACT nasal spray; Place 2 sprays into both nostrils daily.  Dispense: 16 g; Refill: 6   Continue Ritalin  5 mg daily Labs reviewed from GYN Keep follow up with specialists Continue current medications  Health Maintenance reviewed Diet and exercise encouraged  Follow up plan: 3 month   [name], FNP

## 2024-08-21 ENCOUNTER — Other Ambulatory Visit: Payer: Self-pay | Admitting: Medical Genetics

## 2024-08-27 ENCOUNTER — Ambulatory Visit

## 2024-08-28 ENCOUNTER — Ambulatory Visit
Admission: RE | Admit: 2024-08-28 | Discharge: 2024-08-28 | Disposition: A | Discharge status: Home / Self Care | Source: Ambulatory Visit | Attending: Obstetrics and Gynecology | Admitting: Obstetrics and Gynecology

## 2024-08-28 DIAGNOSIS — Z01419 Encounter for gynecological examination (general) (routine) without abnormal findings: Secondary | ICD-10-CM

## 2024-08-28 DIAGNOSIS — Z1231 Encounter for screening mammogram for malignant neoplasm of breast: Secondary | ICD-10-CM

## 2024-09-30 ENCOUNTER — Telehealth: Payer: Self-pay | Admitting: Family

## 2024-09-30 NOTE — Telephone Encounter (Unsigned)
 Copied from CRM #[id number]. Topic: Clinical - Medication Refill >> Sep 30, 2024 11:37 AM Carrielelia G wrote: Medication: lamoTRIgine  (LAMICTAL ) 25 MG tablet  Has the patient contacted their pharmacy? No (Agent: If no, request that the patient contact the pharmacy for the refill. If patient does not wish to contact the pharmacy document the reason why and proceed with request.) (Agent: If yes, when and what did the pharmacy advise?)  This is the patient's preferred pharmacy:  [id number] - [place], [place] - [address] [place] [address] Phone: [phone] Fax: [phone]  Is this the correct pharmacy for this prescription? Yes If no, delete pharmacy and type the correct one.    Is the patient out of the medication? No  (has 4 left, just started back taking them been on them for a week now )   Has the patient been seen for an appointment in the last year OR does the patient have an upcoming appointment? Yes  Can we respond through MyChart? Yes  Agent: Please be advised that Rx refills may take up to 3 business days. We ask that you follow-up with your pharmacy.

## 2024-10-01 NOTE — Telephone Encounter (Signed)
Unable to pend, not on current med list

## 2024-10-02 ENCOUNTER — Other Ambulatory Visit

## 2024-10-03 MED ORDER — LAMOTRIGINE 25 MG PO TABS: 25.0000 mg | ORAL_TABLET | Freq: Every day | ORAL | 1 refills | Status: AC

## 2024-10-03 NOTE — Telephone Encounter (Signed)
 Notified patient.

## 2024-10-03 NOTE — Telephone Encounter (Signed)
 Prescription sent to pharmacy.

## 2024-10-08 ENCOUNTER — Ambulatory Visit: Admitting: Gastroenterology

## 2024-10-08 ENCOUNTER — Encounter: Payer: Self-pay | Admitting: Gastroenterology

## 2024-10-08 VITALS — BP 110/80 | HR 71 | Ht 62.75 in | Wt 145.0 lb

## 2024-10-08 DIAGNOSIS — K5902 Outlet dysfunction constipation: Secondary | ICD-10-CM

## 2024-10-08 DIAGNOSIS — Z860100 Personal history of colon polyps, unspecified: Secondary | ICD-10-CM

## 2024-10-08 DIAGNOSIS — Z83719 Family history of colon polyps, unspecified: Secondary | ICD-10-CM | POA: Diagnosis not present

## 2024-10-08 DIAGNOSIS — K648 Other hemorrhoids: Secondary | ICD-10-CM

## 2024-10-08 DIAGNOSIS — K649 Unspecified hemorrhoids: Secondary | ICD-10-CM

## 2024-10-08 NOTE — Progress Notes (Signed)
 ANOSCOPY PROCEDURE REPORT:     INDICATION:prolapsed hemorrhoid     The nature of anoscopy procedure and rationale for use was explained to the patient and they understood and agreed to proceed. Chaperone Kingston MA present.     The lubricated lighted anoscope was inserted with the patient in the left lateral decubitus position and the anus and distal rectum was examined.     FINDINGS: internal hemorrhoid noted

## 2024-10-08 NOTE — Patient Instructions (Addendum)
 Hemorrhoids High fiber diet No straining or pushing  Constipation Recommend high fiber diet Continue miralax  Continue stool softeners Continue fiber supplement   _______________________________________________________  If your blood pressure at your visit was 140/90 or greater, please contact your primary care physician to follow up on this.  _______________________________________________________  If you are age 57 or older, your body mass index should be between 23-30. Your Body mass index is 25.89 kg/m. If this is out of the aforementioned range listed, please consider follow up with your Primary Care Provider.  If you are age 38 or younger, your body mass index should be between 19-25. Your Body mass index is 25.89 kg/m. If this is out of the aformentioned range listed, please consider follow up with your Primary Care Provider.   ________________________________________________________  The [facility] providers would like to encourage you to use MYCHART to communicate with providers for non-urgent requests or questions.  Due to long hold times on the telephone, sending your provider a message by [facility] may be a faster and more efficient way to get a response.  Please allow 48 business hours for a response.  Please remember that this is for non-urgent requests.  _______________________________________________________  [facility] is using a team-based approach to care.  Your team is made up of your doctor and two to three APPS. Our APPS (Nurse Practitioners and Physician Assistants) work with your physician to ensure care continuity for you. They are fully qualified to address your health concerns and develop a treatment plan. They communicate directly with your gastroenterologist to care for you. Seeing the Advanced Practice Practitioners on your physician's team can help you by facilitating care more promptly, often allowing for earlier appointments, access to  diagnostic testing, procedures, and other specialty referrals.   Thank you for trusting me with your gastrointestinal care. Deanna May, FNP-C

## 2024-10-08 NOTE — Progress Notes (Signed)
 Attending Physician's Attestation   I have reviewed the chart.   I agree with the Advanced Practitioner's note, impression, and recommendations with any updates as below. Agree in Dr. Lafonda absence to plan of action outlined.   Aloha Finner, MD [facility] Advanced [facility]oscopy Office # [phone]

## 2024-10-08 NOTE — Progress Notes (Signed)
 Chief Complaint:hemorrhoids, constipation Primary GI Doctor: Dr. Federico  HPI: 58 y.o.  female with a past medical history not limited to GERD, systemic lupus erythematous, gastritis, and cholecystectomy who presents for evaluation of hemorrhoids, constipation.  Hemorrhoid banding with Dr. Federico: 01/15/24 hemorrhoid banding RA  01/31/24 hemorrhoid banding Posterior  02/13/24 hemorrhoid banding LA   Interval History Patient presents for evaluation of prolapsed internal hemorrhoids and constipation She reports difficulty with bowel movements intermittently. She feels her prescribed Ritalin  Maureen [name] have exacerbated the constipation. She uses stool softener daily and psyllium husk. She uses OTC Miralax  po daily. She reports the hemorrhoid will prolapse after bowel movement and has a odor. Patient reports seeing her first and third hemorrhoid fall off, but does ot feel the second one was effective.  02/19/24 rectopexy and sacrocolpoxy. PT had she had extremely hypertonic pelvic floor muscles, so she was prescribed vaginal valium  and pelvic floor PT.  Reports difficult recovery.  She has two stress fractures in left knee, has immobilizer on it today.    Wt Readings from Last 3 Encounters:  10/08/24 145 lb (65.8 kg)  07/16/24 146 lb 3.2 oz (66.3 kg)  03/25/24 142 lb (64.4 kg)    Past Medical History:  Diagnosis Date   Abdominal pain    Allergy    Anxiety    takes Xanax  daily as needed   Arthritis    Cancer (HCC)    MOLE PRE   Cerebrospinal fluid leak from spinal puncture 08/24/2012   Chronic back pain    Constipation    takes Dulcolax and Miralax  daily as needed;also Fiber Pills   Depression    takes Lamictal  daily   Fibromyalgia    GERD (gastroesophageal reflux disease)    takes Omeprazole  daily   Headache(784.0)    Heart murmur    slight   History of bronchitis    last time many yrs ago(30yrs ago)   Internal hemorrhoid    Interstitial cystitis    no problems since beginning of  Jan 2014   Joint pain    Joint swelling    Lupus    takes Plaquenil daily   Muscle spasm    takes Robaxin  daily as needed   Numbness and tingling in hands    Osteoarthritis    Pneumonia    hx of > 43yrs ago   Raynaud's disease    Urinary frequency    Urinary urgency     Past Surgical History:  Procedure Laterality Date   ABDOMINAL HERNIA REPAIR     APPENDECTOMY     BLADDER SUSPENSION     CESAREAN SECTION     CHOLECYSTECTOMY     COLONOSCOPY     DIAGNOSTIC LAPAROSCOPY     adhesions   ENDOMETRIAL ABLATION     ESOPHAGOGASTRODUODENOSCOPY N/A 05/08/2013   Procedure: ESOPHAGOGASTRODUODENOSCOPY (EGD);  Surgeon: Claudis RAYMOND Rivet, MD;  Location: [facility];  Service: Endoscopy;  Laterality: N/A;  315   NASAL SEPTUM SURGERY     OVARIAN CYST REMOVAL     REPAIR OF RECTAL PROLAPSE  02/19/2024   SHOULDER SURGERY Right    x 2   SPINAL CORD STIMULATOR INSERTION N/A 06/13/2014   Procedure: LUMBAR SPINAL CORD STIMULATOR INSERTION;  Surgeon: Deward JAYSON Fabian, MD;  Location: [facility];  Service: Neurosurgery;  Laterality: N/A;   SPINAL FUSION     x 2    TONSILLECTOMY     TOTAL VAGINAL HYSTERECTOMY  05/2011   TUBAL LIGATION  uterine ablation     VAGINAL PROLAPSE REPAIR      Current Outpatient Medications  Medication Sig Dispense Refill   acetaminophen  (TYLENOL ) 325 MG tablet Take 975 mg by mouth.     baclofen  (LIORESAL ) 10 MG tablet Take 1 tablet (10 mg total) by mouth 3 (three) times daily. 60 each 2   Cholecalciferol (VITAMIN D3) 5000 units CAPS Take 1 tablet by mouth daily.     cycloSPORINE (RESTASIS) 0.05 % ophthalmic emulsion Place 1 drop into both eyes 2 (two) times daily.     docusate sodium (COLACE) 100 MG capsule Take 100 mg by mouth daily.     estradiol  (VIVELLE -DOT) 0.075 MG/24HR PLACE 1 PATCH ONTO THE SKIN 2 TIMES A WEEK. 8 patch 2   Estradiol  10 MCG TABS vaginal tablet Place 1 tablet (10 mcg total) vaginally 2 (two) times a week. 24 tablet 3   famotidine  (PEPCID ) 20  MG tablet Take 1 tablet (20 mg total) by mouth daily. 30 tablet 3   fexofenadine (ALLEGRA) 180 MG tablet Take 180 mg by mouth.     fluticasone  (FLONASE ) 50 MCG/ACT nasal spray Place 2 sprays into both nostrils daily. 16 g 6   hydrocortisone 2.5 % ointment SMARTSIG:sparingly Topical Twice Daily PRN     ibuprofen (ADVIL) 200 MG tablet Take 200 mg by mouth.     lamoTRIgine  (LAMICTAL ) 25 MG tablet Take 1 tablet (25 mg total) by mouth daily. 90 tablet 1   Magnesium Citrate 100 MG CAPS Take 1 capsule by mouth daily.     methylphenidate  (RITALIN ) 5 MG tablet Take 1 tablet (5 mg total) by mouth daily. 30 tablet 0   methylphenidate  (RITALIN ) 5 MG tablet Take 1 tablet (5 mg total) by mouth daily. 30 tablet 0   methylphenidate  (RITALIN ) 5 MG tablet Take 1 tablet (5 mg total) by mouth daily. 30 tablet 0   TURMERIC PO Take 1 tablet by mouth daily.     No current facility-administered medications for this visit.    Allergies as of 10/08/2024 - Review Complete 07/16/2024  Allergen Reaction Noted   Gabapentin  Other (See Comments) 02/22/2022   Amoxicillin Rash 01/29/2014   Penicillins Rash 04/08/2011   Tegretol [carbamazepine] Rash 11/19/2013    Family History  Problem Relation Age of Onset   Hyperlipidemia Mother    Depression Mother    Anxiety disorder Mother    Breast cancer Mother    Lung cancer Mother    Colon polyps Father    Breast cancer Maternal Grandmother    Alzheimer's disease Maternal Grandmother    Ovarian cancer Maternal Aunt    Colon cancer Maternal Uncle    Colon cancer Paternal Aunt    Esophageal cancer Neg Hx    Pancreatic cancer Neg Hx    Stomach cancer Neg Hx    Rectal cancer Neg Hx     Review of Systems:    Constitutional: No weight loss, fever, chills, weakness or fatigue HEENT: Eyes: No change in vision               Ears, Nose, Throat:  No change in hearing or congestion Skin: No rash or itching Cardiovascular: No chest pain, chest pressure or palpitations    Respiratory: No SOB or cough Gastrointestinal: See HPI and otherwise negative Genitourinary: No dysuria or change in urinary frequency Neurological: No headache, dizziness or syncope Musculoskeletal: No new muscle or joint pain Hematologic: No bleeding or bruising Psychiatric: No history of depression or anxiety  Physical Exam:  Vital signs: BP 110/80 (BP Location: Left Arm, Patient Position: Sitting, Cuff Size: Normal)   Pulse 71   Ht 5' 2.75 (1.594 m)   Wt 145 lb (65.8 kg)   SpO2 97%   BMI 25.89 kg/m   Constitutional:   Pleasant female appears to be in NAD, Well developed, Well nourished, alert and cooperative Eyes:   PEERL, EOMI. No icterus. Conjunctiva pink. Neck:  Supple Throat: Oral cavity and pharynx without inflammation, swelling or lesion.  Respiratory: Respirations even and unlabored. Lungs clear to auscultation bilaterally.   No wheezes, crackles, or rhonchi.  Cardiovascular: Normal S1, S2. Regular rate and rhythm. No peripheral edema, cyanosis or pallor.  Gastrointestinal:  Soft, nondistended, nontender. No rebound or guarding. Normal bowel sounds. No appreciable masses or hepatomegaly. Rectal: Normal external rectal exam, normal rectal tone, appreciated internal hemorrhoids, non-tender, no masses, , brown stool, hemoccult N/A . Chaperone Ranita Anoscopy:internal hemorrhoid noted, otherwise normal exam Msk:  Symmetrical without gross deformities. Without edema, no deformity or joint abnormality.  Neurologic:  Alert and  oriented x4;  grossly normal neurologically.  Skin:   Dry and intact without significant lesions or rashes.  RELEVANT LABS AND IMAGING: CBC    Latest Ref Rng & Units 03/25/2024    2:08 PM 11/28/2023   10:30 AM 11/01/2023    9:11 AM  CBC  WBC 3.8 - 10.8 Thousand/uL 3.9  4.5  2.9   Hemoglobin 11.7 - 15.5 g/dL 87.5  85.5  85.5   Hematocrit 35.0 - 45.0 % 37.3  42.3  41.6   Platelets 140 - 400 Thousand/uL 262  274  256      CMP     Latest Ref Rng  & Units 03/25/2024    2:08 PM 10/16/2023   10:36 AM 06/16/2023    8:55 AM  CMP  Glucose 65 - 99 mg/dL 82  83  82   BUN 7 - 25 mg/dL 12  11  12    Creatinine 0.50 - 1.03 mg/dL 9.15  9.29  9.21   Sodium 135 - 146 mmol/L 137  136  139   Potassium 3.5 - 5.3 mmol/L 4.4  4.4  3.9   Chloride 98 - 110 mmol/L 101  99  100   CO2 20 - 32 mmol/L 26  26  27    Calcium 8.6 - 10.4 mg/dL 9.0  9.0  9.2   Total Protein 6.1 - 8.1 g/dL 6.4  5.9  6.4   Total Bilirubin 0.2 - 1.2 mg/dL 0.3  0.3  0.3   Alkaline Phos 44 - 121 IU/L  47  48   AST 10 - 35 U/L 20  21  18    ALT 6 - 29 U/L 14  22  16       Lab Results  Component Value Date   TSH 1.19 03/25/2024    12/02/2023 MR pelvis wo contrast IMPRESSION: Multi compartment pelvic organ prolapse, including mild cystocele with urethral hypermobility, mild prolapse of vaginal apex, and large rectocele.   11/13/2023 colonoscopy, recall 5 years - The examined portion of the ileum was normal. - Four 5 to 10 mm polyps in the rectum and in the sigmoid colon, removed with a cold snare. Resected and retrieved. - Non- bleeding internal hemorrhoids. Path: 1. Surgical [P], colon, rectum and sigmoid, polyp (4) :       -  HYPERPLASTIC POLYP WITH PROLAPSE EFFECT, FRAGMENTS.   02/2022 EGD with Dr. Teressa  Mild inflammation characterized by erythema was  found in the gastric antrum. Biopsies were taken with a cold forceps for histology.  Several linear erosions in the proximal 1/ 2 of her stomach, non- specific. These were sampled with biopsy forceps.  The examination was otherwise normal.  07/2014 colonoscopy showed 1 HPP   Assessment: Encounter Diagnoses  Name Primary?   Hemorrhoids, unspecified hemorrhoid type Yes   Constipation due to outlet dysfunction    History of colonic polyps    Family history of colonic polyps    58 year female patient with history of chronic constipation, hemorrhoids and rectocele. She has had rectopexy and sacrocolpoxy in April following 3  hemorrhoid bandings with Dr. Federico. Currently feels her constipation is mostly managed with high fiber diet, stool softeners. And OTC Miralax . She presents with issues of prolapsed hemorrhoid which was also seen on exam, will go ahead and schedule hemorrhoid banding with Dr. Federico.  Personal history of colonic polyps and family history of advanced polyps in father and mother.  Recall colonoscopy 5 years.  #1 Chronic constipation #2 Internal hemorrhoids, prolapsed -continue fiber supplements -continue stool softeners -continue Miralax  -schedule hemorrhoid banding with Dr. Federico  #3 Colon screening, history of colonic polyps #4 Family history of advanced colon polyps in her father and mother.  -recall colonoscopy 10/2028   Thank you for the courtesy of this consult. Please call me with any questions or concerns.   Krrish Freund, FNP-C [facility] 10/08/2024, 12:16 PM  Cc: Maureen [name] LABOR, FNP

## 2024-10-09 ENCOUNTER — Other Ambulatory Visit

## 2024-10-15 ENCOUNTER — Ambulatory Visit: Payer: Self-pay | Admitting: Family

## 2024-10-30 ENCOUNTER — Encounter: Payer: Self-pay | Admitting: Internal Medicine

## 2024-10-30 ENCOUNTER — Ambulatory Visit: Admitting: Internal Medicine

## 2024-10-30 VITALS — BP 114/62 | HR 63 | Ht 63.0 in | Wt 148.1 lb

## 2024-10-30 DIAGNOSIS — K219 Gastro-esophageal reflux disease without esophagitis: Secondary | ICD-10-CM

## 2024-10-30 DIAGNOSIS — K641 Second degree hemorrhoids: Secondary | ICD-10-CM

## 2024-10-30 DIAGNOSIS — K649 Unspecified hemorrhoids: Secondary | ICD-10-CM

## 2024-10-30 NOTE — Patient Instructions (Signed)
 HEMORRHOID BANDING PROCEDURE    FOLLOW-UP CARE   The procedure you have had should have been relatively painless since the banding of the area involved does not have nerve endings and there is no pain sensation.  The rubber band cuts off the blood supply to the hemorrhoid and the band may fall off as soon as 48 hours after the banding (the band may occasionally be seen in the toilet bowl following a bowel movement). You may notice a temporary feeling of fullness in the rectum which should respond adequately to plain Tylenol  or Motrin.  Following the banding, avoid strenuous exercise that evening and resume full activity the next day.  A sitz bath (soaking in a warm tub) or bidet is soothing, and can be useful for cleansing the area after bowel movements.     To avoid constipation, take two tablespoons of natural wheat bran, natural oat bran, flax, Benefiber or any over the counter fiber supplement and increase your water  intake to 7-8 glasses daily.    Unless you have been prescribed anorectal medication, do not put anything inside your rectum for two weeks: No suppositories, enemas, fingers, etc.  Occasionally, you may have more bleeding than usual after the banding procedure.  This is often from the untreated hemorrhoids rather than the treated one.  Dont be concerned if there is a tablespoon or so of blood.  If there is more blood than this, lie flat with your bottom higher than your head and apply an ice pack to the area. If the bleeding does not stop within a half an hour or if you feel faint, call our office at [phone] or go to the emergency room.  Problems are not common; however, if there is a substantial amount of bleeding, severe pain, chills, fever or difficulty passing urine (very rare) or other problems, you should call us  at [phone] or report to the nearest emergency room.  Do not stay seated continuously for more than 2-3 hours for a day or two after the procedure.   Tighten your buttock muscles 10-15 times every two hours and take 10-15 deep breaths every 1-2 hours.  Do not spend more than a few minutes on the toilet if you cannot empty your bowel; instead re-visit the toilet at a later time.    Thank you for entrusting me with your care and for choosing [facility], Dr. Estefana Kidney  _______________________________________________________  If your blood pressure at your visit was 140/90 or greater, please contact your primary care physician to follow up on this.  _______________________________________________________  If you are age 58 or older, your body mass index should be between 23-30. Your Body mass index is 26.24 kg/m. If this is out of the aforementioned range listed, please consider follow up with your Primary Care Provider.  If you are age 58 or younger, your body mass index should be between 19-25. Your Body mass index is 26.24 kg/m. If this is out of the aformentioned range listed, please consider follow up with your Primary Care Provider.   ________________________________________________________  The [facility] providers would like to encourage you to use MYCHART to communicate with providers for non-urgent requests or questions.  Due to long hold times on the telephone, sending your provider a message by [facility] may be a faster and more efficient way to get a response.  Please allow 48 business hours for a response.  Please remember that this is for non-urgent requests.  _______________________________________________________  [facility]  is using a team-based approach to care.  Your team is made up of your doctor and two to three APPS. Our APPS (Nurse Practitioners and Physician Assistants) work with your physician to ensure care continuity for you. They are fully qualified to address your health concerns and develop a treatment plan. They communicate directly with your gastroenterologist to care for you. Seeing  the Advanced Practice Practitioners on your physician's team can help you by facilitating care more promptly, often allowing for earlier appointments, access to diagnostic testing, procedures, and other specialty referrals.

## 2024-10-30 NOTE — Progress Notes (Signed)
 PROCEDURE NOTE: The patient presents with symptomatic grade 2  hemorrhoids, requesting rubber band ligation of his/her hemorrhoidal disease.  All risks, benefits and alternative forms of therapy were described and informed consent was obtained.  In the Left Lateral Decubitus position anoscopic examination revealed grade 2 hemorrhoids  The anorectum was pre-medicated with Recticare and nitroglycerin The decision was made to band the posterior internal hemorrhoid, and the [facility] ORegan System was used to perform band ligation without complication.  Digital anorectal examination was then performed to assure proper positioning of the band, and to adjust the banded tissue as required.  The patient was discharged home without pain or other issues.  Dietary and behavioral recommendations were given and along with follow-up instructions.     The following adjunctive treatments were recommended: Patient requested famotidine  to help with acid reflux  The patient will return PRN for follow up No complications were encountered and the patient tolerated the procedure well.

## 2024-11-06 ENCOUNTER — Other Ambulatory Visit: Payer: Self-pay | Admitting: Internal Medicine

## 2024-11-12 ENCOUNTER — Ambulatory Visit: Admitting: Family

## 2024-11-20 ENCOUNTER — Other Ambulatory Visit: Payer: Self-pay | Admitting: Medical Genetics

## 2024-11-20 DIAGNOSIS — Z006 Encounter for examination for normal comparison and control in clinical research program: Secondary | ICD-10-CM
